# Patient Record
Sex: Male | Born: 1950 | Race: Black or African American | Hispanic: No | Marital: Married | Smoking: Former smoker
Health system: Southern US, Community
[De-identification: ages and names within clinical notes are randomized; demographics above are authoritative.]

## PROBLEM LIST (undated history)

## (undated) DIAGNOSIS — Z9989 Dependence on other enabling machines and devices: Secondary | ICD-10-CM

## (undated) DIAGNOSIS — R2681 Unsteadiness on feet: Secondary | ICD-10-CM

## (undated) DIAGNOSIS — K635 Polyp of colon: Secondary | ICD-10-CM

## (undated) DIAGNOSIS — M21379 Foot drop, unspecified foot: Secondary | ICD-10-CM

## (undated) DIAGNOSIS — M4800 Spinal stenosis, site unspecified: Secondary | ICD-10-CM

## (undated) DIAGNOSIS — I7300 Raynaud's syndrome without gangrene: Secondary | ICD-10-CM

## (undated) DIAGNOSIS — E669 Obesity, unspecified: Secondary | ICD-10-CM

## (undated) DIAGNOSIS — N529 Male erectile dysfunction, unspecified: Secondary | ICD-10-CM

## (undated) DIAGNOSIS — I639 Cerebral infarction, unspecified: Secondary | ICD-10-CM

## (undated) DIAGNOSIS — Z8669 Personal history of other diseases of the nervous system and sense organs: Secondary | ICD-10-CM

## (undated) DIAGNOSIS — I82409 Acute embolism and thrombosis of unspecified deep veins of unspecified lower extremity: Secondary | ICD-10-CM

## (undated) DIAGNOSIS — I77811 Abdominal aortic ectasia: Secondary | ICD-10-CM

## (undated) DIAGNOSIS — I10 Essential (primary) hypertension: Secondary | ICD-10-CM

## (undated) DIAGNOSIS — E785 Hyperlipidemia, unspecified: Secondary | ICD-10-CM

## (undated) DIAGNOSIS — M47812 Spondylosis without myelopathy or radiculopathy, cervical region: Secondary | ICD-10-CM

## (undated) DIAGNOSIS — J984 Other disorders of lung: Secondary | ICD-10-CM

## (undated) DIAGNOSIS — G4733 Obstructive sleep apnea (adult) (pediatric): Secondary | ICD-10-CM

## (undated) DIAGNOSIS — R42 Dizziness and giddiness: Secondary | ICD-10-CM

## (undated) DIAGNOSIS — M1990 Unspecified osteoarthritis, unspecified site: Secondary | ICD-10-CM

## (undated) HISTORY — DX: Polyp of colon: K63.5

## (undated) HISTORY — DX: Abdominal aortic ectasia: I77.811

## (undated) HISTORY — DX: Personal history of other diseases of the nervous system and sense organs: Z86.69

## (undated) HISTORY — DX: Dizziness and giddiness: R42

## (undated) HISTORY — DX: Male erectile dysfunction, unspecified: N52.9

## (undated) HISTORY — DX: Spinal stenosis, site unspecified: M48.00

## (undated) HISTORY — PX: CARPAL TUNNEL RELEASE: SHX101

## (undated) HISTORY — DX: Raynaud's syndrome without gangrene: I73.00

## (undated) HISTORY — DX: Hyperlipidemia, unspecified: E78.5

## (undated) HISTORY — DX: Spondylosis without myelopathy or radiculopathy, cervical region: M47.812

## (undated) HISTORY — DX: Unsteadiness on feet: R26.81

## (undated) HISTORY — DX: Cerebral infarction, unspecified: I63.9

## (undated) HISTORY — DX: Other disorders of lung: J98.4

## (undated) HISTORY — DX: Obesity, unspecified: E66.9

## (undated) HISTORY — DX: Essential (primary) hypertension: I10

## (undated) HISTORY — DX: Dependence on other enabling machines and devices: Z99.89

## (undated) HISTORY — PX: LUMBAR DISC SURGERY: SHX700

## (undated) HISTORY — DX: Obstructive sleep apnea (adult) (pediatric): G47.33

## (undated) HISTORY — DX: Foot drop, unspecified foot: M21.379

## (undated) HISTORY — DX: Unspecified osteoarthritis, unspecified site: M19.90

---

## 2002-04-25 HISTORY — PX: CERVICAL DISCECTOMY: SHX98

## 2006-09-29 ENCOUNTER — Encounter: Admission: RE | Admit: 2006-09-29 | Discharge: 2006-09-29 | Payer: Self-pay | Admitting: Internal Medicine

## 2006-10-18 ENCOUNTER — Encounter: Admission: RE | Admit: 2006-10-18 | Discharge: 2006-10-18 | Payer: Self-pay | Admitting: Specialist

## 2008-04-25 DIAGNOSIS — I639 Cerebral infarction, unspecified: Secondary | ICD-10-CM

## 2008-04-25 HISTORY — DX: Cerebral infarction, unspecified: I63.9

## 2008-07-03 ENCOUNTER — Encounter: Payer: Self-pay | Admitting: Pulmonary Disease

## 2009-05-15 ENCOUNTER — Encounter: Admission: RE | Admit: 2009-05-15 | Discharge: 2009-05-15 | Payer: Self-pay | Admitting: Sports Medicine

## 2009-07-06 DIAGNOSIS — G629 Polyneuropathy, unspecified: Secondary | ICD-10-CM | POA: Insufficient documentation

## 2009-09-01 ENCOUNTER — Encounter: Payer: Self-pay | Admitting: Pulmonary Disease

## 2009-09-14 ENCOUNTER — Encounter: Payer: Self-pay | Admitting: Pulmonary Disease

## 2009-09-14 ENCOUNTER — Ambulatory Visit: Admission: RE | Admit: 2009-09-14 | Discharge: 2009-09-14 | Payer: Self-pay | Admitting: Internal Medicine

## 2009-09-18 ENCOUNTER — Encounter: Admission: RE | Admit: 2009-09-18 | Discharge: 2009-09-18 | Payer: Self-pay | Admitting: Internal Medicine

## 2009-09-18 ENCOUNTER — Encounter: Payer: Self-pay | Admitting: Pulmonary Disease

## 2009-09-22 ENCOUNTER — Ambulatory Visit (HOSPITAL_COMMUNITY): Admission: RE | Admit: 2009-09-22 | Discharge: 2009-09-22 | Payer: Self-pay | Admitting: Cardiology

## 2009-09-22 ENCOUNTER — Encounter: Payer: Self-pay | Admitting: Pulmonary Disease

## 2009-10-05 DIAGNOSIS — D126 Benign neoplasm of colon, unspecified: Secondary | ICD-10-CM | POA: Insufficient documentation

## 2009-10-05 DIAGNOSIS — E1129 Type 2 diabetes mellitus with other diabetic kidney complication: Secondary | ICD-10-CM | POA: Insufficient documentation

## 2009-10-05 DIAGNOSIS — E785 Hyperlipidemia, unspecified: Secondary | ICD-10-CM | POA: Insufficient documentation

## 2009-10-05 DIAGNOSIS — E119 Type 2 diabetes mellitus without complications: Secondary | ICD-10-CM | POA: Insufficient documentation

## 2009-10-05 DIAGNOSIS — N529 Male erectile dysfunction, unspecified: Secondary | ICD-10-CM | POA: Insufficient documentation

## 2009-10-05 DIAGNOSIS — I10 Essential (primary) hypertension: Secondary | ICD-10-CM | POA: Insufficient documentation

## 2009-10-05 DIAGNOSIS — E1169 Type 2 diabetes mellitus with other specified complication: Secondary | ICD-10-CM | POA: Insufficient documentation

## 2009-10-05 DIAGNOSIS — G510 Bell's palsy: Secondary | ICD-10-CM | POA: Insufficient documentation

## 2009-10-05 DIAGNOSIS — I6350 Cerebral infarction due to unspecified occlusion or stenosis of unspecified cerebral artery: Secondary | ICD-10-CM

## 2009-10-06 ENCOUNTER — Ambulatory Visit: Payer: Self-pay | Admitting: Pulmonary Disease

## 2009-10-06 DIAGNOSIS — R0989 Other specified symptoms and signs involving the circulatory and respiratory systems: Secondary | ICD-10-CM

## 2009-10-06 DIAGNOSIS — R0609 Other forms of dyspnea: Secondary | ICD-10-CM | POA: Insufficient documentation

## 2009-10-06 DIAGNOSIS — R930 Abnormal findings on diagnostic imaging of skull and head, not elsewhere classified: Secondary | ICD-10-CM | POA: Insufficient documentation

## 2009-10-06 LAB — CONVERTED CEMR LAB: Angiotensin 1 Converting Enzyme: 62 units/L (ref 9–67)

## 2009-10-08 LAB — CONVERTED CEMR LAB: Sed Rate: 22 mm/hr (ref 0–22)

## 2009-12-04 ENCOUNTER — Encounter: Payer: Self-pay | Admitting: Pulmonary Disease

## 2009-12-08 ENCOUNTER — Telehealth: Payer: Self-pay | Admitting: Pulmonary Disease

## 2010-05-26 NOTE — Progress Notes (Signed)
Summary: nos appt  Phone Note Call from Patient   Caller: [email or web address]  Call For: clance Summary of Call: LMTCB x2 to rsc nos from 8/15. Initial call taken by: Darletta Moll,  December 08, 2009 4:15 PM

## 2010-05-26 NOTE — Cardiovascular Report (Signed)
Summary: [facility]   Imported By: Sherian Rein 10/21/2009 08:59:39  _____________________________________________________________________  External Attachment:    Type:   Image     Comment:   External Document

## 2010-05-26 NOTE — Assessment & Plan Note (Signed)
Summary: consult for dyspnea, abnormal ct chest.   Copy to:  Buren Kos Primary Provider/Referring Provider:  Buren Kos  CC:  Pulmonary Consult.  History of Present Illness: The pt is a 59y/o male who I have been asked to see for dyspnea.  He notes sob since April that he feels came on fairly quickly.  It can occur with exertional activities, and on occasion at rest.  It has not interfered with his ADL's or other activities, but he notices it during that time.  He also has had chest pressure as well.  He does not feel the symptoms are progressive, and in fact have actually become more intermittant.  He has signficantly decreased his smoking since the problem started.  He has had a cath with normal coronaries and nl LV, and echo has shown only moderate LVH.  He has had pfts which revealed no obstructive disease, mild restriction, and a moderate decrease in DLCO that corrects to normal with Av.  He has had a ct chest which revealed a few patchy areas of GG density that are vague, and primarily within the upper lung zones.  There was also bronchial wall thickening.  The pt denies any signficant cough or mucus, and is not congested.  He denies any occupational exposures, unusual pets or birds, or unusual hobbies.  He has had no organic material exposure.  He has no h/o sarcoidosis.    Preventive Screening-Counseling & Management  Alcohol-Tobacco     Smoking Status: current  Current Medications (verified): 1)  Toprol Xl 50 Mg Xr24h-Tab (Metoprolol Succinate) .... Take 1 Tablet By Mouth Once A Day 2)  Furosemide 40 Mg Tabs (Furosemide) .... Take 1 Tablet By Mouth Once A Day 3)  Metformin Hcl 500 Mg Tabs (Metformin Hcl) .... Take 1 Tablet By Mouth Two Times A Day 4)  Vicodin 5-500 Mg Tabs (Hydrocodone-Acetaminophen) .... As Needed 5)  Aspirin Ec Low Dose 81 Mg Tbec (Aspirin) .... 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 mother: DM, alzheimers father: lung disease sister: multiple sclerosis brother: osa  Social History: Reviewed history and no changes required. Patient is a current smoker. started in 57s.  currently smokes 4 to 5 cigs per day. pt is married. pt has children. pt is retired from telephone company Smoking Status:  current  Review of Systems       The patient complains of shortness of breath with activity, shortness of breath at rest, difficulty swallowing, and tooth/dental problems.  The patient denies productive cough, non-productive cough, coughing up blood, chest pain, irregular heartbeats, acid heartburn, indigestion, loss of appetite, weight change, abdominal pain, sore throat, headaches, nasal congestion/difficulty breathing through nose, sneezing, itching, ear ache, anxiety, depression, hand/feet swelling, joint stiffness or pain, rash, change in color of mucus, and fever.    Vital Signs:  Patient profile:   60 year old male Height:      73.5 inches Weight:      295 pounds BMI:     38.53 O2 Sat:      100 % on Room air Temp:     98.1 degrees F oral Pulse rate:  69 / minute BP sitting:   108 / 70  (left arm) Cuff size:   regular  Vitals Entered By: Arman Filter LPN (October 06, 2009 10:34 AM)  O2 Flow:  Room air CC: Pulmonary Consult Comments Medications reviewed with patient. Aundra Millet Reynolds LPN  October 06, 2009 10:44 AM    Physical Exam  General:  obese male in nad Eyes:  PERRLA and EOMI.   Nose:  patent without discharge turbinate hypertrophy Mouth:  clear Neck:  no jvd, tmg, LN Lungs:  totally clear to  auscultation Heart:  rrr, no mrg Abdomen:  soft and nontender, bs+ Extremities:  no edema noted or cyanosis pulses intact distally Neurologic:  alert and oriented, moves all 4   Impression & Recommendations:  Problem # 1:  DYSPNEA ON EXERTION (ICD-786.09) The pt has significant dyspnea that I think is multifactorial.  He is obese and deconditioned, and this could explain his decreased TLC and DLCO on pfts.  Certainly his CT findings contribute to this as well.  I have asked him to work on weight loss and condtioning, and to stay 100% away from cigarettes.  Will follow him for now, but he is to call if symptoms are worsening.  Problem # 2:  CT, CHEST, ABNORMAL (ICD-793.1) the pt has vague GG densities primarily in the upper lung zones that are patchy, and I suspect are inflammatory in nature.  He also has some bronchial wall thickening, but no ISLD or fibrosis.  These are nonspecific findings, and could be related to may different inflammatory conditions. These can include BOOP, RBILD, sarcoid, eosinophilic pna, HP, etc....  The pt feels that his breathing has actually improved since significantly reducing his smoking, and his saturations today are excellent.  Because he is not acutely ill, and he feels things are improving, I would like to hold off on any intervention and see how he does.  I will check ESR and ACE, and have stressed to him the need to totally quit smoking.  He is agreeable to this approach.  Medications Added to Medication List This Visit: 1)  Metformin Hcl 500 Mg Tabs (Metformin hcl) .... Take 1 tablet by mouth two times a day 2)  Vicodin 5-500 Mg Tabs (Hydrocodone-acetaminophen) .... As needed 3)  Aspirin Ec Low Dose 81 Mg Tbec (Aspirin) .... Take 1 tablet by mouth once a day  Other Orders: Consultation Level V (09811) T-Angiotensin i-Converting Enzyme (91478-29562) TLB-Sedimentation Rate (ESR) (85652-ESR)  Patient Instructions: 1)  will check bloodwork to evaluate for  "inflammatory lung disease" 2)  stop smoking 100% 3)  followup with me in 8wks where we will check f/u cxr, but call if symptoms are worsening.

## 2010-05-26 NOTE — Miscellaneous (Signed)
Summary: Orders Update  Clinical Lists Changes  Orders: Added new Test order of T-2 View CXR (71020TC) - Signed 

## 2010-07-12 LAB — POCT I-STAT 3, VENOUS BLOOD GAS (G3P V)
Acid-Base Excess: 1 mmol/L (ref 0.0–2.0)
Bicarbonate: 27 mEq/L — ABNORMAL HIGH (ref 20.0–24.0)
O2 Saturation: 68 %
pCO2, Ven: 48 mmHg (ref 45.0–50.0)
pH, Ven: 7.359 — ABNORMAL HIGH (ref 7.250–7.300)
pO2, Ven: 38 mmHg (ref 30.0–45.0)

## 2011-01-17 ENCOUNTER — Other Ambulatory Visit: Payer: Self-pay | Admitting: Internal Medicine

## 2011-01-17 DIAGNOSIS — J984 Other disorders of lung: Secondary | ICD-10-CM

## 2011-01-17 DIAGNOSIS — M545 Low back pain, unspecified: Secondary | ICD-10-CM | POA: Insufficient documentation

## 2011-01-21 ENCOUNTER — Ambulatory Visit (HOSPITAL_COMMUNITY)
Admission: RE | Admit: 2011-01-21 | Discharge: 2011-01-21 | Disposition: A | Discharge status: Home / Self Care | Payer: Medicare Other | Source: Ambulatory Visit | Attending: Internal Medicine | Admitting: Internal Medicine

## 2011-01-21 ENCOUNTER — Ambulatory Visit
Admission: RE | Admit: 2011-01-21 | Discharge: 2011-01-21 | Disposition: A | Discharge status: Home / Self Care | Payer: Medicare Other | Source: Ambulatory Visit | Attending: Internal Medicine | Admitting: Internal Medicine

## 2011-01-21 DIAGNOSIS — J984 Other disorders of lung: Secondary | ICD-10-CM | POA: Insufficient documentation

## 2011-03-29 ENCOUNTER — Encounter: Payer: Self-pay | Admitting: Internal Medicine

## 2011-03-31 ENCOUNTER — Other Ambulatory Visit: Payer: Medicare Other | Admitting: Internal Medicine

## 2011-04-22 ENCOUNTER — Ambulatory Visit (AMBULATORY_SURGERY_CENTER): Payer: Medicare Other | Admitting: *Deleted

## 2011-04-22 VITALS — Ht 76.0 in | Wt 304.2 lb

## 2011-04-22 DIAGNOSIS — Z1211 Encounter for screening for malignant neoplasm of colon: Secondary | ICD-10-CM

## 2011-04-22 MED ORDER — PEG-KCL-NACL-NASULF-NA ASC-C 100 G PO SOLR: ORAL | Status: DC

## 2011-04-22 NOTE — Progress Notes (Signed)
Pt had screening colonoscopy 2006 in [place], [place].  Received records from that procedure and will give to Alonna Buckler, CMA for Dr. Marina Goodell to review.  Rodney Crane

## 2011-05-03 ENCOUNTER — Ambulatory Visit (AMBULATORY_SURGERY_CENTER): Payer: Medicare Other | Admitting: Internal Medicine

## 2011-05-03 ENCOUNTER — Encounter: Payer: Self-pay | Admitting: Internal Medicine

## 2011-05-03 VITALS — BP 134/88 | HR 59 | Temp 96.1°F | Resp 18 | Ht 76.0 in | Wt 304.0 lb

## 2011-05-03 DIAGNOSIS — Z86010 Personal history of colonic polyps: Secondary | ICD-10-CM

## 2011-05-03 DIAGNOSIS — Z1211 Encounter for screening for malignant neoplasm of colon: Secondary | ICD-10-CM

## 2011-05-03 DIAGNOSIS — D126 Benign neoplasm of colon, unspecified: Secondary | ICD-10-CM

## 2011-05-03 DIAGNOSIS — F411 Generalized anxiety disorder: Secondary | ICD-10-CM | POA: Diagnosis not present

## 2011-05-03 DIAGNOSIS — K635 Polyp of colon: Secondary | ICD-10-CM

## 2011-05-03 LAB — GLUCOSE, CAPILLARY: Glucose-Capillary: 153 mg/dL — ABNORMAL HIGH (ref 70–99)

## 2011-05-03 MED ORDER — SODIUM CHLORIDE 0.9 % IV SOLN: 500.0000 mL | INTRAVENOUS | Status: DC

## 2011-05-03 NOTE — Op Note (Signed)
[facility] [address] [place], [place]  [address]  COLONOSCOPY PROCEDURE REPORT  PATIENT:  Rodney Crane, Rodney Crane  MR#:  [id number] BIRTHDATE:  10-23-1950, 60 yrs. old  GENDER:  male ENDOSCOPIST:  Wilhemina Bonito. Eda Keys, MD REF. BY:  Kari Baars, M.D. PROCEDURE DATE:  05/03/2011 PROCEDURE:  Colonoscopy with snare polypectomy x 6 ASA CLASS:  Class II INDICATIONS:  history of pre-cancerous (adenomatous) colon polyps, surveillance and high-risk screening ; index exam [name], [place] [phone] w/ TA MEDICATIONS:   MAC sedation, administered by CRNA, propofol (Diprivan) 300 mg IV  DESCRIPTION OF PROCEDURE:   After the risks benefits and alternatives of the procedure were thoroughly explained, informed consent was obtained.  Digital rectal exam was performed and revealed no abnormalities.   The [id number] endoscope was introduced through the anus and advanced to the cecum, which was identified by both the appendix and ileocecal valve, without limitations.  The quality of the prep was excellent, using MoviPrep.  The instrument was then slowly withdrawn as the colon was fully examined. <<PROCEDUREIMAGES>>  FINDINGS: There were multiple polyps, all < 5mm, identified and removed -cecal (1); hepatic flexure (2); transverse (2); and sigmoid Polyps were snared without cautery. Retrieval was successful in 5/6. Mild diverticulosis was found in the sigmoid colon. Otherwise normal colonoscopy without other polyps, masses, vascular ectasias, or inflammatory changes.   Retroflexed views in the rectum revealed no abnormalities. The time to cecum = 5:15 minutes. The scope was then withdrawn in 15:07 minutes from the cecum and the procedure completed.  COMPLICATIONS:  None  ENDOSCOPIC IMPRESSION: 1) Polyps, multiple small - removed 2) Mild diverticulosis in the sigmoid colon 3) Otherwise normal colonoscopy  RECOMMENDATIONS: 1) Follow up colonoscopy in 5  years  ______________________________ Wilhemina Bonito. Eda Keys, MD  CC:  Kari Baars, MD;  The Patient  n. eSIGNED:   Wilhemina Bonito. Eda Keys at 05/03/2011 10:24 AM  Karsten Fells, [phone]

## 2011-05-03 NOTE — Progress Notes (Signed)
Patient did not experience any of the following events: a burn prior to discharge; a fall within the facility; wrong site/side/patient/procedure/implant event; or a hospital transfer or hospital admission upon discharge from the facility. (G8907) Patient did not have preoperative order for IV antibiotic SSI prophylaxis. (G8918)  

## 2011-05-03 NOTE — Patient Instructions (Signed)
Polyps removed today. You will receive a letter from Dr. Marina Goodell in 7-10days (usually); if you do not by 3 weeks please call us at [phone].

## 2011-05-04 ENCOUNTER — Telehealth: Payer: Self-pay | Admitting: *Deleted

## 2011-05-04 NOTE — Telephone Encounter (Signed)

## 2011-05-09 ENCOUNTER — Encounter: Payer: Self-pay | Admitting: Internal Medicine

## 2011-06-01 DIAGNOSIS — I10 Essential (primary) hypertension: Secondary | ICD-10-CM | POA: Diagnosis not present

## 2011-06-01 DIAGNOSIS — E1149 Type 2 diabetes mellitus with other diabetic neurological complication: Secondary | ICD-10-CM | POA: Diagnosis not present

## 2011-06-01 DIAGNOSIS — E785 Hyperlipidemia, unspecified: Secondary | ICD-10-CM | POA: Diagnosis not present

## 2011-06-01 DIAGNOSIS — G589 Mononeuropathy, unspecified: Secondary | ICD-10-CM | POA: Diagnosis not present

## 2011-12-16 DIAGNOSIS — E1149 Type 2 diabetes mellitus with other diabetic neurological complication: Secondary | ICD-10-CM | POA: Diagnosis not present

## 2011-12-16 DIAGNOSIS — E785 Hyperlipidemia, unspecified: Secondary | ICD-10-CM | POA: Diagnosis not present

## 2011-12-16 DIAGNOSIS — G589 Mononeuropathy, unspecified: Secondary | ICD-10-CM | POA: Diagnosis not present

## 2011-12-16 DIAGNOSIS — I10 Essential (primary) hypertension: Secondary | ICD-10-CM | POA: Diagnosis not present

## 2012-02-16 DIAGNOSIS — Z23 Encounter for immunization: Secondary | ICD-10-CM | POA: Diagnosis not present

## 2012-02-24 DIAGNOSIS — E1149 Type 2 diabetes mellitus with other diabetic neurological complication: Secondary | ICD-10-CM | POA: Diagnosis not present

## 2012-02-24 DIAGNOSIS — Z125 Encounter for screening for malignant neoplasm of prostate: Secondary | ICD-10-CM | POA: Diagnosis not present

## 2012-02-24 DIAGNOSIS — I10 Essential (primary) hypertension: Secondary | ICD-10-CM | POA: Diagnosis not present

## 2012-02-24 DIAGNOSIS — E785 Hyperlipidemia, unspecified: Secondary | ICD-10-CM | POA: Diagnosis not present

## 2012-03-09 ENCOUNTER — Encounter: Payer: Self-pay | Admitting: Cardiology

## 2012-03-16 DIAGNOSIS — I10 Essential (primary) hypertension: Secondary | ICD-10-CM | POA: Diagnosis not present

## 2012-03-16 DIAGNOSIS — E1149 Type 2 diabetes mellitus with other diabetic neurological complication: Secondary | ICD-10-CM | POA: Diagnosis not present

## 2012-03-16 DIAGNOSIS — E785 Hyperlipidemia, unspecified: Secondary | ICD-10-CM | POA: Diagnosis not present

## 2012-03-16 DIAGNOSIS — Z0000 Encounter for general adult medical examination without abnormal findings: Secondary | ICD-10-CM | POA: Diagnosis not present

## 2012-03-16 DIAGNOSIS — Z87891 Personal history of nicotine dependence: Secondary | ICD-10-CM | POA: Diagnosis not present

## 2012-03-16 DIAGNOSIS — Z125 Encounter for screening for malignant neoplasm of prostate: Secondary | ICD-10-CM | POA: Diagnosis not present

## 2012-03-16 DIAGNOSIS — G589 Mononeuropathy, unspecified: Secondary | ICD-10-CM | POA: Diagnosis not present

## 2012-04-10 DIAGNOSIS — E119 Type 2 diabetes mellitus without complications: Secondary | ICD-10-CM | POA: Diagnosis not present

## 2012-04-10 DIAGNOSIS — H04129 Dry eye syndrome of unspecified lacrimal gland: Secondary | ICD-10-CM | POA: Diagnosis not present

## 2012-04-10 DIAGNOSIS — H2510 Age-related nuclear cataract, unspecified eye: Secondary | ICD-10-CM | POA: Diagnosis not present

## 2012-04-10 DIAGNOSIS — H35039 Hypertensive retinopathy, unspecified eye: Secondary | ICD-10-CM | POA: Diagnosis not present

## 2012-06-14 ENCOUNTER — Encounter: Payer: Self-pay | Admitting: Cardiology

## 2012-07-10 DIAGNOSIS — M2040 Other hammer toe(s) (acquired), unspecified foot: Secondary | ICD-10-CM | POA: Diagnosis not present

## 2012-07-10 DIAGNOSIS — B351 Tinea unguium: Secondary | ICD-10-CM | POA: Diagnosis not present

## 2012-07-10 DIAGNOSIS — M79609 Pain in unspecified limb: Secondary | ICD-10-CM | POA: Diagnosis not present

## 2012-07-30 DIAGNOSIS — R209 Unspecified disturbances of skin sensation: Secondary | ICD-10-CM | POA: Diagnosis not present

## 2012-07-30 DIAGNOSIS — E669 Obesity, unspecified: Secondary | ICD-10-CM | POA: Diagnosis not present

## 2012-07-30 DIAGNOSIS — E1149 Type 2 diabetes mellitus with other diabetic neurological complication: Secondary | ICD-10-CM | POA: Diagnosis not present

## 2012-07-30 DIAGNOSIS — I10 Essential (primary) hypertension: Secondary | ICD-10-CM | POA: Diagnosis not present

## 2012-07-30 DIAGNOSIS — R404 Transient alteration of awareness: Secondary | ICD-10-CM | POA: Diagnosis not present

## 2012-07-30 DIAGNOSIS — E785 Hyperlipidemia, unspecified: Secondary | ICD-10-CM | POA: Diagnosis not present

## 2012-08-06 DIAGNOSIS — I6789 Other cerebrovascular disease: Secondary | ICD-10-CM | POA: Diagnosis not present

## 2012-08-06 DIAGNOSIS — R209 Unspecified disturbances of skin sensation: Secondary | ICD-10-CM | POA: Diagnosis not present

## 2012-08-28 DIAGNOSIS — M961 Postlaminectomy syndrome, not elsewhere classified: Secondary | ICD-10-CM | POA: Diagnosis not present

## 2012-09-29 DIAGNOSIS — M961 Postlaminectomy syndrome, not elsewhere classified: Secondary | ICD-10-CM | POA: Diagnosis not present

## 2012-10-09 DIAGNOSIS — M961 Postlaminectomy syndrome, not elsewhere classified: Secondary | ICD-10-CM | POA: Diagnosis not present

## 2012-10-23 DIAGNOSIS — M961 Postlaminectomy syndrome, not elsewhere classified: Secondary | ICD-10-CM | POA: Diagnosis not present

## 2012-10-31 DIAGNOSIS — M961 Postlaminectomy syndrome, not elsewhere classified: Secondary | ICD-10-CM | POA: Diagnosis not present

## 2012-12-19 DIAGNOSIS — M961 Postlaminectomy syndrome, not elsewhere classified: Secondary | ICD-10-CM | POA: Diagnosis not present

## 2013-02-12 DIAGNOSIS — Z23 Encounter for immunization: Secondary | ICD-10-CM | POA: Diagnosis not present

## 2013-02-14 DIAGNOSIS — M961 Postlaminectomy syndrome, not elsewhere classified: Secondary | ICD-10-CM | POA: Diagnosis not present

## 2013-03-06 DIAGNOSIS — M961 Postlaminectomy syndrome, not elsewhere classified: Secondary | ICD-10-CM | POA: Diagnosis not present

## 2013-04-12 ENCOUNTER — Ambulatory Visit (INDEPENDENT_AMBULATORY_CARE_PROVIDER_SITE_OTHER): Payer: Medicare Other | Admitting: Neurology

## 2013-04-12 ENCOUNTER — Encounter (INDEPENDENT_AMBULATORY_CARE_PROVIDER_SITE_OTHER): Payer: Self-pay

## 2013-04-12 ENCOUNTER — Encounter: Payer: Self-pay | Admitting: Neurology

## 2013-04-12 VITALS — BP 190/99 | HR 68 | Temp 96.9°F | Ht 73.0 in | Wt 307.0 lb

## 2013-04-12 DIAGNOSIS — E119 Type 2 diabetes mellitus without complications: Secondary | ICD-10-CM

## 2013-04-12 DIAGNOSIS — G4733 Obstructive sleep apnea (adult) (pediatric): Secondary | ICD-10-CM | POA: Diagnosis not present

## 2013-04-12 DIAGNOSIS — G609 Hereditary and idiopathic neuropathy, unspecified: Secondary | ICD-10-CM

## 2013-04-12 DIAGNOSIS — G8929 Other chronic pain: Secondary | ICD-10-CM

## 2013-04-12 DIAGNOSIS — M545 Low back pain: Secondary | ICD-10-CM

## 2013-04-12 DIAGNOSIS — Z8673 Personal history of transient ischemic attack (TIA), and cerebral infarction without residual deficits: Secondary | ICD-10-CM

## 2013-04-12 NOTE — Progress Notes (Signed)
Subjective:    Patient ID: Rodney Crane is a 62 y.o. male.  HPI    Rodney Foley, MD, PhD [facility] [address] [address] [place], [place] [address]   Dear Dr. Clelia Croft,   I saw your patient, Rodney Crane, upon your kind request in my neurologic clinic today for initial consultation of his sleep disorder, in particular, concern for structural sleep apnea. The patient is accompanied by his wife today. As you know, Rodney Crane is a very friendly 62 year old right-handed gentleman with an underlying medical history of hypertension, hyperlipidemia, type 2 diabetes, degenerative back disease, s/p 7 back surgeris, and stroke (incidental finding on an MRI per patient), who complains of snoring and excessive daytime somnolence. He still smokes about 3 cig/day.  His typical bedtime is reported to be around 11:30 to MN and usual wake time is around 7 AM. Sleep onset typically occurs within a few minutes. He reports feeling marginally rested upon awakening. He wakes up on an average 3 times in the middle of the night and has to go to the bathroom 0 to 1 times on a typical night. He admits to occasional morning headaches. This is a dull, achy bifrontal, facial headache.   He reports excessive daytime somnolence (EDS) and His Epworth Sleepiness Score (ESS) is 5/24 today. He has not fallen asleep while driving. The patient has not been taking a planned nap.  He has been known to snore for the past many years. Snoring is reportedly moderate, and associated with choking sounds and witnessed apneas. The patient admits to a sense of choking or strangling feeling. There is no report of nighttime reflux, with rare nighttime cough experienced. The patient has not noted any RLS symptoms and is not known to kick while asleep or before falling asleep. There is family history of OSA in his brother.  He is a restless sleeper and in the morning, the bed is quite disheveled.   He denies  cataplexy, sleep paralysis, hypnagogic or hypnopompic hallucinations, or sleep attacks. He does not report any vivid dreams, nightmares, dream enactments, or parasomnias, such as sleep talking or sleep walking. The patient has not had a sleep study or a home sleep test.  He consumes 2 caffeinated beverages per day, usually in the form of coffee in the mornings.   His bedroom is usually dark and cool. There is a TV in the bedroom and usually it is not on at night.   His Past Medical History Is Significant For: Past Medical History  Diagnosis Date  . Arthritis   . Diabetes mellitus   . Hyperlipidemia   . Hypertension   . CVA (cerebral infarction) 2010    His Past Surgical History Is Significant For: Past Surgical History  Procedure Laterality Date  . Lumbar disc surgery      x6  . Cervical discectomy  2004  . Carpal tunnel release      bilateral    His Family History Is Significant For: Family History  Problem Relation Age of Onset  . Colon cancer Neg Hx   . Stomach cancer Neg Hx   . Multiple sclerosis Sister   . Dementia Mother   . Cancer Father     His Social History Is Significant For: History   Social History  . Marital Status: Married    Spouse Name: N/A    Number of Children: N/A  . Years of Education: N/A   Social History Main Topics  . Smoking  status: Former Smoker    Quit date: 01/21/2011  . Smokeless tobacco: Never Used  . Alcohol Use: No  . Drug Use: No  . Sexual Activity: None   Other Topics Concern  . None   Social History Narrative  . None    His Allergies Are:  No Known Allergies:   His Current Medications Are:  Outpatient Encounter Prescriptions as of 04/12/2013  Medication Sig  . aspirin 81 MG tablet Take 81 mg by mouth daily.    Marland Kitchen atorvastatin (LIPITOR) 20 MG tablet Take 20 mg by mouth daily.  Marland Kitchen DIOVAN 160 MG tablet Take 160 mg by mouth daily.   . metFORMIN (GLUCOPHAGE) 500 MG tablet Take 500 mg by mouth 2 (two) times daily with a  meal.   . metoprolol (TOPROL XL) 50 MG 24 hr tablet Take 50 mg by mouth daily.    . naproxen sodium (ANAPROX) 220 MG tablet Take 220 mg by mouth 2 (two) times daily with a meal.  . pregabalin (LYRICA) 75 MG capsule Take 75 mg by mouth daily.  . traMADol (ULTRAM) 50 MG tablet Take 25 mg by mouth at bedtime.  . [DISCONTINUED] CRESTOR 10 MG tablet Take 10 mg by mouth daily. Takes 1/2 tablet daily  :  Review of Systems:  Out of a complete 14 point review of systems, all are reviewed and negative with the exception of these symptoms as listed below:   Review of Systems  HENT: Positive for trouble swallowing.   Eyes: Positive for visual disturbance (blurred).  Respiratory: Negative.   Cardiovascular: Negative.   Gastrointestinal: Positive for constipation.  Endocrine: Negative.   Genitourinary: Negative.        Impotence  Musculoskeletal: Positive for myalgias.  Skin: Negative.   Neurological: Positive for weakness and numbness.  Hematological: Negative.   Psychiatric/Behavioral: Positive for sleep disturbance (snoring).    Objective:  Neurologic Exam  Physical Exam Physical Examination:   Filed Vitals:   04/12/13 0951  BP: 190/99  Pulse: 68  Temp: 96.9 F (36.1 C)    General Examination: The patient is a very pleasant 62 y.o. male in no acute distress. He appears well-developed and well-nourished and very well groomed. He is overweight.   HEENT: Normocephalic, atraumatic, pupils are equal, round and reactive to light and accommodation. Funduscopic exam is normal with sharp disc margins noted. Extraocular tracking is good without limitation to gaze excursion or nystagmus noted. Normal smooth pursuit is noted. Hearing is grossly intact. Tympanic membranes are clear bilaterally. Face is symmetric with normal facial animation and normal facial sensation. Speech is clear with no dysarthria noted. There is no hypophonia. There is no lip, neck/head, jaw or voice tremor. Neck is supple  with full range of passive and active motion. There are no carotid bruits on auscultation. Oropharynx exam reveals: mild mouth dryness, good dental hygiene and moderate airway crowding, due to redundant soft palate and floppy, wider based uvula. Mallampati is class III. Tongue protrudes centrally and palate elevates symmetrically. Tonsils are either absent or very small. Neck size is 17 inches.   Chest: Clear to auscultation without wheezing, rhonchi or crackles noted.  Heart: S1+S2+0, regular and normal without murmurs, rubs or gallops noted.   Abdomen: Soft, non-tender and non-distended with normal bowel sounds appreciated on auscultation.  Extremities: There is no pitting edema in the distal lower extremities bilaterally. Pedal pulses are intact. His right leg is smaller in caliber than the L.   Skin: Warm and dry without trophic  changes noted. There are no varicose veins.  Musculoskeletal: exam reveals no obvious joint deformities, tenderness or joint swelling or erythema.   Neurologically:  Mental status: The patient is awake, alert and oriented in all 4 spheres. His memory, attention, language and knowledge are appropriate. There is no aphasia, agnosia, apraxia or anomia. Speech is clear with normal prosody and enunciation. Thought process is linear. Mood is congruent and affect is normal.  Cranial nerves are as described above under HEENT exam. In addition, shoulder shrug is normal with equal shoulder height noted. Motor exam: Normal bulk, strength and tone is noted, with the exception of mild weakness in the R foot dorsiflexion. There is no drift, tremor or rebound. Romberg is positive. Reflexes are 1+ throughout. Toes are downgoing bilaterally. Fine motor skills are intact with normal finger taps, normal hand movements, normal rapid alternating patting, normal foot taps and normal foot agility.  Cerebellar testing shows no dysmetria or intention tremor on finger to nose testing. Heel to  shin is unremarkable bilaterally. There is no truncal or gait ataxia.  Sensory exam is intact to light touch, pinprick, vibration, temperature sense in the upper and lower extremities, with the exception of decrease in PP and temperature sense in the RLE.  Gait, station and balance: He has loss of lumbar lordosis and has a mildly stooped posture. No veering to one side is noted. No leaning to one side is noted. Posture is age-appropriate and stance is narrow based. No problems turning are noted. He turns en bloc. Tandem walk is difficult for him. He cannot do toe or heel stance.               Assessment and Plan:   In summary, Rodney Crane is a very pleasant 62 y.o.-year old male with a history and physical exam concerning for obstructive sleep apnea (OSA). In light of his an underlying medical history of hypertension, hyperlipidemia, type 2 diabetes, degenerative back disease, s/p 7 back surgeris, and stroke and his smoking, he has several cardiovascular risk factors. He has evidence of decrease in sensation in the right lower extremity which is also smaller in size and he has noticed this since his multiple back surgeries. I had a long chat with the patient about my findings and the diagnosis, its prognosis and treatment options. We talked about medical treatments and non-pharmacological approaches. I explained in particular the risks and ramifications of untreated moderate to severe OSA, especially with respect to developing cardiovascular disease down the Road, including congestive heart failure, difficult to treat hypertension, cardiac arrhythmias, or stroke. Even type 2 diabetes has in part been linked to untreated OSA. We talked about smoking cessation and trying to maintain a healthy lifestyle in general, as well as the importance of weight control. I encouraged the patient to eat healthy, exercise daily and keep well hydrated, to keep a scheduled bedtime and wake time routine, to not skip any meals  and eat healthy snacks in between meals.  I recommended the following at this time: sleep study with potential positive airway pressure titration.  I explained the sleep test procedure to the patient and also outlined possible surgical and non-surgical treatment options of OSA, including the use of a custom-made dental device, upper airway surgical options, such as pillar implants, radiofrequency surgery, tongue base surgery, and UPPP. I also explained the CPAP treatment option to the patient, who indicated that he would be willing to try CPAP if the need arises. I explained the importance of being  compliant with PAP treatment, not only for insurance purposes but primarily to improve His symptoms, and for the patient's long term health benefit, including to reduce His cardiovascular risks. I answered all his questions today and the patient was in agreement. I would like to see him back after the sleep study is completed and encouraged him to call with any interim questions, concerns, problems or updates.   Thank you very much for allowing me to participate in the care of this nice patient. If I can be of any further assistance to you please do not hesitate to call me at [phone].  Sincerely,   Rodney Foley, MD, PhD

## 2013-04-12 NOTE — Patient Instructions (Signed)

## 2013-05-07 DIAGNOSIS — M961 Postlaminectomy syndrome, not elsewhere classified: Secondary | ICD-10-CM | POA: Diagnosis not present

## 2013-05-07 DIAGNOSIS — M545 Low back pain, unspecified: Secondary | ICD-10-CM | POA: Diagnosis not present

## 2013-05-28 ENCOUNTER — Ambulatory Visit (INDEPENDENT_AMBULATORY_CARE_PROVIDER_SITE_OTHER): Payer: Medicare Other

## 2013-05-28 DIAGNOSIS — G8929 Other chronic pain: Secondary | ICD-10-CM

## 2013-05-28 DIAGNOSIS — G479 Sleep disorder, unspecified: Secondary | ICD-10-CM | POA: Diagnosis not present

## 2013-05-28 DIAGNOSIS — G609 Hereditary and idiopathic neuropathy, unspecified: Secondary | ICD-10-CM

## 2013-05-28 DIAGNOSIS — M545 Low back pain, unspecified: Secondary | ICD-10-CM

## 2013-05-28 DIAGNOSIS — E119 Type 2 diabetes mellitus without complications: Secondary | ICD-10-CM

## 2013-05-28 DIAGNOSIS — G4733 Obstructive sleep apnea (adult) (pediatric): Secondary | ICD-10-CM | POA: Diagnosis not present

## 2013-05-31 ENCOUNTER — Telehealth: Payer: Self-pay | Admitting: Neurology

## 2013-05-31 DIAGNOSIS — G4733 Obstructive sleep apnea (adult) (pediatric): Secondary | ICD-10-CM

## 2013-05-31 NOTE — Telephone Encounter (Signed)
Please call and notify patient that the recent sleep study confirmed the diagnosis of OSA. He did well with CPAP during the study with significant improvement of the respiratory events. Therefore, I would like start the patient on CPAP at home. I placed the order in the chart.   Arrange for CPAP set up at home through a DME company of patient's choice and fax/route report to PCP and referring MD (if other than PCP).   The patient will also need a follow up appointment with me in 6-8 weeks post set up that has to be scheduled; help the patient schedule this (in a follow-up slot).   Please re-enforce the importance of compliance with treatment and the need for us to monitor compliance data.   Once you have spoken to the patient and scheduled the return appointment, you may close this encounter, thanks,   Othello Sgroi, MD, PhD [facility] ([facility])    

## 2013-06-03 ENCOUNTER — Encounter: Payer: Self-pay | Admitting: *Deleted

## 2013-06-03 NOTE — Telephone Encounter (Signed)
I called and spoke with the patient about his recent sleep study results. I informed the patient that the study confirm the diagnosis of obstructive sleep apnea, and he did well on CPAP during the night of his study. I informed the patient that Dr. Rexene Alberts recommends CPAP therapy at home and I will send his order to [facility] and they'll contact him once they receive benefits for the CPAP machine. I will fax a copy of the report to Dr. Raul Del office and mail a copy of the report along with a follow up instruction letter to the patient's home.

## 2013-06-19 ENCOUNTER — Encounter: Payer: Self-pay | Admitting: Neurology

## 2013-07-09 DIAGNOSIS — E119 Type 2 diabetes mellitus without complications: Secondary | ICD-10-CM | POA: Diagnosis not present

## 2013-07-09 DIAGNOSIS — H524 Presbyopia: Secondary | ICD-10-CM | POA: Diagnosis not present

## 2013-07-09 DIAGNOSIS — H35039 Hypertensive retinopathy, unspecified eye: Secondary | ICD-10-CM | POA: Diagnosis not present

## 2013-07-09 DIAGNOSIS — H2510 Age-related nuclear cataract, unspecified eye: Secondary | ICD-10-CM | POA: Diagnosis not present

## 2013-07-10 ENCOUNTER — Encounter: Payer: Self-pay | Admitting: Neurology

## 2013-07-10 NOTE — Progress Notes (Signed)
Quick Note:  I reviewed the patient's CPAP compliance data from 06/09/2013 to 07/08/2013, which is a total of 30 days, during which time the patient used CPAP every day except for 1 day. The average usage for all days was 7 hours and 44 minutes. The percent used days greater than 4 hours was 97 %, indicating excellent compliance. The residual AHI was 1.3 per hour, indicating an appropriate treatment pressure of 7 cwp with EPR of 3. I will review this data with the patient at the next office visit, provide feedback and additional troubleshooting if need be. He is currently scheduled to see me on 07/15/2013 at 9 AM.  Star Age, MD, PhD [facility] ([place])   ______

## 2013-07-15 ENCOUNTER — Encounter (INDEPENDENT_AMBULATORY_CARE_PROVIDER_SITE_OTHER): Payer: Self-pay

## 2013-07-15 ENCOUNTER — Encounter: Payer: Self-pay | Admitting: Neurology

## 2013-07-15 ENCOUNTER — Ambulatory Visit (INDEPENDENT_AMBULATORY_CARE_PROVIDER_SITE_OTHER): Payer: Medicare Other | Admitting: Neurology

## 2013-07-15 VITALS — BP 144/85 | HR 58 | Temp 97.6°F | Ht 74.0 in | Wt 311.0 lb

## 2013-07-15 DIAGNOSIS — E119 Type 2 diabetes mellitus without complications: Secondary | ICD-10-CM

## 2013-07-15 DIAGNOSIS — G609 Hereditary and idiopathic neuropathy, unspecified: Secondary | ICD-10-CM | POA: Diagnosis not present

## 2013-07-15 DIAGNOSIS — M545 Low back pain, unspecified: Secondary | ICD-10-CM

## 2013-07-15 DIAGNOSIS — G8929 Other chronic pain: Secondary | ICD-10-CM

## 2013-07-15 DIAGNOSIS — Z8673 Personal history of transient ischemic attack (TIA), and cerebral infarction without residual deficits: Secondary | ICD-10-CM | POA: Diagnosis not present

## 2013-07-15 DIAGNOSIS — G4733 Obstructive sleep apnea (adult) (pediatric): Secondary | ICD-10-CM

## 2013-07-15 DIAGNOSIS — E669 Obesity, unspecified: Secondary | ICD-10-CM

## 2013-07-15 NOTE — Patient Instructions (Signed)
Please continue using your CPAP regularly. While your insurance requires that you use CPAP at least 4 hours each night on 70% of the nights, I recommend, that you not skip any nights and use it throughout the night if you can. Getting used to CPAP does take time and patience and discipline. Untreated obstructive sleep apnea when it is moderate to severe can have an adverse impact on cardiovascular health and raise her risk for heart disease, arrhythmias, hypertension, congestive heart failure, stroke and diabetes. Untreated obstructive sleep apnea causes sleep disruption, nonrestorative sleep, and sleep deprivation. This can have an impact on your day to day functioning and cause daytime sleepiness and impairment of cognitive function, memory loss, mood disturbance, and problems focussing. Using CPAP regularly can improve these symptoms.  Keep up the good work! I will see you back in 6 months for sleep apnea check up, and if you continue to do well on CPAP I will see you once a year thereafter.   Please use a nasal saline spray to moisturize your nose.

## 2013-07-15 NOTE — Progress Notes (Signed)
Subjective:    Patient ID: Rodney Crane is a 63 y.o. male.  HPI   Interim history:   Rodney Crane is a very friendly 63 year old right-handed gentleman with an underlying medical history of hypertension, smoking, hyperlipidemia, type 2 diabetes, degenerative back disease, s/p 7 back surgeris, and stroke (incidental finding on an MRI per patient), who presents for followup consultation of his obstructive sleep apnea. He is unaccompanied today. The first met him on 04/12/2013, at which time he complained of snoring and excessive daytime somnolence. I suggested he have a sleep study. He had a split-night sleep study on 05/28/2013 and went over his test results with him in detail today. His baseline sleep efficiency was reduced at 68.1% with a latency to sleep of 33 minutes and wake after sleep onset of 46.5 minutes with moderate to severe sleep fragmentation noted. His arousal index was mildly elevated. He had an increased percentage of stage I and stage II sleep, absence of slow-wave sleep, and a reduced percentage of REM sleep with a mildly prolonged REM latency. He had no significant EKG changes or periodic leg movements of sleep. He had moderate to loud snoring. He had a total AHI of 15.2 per hour, rising to 38 per hour in REM sleep. His baseline oxygen saturation was 95% with a nadir of 78% during REM sleep. He was then titrated on CPAP. His sleep efficiency improved. His arousal index improved. His sleep architecture showed an increased percentage of stage II sleep, absence of slow-wave sleep and a reduced percentage of REM sleep. Average oxygen saturation was 96%, nadir was 92%, greatly improved. He was titrated on CPAP from 5-7 cm with a reduction of his AHI to 0 per hour on the final pressure. He had REM sleep but no supine sleep was achieved during this part of the study. He indicated that he does not sleep on his back. He is status post multiple back operations. Based on the test results I  prescribed CPAP for him. I  reviewed the patient's CPAP compliance data from 06/09/2013 to 07/08/2013, which is a total of 30 days, during which time the patient used CPAP every day except for 1 day. The average usage for all days was 7 hours and 44 minutes. The percent used days greater than 4 hours was 97 %, indicating excellent compliance. The residual AHI was 1.3 per hour, indicating an appropriate treatment pressure of 7 cwp with EPR of 3.  Today, I reviewed his compliance data of with his machine: 06/10/2013 through 07/14/2013 which is a total of 35 days during which time use CPAP every night with a percent used days greater than 4 hours of 100%, indicating excellent compliance, average usage was 7 hours and 47 minutes, residual AHI low at 1.3 per hour and leak was low at 1.4. Pressure was 7 cm with EPR of 3. Today, he reports having adjusted well to CPAP, but he has nasal congestion. He has had some nasal dryness. He feels fairly well. He has no new complaints. His wife tells him he sleeps better.   His Past Medical History Is Significant For: Past Medical History  Diagnosis Date  . Arthritis   . Diabetes mellitus   . Hyperlipidemia   . Hypertension   . CVA (cerebral infarction) 2010    His Past Surgical History Is Significant For: Past Surgical History  Procedure Laterality Date  . Lumbar disc surgery      x6  . Cervical discectomy  2004  . Carpal  tunnel release      bilateral    His Family History Is Significant For: Family History  Problem Relation Age of Onset  . Colon cancer Neg Hx   . Stomach cancer Neg Hx   . Multiple sclerosis Sister   . Dementia Mother   . Cancer Father     His Social History Is Significant For: History   Social History  . Marital Status: Married    Spouse Name: N/A    Number of Children: N/A  . Years of Education: N/A   Social History Main Topics  . Smoking status: Former Smoker    Quit date: 01/21/2011  . Smokeless tobacco: Never Used   . Alcohol Use: No  . Drug Use: No  . Sexual Activity: None   Other Topics Concern  . None   Social History Narrative  . None    His Allergies Are:  No Known Allergies:   His Current Medications Are:  Outpatient Encounter Prescriptions as of 07/15/2013  Medication Sig  . aspirin 81 MG tablet Take 81 mg by mouth daily.    Marland Kitchen atorvastatin (LIPITOR) 20 MG tablet Take 20 mg by mouth daily.  Marland Kitchen DIOVAN 160 MG tablet Take 160 mg by mouth daily.   . metFORMIN (GLUCOPHAGE) 500 MG tablet Take 500 mg by mouth 2 (two) times daily with a meal.   . metoprolol (TOPROL XL) 50 MG 24 hr tablet Take 50 mg by mouth daily.    . pregabalin (LYRICA) 75 MG capsule Take 75 mg by mouth daily.  . traMADol (ULTRAM) 50 MG tablet Take 25 mg by mouth at bedtime.  . [DISCONTINUED] naproxen sodium (ANAPROX) 220 MG tablet Take 220 mg by mouth 2 (two) times daily with a meal.  :  Review of Systems:  Out of a complete 14 point review of systems, all are reviewed and negative with the exception of these symptoms as listed below:  Review of Systems  Constitutional: Negative.   HENT: Positive for rhinorrhea and trouble swallowing.   Eyes: Negative.   Respiratory: Positive for shortness of breath.   Cardiovascular: Negative.   Gastrointestinal: Positive for constipation.  Endocrine: Negative.   Genitourinary: Negative.   Musculoskeletal: Negative.   Skin: Negative.   Allergic/Immunologic: Negative.   Neurological: Negative.   Hematological: Negative.   Psychiatric/Behavioral: Negative.     Objective:  Neurologic Exam  Physical Exam Physical Examination:   Filed Vitals:   07/15/13 0900  BP: 144/85  Pulse: 58  Temp: 97.6 F (36.4 C)    General Examination: The patient is a very pleasant 63 y.o. male in no acute distress. He appears well-developed and well-nourished and very well groomed. He is overweight.   HEENT: Normocephalic, atraumatic, pupils are equal, round and reactive to light and  accommodation. Funduscopic exam is normal with sharp disc margins noted. Extraocular tracking is good without limitation to gaze excursion or nystagmus noted. Normal smooth pursuit is noted. Hearing is grossly intact. Tympanic membranes are clear bilaterally. Face is symmetric with normal facial animation and normal facial sensation. Speech is clear with no dysarthria noted. There is no hypophonia. There is no lip, neck/head, jaw or voice tremor. Neck is supple with full range of passive and active motion. There are no carotid bruits on auscultation. Oropharynx exam reveals: mild mouth dryness, good dental hygiene and moderate airway crowding, due to redundant soft palate and floppy, wider based uvula. Mallampati is class III. Tongue protrudes centrally and palate elevates symmetrically. Tonsils are either  absent or very small. Neck size is 17 inches.   Chest: Clear to auscultation without wheezing, rhonchi or crackles noted.  Heart: S1+S2+0, regular and normal without murmurs, rubs or gallops noted.   Abdomen: Soft, non-tender and non-distended with normal bowel sounds appreciated on auscultation.  Extremities: There is no pitting edema in the distal lower extremities bilaterally. Pedal pulses are intact. His right leg is smaller in caliber than the L.   Skin: Warm and dry without trophic changes noted. There are no varicose veins.  Musculoskeletal: exam reveals no obvious joint deformities, tenderness or joint swelling or erythema.   Neurologically:  Mental status: The patient is awake, alert and oriented in all 4 spheres. His memory, attention, language and knowledge are appropriate. There is no aphasia, agnosia, apraxia or anomia. Speech is clear with normal prosody and enunciation. Thought process is linear. Mood is congruent and affect is normal.  Cranial nerves are as described above under HEENT exam. In addition, shoulder shrug is normal with equal shoulder height noted. Motor exam: Normal  bulk, strength and tone is noted, with the exception of mild weakness in the R foot dorsiflexion. There is no drift, tremor or rebound. Romberg is positive. Reflexes are 1+ throughout. Toes are downgoing bilaterally. Fine motor skills are intact with normal finger taps, normal hand movements, normal rapid alternating patting, normal foot taps and normal foot agility.  Cerebellar testing shows no dysmetria or intention tremor on finger to nose testing. Heel to shin is unremarkable bilaterally. There is no truncal or gait ataxia.  Sensory exam is intact to light touch, pinprick, vibration, temperature sense in the upper and lower extremities, with the exception of decrease in PP and temperature sense in the RLE.  Gait, station and balance: He has loss of lumbar lordosis and has a mildly stooped posture. No veering to one side is noted. No leaning to one side is noted. Posture is age-appropriate and stance is narrow based. No problems turning are noted. He turns en bloc. Tandem walk is difficult for him. He cannot do toe or heel stance, unchanged.               Assessment and Plan:   In summary, Rodney Crane is a very pleasant 63 y.o.-year old male with an underlying medical history of hypertension, smoking, hyperlipidemia, type 2 diabetes, degenerative back disease, s/p 7 back surgeris, and stroke (incidental finding on an MRI per patient), who presents for followup consultation of his obstructive sleep apnea, now on CPAP treatment at a pressure of 7 cwp. His physical exam is stable and He indicates fairly good results with the use of CPAP, and good tolerance of the pressure and mask. I talked to him about his test results in detail and reviewed the compliance data with the patient and encouraged him to continue to use CPAP regularly to help reduce cardiovascular risk. I congratulated him on his exemplary compliance.  He has unchanged evidence of mild neuropathy, likely diabetic and continues to have chronic  LBP.   We also talked about trying to maintaining a healthy lifestyle in general. I encouraged the patient to eat healthy, exercise daily and keep well hydrated, to keep a scheduled bedtime and wake time routine, to not skip any meals and eat healthy snacks in between meals and to have protein with every meal. I stressed the importance of regular exercise.   I answered all his questions today and the patient was in agreement with the above outlined plan. I would  like to see the patient back in 6 months, sooner if the need arises and encouraged him to call with any interim questions, concerns, problems or updates.

## 2013-07-19 ENCOUNTER — Encounter: Payer: Self-pay | Admitting: Neurology

## 2013-07-19 DIAGNOSIS — L0293 Carbuncle, unspecified: Secondary | ICD-10-CM | POA: Diagnosis not present

## 2013-07-19 DIAGNOSIS — L0292 Furuncle, unspecified: Secondary | ICD-10-CM | POA: Diagnosis not present

## 2013-07-19 DIAGNOSIS — Z6841 Body Mass Index (BMI) 40.0 and over, adult: Secondary | ICD-10-CM | POA: Diagnosis not present

## 2013-07-19 DIAGNOSIS — E1149 Type 2 diabetes mellitus with other diabetic neurological complication: Secondary | ICD-10-CM | POA: Diagnosis not present

## 2013-07-23 DIAGNOSIS — G4733 Obstructive sleep apnea (adult) (pediatric): Secondary | ICD-10-CM | POA: Diagnosis not present

## 2013-07-23 DIAGNOSIS — L7211 Pilar cyst: Secondary | ICD-10-CM | POA: Diagnosis not present

## 2013-07-23 DIAGNOSIS — E785 Hyperlipidemia, unspecified: Secondary | ICD-10-CM | POA: Diagnosis not present

## 2013-07-23 DIAGNOSIS — J984 Other disorders of lung: Secondary | ICD-10-CM | POA: Diagnosis not present

## 2013-07-23 DIAGNOSIS — E1149 Type 2 diabetes mellitus with other diabetic neurological complication: Secondary | ICD-10-CM | POA: Diagnosis not present

## 2013-07-23 DIAGNOSIS — E669 Obesity, unspecified: Secondary | ICD-10-CM | POA: Diagnosis not present

## 2013-07-23 DIAGNOSIS — I10 Essential (primary) hypertension: Secondary | ICD-10-CM | POA: Diagnosis not present

## 2013-07-23 DIAGNOSIS — G589 Mononeuropathy, unspecified: Secondary | ICD-10-CM | POA: Diagnosis not present

## 2013-07-29 DIAGNOSIS — M961 Postlaminectomy syndrome, not elsewhere classified: Secondary | ICD-10-CM | POA: Diagnosis not present

## 2013-08-02 DIAGNOSIS — L7211 Pilar cyst: Secondary | ICD-10-CM | POA: Diagnosis not present

## 2013-08-02 DIAGNOSIS — L259 Unspecified contact dermatitis, unspecified cause: Secondary | ICD-10-CM | POA: Diagnosis not present

## 2013-08-09 DIAGNOSIS — M961 Postlaminectomy syndrome, not elsewhere classified: Secondary | ICD-10-CM | POA: Diagnosis not present

## 2013-08-28 ENCOUNTER — Encounter: Payer: Self-pay | Admitting: Neurology

## 2013-09-03 ENCOUNTER — Other Ambulatory Visit: Payer: Self-pay | Admitting: Dermatology

## 2013-09-03 DIAGNOSIS — L723 Sebaceous cyst: Secondary | ICD-10-CM | POA: Diagnosis not present

## 2013-09-04 NOTE — Progress Notes (Signed)
Quick Note:  I reviewed the patient's CPAP compliance data from 07/10/2013 to 08/08/2013, which is a total of 30 days, during which time the patient used CPAP every day. The average usage for all days was 7 hours and 35 minutes. The percent used days greater than 4 hours was 100 %, indicating superb compliance. The residual AHI was 1.1 per hour, indicating a good treatment pressure of 7 cwp with EPR of 3. Air leak from the mask was very low. I will review this data with the patient at the next office visit, which is currently routinely scheduled for 01/15/2014 at 11:30 AM, provide feedback and additional troubleshooting if need be.  Star Age, MD, PhD [facility] ([facility]   ______

## 2013-09-06 ENCOUNTER — Encounter: Payer: Self-pay | Admitting: Neurology

## 2013-09-11 DIAGNOSIS — M545 Low back pain, unspecified: Secondary | ICD-10-CM | POA: Diagnosis not present

## 2013-09-11 DIAGNOSIS — Z9889 Other specified postprocedural states: Secondary | ICD-10-CM | POA: Diagnosis not present

## 2013-09-20 DIAGNOSIS — M545 Low back pain, unspecified: Secondary | ICD-10-CM | POA: Diagnosis not present

## 2013-09-20 DIAGNOSIS — M5137 Other intervertebral disc degeneration, lumbosacral region: Secondary | ICD-10-CM | POA: Diagnosis not present

## 2013-09-20 DIAGNOSIS — M961 Postlaminectomy syndrome, not elsewhere classified: Secondary | ICD-10-CM | POA: Diagnosis not present

## 2013-10-29 DIAGNOSIS — M545 Low back pain, unspecified: Secondary | ICD-10-CM | POA: Diagnosis not present

## 2013-10-29 DIAGNOSIS — M961 Postlaminectomy syndrome, not elsewhere classified: Secondary | ICD-10-CM | POA: Diagnosis not present

## 2013-11-01 DIAGNOSIS — M5137 Other intervertebral disc degeneration, lumbosacral region: Secondary | ICD-10-CM | POA: Diagnosis not present

## 2013-11-01 DIAGNOSIS — M961 Postlaminectomy syndrome, not elsewhere classified: Secondary | ICD-10-CM | POA: Diagnosis not present

## 2014-01-13 DIAGNOSIS — M5430 Sciatica, unspecified side: Secondary | ICD-10-CM | POA: Diagnosis not present

## 2014-01-13 DIAGNOSIS — E1349 Other specified diabetes mellitus with other diabetic neurological complication: Secondary | ICD-10-CM | POA: Diagnosis not present

## 2014-01-14 ENCOUNTER — Encounter: Payer: Self-pay | Admitting: Neurology

## 2014-01-15 ENCOUNTER — Ambulatory Visit (INDEPENDENT_AMBULATORY_CARE_PROVIDER_SITE_OTHER): Payer: Medicare Other | Admitting: Neurology

## 2014-01-15 ENCOUNTER — Encounter: Payer: Self-pay | Admitting: Neurology

## 2014-01-15 VITALS — BP 127/70 | HR 61 | Temp 97.5°F | Ht 73.0 in | Wt 261.0 lb

## 2014-01-15 DIAGNOSIS — M545 Low back pain, unspecified: Secondary | ICD-10-CM | POA: Diagnosis not present

## 2014-01-15 DIAGNOSIS — G609 Hereditary and idiopathic neuropathy, unspecified: Secondary | ICD-10-CM | POA: Diagnosis not present

## 2014-01-15 DIAGNOSIS — Z8673 Personal history of transient ischemic attack (TIA), and cerebral infarction without residual deficits: Secondary | ICD-10-CM | POA: Diagnosis not present

## 2014-01-15 DIAGNOSIS — G8929 Other chronic pain: Secondary | ICD-10-CM

## 2014-01-15 DIAGNOSIS — Z9989 Dependence on other enabling machines and devices: Principal | ICD-10-CM

## 2014-01-15 DIAGNOSIS — G4733 Obstructive sleep apnea (adult) (pediatric): Secondary | ICD-10-CM

## 2014-01-15 DIAGNOSIS — E669 Obesity, unspecified: Secondary | ICD-10-CM

## 2014-01-15 NOTE — Progress Notes (Signed)
Subjective:    Patient ID: Rodney Crane is a 63 y.o. male.  HPI    Interim history:   Rodney Crane is a very friendly 63 year old right-handed gentleman with an underlying medical history of hypertension, smoking, hyperlipidemia, type 2 diabetes, degenerative back disease, s/p 7 back surgeris, and stroke (incidental finding on an MRI per patient), who presents for followup consultation of his obstructive sleep apnea. He is unaccompanied today. I last saw him on 07/15/2013, at which time we talked about his sleep study results as well as his compliance data. He reported having adjusted well to CPAP, but he had nasal congestion and some nasal dryness. He felt better and his wife and worse that he slept better. Today I reviewed his compliance data from 12/14/2013 through 01/12/2014 which is a total of 30 days during which time he used CPAP every night. Percent used days greater than 4 hours was 100%, indicating superb compliance. Average usage was 8 hours and 12 minutes, residual AHI low at 1.2 per hour and leak very low at 1.2 L per minute at the 95th percentile. Pressure at 7 cm with EPR of 3.  Today, he reports sleep well with CPAP, he is used to using it and has been sleeping well. He takes it on his vacation. He has no new complaints and is pleased with how he is doing. He still has back pain, and may need a spinal cord stimulator. He has lost about 20 lb.    I first met him on 04/12/2013, at which time he complained of snoring and excessive daytime somnolence. I suggested he have a sleep study. He had a split-night sleep study on 05/28/2013 and went over his test results with him in detail today. His baseline sleep efficiency was reduced at 68.1% with a latency to sleep of 33 minutes and wake after sleep onset of 46.5 minutes with moderate to severe sleep fragmentation noted. His arousal index was mildly elevated. He had an increased percentage of stage I and stage II sleep, absence of slow-wave  sleep, and a reduced percentage of REM sleep with a mildly prolonged REM latency. He had no significant EKG changes or periodic leg movements of sleep. He had moderate to loud snoring. He had a total AHI of 15.2 per hour, rising to 38 per hour in REM sleep. His baseline oxygen saturation was 95% with a nadir of 78% during REM sleep. He was then titrated on CPAP. His sleep efficiency improved. His arousal index improved. His sleep architecture showed an increased percentage of stage II sleep, absence of slow-wave sleep and a reduced percentage of REM sleep. Average oxygen saturation was 96%, nadir was 92%, greatly improved. He was titrated on CPAP from 5-7 cm with a reduction of his AHI to 0 per hour on the final pressure. He had REM sleep but no supine sleep was achieved during this part of the study. He indicated that he does not sleep on his back. He is status post multiple back operations. Based on the test results I prescribed CPAP for him.  I reviewed the patient's CPAP compliance data from 06/09/2013 to 07/08/2013, which is a total of 30 days, during which time the patient used CPAP every day except for 1 day. The average usage for all days was 7 hours and 44 minutes. The percent used days greater than 4 hours was 97 %, indicating excellent compliance. The residual AHI was 1.3 per hour, indicating an appropriate treatment pressure of 7 cwp with EPR  of 3.  Today, I reviewed his compliance data of with his machine: 06/10/2013 through 07/14/2013 which is a total of 35 days during which time use CPAP every night with a percent used days greater than 4 hours of 100%, indicating excellent compliance, average usage was 7 hours and 47 minutes, residual AHI low at 1.3 per hour and leak was low at 1.4. Pressure was 7 cm with EPR of 3.  His Past Medical History Is Significant For: Past Medical History  Diagnosis Date  . Arthritis   . Diabetes mellitus   . Hyperlipidemia   . Hypertension   . CVA (cerebral  infarction) 2010    His Past Surgical History Is Significant For: Past Surgical History  Procedure Laterality Date  . Lumbar disc surgery      x6  . Cervical discectomy  2004  . Carpal tunnel release      bilateral    His Family History Is Significant For: Family History  Problem Relation Age of Onset  . Colon cancer Neg Hx   . Stomach cancer Neg Hx   . Multiple sclerosis Sister   . Dementia Mother   . Cancer Father     His Social History Is Significant For: History   Social History  . Marital Status: Married    Spouse Name: Gwendolyn    Number of Children: 2  . Years of Education: 14   Occupational History  .      retired   Social History Main Topics  . Smoking status: Current Some Day Smoker    Last Attempt to Quit: 01/21/2011  . Smokeless tobacco: Never Used     Comment: 2-3 times weekly  . Alcohol Use: No  . Drug Use: No  . Sexual Activity: None   Other Topics Concern  . None   Social History Narrative   Patient is right handed and resides with wife    His Allergies Are:  No Known Allergies:   His Current Medications Are:  Outpatient Encounter Prescriptions as of 01/15/2014  Medication Sig  . aspirin 81 MG tablet Take 81 mg by mouth daily.    Marland Kitchen atorvastatin (LIPITOR) 20 MG tablet Take 20 mg by mouth daily.  Marland Kitchen DIOVAN 160 MG tablet Take 160 mg by mouth daily.   . metFORMIN (GLUCOPHAGE) 1000 MG tablet Take 1,000 mg by mouth 2 (two) times daily with a meal.  . metoprolol (TOPROL XL) 50 MG 24 hr tablet Take 50 mg by mouth daily.    . pregabalin (LYRICA) 75 MG capsule Take 75 mg by mouth daily.  . traMADol (ULTRAM) 50 MG tablet Take 25 mg by mouth at bedtime.  . [DISCONTINUED] metFORMIN (GLUCOPHAGE) 500 MG tablet Take 500 mg by mouth 2 (two) times daily with a meal.   :  Review of Systems:  Out of a complete 14 point review of systems, all are reviewed and negative with the exception of these symptoms as listed below:   Review of Systems   Gastrointestinal: Positive for constipation.  Musculoskeletal: Positive for back pain and gait problem.       Joint pain    Objective:  Neurologic Exam  Physical Exam Physical Examination:   Filed Vitals:   01/15/14 1150  BP: 127/70  Pulse: 61  Temp: 97.5 F (36.4 C)    General Examination: The patient is a very pleasant 63 y.o. male in no acute distress. He appears well-developed and well-nourished and very well groomed. He is overweight.  HEENT: Normocephalic, atraumatic, pupils are equal, round and reactive to light and accommodation. Funduscopic exam is normal with sharp disc margins noted. Extraocular tracking is good without limitation to gaze excursion or nystagmus noted. Normal smooth pursuit is noted. Hearing is grossly intact. Tympanic membranes are clear bilaterally. Face is symmetric with normal facial animation and normal facial sensation. Speech is clear with no dysarthria noted. There is no hypophonia. There is no lip, neck/head, jaw or voice tremor. Neck is supple with full range of passive and active motion. There are no carotid bruits on auscultation. Oropharynx exam reveals: mild mouth dryness, good dental hygiene and moderate airway crowding, due to redundant soft palate and floppy, wider based uvula. Mallampati is class III. Tongue protrudes centrally and palate elevates symmetrically. Tonsils are either absent or very small. Neck size is 17 inches.   Chest: Clear to auscultation without wheezing, rhonchi or crackles noted.  Heart: S1+S2+0, regular and normal without murmurs, rubs or gallops noted.   Abdomen: Soft, non-tender and non-distended with normal bowel sounds appreciated on auscultation.  Extremities: There is no pitting edema in the distal lower extremities bilaterally. Pedal pulses are intact. His right leg is smaller in caliber than the L, unchanged.   Skin: Warm and dry without trophic changes noted. There are no varicose veins.  Musculoskeletal:  exam reveals no obvious joint deformities, tenderness or joint swelling or erythema.   Neurologically:  Mental status: The patient is awake, alert and oriented in all 4 spheres. His memory, attention, language and knowledge are appropriate. There is no aphasia, agnosia, apraxia or anomia. Speech is clear with normal prosody and enunciation. Thought process is linear. Mood is congruent and affect is normal.  Cranial nerves are as described above under HEENT exam. In addition, shoulder shrug is normal with equal shoulder height noted. Motor exam: Normal bulk, strength and tone is noted, with the exception of mild weakness in the R foot dorsiflexion. There is no drift, tremor or rebound. Romberg is positive. Reflexes are 1+ throughout with absent R ankle jerk. Toes are downgoing bilaterally. Fine motor skills are intact with normal finger taps, normal hand movements, normal rapid alternating patting, normal foot taps and normal foot agility.  Cerebellar testing shows no dysmetria or intention tremor on finger to nose testing. Heel to shin is unremarkable bilaterally. There is no truncal or gait ataxia.  Sensory exam is intact to light touch, pinprick, vibration, temperature sense in the upper and lower extremities, with the exception of decrease in PP and temperature sense in the RLE distally.  Gait, station and balance: He has loss of lumbar lordosis and has a mildly stooped posture. No veering to one side is noted. No leaning to one side is noted. Posture is age-appropriate and stance is narrow based. No problems turning are noted. He turns en bloc. Tandem walk is difficult for him. He cannot do toe or heel stance, unchanged.               Assessment and Plan:   In summary, Rodney Crane is a very pleasant 63 year old male with an underlying medical history of hypertension, smoking, hyperlipidemia, type 2 diabetes, degenerative back disease, s/p 7 back surgeris, and stroke (incidental finding on an MRI  per patient), who presents for followup consultation of his obstructive sleep apnea, on CPAP treatment at a pressure of 7 cwp. His physical exam is stable and he indicates ongoing good results with the use of CPAP, and good tolerance of the pressure and  mask. I talked to him about his test results again in detail and reviewed the compliance data with the patient and encouraged him to continue to use CPAP regularly to help reduce cardiovascular risk. I congratulated him on his superp compliance.  He has unchanged evidence of mild neuropathy, likely diabetic and continues to have chronic LBP.   We also talked about trying to maintaining a healthy lifestyle in general. I encouraged the patient to eat healthy, exercise daily and keep well hydrated, to keep a scheduled bedtime and wake time routine, to not skip any meals and eat healthy snacks in between meals and to have protein with every meal. I stressed the importance of regular exercise.   I answered all his questions today and the patient was in agreement with the above outlined plan. I would like to see the patient back in 1 year, sooner if the need arises and encouraged him to call with any interim questions, concerns, problems or updates.

## 2014-01-15 NOTE — Patient Instructions (Signed)

## 2014-02-12 DIAGNOSIS — F4542 Pain disorder with related psychological factors: Secondary | ICD-10-CM | POA: Diagnosis not present

## 2014-02-13 DIAGNOSIS — Z23 Encounter for immunization: Secondary | ICD-10-CM | POA: Diagnosis not present

## 2014-03-05 DIAGNOSIS — M961 Postlaminectomy syndrome, not elsewhere classified: Secondary | ICD-10-CM | POA: Diagnosis not present

## 2014-03-05 DIAGNOSIS — M5442 Lumbago with sciatica, left side: Secondary | ICD-10-CM | POA: Diagnosis not present

## 2014-03-05 DIAGNOSIS — M4807 Spinal stenosis, lumbosacral region: Secondary | ICD-10-CM | POA: Diagnosis not present

## 2014-03-14 DIAGNOSIS — M4716 Other spondylosis with myelopathy, lumbar region: Secondary | ICD-10-CM | POA: Diagnosis not present

## 2014-03-14 DIAGNOSIS — M5136 Other intervertebral disc degeneration, lumbar region: Secondary | ICD-10-CM | POA: Diagnosis not present

## 2014-03-14 DIAGNOSIS — M961 Postlaminectomy syndrome, not elsewhere classified: Secondary | ICD-10-CM | POA: Diagnosis not present

## 2014-09-26 DIAGNOSIS — M961 Postlaminectomy syndrome, not elsewhere classified: Secondary | ICD-10-CM | POA: Diagnosis not present

## 2014-09-26 DIAGNOSIS — M5442 Lumbago with sciatica, left side: Secondary | ICD-10-CM | POA: Diagnosis not present

## 2014-09-26 DIAGNOSIS — G8929 Other chronic pain: Secondary | ICD-10-CM | POA: Diagnosis not present

## 2014-09-26 DIAGNOSIS — M4807 Spinal stenosis, lumbosacral region: Secondary | ICD-10-CM | POA: Diagnosis not present

## 2014-10-03 DIAGNOSIS — M5442 Lumbago with sciatica, left side: Secondary | ICD-10-CM | POA: Diagnosis not present

## 2014-10-03 DIAGNOSIS — M5136 Other intervertebral disc degeneration, lumbar region: Secondary | ICD-10-CM | POA: Diagnosis not present

## 2014-10-03 DIAGNOSIS — M961 Postlaminectomy syndrome, not elsewhere classified: Secondary | ICD-10-CM | POA: Diagnosis not present

## 2014-10-03 DIAGNOSIS — M5116 Intervertebral disc disorders with radiculopathy, lumbar region: Secondary | ICD-10-CM | POA: Diagnosis not present

## 2014-10-17 DIAGNOSIS — E1140 Type 2 diabetes mellitus with diabetic neuropathy, unspecified: Secondary | ICD-10-CM | POA: Diagnosis not present

## 2014-10-17 DIAGNOSIS — Z125 Encounter for screening for malignant neoplasm of prostate: Secondary | ICD-10-CM | POA: Diagnosis not present

## 2014-10-17 DIAGNOSIS — E785 Hyperlipidemia, unspecified: Secondary | ICD-10-CM | POA: Diagnosis not present

## 2014-10-21 DIAGNOSIS — I10 Essential (primary) hypertension: Secondary | ICD-10-CM | POA: Diagnosis not present

## 2014-10-21 DIAGNOSIS — E785 Hyperlipidemia, unspecified: Secondary | ICD-10-CM | POA: Diagnosis not present

## 2014-10-21 DIAGNOSIS — M545 Low back pain: Secondary | ICD-10-CM | POA: Diagnosis not present

## 2014-10-21 DIAGNOSIS — E669 Obesity, unspecified: Secondary | ICD-10-CM | POA: Diagnosis not present

## 2014-10-21 DIAGNOSIS — G4733 Obstructive sleep apnea (adult) (pediatric): Secondary | ICD-10-CM | POA: Diagnosis not present

## 2014-10-21 DIAGNOSIS — E1140 Type 2 diabetes mellitus with diabetic neuropathy, unspecified: Secondary | ICD-10-CM | POA: Diagnosis not present

## 2014-10-21 DIAGNOSIS — J984 Other disorders of lung: Secondary | ICD-10-CM | POA: Diagnosis not present

## 2014-10-21 DIAGNOSIS — G629 Polyneuropathy, unspecified: Secondary | ICD-10-CM | POA: Diagnosis not present

## 2014-10-21 DIAGNOSIS — F5221 Male erectile disorder: Secondary | ICD-10-CM | POA: Diagnosis not present

## 2014-10-21 DIAGNOSIS — Z1389 Encounter for screening for other disorder: Secondary | ICD-10-CM | POA: Diagnosis not present

## 2014-10-21 DIAGNOSIS — Z0000 Encounter for general adult medical examination without abnormal findings: Secondary | ICD-10-CM | POA: Diagnosis not present

## 2014-10-21 DIAGNOSIS — Z6836 Body mass index (BMI) 36.0-36.9, adult: Secondary | ICD-10-CM | POA: Diagnosis not present

## 2014-10-29 DIAGNOSIS — Z1212 Encounter for screening for malignant neoplasm of rectum: Secondary | ICD-10-CM | POA: Diagnosis not present

## 2014-12-03 DIAGNOSIS — H539 Unspecified visual disturbance: Secondary | ICD-10-CM | POA: Diagnosis not present

## 2014-12-03 DIAGNOSIS — R42 Dizziness and giddiness: Secondary | ICD-10-CM | POA: Diagnosis not present

## 2014-12-03 DIAGNOSIS — Z6836 Body mass index (BMI) 36.0-36.9, adult: Secondary | ICD-10-CM | POA: Diagnosis not present

## 2014-12-03 DIAGNOSIS — E785 Hyperlipidemia, unspecified: Secondary | ICD-10-CM | POA: Diagnosis not present

## 2014-12-03 DIAGNOSIS — K117 Disturbances of salivary secretion: Secondary | ICD-10-CM | POA: Diagnosis not present

## 2014-12-03 DIAGNOSIS — I10 Essential (primary) hypertension: Secondary | ICD-10-CM | POA: Diagnosis not present

## 2014-12-03 DIAGNOSIS — R269 Unspecified abnormalities of gait and mobility: Secondary | ICD-10-CM | POA: Diagnosis not present

## 2014-12-03 DIAGNOSIS — E1140 Type 2 diabetes mellitus with diabetic neuropathy, unspecified: Secondary | ICD-10-CM | POA: Diagnosis not present

## 2014-12-03 DIAGNOSIS — R209 Unspecified disturbances of skin sensation: Secondary | ICD-10-CM | POA: Diagnosis not present

## 2014-12-04 DIAGNOSIS — I639 Cerebral infarction, unspecified: Secondary | ICD-10-CM | POA: Diagnosis not present

## 2014-12-10 DIAGNOSIS — I639 Cerebral infarction, unspecified: Secondary | ICD-10-CM | POA: Diagnosis not present

## 2014-12-16 DIAGNOSIS — M21379 Foot drop, unspecified foot: Secondary | ICD-10-CM | POA: Insufficient documentation

## 2014-12-16 DIAGNOSIS — R269 Unspecified abnormalities of gait and mobility: Secondary | ICD-10-CM | POA: Insufficient documentation

## 2014-12-22 ENCOUNTER — Ambulatory Visit (INDEPENDENT_AMBULATORY_CARE_PROVIDER_SITE_OTHER): Payer: Medicare Other | Admitting: Neurology

## 2014-12-22 ENCOUNTER — Encounter: Payer: Self-pay | Admitting: Neurology

## 2014-12-22 VITALS — BP 160/82 | HR 76 | Resp 18 | Ht 73.0 in | Wt 275.0 lb

## 2014-12-22 DIAGNOSIS — R930 Abnormal findings on diagnostic imaging of skull and head, not elsewhere classified: Secondary | ICD-10-CM | POA: Diagnosis not present

## 2014-12-22 DIAGNOSIS — R9089 Other abnormal findings on diagnostic imaging of central nervous system: Secondary | ICD-10-CM

## 2014-12-22 DIAGNOSIS — R079 Chest pain, unspecified: Secondary | ICD-10-CM

## 2014-12-22 DIAGNOSIS — M6289 Other specified disorders of muscle: Secondary | ICD-10-CM | POA: Diagnosis not present

## 2014-12-22 DIAGNOSIS — G8929 Other chronic pain: Secondary | ICD-10-CM | POA: Diagnosis not present

## 2014-12-22 DIAGNOSIS — R208 Other disturbances of skin sensation: Secondary | ICD-10-CM

## 2014-12-22 DIAGNOSIS — G4733 Obstructive sleep apnea (adult) (pediatric): Secondary | ICD-10-CM | POA: Diagnosis not present

## 2014-12-22 DIAGNOSIS — Z8673 Personal history of transient ischemic attack (TIA), and cerebral infarction without residual deficits: Secondary | ICD-10-CM | POA: Diagnosis not present

## 2014-12-22 DIAGNOSIS — Z9989 Dependence on other enabling machines and devices: Secondary | ICD-10-CM

## 2014-12-22 DIAGNOSIS — M545 Low back pain, unspecified: Secondary | ICD-10-CM

## 2014-12-22 DIAGNOSIS — R531 Weakness: Secondary | ICD-10-CM

## 2014-12-22 DIAGNOSIS — E119 Type 2 diabetes mellitus without complications: Secondary | ICD-10-CM | POA: Diagnosis not present

## 2014-12-22 DIAGNOSIS — R200 Anesthesia of skin: Secondary | ICD-10-CM

## 2014-12-22 DIAGNOSIS — I6930 Unspecified sequelae of cerebral infarction: Secondary | ICD-10-CM

## 2014-12-22 NOTE — Patient Instructions (Addendum)
Please continue using your CPAP regularly. While your insurance requires that you use CPAP at least 4 hours each night on 70% of the nights, I recommend, that you not skip any nights and use it throughout the night if you can. Getting used to CPAP and staying with the treatment long term does take time and patience and discipline. Untreated obstructive sleep apnea when it is moderate to severe can have an adverse impact on cardiovascular health and raise her risk for heart disease, arrhythmias, hypertension, congestive heart failure, stroke and diabetes. Untreated obstructive sleep apnea causes sleep disruption, nonrestorative sleep, and sleep deprivation. This can have an impact on your day to day functioning and cause daytime sleepiness and impairment of cognitive function, memory loss, mood disturbance, and problems focussing. Using CPAP regularly can improve these symptoms.  Your are doing a fantastic job with the CPAP. Keep up the good work!   Continue exercising regularly and take your medications as directed. As discussed, secondary prevention is key after a stroke. This means: taking care of blood sugar values or diabetes management, good blood pressure (hypertension) control and optimizing cholesterol management, exercising daily or regularly within your own mobility limitations of course, and overall cardiovascular risk factor reduction, which includes screening for and treatment of obstructive sleep apnea (OSA) and weight management.   Continue your Plavix.   We will request another brain MRI, this time with contrast and we will call you with the result.   We will request a consultation with Dr. Erlinda Hong, who is specialized in stroke management for optimization of management.   For your chest pain, we will send you to Cardiology and also request an echocardiogram. I have requested, that you see Dr. Einar Gip.   For any acute chest pain or acute neurological symptoms, such as sudden onset of one sided  weakness, numbness, tingling, slurring of speech or droopy face, hearing loss, new ringing in the ears, double vision or visual field cut or loss of vision, and severe headaches.

## 2014-12-22 NOTE — Progress Notes (Signed)
Subjective:    Patient ID: Rodney Crane is a 64 y.o. male.  HPI     Interim history:  Rodney Crane is a very friendly 64 year old right-handed gentleman with an underlying medical history of hypertension, smoking, hyperlipidemia, type 2 diabetes, degenerative back disease, s/p 7 back surgeris, and stroke (incidental finding on an MRI per patient), who presents for a new problem and is referred by his primary care physician for gait instability, and foot drop as well as a history of spinal stenosis and recent/incidental stroke. The patient is accompanied by his wife today. I last saw him on 01/15/2014 for sleep apnea follow-up at which time the patient reported doing well with CPAP and he was using it and was fully compliant with treatment. He reported that he had residual back pain and may need a spinal cord stimulator. He reported a 20 pound weight loss overall. I suggested a one-year follow-up.  Today, 12/22/2014: He reports that he was told he had a stroke. This showed up on his recent brain MRI. I reviewed Dr. Raul Del office note from 12/16/2014. He had a brain MRI without contrast on 12/04/2014: Punctate subacute infarction in the left thalamus. Mild leukoarosis, unchanged. This is most likely due to chronic microvascular disease. Increased signal intensity in the pons that may represent a demyelinating process versus chronic microvascular disease, unchanged. He had a carotid Doppler study at [facility] on 12/10/2014 with impression: Minimal bilateral atherosclerosis without flow limiting stenosis, less than 50% stenosis bilaterally.  He reports that he started having some symptoms about a month ago. He started having numbness and tingling in his left face and his tongue felt swollen. He had changes in his taste and report some loss of appetite. He has lost weight. He had some grip strength weakness on the right which his wife noted that he tried to downplay it at the time. She  became really concerned that she also noted slurring of speech intermittently, especially when he was tired and fatigued. He reports significant fatigue for the past month or 2. He has a history of foot drop and low back pain and these symptoms seem unchanged. Since his MRI earlier this month he was told to stop his baby aspirin and start Plavix. He has been taking Plavix regularly since then. He does admit that he was not always fully compliant with his baby aspirin. He had some intermittent chest pain. He did not bring this up necessarily at his doctor's appointment. He tries to work out regularly. He has been exercising at the gym 3 to even 4 times a week. He has not seen a cardiologist in years. He does admit to smoking some cigarettes. His wife provides additional information. She states that he tries to drink water and avoid sodas. He has improved in that regard. He still may not drink enough water. He is fully compliant with CPAP therapy. He had recent supplies delivered.  He had an MRI lumbar spine with and without contrast through Dr. Susa Day on 10/18/2006 (Indication: Left-sided back pain radiating to both legs. Weakness and numbness on the left side, ongoing for 60 months. History per lumbar surgery): 1. Multilevel central, foraminal, and subarticular lateral recess stenoses as detailed above, with the most striking findings at the L3-L4 level. 2. There is a suggestion of dural enhancement and potentially peripheral nerve root clumping in the lower portion of the thecal sac, suspicious for mild arachnoiditis.  Today, 12/22/2014: I reviewed his CPAP compliance data from 11/21/2014 through  12/20/2014 which is a total of 30 days during which time he used his machine every night with percent used days greater than 4 hours at 100%, indicating superb compliance with an average usage of 7 hours and 16 minutes, residual AHI at 0.8 per hour, leaked low with the 95th percentile at 3.5 L/m on a  pressure of 7 cm with EPR of 3.  Previously:  I saw him on 07/15/2013, at which time we talked about his sleep study results as well as his compliance data. He reported having adjusted well to CPAP, but he had nasal congestion and some nasal dryness. He felt better and his wife endorsed that he slept better.  I reviewed his compliance data from 12/14/2013 through 01/12/2014 which is a total of 30 days during which time he used CPAP every night. Percent used days greater than 4 hours was 100%, indicating superb compliance. Average usage was 8 hours and 12 minutes, residual AHI low at 1.2 per hour and leak very low at 1.2 L per minute at the 95th percentile. Pressure at 7 cm with EPR of 3.    I first met him on 04/12/2013, at which time he complained of snoring and excessive daytime somnolence. I suggested he have a sleep study. He had a split-night sleep study on 05/28/2013 and went over his test results with him in detail today. His baseline sleep efficiency was reduced at 68.1% with a latency to sleep of 33 minutes and wake after sleep onset of 46.5 minutes with moderate to severe sleep fragmentation noted. His arousal index was mildly elevated. He had an increased percentage of stage I and stage II sleep, absence of slow-wave sleep, and a reduced percentage of REM sleep with a mildly prolonged REM latency. He had no significant EKG changes or periodic leg movements of sleep. He had moderate to loud snoring. He had a total AHI of 15.2 per hour, rising to 38 per hour in REM sleep. His baseline oxygen saturation was 95% with a nadir of 78% during REM sleep. He was then titrated on CPAP. His sleep efficiency improved. His arousal index improved. His sleep architecture showed an increased percentage of stage II sleep, absence of slow-wave sleep and a reduced percentage of REM sleep. Average oxygen saturation was 96%, nadir was 92%, greatly improved. He was titrated on CPAP from 5-7 cm with a reduction of his  AHI to 0 per hour on the final pressure. He had REM sleep but no supine sleep was achieved during this part of the study. He indicated that he does not sleep on his back. He is status post multiple back operations. Based on the test results I prescribed CPAP for him.   I reviewed the patient's CPAP compliance data from 06/09/2013 to 07/08/2013, which is a total of 30 days, during which time the patient used CPAP every day except for 1 day. The average usage for all days was 7 hours and 44 minutes. The percent used days greater than 4 hours was 97 %, indicating excellent compliance. The residual AHI was 1.3 per hour, indicating an appropriate treatment pressure of 7 cwp with EPR of 3.   Today, I reviewed his compliance data of with his machine: 06/10/2013 through 07/14/2013 which is a total of 35 days during which time use CPAP every night with a percent used days greater than 4 hours of 100%, indicating excellent compliance, average usage was 7 hours and 47 minutes, residual AHI low at 1.3 per hour and  leak was low at 1.4. Pressure was 7 cm with EPR of 3.  His Past Medical History Is Significant For: Past Medical History  Diagnosis Date  . Arthritis   . Diabetes mellitus   . Hyperlipidemia   . Hypertension   . CVA (cerebral infarction) 2010  . Foot drop   . Obesity   . ED (erectile dysfunction)   . Unsteady gait   . Spinal stenosis   . Colon polyps   . H/O Bell's palsy   . OSA on CPAP   . Dizziness   . Stroke     His Past Surgical History Is Significant For: Past Surgical History  Procedure Laterality Date  . Lumbar disc surgery      x6  . Cervical discectomy  2004  . Carpal tunnel release      bilateral    His Family History Is Significant For: Family History  Problem Relation Age of Onset  . Colon cancer Neg Hx   . Stomach cancer Neg Hx   . Multiple sclerosis Sister   . Dementia Mother   . Cancer Father   . Sleep apnea Brother   . Hypertension Brother     His Social  History Is Significant For: Social History   Social History  . Marital Status: Married    Spouse Name: Nicki Reaper  . Number of Children: 2  . Years of Education: 14   Occupational History  .      retired   Social History Main Topics  . Smoking status: Current Some Day Smoker    Last Attempt to Quit: 01/21/2011  . Smokeless tobacco: Never Used     Comment: 2-3 times weekly  . Alcohol Use: No  . Drug Use: No  . Sexual Activity: Not Asked   Other Topics Concern  . None   Social History Narrative   Patient is right handed and resides with wife    His Allergies Are:  No Known Allergies:   His Current Medications Are:  Outpatient Encounter Prescriptions as of 12/22/2014  Medication Sig  . aspirin 81 MG tablet Take 81 mg by mouth daily.    Marland Kitchen atorvastatin (LIPITOR) 40 MG tablet Take 40 mg by mouth at bedtime.  . clopidogrel (PLAVIX) 75 MG tablet Take 75 mg by mouth daily.  . metFORMIN (GLUCOPHAGE) 1000 MG tablet Take 1,000 mg by mouth 2 (two) times daily with a meal.  . metoprolol (TOPROL XL) 50 MG 24 hr tablet Take 50 mg by mouth daily.    Marland Kitchen oxyCODONE-acetaminophen (PERCOCET/ROXICET) 5-325 MG per tablet TAKE 1 TABLET BY MOUTH 3 TIMES DIALY AS NEEDED  . valsartan (DIOVAN) 320 MG tablet Take 320 mg by mouth daily.  . [DISCONTINUED] atorvastatin (LIPITOR) 20 MG tablet Take 20 mg by mouth daily.  . [DISCONTINUED] DIOVAN 160 MG tablet Take 160 mg by mouth daily.   . [DISCONTINUED] pregabalin (LYRICA) 75 MG capsule Take 75 mg by mouth daily.  . [DISCONTINUED] traMADol (ULTRAM) 50 MG tablet Take 25 mg by mouth at bedtime.   Facility-Administered Encounter Medications as of 12/22/2014  Medication  . 0.9 %  sodium chloride infusion  :  Review of Systems:  Out of a complete 14 point review of systems, all are reviewed and negative with the exception of these symptoms as listed below:   Review of Systems  Neurological:       Patient was told that he had a "small stroke" on L side  of brain, a couple of  weeks ago. MRI done 8/10, Doppler done on 8/17, states blockage <50%.   Patient states that L side of face is still numb, tongue numbness, L eye twitching, speech slurring, increased tiredness, wife reports that patient's R leg drags more, patient feels more unsteady than normal.   R hand numbness.     Objective:  Neurologic Exam  Physical Exam Physical Examination:   Filed Vitals:   12/22/14 1517  BP: 160/82  Pulse: 76  Resp: 18   General Examination: The patient is a very pleasant 64 y.o. male in no acute distress. He appears well-developed and well-nourished and very well groomed. He is slightly anxious appearing. His wife is tearful at times.   HEENT: Normocephalic, atraumatic, pupils are equal, round and reactive to light and accommodation. Funduscopic exam is normal with sharp disc margins noted. He has mild bilateral cataracts. Extraocular tracking is good without limitation to gaze excursion or nystagmus noted. Normal smooth pursuit is noted. Hearing is grossly intact. Face is symmetric with normal facial animation but he reports numbness in his left lower face with temperature and pinprick sensation. Speech seems clear to me with no dysarthria noted and no hypophonia. There is no hypophonia. There is no lip, neck/head, jaw or voice tremor. Neck is supple with full range of passive and active motion. There are no carotid bruits on auscultation. Oropharynx exam reveals: mild to moderate mouth dryness, good dental hygiene and moderate airway crowding, due to redundant soft palate and floppy, wider based uvula. Mallampati is class III. Tongue protrudes centrally and palate elevates symmetrically. Tonsils are either absent or very small.   Chest: Clear to auscultation without wheezing, rhonchi or crackles noted.  Heart: S1+S2+0, regular and normal without murmurs, rubs or gallops noted.   Abdomen: Soft, non-tender and non-distended with normal bowel sounds  appreciated on auscultation.  Extremities: There is no pitting edema in the distal lower extremities bilaterally. Pedal pulses are intact. His right leg is smaller in caliber than the L, unchanged.   Skin: Warm and dry without trophic changes noted. There are no varicose veins.  Musculoskeletal: exam reveals no obvious joint deformities, tenderness or joint swelling or erythema.   Neurologically:  Mental status: The patient is awake, alert and oriented in all 4 spheres. His memory, attention, language and knowledge are appropriate. There is no aphasia, agnosia, apraxia or anomia. Speech is clear with normal prosody and enunciation. Thought process is linear. Mood is congruent and affect is normal.  Cranial nerves are as described above under HEENT exam. In addition, shoulder shrug is normal with equal shoulder height noted. Motor exam: Normal bulk, strength and tone is noted, with the exception of mild weakness in the R foot dorsiflexion, unchanged . However, he seems to have a very slight right grip strength weakness as well which seems new There is no drift, tremor or rebound. Romberg is Negative with the exception of sway but no corrective steps. Reflexes are 1+ throughout with absent R ankle jerk. Toes are downgoing bilaterally. Fine motor skills are intact with normal finger taps, normal hand movements, normal rapid alternating patting, normal foot taps and normal foot agility.  Cerebellar testing shows no dysmetria or intention tremor on finger to nose testing. Heel to shin is unremarkable bilaterally. There is no truncal or gait ataxia.  Sensory exam is intact to light touch, pinprick, vibration, temperature sense in the upper and lower extremities, with the exception of decrease in PP and temperature sense in the RLE distally, also unchanged .  Gait, station and balance: He has loss of lumbar lordosis and has a mildly stooped posture, unchanged . No veering to one side is noted. No leaning to  one side is noted. Posture is age-appropriate and stance is narrow based. No problems turning are noted. He turns en bloc. Tandem walk is  not possible for him, and this is not new for him. He cannot do toe or heel stance, unchanged.               Assessment and Plan:   In summary, Rodney Crane is a very pleasant 63 year old male with an underlying medical history of hypertension, smoking, hyperlipidemia, type 2 diabetes, degenerative back disease, s/p multiple (7?) back surgeris, and prior TIAs and previous finding of lacunar stroke (incidental finding on an MRI per patient report to me in the past), who presents for a recent stroke. About a month ago he started having left facial numbness and tingling and subjective feeling of tongue swelling. He had some intermittent chest pains and his wife feels that his foot drop has become a little worse. On examination he has decreased sensation in his left mid face, and very slight right grip strength weakness. I reviewed his MRI and he has evidence of a recent left thalamic stroke. He had an abnormality in his palms per MRI report and I suggested that we repeat an MRI with special attention to the brainstem and with contrast. He is advised about stroke secondary prevention at length today. He is fully compliant with CPAP therapy and is congratulated on his perfect treatment adherence. He is strongly advised to quit smoking completely. He is advised to continue taking Plavix 75 mg daily. He has good blood sugar control and his most recent hemoglobin A1c was 6.5. Her cholesterol management he is advised to continue his statin and follow-up with his primary care physician for regular cholesterol management and LDL goal of less than 70.  He had a carotid Doppler test recently this month and I reviewed the test results with the patient and his wife. I suggested a couple of things today: I would like to repeat his brain MRI with contrast and we will call him with his test  results. I would like to refer him to cardiology, for his recent chest pain and evaluation with an echocardiogram to complete his stroke workup. Furthermore, I would like to seek a second opinion with one of our stroke doctors, Dr. Erlinda Hong to help optimize his management and for further pointers in terms of secondary stroke prevention. At this juncture he is advised to continue with Plavix. He is advised to continue to stay compliant with CPAP therapy. He is advised to drink 6-8 glasses of water a day and stay active physically without overdoing it and without causing exhaustion.  He has unchanged evidence of mild neuropathy, likely diabetic and continues to have chronic LBP and has evidence of unchanged right foot drop. I will see him back routinely in 3-4 months, sooner if needed. For any emergency situations such as sudden chest pain, sudden changes in neurological symptoms, they are advised to call 911.    I answered all their questions today and the patient and his wife were in agreement. I encouraged them to call with any interim questions, concerns, problems or updates.  I spent 40 minutes in total face-to-face time with the patient, more than 50% of which was spent in counseling and coordination of care, reviewing test results, reviewing medication and discussing or reviewing  the diagnosis of subacute stroke, OSA, the prognosis and treatment options.

## 2014-12-30 DIAGNOSIS — M4807 Spinal stenosis, lumbosacral region: Secondary | ICD-10-CM | POA: Diagnosis not present

## 2014-12-30 DIAGNOSIS — M5136 Other intervertebral disc degeneration, lumbar region: Secondary | ICD-10-CM | POA: Diagnosis not present

## 2014-12-30 DIAGNOSIS — M961 Postlaminectomy syndrome, not elsewhere classified: Secondary | ICD-10-CM | POA: Diagnosis not present

## 2015-01-01 DIAGNOSIS — R200 Anesthesia of skin: Secondary | ICD-10-CM | POA: Diagnosis not present

## 2015-01-01 DIAGNOSIS — G939 Disorder of brain, unspecified: Secondary | ICD-10-CM | POA: Diagnosis not present

## 2015-01-06 ENCOUNTER — Telehealth: Payer: Self-pay | Admitting: Neurology

## 2015-01-06 NOTE — Telephone Encounter (Signed)
Please call pt: I received his recent brain MRI with contrast results from 01/01/2015: The previous left sided brain abnormality in the so called Thalamus has decreased in size and the report states findings are consistent with a prior stroke. No additional areas of concern were noted, so findings are reassuring. No further action required.

## 2015-01-07 NOTE — Telephone Encounter (Signed)
I spoke to patient and he is aware of results and voices understanding.  

## 2015-01-12 DIAGNOSIS — E1140 Type 2 diabetes mellitus with diabetic neuropathy, unspecified: Secondary | ICD-10-CM | POA: Diagnosis not present

## 2015-01-12 DIAGNOSIS — R0789 Other chest pain: Secondary | ICD-10-CM | POA: Diagnosis not present

## 2015-01-12 DIAGNOSIS — I10 Essential (primary) hypertension: Secondary | ICD-10-CM | POA: Diagnosis not present

## 2015-01-12 DIAGNOSIS — G4733 Obstructive sleep apnea (adult) (pediatric): Secondary | ICD-10-CM | POA: Diagnosis not present

## 2015-01-13 ENCOUNTER — Encounter: Payer: Self-pay | Admitting: Occupational Therapy

## 2015-01-13 ENCOUNTER — Ambulatory Visit: Payer: Medicare Other | Admitting: Neurology

## 2015-01-13 ENCOUNTER — Ambulatory Visit: Payer: Medicare Other | Admitting: Physical Therapy

## 2015-01-13 ENCOUNTER — Ambulatory Visit: Payer: Medicare Other

## 2015-01-13 ENCOUNTER — Ambulatory Visit: Payer: Medicare Other | Attending: Internal Medicine | Admitting: Occupational Therapy

## 2015-01-13 DIAGNOSIS — IMO0002 Reserved for concepts with insufficient information to code with codable children: Secondary | ICD-10-CM

## 2015-01-13 DIAGNOSIS — R29898 Other symptoms and signs involving the musculoskeletal system: Secondary | ICD-10-CM | POA: Diagnosis not present

## 2015-01-13 DIAGNOSIS — R269 Unspecified abnormalities of gait and mobility: Secondary | ICD-10-CM

## 2015-01-13 DIAGNOSIS — I69922 Dysarthria following unspecified cerebrovascular disease: Secondary | ICD-10-CM | POA: Insufficient documentation

## 2015-01-13 DIAGNOSIS — M6289 Other specified disorders of muscle: Secondary | ICD-10-CM | POA: Diagnosis not present

## 2015-01-13 DIAGNOSIS — Z7409 Other reduced mobility: Secondary | ICD-10-CM

## 2015-01-13 NOTE — Therapy (Signed)
[facility] [address] [place], [place], [address] Phone: [phone]   Fax:  [phone]  Speech Language Pathology Evaluation  Patient Details  Name: Rodney Crane MRN: [id number] Date of Birth: May 17, 1950 Referring Provider:  Marton Redwood, MD  Encounter Date: 01/13/2015      End of Session - 01/13/15 1008    Visit Number 1   Number of Visits 1   SLP Start Time 0933   SLP Stop Time  4163   SLP Time Calculation (min) 35 min   Activity Tolerance Patient tolerated treatment well      Past Medical History  Diagnosis Date  . Arthritis   . Diabetes mellitus   . Hyperlipidemia   . Hypertension   . CVA (cerebral infarction) 2010  . Foot drop   . Obesity   . ED (erectile dysfunction)   . Unsteady gait   . Spinal stenosis   . Colon polyps   . H/O Bell's palsy   . OSA on CPAP   . Dizziness   . Stroke     Past Surgical History  Procedure Laterality Date  . Lumbar disc surgery      x6  . Cervical discectomy  2004  . Carpal tunnel release      bilateral    There were no vitals filed for this visit.  Visit Diagnosis: Dysarthria due to cerebrovascular accident      Subjective Assessment - 01/13/15 0940    Subjective "I was wondering if I needed speech therapy or not." "I'm more conscious now, and I take more time with talking."            SLP Evaluation [facility] - 01/13/15 0942    SLP Visit Information   SLP Received On 01/13/15   Onset Date August 2016 (diagnosed 12-04-14)   Medical Diagnosis CVA   Pain Assessment   Currently in Pain? Yes   Pain Score 3    Pain Location Leg   Pain Orientation Right;Left   Pain Type Chronic pain   Pain Onset More than a month ago   Pain Frequency Constant   Pain Relieving Factors meds   Effect of Pain on Daily Activities limits walking sometimes   Prior Functional Status   Cognitive/Linguistic Baseline Within functional limits   Cognition   Overall  Cognitive Status Within Functional Limits for tasks assessed   Auditory Comprehension   Overall Auditory Comprehension Appears within functional limits for tasks assessed   Verbal Expression   Overall Verbal Expression Appears within functional limits for tasks assessed   Oral Motor/Sensory Function   Overall Oral Motor/Sensory Function Impaired   Labial ROM Within Functional Limits   Labial Strength Within Functional Limits   Labial Coordination WFL   Lingual ROM Reduced right;Reduced left  in sulci, mild sluggish movement rt to lt, lt to rt   Lingual Symmetry Abnormal symmetry right   Lingual Strength Within Functional Limits   Lingual Coordination WFL   Facial ROM Within Functional Limits   Velum Within Functional Limits   Overall Oral Motor/Sensory Function Denies s/s aspiration during POs. Pt was engaged in conversation for 15 minutes and read CVA education materials for approx 10 minutes (approx 25 minutes of pt speaking over total of 30 minutes) in order for SLP to assess possibility of degradation of speech intelligibility over time. None noted. Pt also reports he is back to baseline.   Motor Speech   Overall Motor Speech Appears within functional limits for  tasks assessed   Articulation Within functional limitis   Intelligibility Intelligible   Motor Planning Witnin functional limits                         SLP Education - 02/02/2015 [phone]    Education provided Yes   Education Details CVA education - risk factors and warning signs   Person(s) Educated Patient;Spouse   Methods Explanation;Handout   Comprehension Verbalized understanding              Plan - 2015/02/02 1008    Clinical Impression Statement Pt presents with speech/language WNL, and swallowing ability reportedly WNL. No f/u speech is warranted at this time.    Speech Therapy Frequency One time visit   SLP Home Exercise Plan SLP offered HEP for pt's minimal lingual ROM but pt declined    Consulted and Agree with Plan of Care Patient          G-Codes - 2015-02-02 1011    Functional Assessment Tool Used noms   Functional Limitations Motor speech   Motor Speech Current Status [phone]) 0 percent impaired, limited or restricted   Motor Speech Goal Status (F7494) 0 percent impaired, limited or restricted   Motor Speech Goal Status (W9675) 0 percent impaired, limited or restricted      Problem List Patient Active Problem List   Diagnosis Date Noted  . DYSPNEA ON EXERTION 10/06/2009  . Nonspecific (abnormal) findings on radiological and other examination of body structure 10/06/2009  . CT, CHEST, ABNORMAL 10/06/2009  . COLONIC POLYPS 10/05/2009  . DIABETES, TYPE 2 10/05/2009  . HYPERLIPIDEMIA 10/05/2009  . 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 HYPERTENSION 10/05/2009  . CVA 10/05/2009  . ERECTILE DYSFUNCTION, 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/2009    [facility] , MS, CCC-SLP  02/02/15, 10:12 AM  [facility] [address] [place], [place], [address] Phone: [phone]   Fax:  [phone]

## 2015-01-13 NOTE — Therapy (Signed)
[facility] [address] [place], [place], [address] Phone: [phone]   Fax:  [phone]  Occupational Therapy Evaluation  Patient Details  Name: Rodney Crane MRN: [id number] Date of Birth: [birth year] Referring Provider:  Marton Redwood, MD  Encounter Date: 01/13/2015      OT End of Session - 01/13/15 0920    Visit Number 1   Number of Visits 8   Date for OT Re-Evaluation 02/11/15   Authorization Type MCR primary (BC/BS secondary) - G CODE   OT Start Time 0830   OT Stop Time 0920   OT Time Calculation (min) 50 min   Activity Tolerance Patient tolerated treatment well      Past Medical History  Diagnosis Date  . Arthritis   . Diabetes mellitus   . Hyperlipidemia   . Hypertension   . CVA (cerebral infarction) 2010  . Foot drop   . Obesity   . ED (erectile dysfunction)   . Unsteady gait   . Spinal stenosis   . Colon polyps   . H/O Bell's palsy   . OSA on CPAP   . Dizziness   . Stroke     Past Surgical History  Procedure Laterality Date  . Lumbar disc surgery      x6  . Cervical discectomy  2004  . Carpal tunnel release      bilateral    There were no vitals filed for this visit.  Visit Diagnosis:  Weakness of right hand - Plan: Ot plan of care cert/re-cert  Weakness of right arm - Plan: Ot plan of care cert/re-cert  Decreased functional mobility and endurance - Plan: Ot plan of care cert/re-cert      Subjective Assessment - 01/13/15 0839    Subjective  The only reason I knew something was wrong is my tongue felt fat and numb and lips went numb   Patient is accompained by: Family member  wife - Gwyn   Pertinent History HTN, DM, DJD, Spinal stenosis, approx. 7 back surgeries (1 cervical, 1 thoracic, 5 lumbar)   Limitations fall risk   Patient Stated Goals to get better with writing   Currently in Pain? Yes  O.T. will not be addressing secondary to outside scope of practice, chronic  premorbid   Pain Score 3    Pain Location Leg  and feet - from premorbid back problems   Pain Orientation Right;Left   Pain Descriptors / Indicators Numbness;Tingling   Pain Type Chronic pain   Pain Onset More than a month ago   Pain Frequency Constant   Aggravating Factors  nothing   Pain Relieving Factors pain meds           [facility] OT Assessment - 01/13/15 0001    Assessment   Diagnosis CVA   Onset Date --  12/04/14 MRI revealed CVA   Prior Therapy NONE   Precautions   Precautions Fall   Balance Screen   Has the patient fallen in the past 6 months No  But wife reports near falls   Has the patient had a decrease in activity level because of a fear of falling?  No   Is the patient reluctant to leave their home because of a fear of falling?  No   Home  Environment   Bathroom Shower/Tub Tub/Shower [employer] - single point   Additional Comments lives in 1 story home, level entry.    Lives With Spouse   Prior  Function   Level of Independence Independent   Vocation Retired   ADL   ADL comments Independent with all BADLS. Independent with cooking, cleaning, taking out trash. (Has not returned to [facility])   Mobility   Mobility Status Independent   Written Expression   Dominant Hand Right   Handwriting --  denies change in legibility, just gets tight   Vision - History   Baseline Vision Bifocals   Visual History --  none   Additional Comments reports bluriness from CVA, denies diplopia   Sensation   Stereognosis Appears Intact  5/5 objects id   Additional Comments Pt just reports numbness in fingertips and tightness in Rt hand   Coordination   9 Hole Peg Test Right;Left   Right 9 Hole Peg Test 27.59 sec.    Left 9 Hole Peg Test 30.22 sec   ROM / Strength   AROM / PROM / Strength AROM;Strength   AROM   Overall AROM Comments BUE AROM WNL's except Rt forearm supination 90%   Strength   Overall Strength Comments Rt sh. flex 4/5, all other sh.  5/5 grossly.    Hand Function   Right Hand Grip (lbs) 87 lbs   Left Hand Grip (lbs) 98 lbs                              OT Long Term Goals - 02/09/2015 0929    OT LONG TERM GOAL #1   Title Independent with HEP (DUE 02/11/15)   Time 4   Period Weeks   Status New   OT LONG TERM GOAL #2   Title Grip strength Rt hand to increase by 5 lbs or greater    Baseline 87 lbs (Lt = 98 lbs)   Time 4   Period Weeks   Status New   OT LONG TERM GOAL #3   Title Pt to improve coordination as evidenced by reducing speed on 9 hole peg test to 25 sec. or less   Baseline 27.59 sec   Time 4   Period Weeks   Status New               Plan - 02/09/2015 0925    Clinical Impression Statement Pt is a 64 y.o. male who presents to outpatient rehab s/p mild CVA with mild RUE weakness and sensory changes. Pt also with PMH of back surgeries and bilateral LE weakness/parathesias.    Pt will benefit from skilled therapeutic intervention in order to improve on the following deficits (Retired) Decreased coordination;Decreased endurance;Impaired sensation;Decreased activity tolerance;Impaired UE functional use;Decreased strength;Decreased mobility;Impaired vision/preception   Rehab Potential Excellent   OT Frequency 2x / week   OT Duration 4 weeks  prn (anticipate only 2x/wk for 2 weeks, or 1x/wk for 4 weeks)   OT Treatment/Interventions Patient/family education;Therapeutic exercise;Self-care/ADL training;Manual Therapy;Neuromuscular education;DME and/or AE instruction;Therapeutic activities;Passive range of motion;Moist Heat;Functional Mobility Training   Plan HEP for grip strength, Rt shoulder strength, and high level coordination, sensory feedback   Consulted and Agree with Plan of Care Patient;Family member/caregiver   Family Member Consulted Wife Rebekah [facility])          G-Codes - 2015-02-09 1610    Functional Assessment Tool Used RUE: 9 hole peg test = 27.59 sec., Grip = 87 lbs (Lt = 98 lbs)    Functional Limitation Carrying, moving and handling objects   Carrying, Moving and Handling Objects Current Status (R6045) At least 1 percent but  less than 20 percent impaired, limited or restricted   Carrying, Moving and Handling Objects Goal Status (B0175) 0 percent impaired, limited or restricted      Problem List Patient Active Problem List   Diagnosis Date Noted  . DYSPNEA ON EXERTION 10/06/2009  . Nonspecific (abnormal) findings on radiological and other examination of body structure 10/06/2009  . CT, CHEST, ABNORMAL 10/06/2009  . COLONIC POLYPS 10/05/2009  . DIABETES, TYPE 2 10/05/2009  . HYPERLIPIDEMIA 10/05/2009  . 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 HYPERTENSION 10/05/2009  . CVA 10/05/2009  . ERECTILE DYSFUNCTION, 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/2009    Carey Bullocks, OTR/L 01/13/2015, 9:33 AM  [facility] [address] [place], [place], [address] Phone: [phone]   Fax:  [phone]

## 2015-01-13 NOTE — Therapy (Signed)
[facility] [address] [place], [place], [address] Phone: [phone]   Fax:  [phone]  Physical Therapy Evaluation  Patient Details  Name: Rodney Crane MRN: [id number] Date of Birth: 1951-02-21 Referring Provider:  Marton Redwood, MD  Encounter Date: 01/13/2015      PT End of Session - 01/13/15 1115    Visit Number 1   Number of Visits 9   Date for PT Re-Evaluation 03/14/15   Authorization Type Medicare G code every 10th visit   PT Start Time 1020   PT Stop Time 1056   PT Time Calculation (min) 36 min   Equipment Utilized During Treatment Gait belt   Activity Tolerance Patient tolerated treatment well   Behavior During Therapy [facility] for tasks assessed/performed      Past Medical History  Diagnosis Date  . Arthritis   . Diabetes mellitus   . Hyperlipidemia   . Hypertension   . CVA (cerebral infarction) 2010  . Foot drop   . Obesity   . ED (erectile dysfunction)   . Unsteady gait   . Spinal stenosis   . Colon polyps   . H/O Bell's palsy   . OSA on CPAP   . Dizziness   . Stroke     Past Surgical History  Procedure Laterality Date  . Lumbar disc surgery      x6  . Cervical discectomy  2004  . Carpal tunnel release      bilateral    There were no vitals filed for this visit.  Visit Diagnosis:  Abnormality of gait  Weakness of right lower extremity      Subjective Assessment - 01/13/15 1024    Subjective Pt is a 64 year old right-handed gentleman with a history of recent CVA in August 2016.  Pt notes increased RLE weakness since CVA.  Pt has some residual weakness in R LE and R foot due to previous back surgeries and back issues.  Pt reports R foot drop, difficulty clearing R foot from floor with gait, somewhat worse since CVA.  Pt has not had any falls, but several near falls, where he reaches out for walls.  He has a cane, which he uses for longer distances and outdoors.   Pertinent History  underlying medical history of hypertension, smoking, hyperlipidemia, type 2 diabetes, degenerative back disease, s/p 7 back surgeris, and stroke (incidental finding on an MRI per patient), who presents for a new problem and is referred by his primary care physician for gait instability, and foot drop as well as a history of spinal stenosis and recent/incidental stroke.    Patient Stated Goals Pt's goals for therapy are to be able to walk better.   Currently in Pain? Yes   Pain Score 3    Pain Location Leg   Pain Orientation Right;Left   Pain Descriptors / Indicators Numbness;Tingling   Pain Type Chronic pain  PT will not address pain due to pain being chronic in nature   Pain Onset More than a month ago   Pain Frequency Constant   Aggravating Factors  nothing   Pain Relieving Factors meds   Effect of Pain on Daily Activities limits walking sometimes            [facility] PT Assessment - 01/13/15 1037    Assessment   Medical Diagnosis gait instability, CVA   Onset Date/Surgical Date --  CVA 11/2014   Precautions   Precautions Fall   Balance Screen   Has  the patient fallen in the past 6 months No  Near-misses per pt/wife report   Has the patient had a decrease in activity level because of a fear of falling?  No   Is the patient reluctant to leave their home because of a fear of falling?  No   Home Ecologist residence   Living Arrangements Spouse/significant other   Available Help at Discharge Family   Type of [place] One level   [place] - single point   Prior Function   Level of [place] Retired   Observation/Other Assessments   Focus on Therapeutic Outcomes ([place])  [place] office staff did not capture   ROM / Strength   AROM / PROM / Strength Strength   Strength   Overall Strength Comments Grossly tested 5/5 LLE, 4/5 RLE hip flexion, knee extension, knee flexion, ankle dorsiflexion    Transfers   Transfers Sit to Stand;Stand to Sit   Sit to Stand 6: Modified independent (Device/Increase time);With upper extremity assist;From chair/3-in-1;With armrests   Stand to Sit 6: Modified independent (Device/Increase time);With upper extremity assist;To chair/3-in-1;With armrests   Ambulation/Gait   Ambulation/Gait Yes   Ambulation/Gait Assistance 5: Supervision   Ambulation/Gait Assistance Details [place] on RLE, RLE externally rotated   Ambulation Distance (Feet) 200 Feet   Assistive device None   Gait Pattern Step-through pattern;Decreased step length - right;Decreased dorsiflexion - right;Trendelenburg;Decreased arm swing - right;Decreased arm swing - left   Ambulation Surface Level;Indoor   Gait velocity 13.45 sec = 2.44 ft/sec   Standardized Balance Assessment   Standardized Balance Assessment Timed Up and Go Test;Dynamic Gait Index   Dynamic Gait Index   Level Surface Moderate Impairment   Change in Gait Speed Mild Impairment   Gait with Horizontal Head Turns Moderate Impairment   Gait with Vertical Head Turns Mild Impairment   Gait and Pivot Turn Mild Impairment  3.10 sec   Step Over Obstacle Mild Impairment   Step Around Obstacles Mild Impairment   Steps Mild Impairment   Total Score 14   DGI comment: Scores <19/24 are indicative of increased fall risk   Timed Up and Go Test   Normal TUG (seconds) 16.31                                PT Long Term Goals - 01/13/15 1120    PT LONG TERM GOAL #1   Title Pt will be independent with HEP for improved strength, balance and gait.   Time 4   Period Weeks   Status New   PT LONG TERM GOAL #2   Title Pt will improve TUG score to less than or equal to 13.5 seconds for decreased fall risk.   Time 4   Period Weeks   Status New   PT LONG TERM GOAL #3   Title Pt will improve Dynamic Gait Index to at least 17/24 for decreased fall risk.   Period Weeks   Status New   PT LONG TERM GOAL #4    Title Pt will ambulate at least 500 ft, indoor/outdoor surfaces with cane, modified independently, for improved community gait.   Time 4   Period Weeks   Status New   PT LONG TERM GOAL #5   Title Pt will verbalize understanding of fall prevention within the home environment.   Time 4   Period  Weeks   Status New               Plan - 2015-01-23 1116    Clinical Impression Statement Pt is a 64 year old gentleman with history of recent CVA with R sided weakness.  He has long standing history of back issues, including spinal stenosis and multiple back surgeries, which have left residual R sided weakness.  Both patient and wife feel that he is having increased R sided weakness since CVA in August 2016.  Pt presents with decrease RLE strength, decreased balance, decreased gait stability; pt is at fall risk per Dynamic Gait Index and TUG scores.  Pt would benefit from skilled PT to address the previously stated deficits.   Pt will benefit from skilled therapeutic intervention in order to improve on the following deficits Abnormal gait;Decreased balance;Decreased mobility;Decreased strength;Difficulty walking;Impaired sensation   Rehab Potential Good   PT Frequency 2x / week   PT Duration 4 weeks  plus eval   PT Treatment/Interventions ADLs/Self Care Home Management;Gait training;Therapeutic activities;Therapeutic exercise;Orthotic Fit/Training;Balance training;Neuromuscular re-education;Patient/family education   PT Next Visit Plan Trial various orthotics for R foot drop with gait; initiate HEP for hip abductor strengthening   Consulted and Agree with Plan of Care Patient;Family member/caregiver   Family Member Consulted wife          G-Codes - 2015/01/23 1125    Functional Assessment Tool Used TUG 16.31 seconds, DGI 14/24, gait velocity 2.44 ft/sec   Functional Limitation Mobility: Walking and moving around   Mobility: Walking and Moving Around Current Status [phone]) At least 20 percent  but less than 40 percent impaired, limited or restricted   Mobility: Walking and Moving Around Goal Status [id number]) At least 1 percent but less than 20 percent impaired, limited or restricted       Problem List Patient Active Problem List   Diagnosis Date Noted  . DYSPNEA ON EXERTION 10/06/2009  . Nonspecific (abnormal) findings on radiological and other examination of body structure 10/06/2009  . CT, CHEST, ABNORMAL 10/06/2009  . COLONIC POLYPS 10/05/2009  . DIABETES, TYPE 2 10/05/2009  . HYPERLIPIDEMIA 10/05/2009  . 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 HYPERTENSION 10/05/2009  . CVA 10/05/2009  . ERECTILE DYSFUNCTION, 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/2009    MARRIOTT,AMY W. Jan 23, 2015, 11:25 AM  Frazier Butt., PT  [facility] [address] [place], [place], [address] Phone: [phone]   Fax:  [phone]

## 2015-01-16 ENCOUNTER — Ambulatory Visit: Payer: Medicare Other | Admitting: Neurology

## 2015-01-16 DIAGNOSIS — R0789 Other chest pain: Secondary | ICD-10-CM | POA: Diagnosis not present

## 2015-01-20 ENCOUNTER — Ambulatory Visit: Payer: Medicare Other | Admitting: Physical Therapy

## 2015-01-20 ENCOUNTER — Ambulatory Visit: Payer: Medicare Other | Admitting: Occupational Therapy

## 2015-01-20 DIAGNOSIS — Z7409 Other reduced mobility: Secondary | ICD-10-CM | POA: Diagnosis not present

## 2015-01-20 DIAGNOSIS — M6289 Other specified disorders of muscle: Secondary | ICD-10-CM | POA: Diagnosis not present

## 2015-01-20 DIAGNOSIS — R29898 Other symptoms and signs involving the musculoskeletal system: Secondary | ICD-10-CM

## 2015-01-20 DIAGNOSIS — R269 Unspecified abnormalities of gait and mobility: Secondary | ICD-10-CM

## 2015-01-20 DIAGNOSIS — I69922 Dysarthria following unspecified cerebrovascular disease: Secondary | ICD-10-CM | POA: Diagnosis not present

## 2015-01-20 NOTE — Patient Instructions (Signed)
Abduction: Clam (Eccentric) - Side-Lying   Lie on side with knees bent. Lift top knee, keeping feet together. Keep trunk steady. Slowly lower down. __10_ reps per set, _2__ sets per day Copyright  VHI. All rights reserved.    Extension: Bridging - Supine   Lie with hips and knees bent, feet flat. Tighten stomach muscles, then squeeze through buttocks.  Lift hips off surface and hold 3 seconds.  Repeat _10___ times per set. Do _2__ sets per session.   Copyright  VHI. All rights reserved.   Lumbar Rotation: Caudal - Bilateral (Supine)   Feet and knees together, arms outstretched, rotate knees left, turning head in opposite direction, until gentle stretch in low back/trunk is felt. Hold _30___ seconds. Relax. Repeat _3___ times to each side.   [email or web address].[email or web address]   Copyright  VHI. All rights reserved.

## 2015-01-20 NOTE — Therapy (Signed)
[facility] [address] [place], [place], [address] Phone: [phone]   Fax:  [phone]  Physical Therapy Treatment  Patient Details  Name: Rodney Crane MRN: [id number] Date of Birth: 1950-09-01 Referring Provider:  Marton Redwood, MD  Encounter Date: 01/20/2015      PT End of Session - 01/20/15 0846    Visit Number 2   Number of Visits 9   Date for PT Re-Evaluation 03/14/15   Authorization Type Medicare G code every 10th visit   PT Start Time 0805   PT Stop Time 0846   PT Time Calculation (min) 41 min   Activity Tolerance Patient tolerated treatment well   Behavior During Therapy [facility] for tasks assessed/performed      Past Medical History  Diagnosis Date  . Arthritis   . Diabetes mellitus   . Hyperlipidemia   . Hypertension   . CVA (cerebral infarction) 2010  . Foot drop   . Obesity   . ED (erectile dysfunction)   . Unsteady gait   . Spinal stenosis   . Colon polyps   . H/O Bell's palsy   . OSA on CPAP   . Dizziness   . Stroke     Past Surgical History  Procedure Laterality Date  . Lumbar disc surgery      x6  . Cervical discectomy  2004  . Carpal tunnel release      bilateral    There were no vitals filed for this visit.  Visit Diagnosis:  Abnormality of gait  Weakness of right lower extremity      Subjective Assessment - 01/20/15 0806    Subjective No changes since evaluation.   Currently in Pain? No/denies                         [facility] Adult PT Treatment/Exercise - 01/20/15 0807    Ambulation/Gait   Ambulation/Gait Yes   Ambulation/Gait Assistance 5: Supervision   Ambulation/Gait Assistance Details Trial of orthotics on RLE this session; gait x 120 ft with blue rocker, then 120 ft with toe- off brace; 120 ft with foot-up brace   Assistive device None   Gait Pattern Step-through pattern;Decreased step length - right;Decreased dorsiflexion -  right;Trendelenburg;Decreased arm swing - right;Decreased arm swing - left  Improved step length with toe-off and with foot-up brace   Ambulation Surface Level;Unlevel   Gait velocity 12.71 sec (2.58 ft/sec)toe off; 13.02 sec (2.52 ft/sec) with foot-up   Gait Comments Pt appears to have more natural gait pattern with R foot-up brace.  Continues to have Trendelenburg pattern with gait   Exercises   Exercises Knee/Hip   Knee/Hip Exercises: Supine   Other Supine Knee/Hip Exercises supine hooklying trunk rotation 3 x 30 seconds   Knee/Hip Exercises: Sidelying   Hip ABduction AROM;Left;10 reps;1 set   Clams RLE  2 sets x 10, LLE 1 set x 10         Self Care:  Discussed options for AFO, including toe-off brace and foot-up brace.  Pt and PT feel that foot-up brace is optimal for patient at this time, as it assists with improved R foot clearance and more natural gait pattern.  Provided patient information from [facility] website regarding ordering foot-up brace.  Pt will look at pricing for this and decide if he will order versus getting MD order for foot-up brace through orthotist.         PT Education - 01/20/15  0845    Education provided Yes   Education Details HEP, orthotic trial; plans for obtaining foot-up brace   Person(s) Educated Patient   Methods Explanation;Demonstration;Handout   Comprehension Verbalized understanding;Returned demonstration             PT Long Term Goals - 01/13/15 1120    PT LONG TERM GOAL #1   Title Pt will be independent with HEP for improved strength, balance and gait.   Time 4   Period Weeks   Status New   PT LONG TERM GOAL #2   Title Pt will improve TUG score to less than or equal to 13.5 seconds for decreased fall risk.   Time 4   Period Weeks   Status New   PT LONG TERM GOAL #3   Title Pt will improve Dynamic Gait Index to at least 17/24 for decreased fall risk.   Period Weeks   Status New   PT LONG TERM GOAL #4   Title Pt will ambulate at  least 500 ft, indoor/outdoor surfaces with cane, modified independently, for improved community gait.   Time 4   Period Weeks   Status New   PT LONG TERM GOAL #5   Title Pt will verbalize understanding of fall prevention within the home environment.   Time 4   Period Weeks   Status New               Plan - 01/20/15 1159    Clinical Impression Statement Trial of orthotics was primary focus during today's session, with noted increased RLE step length with toe-off AFO and with foot-up brace.  Blue rocker was trialed, but not considered further due to increased R knee recurvatum.  Pt notes that he feels most comfortable with foot-up brace with gait and would like to pursue this.  Provided information on how to order online (pt may consider MD order to go through orthotist as well.)  Initiated HEP for hip strengthening.  Pt will continue to benefit from further skilled PT to address strength, balance, and gait.   Pt will benefit from skilled therapeutic intervention in order to improve on the following deficits Abnormal gait;Decreased balance;Decreased mobility;Decreased strength;Difficulty walking;Impaired sensation   Rehab Potential Good   PT Frequency 2x / week   PT Duration 4 weeks  plus eval   PT Treatment/Interventions ADLs/Self Care Home Management;Gait training;Therapeutic activities;Therapeutic exercise;Orthotic Fit/Training;Balance training;Neuromuscular re-education;Patient/family education   PT Next Visit Plan Gait with foot-up brace (follow up with patient if order needs to be obtained from MD); review hip abductor strengthening; progress hip strength, single limb balance activities   Consulted and Agree with Plan of Care Patient   Family Member Consulted wife        Problem List Patient Active Problem List   Diagnosis Date Noted  . DYSPNEA ON EXERTION 10/06/2009  . Nonspecific (abnormal) findings on radiological and other examination of body structure 10/06/2009  .  CT, CHEST, ABNORMAL 10/06/2009  . COLONIC POLYPS 10/05/2009  . DIABETES, TYPE 2 10/05/2009  . HYPERLIPIDEMIA 10/05/2009  . 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 HYPERTENSION 10/05/2009  . CVA 10/05/2009  . ERECTILE DYSFUNCTION, 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/2009    MARRIOTT,AMY W. 01/20/2015, 12:15 PM  Frazier Butt., PT  [facility] [address] [place], [place], [address] Phone: [phone]   Fax:  [phone]

## 2015-01-20 NOTE — Patient Instructions (Signed)
1. Grip Strengthening (Resistive Putty)   Squeeze putty using thumb and all fingers. Repeat _20___ times. Do __2__ sessions per day.   2. Roll putty into tube on table and pinch between each finger and thumb x 10 reps each. (can do ring and small finger together)      Coordination Activities  Perform the following activities for 10 minutes 15 times per day with right hand(s).   Rotate ball in fingertips (clockwise and counter-clockwise).  Pick up coins one at a time until you get 5-10 in your hand, then move coins from palm to fingertips to stack one at a time.     Strengthening: Resisted Flexion   Hold tubing with _____ arm(s) at side. Pull forward and up. Move shoulder through pain-free range of motion. Repeat __10__ times per set.  Do _1-2_ sessions per day , every other day   Strengthening: Resisted Extension   Hold tubing in _____ hand(s), arm forward. Pull arm back, elbow straight. Repeat _10___ times per set. Do _1-2___ sessions per day, every other day.   Resisted Horizontal Abduction: Bilateral   Sit or stand, tubing in both hands, arms out in front. Keeping arms straight, pinch shoulder blades together and stretch arms out. Repeat _10___ times per set. Do _1-2___ sessions per day, every other day.

## 2015-01-20 NOTE — Therapy (Signed)
[facility] [address] [place], [place], [address] Phone: [phone]   Fax:  [phone]  Occupational Therapy Treatment  Patient Details  Name: Rodney Crane MRN: [id number] Date of Birth: 1950-10-06 Referring Provider:  Marton Redwood, MD  Encounter Date: 01/20/2015      OT End of Session - 01/20/15 1001    Visit Number 2   Number of Visits 8   Date for OT Re-Evaluation 02/11/15   Authorization Type MCR primary (BC/BS secondary) - G CODE   OT Start Time 0845   OT Stop Time 0930   OT Time Calculation (min) 45 min   Activity Tolerance Patient tolerated treatment well      Past Medical History  Diagnosis Date  . Arthritis   . Diabetes mellitus   . Hyperlipidemia   . Hypertension   . CVA (cerebral infarction) 2010  . Foot drop   . Obesity   . ED (erectile dysfunction)   . Unsteady gait   . Spinal stenosis   . Colon polyps   . H/O Bell's palsy   . OSA on CPAP   . Dizziness   . Stroke     Past Surgical History  Procedure Laterality Date  . Lumbar disc surgery      x6  . Cervical discectomy  2004  . Carpal tunnel release      bilateral    There were no vitals filed for this visit.  Visit Diagnosis:  Weakness of right hand  Weakness of right arm      Subjective Assessment - 01/20/15 0849    Subjective  I'm doing fine   Pertinent History HTN, DM, DJD, Spinal stenosis, approx. 7 back surgeries (1 cervical, 1 thoracic, 5 lumbar)   Limitations fall risk   Patient Stated Goals to get better with writing   Currently in Pain? No/denies                      OT Treatments/Exercises (OP) - 01/20/15 0001    Exercises   Exercises Hand;Shoulder   Shoulder Exercises: ROM/Strengthening   Other ROM/Strengthening Exercises see pt instructions for details    Hand Exercises   Other Hand Exercises see pt instructions - pt issued blue putty for grip and pinch strength   Fine Motor  Coordination   Other Fine Motor Exercises see pt instructions - (rotating ball in fingertips and manipulating coins for in hand translation)                OT Education - 01/20/15 0928    Education provided Yes   Education Details HEP (for RUE strength, hand strength, coordination)   Person(s) Educated Patient   Methods Explanation;Demonstration;Handout   Comprehension Verbalized understanding;Returned demonstration             OT Long Term Goals - 01/13/15 0929    OT LONG TERM GOAL #1   Title Independent with HEP (DUE 02/11/15)   Time 4   Period Weeks   Status New   OT LONG TERM GOAL #2   Title Grip strength Rt hand to increase by 5 lbs or greater    Baseline 87 lbs (Lt = 98 lbs)   Time 4   Period Weeks   Status New   OT LONG TERM GOAL #3   Title Pt to improve coordination as evidenced by reducing speed on 9 hole peg test to 25 sec. or less   Baseline 27.59 sec  Time 4   Period Weeks   Status New               Plan - 01/20/15 1002    Clinical Impression Statement Pt approximating LTG #1. Pt tolerating all ex's well   Plan review HEP prn, gripper activity for Rt hand, sensory feedback retraining, RUE strengthening/endurance   Consulted and Agree with Plan of Care Patient        Problem List Patient Active Problem List   Diagnosis Date Noted  . DYSPNEA ON EXERTION 10/06/2009  . Nonspecific (abnormal) findings on radiological and other examination of body structure 10/06/2009  . CT, CHEST, ABNORMAL 10/06/2009  . COLONIC POLYPS 10/05/2009  . DIABETES, TYPE 2 10/05/2009  . HYPERLIPIDEMIA 10/05/2009  . 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 HYPERTENSION 10/05/2009  . CVA 10/05/2009  . ERECTILE DYSFUNCTION, 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/2009    Carey Bullocks, OTR/L 01/20/2015, 10:05 AM  [facility] [address] [place], [place], [address] Phone: [phone]   Fax:  [phone]

## 2015-01-22 ENCOUNTER — Ambulatory Visit: Payer: Medicare Other | Admitting: Physical Therapy

## 2015-01-22 ENCOUNTER — Ambulatory Visit: Payer: Medicare Other | Admitting: Occupational Therapy

## 2015-01-22 DIAGNOSIS — R29898 Other symptoms and signs involving the musculoskeletal system: Secondary | ICD-10-CM | POA: Diagnosis not present

## 2015-01-22 DIAGNOSIS — R269 Unspecified abnormalities of gait and mobility: Secondary | ICD-10-CM

## 2015-01-22 DIAGNOSIS — I69922 Dysarthria following unspecified cerebrovascular disease: Secondary | ICD-10-CM | POA: Diagnosis not present

## 2015-01-22 DIAGNOSIS — M6289 Other specified disorders of muscle: Secondary | ICD-10-CM | POA: Diagnosis not present

## 2015-01-22 DIAGNOSIS — Z7409 Other reduced mobility: Secondary | ICD-10-CM | POA: Diagnosis not present

## 2015-01-22 NOTE — Patient Instructions (Signed)
PROGRESSING EXERCISES FROM PREVIOUS VISIT: -Increase sets for bridging and sidelying clamshell to 3 sets of 10 reps -Can progress to using red theraband for sidelying clamshell  Tandem Stance   Right foot in front of left, heel touching toe both feet "straight ahead". Stand on Foot Triangle of Support with both feet. Hold counter for light support as needed.  Balance in this position __10_ seconds.  3 repetitions each leg. Do with left foot in front of right.  Copyright  VHI. All rights reserved.  SINGLE LIMB STANCE   Stance: single leg on floor. Raise leg. Hold onto counter for light support.  Hold _10__ seconds. Repeat with other leg. __3_ reps per set.   Copyright  VHI. All rights reserved.   Gradually progress to not holding on, as long as you are steady.

## 2015-01-22 NOTE — Therapy (Signed)
[facility] [address] [place], [place], [address] Phone: [phone]   Fax:  [phone]  Occupational Therapy Treatment  Patient Details  Name: Rodney Crane MRN: [id number] Date of Birth: 07-08-1950 Referring Provider:  Marton Redwood, MD  Encounter Date: 01/22/2015      OT End of Session - 01/22/15 1157    Visit Number 3   Number of Visits 8   Date for OT Re-Evaluation 02/11/15   Authorization Type MCR primary (BC/BS secondary) - G CODE   OT Start Time 0935   OT Stop Time 1015   OT Time Calculation (min) 40 min   Activity Tolerance Patient tolerated treatment well      Past Medical History  Diagnosis Date  . Arthritis   . Diabetes mellitus   . Hyperlipidemia   . Hypertension   . CVA (cerebral infarction) 2010  . Foot drop   . Obesity   . ED (erectile dysfunction)   . Unsteady gait   . Spinal stenosis   . Colon polyps   . H/O Bell's palsy   . OSA on CPAP   . Dizziness   . Stroke     Past Surgical History  Procedure Laterality Date  . Lumbar disc surgery      x6  . Cervical discectomy  2004  . Carpal tunnel release      bilateral    There were no vitals filed for this visit.  Visit Diagnosis:  Weakness of right hand      Subjective Assessment - 01/22/15 0940    Pertinent History HTN, DM, DJD, Spinal stenosis, approx. 7 back surgeries (1 cervical, 1 thoracic, 5 lumbar)   Limitations fall risk   Patient Stated Goals to get better with writing   Currently in Pain? No/denies                      OT Treatments/Exercises (OP) - 01/22/15 0001    ADLs   Writing Pt practiced writing with regular pen then with larger pen. Pt with no significant difference in legibility, but built up pen (foam) did help with tightness/cramping for longer writing. Issued tan and red foam   Hand Exercises   Other Hand Exercises Gripper set at 75 lbs. resistance to pick up blocks Rt hand for  sustained grip strength with min drops/difficulty   Sensation Exercises   Sensory Retraining Pt shown activity for sensory retraining and stereognosis (with container of rice with various small objects) and told how to use at home                     OT Long Term Goals - 01/22/15 1200    OT LONG TERM GOAL #1   Title Independent with HEP (DUE 02/11/15)   Time 4   Period Weeks   Status Achieved   OT LONG TERM GOAL #2   Title Grip strength Rt hand to increase by 5 lbs or greater    Baseline 87 lbs (Lt = 98 lbs)   Time 4   Period Weeks   Status On-going   OT LONG TERM GOAL #3   Title Pt to improve coordination as evidenced by reducing speed on 9 hole peg test to 25 sec. or less   Baseline 27.59 sec   Time 4   Period Weeks   Status On-going               Plan - 01/22/15 1158  Clinical Impression Statement Pt met LTG #1. Approximating remaining goals   Plan continue coordination, UB strengthening, UBE, begin assessing remaining LTG's in prep for d/c    OT Home Exercise Plan 01/20/15: HEP   Consulted and Agree with Plan of Care Patient        Problem List Patient Active Problem List   Diagnosis Date Noted  . DYSPNEA ON EXERTION 10/06/2009  . Nonspecific (abnormal) findings on radiological and other examination of body structure 10/06/2009  . CT, CHEST, ABNORMAL 10/06/2009  . COLONIC POLYPS 10/05/2009  . DIABETES, TYPE 2 10/05/2009  . HYPERLIPIDEMIA 10/05/2009  . 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 HYPERTENSION 10/05/2009  . CVA 10/05/2009  . ERECTILE DYSFUNCTION, 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/2009    Carey Bullocks, OTR/L 01/22/2015, 12:01 PM  [facility] [address] [place], [place], [address] Phone: [phone]   Fax:  [phone]

## 2015-01-22 NOTE — Therapy (Signed)
[facility] [address] [place], [place], [address] Phone: [phone]   Fax:  [phone]  Physical Therapy Treatment  Patient Details  Name: Rodney Crane MRN: [id number] Date of Birth: 1950-07-28 Referring Provider:  Marton Redwood, MD  Encounter Date: 01/22/2015      PT End of Session - 01/22/15 1420    Visit Number 3   Number of Visits 9   Date for PT Re-Evaluation 03/14/15   Authorization Type Medicare G code every 10th visit   PT Start Time 0850   PT Stop Time 0934   PT Time Calculation (min) 44 min   Activity Tolerance Patient tolerated treatment well   Behavior During Therapy [facility] for tasks assessed/performed      Past Medical History  Diagnosis Date  . Arthritis   . Diabetes mellitus   . Hyperlipidemia   . Hypertension   . CVA (cerebral infarction) 2010  . Foot drop   . Obesity   . ED (erectile dysfunction)   . Unsteady gait   . Spinal stenosis   . Colon polyps   . H/O Bell's palsy   . OSA on CPAP   . Dizziness   . Stroke     Past Surgical History  Procedure Laterality Date  . Lumbar disc surgery      x6  . Cervical discectomy  2004  . Carpal tunnel release      bilateral    There were no vitals filed for this visit.  Visit Diagnosis:  Weakness of right lower extremity  Abnormality of gait      Subjective Assessment - 01/22/15 0852    Subjective Been busy travelling, went to see my brother in [place]   Currently in Pain? No/denies                         [facility] Adult PT Treatment/Exercise - 01/22/15 0858    Standardized Balance Assessment   Standardized Balance Assessment Balance Master Testing  Sensory Organization test performed   High Level Balance   High Level Balance Comments Single limb stance 3 reps x 10 seconds each leg, with UE support at counter, tandem stand 3 reps x 10 seconds with intermittent UE support at counter  Added to HEP   Knee/Hip Exercises: Supine   Other Supine Knee/Hip Exercises supine hooklying trunk rotation 3 x 30 seconds   Other Supine Knee/Hip Exercises bridging 2 sets x 10 reps   Knee/Hip Exercises: Sidelying   Clams Bilateral LE 2 sets x 10  Attempted red theraband resistance x 8 on R   Other Sidelying Knee/Hip Exercises Advised patient on progression of HEP-increasing to 3 sets of 10 reps, or using red theraband with sidelying clamshells       From above, Sensory Organization test performed, based on patient's complaint during SLS and tandem activities about "dysequilibrium".  Pt able to complete trials with eyes open on solid surface and eyes open with compliant surface.  Pt is not able to complete trails with eyes closed or with walls moving.  Pt requests not to complete last two sets of trials due to fear of falling/legs buckling.  Discussed pt's history of Bell's palsy as possible contributor to decreased vestibular system use for balance; discussed decreased sensation in RLE as possible contributor to pt's preference for solid surfaces.  Discussed the previous information as contributors to pt's heavy reliance on vision for balance.  Discussed need for additional points  of stability when eyes are closed (such as in church or in shower) as well as need for cane when on outdoor and unlevel surfaces.  PT phoned orthotist during session today and foot-up braces are not covered by insurance.  Pt agrees to order his foot-up brace as discussed last session.           PT Education - 01/22/15 1419    Education provided Yes   Education Details Sensory Organization test results, foot-up brace ordering; HEP additions   Person(s) Educated Patient   Methods Explanation;Handout;Demonstration   Comprehension Verbalized understanding;Returned demonstration             PT Long Term Goals - 01/13/15 1120    PT LONG TERM GOAL #1   Title Pt will be independent with HEP for improved strength, balance and  gait.   Time 4   Period Weeks   Status New   PT LONG TERM GOAL #2   Title Pt will improve TUG score to less than or equal to 13.5 seconds for decreased fall risk.   Time 4   Period Weeks   Status New   PT LONG TERM GOAL #3   Title Pt will improve Dynamic Gait Index to at least 17/24 for decreased fall risk.   Period Weeks   Status New   PT LONG TERM GOAL #4   Title Pt will ambulate at least 500 ft, indoor/outdoor surfaces with cane, modified independently, for improved community gait.   Time 4   Period Weeks   Status New   PT LONG TERM GOAL #5   Title Pt will verbalize understanding of fall prevention within the home environment.   Time 4   Period Weeks   Status New               Plan - 01/22/15 1421    Clinical Impression Statement Discussed and demonstrated progression of strengthening HEP and additions to HEP for hip stability in standing.  Based on pt's reports of dysequilibrium in standing activities, attempted Sensory Organization test.  Pt unable to complete; however, discussed compensations based on pt's heavy reliance on vision for balance.  Pt will continue to benefit from further progression with strengthening and balance to HEP with likely discharge in next several visits.   Pt will benefit from skilled therapeutic intervention in order to improve on the following deficits Abnormal gait;Decreased balance;Decreased mobility;Decreased strength;Difficulty walking;Impaired sensation   Rehab Potential Good   PT Frequency 2x / week   PT Duration 4 weeks  plus eval   PT Treatment/Interventions ADLs/Self Care Home Management;Gait training;Therapeutic activities;Therapeutic exercise;Orthotic Fit/Training;Balance training;Neuromuscular re-education;Patient/family education   PT Next Visit Plan Review standing HEP; follow up on foot-up brace order with patient; progress balance/strength and discuss possible D/C once brace received   Consulted and Agree with Plan of Care  Patient   Family Member Consulted wife        Problem List Patient Active Problem List   Diagnosis Date Noted  . DYSPNEA ON EXERTION 10/06/2009  . Nonspecific (abnormal) findings on radiological and other examination of body structure 10/06/2009  . CT, CHEST, ABNORMAL 10/06/2009  . COLONIC POLYPS 10/05/2009  . DIABETES, TYPE 2 10/05/2009  . HYPERLIPIDEMIA 10/05/2009  . 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 HYPERTENSION 10/05/2009  . CVA 10/05/2009  . ERECTILE DYSFUNCTION, 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/2009    MARRIOTT,AMY W. 01/22/2015, 2:24 PM  Frazier Butt., PT [facility] [address] [place], [place], [address] Phone:  [phone]   Fax:  [phone]

## 2015-01-23 DIAGNOSIS — E1140 Type 2 diabetes mellitus with diabetic neuropathy, unspecified: Secondary | ICD-10-CM | POA: Diagnosis not present

## 2015-01-23 DIAGNOSIS — I10 Essential (primary) hypertension: Secondary | ICD-10-CM | POA: Diagnosis not present

## 2015-01-23 DIAGNOSIS — G4733 Obstructive sleep apnea (adult) (pediatric): Secondary | ICD-10-CM | POA: Diagnosis not present

## 2015-01-23 DIAGNOSIS — R0789 Other chest pain: Secondary | ICD-10-CM | POA: Diagnosis not present

## 2015-01-27 ENCOUNTER — Ambulatory Visit: Payer: Medicare Other | Attending: Internal Medicine | Admitting: Rehabilitation

## 2015-01-27 ENCOUNTER — Encounter: Payer: Self-pay | Admitting: Rehabilitation

## 2015-01-27 ENCOUNTER — Ambulatory Visit: Payer: Medicare Other | Admitting: Occupational Therapy

## 2015-01-27 DIAGNOSIS — M6289 Other specified disorders of muscle: Secondary | ICD-10-CM | POA: Diagnosis not present

## 2015-01-27 DIAGNOSIS — Z7409 Other reduced mobility: Secondary | ICD-10-CM | POA: Insufficient documentation

## 2015-01-27 DIAGNOSIS — R279 Unspecified lack of coordination: Secondary | ICD-10-CM | POA: Diagnosis not present

## 2015-01-27 DIAGNOSIS — R269 Unspecified abnormalities of gait and mobility: Secondary | ICD-10-CM | POA: Diagnosis not present

## 2015-01-27 DIAGNOSIS — R29898 Other symptoms and signs involving the musculoskeletal system: Secondary | ICD-10-CM | POA: Insufficient documentation

## 2015-01-27 NOTE — Therapy (Signed)
[facility] [address] [place], [place], [address] Phone: [phone]   Fax:  [phone]  Physical Therapy Treatment  Patient Details  Name: Rodney Crane MRN: [id number] Date of Birth: Jun 24, 1950 Referring Provider:  Marton Redwood, MD  Encounter Date: 01/27/2015      PT End of Session - 01/27/15 1345    Visit Number 4   Number of Visits 9   Date for PT Re-Evaluation 03/14/15   Authorization Type Medicare G code every 10th visit   PT Start Time 1105  Pt seen for review of HEP only per pt request    PT Stop Time 1120   PT Time Calculation (min) 15 min   Activity Tolerance Patient tolerated treatment well   Behavior During Therapy [facility] for tasks assessed/performed      Past Medical History  Diagnosis Date  . Arthritis   . Diabetes mellitus   . Hyperlipidemia   . Hypertension   . CVA (cerebral infarction) 2010  . Foot drop   . Obesity   . ED (erectile dysfunction)   . Unsteady gait   . Spinal stenosis   . Colon polyps   . H/O Bell's palsy   . OSA on CPAP   . Dizziness   . Stroke [facility])     Past Surgical History  Procedure Laterality Date  . Lumbar disc surgery      x6  . Cervical discectomy  2004  . Carpal tunnel release      bilateral    There were no vitals filed for this visit.  Visit Diagnosis:  Weakness of right lower extremity  Abnormality of gait      Subjective Assessment - 01/27/15 1337    Subjective "I need this session to be short because I am going out of town."   Pertinent History underlying medical history of hypertension, smoking, hyperlipidemia, type 2 diabetes, degenerative back disease, s/p 7 back surgeris, and stroke (incidental finding on an MRI per patient), who presents for a new problem and is referred by his primary care physician for gait instability, and foot drop as well as a history of spinal stenosis and recent/incidental stroke.    Patient Stated Goals Pt's  goals for therapy are to be able to walk better.   Currently in Pain? No/denies                         [facility] Adult PT Treatment/Exercise - 01/27/15 1105    Transfers   Transfers Sit to Stand;Stand to Sit   Sit to Stand 6: Modified independent (Device/Increase time)   Stand to Sit 6: Modified independent (Device/Increase time);With upper extremity assist;To chair/3-in-1;With armrests   Ambulation/Gait   Ambulation/Gait Yes   Ambulation/Gait Assistance 6: Modified independent (Device/Increase time)   Ambulation Distance (Feet) 150 Feet   Assistive device None   Gait Pattern Step-through pattern;Decreased step length - right;Decreased dorsiflexion - right;Trendelenburg;Decreased arm swing - right;Decreased arm swing - left   Ambulation Surface Level;Indoor   Exercises   Exercises Other Exercises   Other Exercises  Went over pts current HEP as follows; tandem stance alternating feet with 10 second hold x 5 reps with cues for longer hold in position, SLS alternating LE with 10 second hold with cues for slower hold and visual target to assist in maintaining balance, clam shell for hip abd x 10 reps each side, BLE bridging x 10 reps with cues for longer hold and increased  abdominal activation, and lower trunk rotation x 10 reps each direction with cues for slower controlled movement.                 PT Education - 01/27/15 1344    Education provided Yes   Education Details went over current HEP and performed with min cues for technique.    Person(s) Educated Patient   Methods Explanation   Comprehension Verbalized understanding;Returned demonstration             PT Long Term Goals - 01/13/15 1120    PT LONG TERM GOAL #1   Title Pt will be independent with HEP for improved strength, balance and gait.   Time 4   Period Weeks   Status New   PT LONG TERM GOAL #2   Title Pt will improve TUG score to less than or equal to 13.5 seconds for decreased fall risk.    Time 4   Period Weeks   Status New   PT LONG TERM GOAL #3   Title Pt will improve Dynamic Gait Index to at least 17/24 for decreased fall risk.   Period Weeks   Status New   PT LONG TERM GOAL #4   Title Pt will ambulate at least 500 ft, indoor/outdoor surfaces with cane, modified independently, for improved community gait.   Time 4   Period Weeks   Status New   PT LONG TERM GOAL #5   Title Pt will verbalize understanding of fall prevention within the home environment.   Time 4   Period Weeks   Status New               Plan - 01/27/15 1513    Clinical Impression Statement Session was brief due to pt needing to leave to go to beach with family.  Assessed performance of HEP with min cues for technique throughout.  Encouraged pt to perform these at the beach.  Pt states he was unable to order brace from vendor.  PT contacted Gerald Stabs from [employer] and states that brace is cheaper to order through [employer].  Looked up this info and called pt, LVM on pts phone regarding ordering on [place].   Primary PT notified.    Pt will benefit from skilled therapeutic intervention in order to improve on the following deficits Abnormal gait;Decreased balance;Decreased mobility;Decreased strength;Difficulty walking;Impaired sensation   Rehab Potential Good   PT Frequency 2x / week   PT Duration 4 weeks   PT Treatment/Interventions ADLs/Self Care Home Management;Gait training;Therapeutic activities;Therapeutic exercise;Orthotic Fit/Training;Balance training;Neuromuscular re-education;Patient/family education   PT Next Visit Plan Follow up with foot up brace, progress balance/strength if needed, D/C? if pt able to get brace   Consulted and Agree with Plan of Care Patient        Problem List Patient Active Problem List   Diagnosis Date Noted  . DYSPNEA ON EXERTION 10/06/2009  . Nonspecific (abnormal) findings on radiological and other examination of body structure 10/06/2009  . CT, CHEST, ABNORMAL  10/06/2009  . COLONIC POLYPS 10/05/2009  . DIABETES, TYPE 2 10/05/2009  . HYPERLIPIDEMIA 10/05/2009  . 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 HYPERTENSION 10/05/2009  . CVA 10/05/2009  . ERECTILE DYSFUNCTION, 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/2009    Cameron Sprang, PT, MPT [facility] [address] [place], [place], [address] Phone: [phone]   Fax:  [phone] 01/27/2015, 3:45 PM

## 2015-01-29 ENCOUNTER — Encounter: Payer: Medicare Other | Admitting: Occupational Therapy

## 2015-01-29 ENCOUNTER — Ambulatory Visit: Payer: Medicare Other | Admitting: Rehabilitation

## 2015-02-03 ENCOUNTER — Ambulatory Visit: Payer: Medicare Other | Admitting: Physical Therapy

## 2015-02-03 DIAGNOSIS — R29898 Other symptoms and signs involving the musculoskeletal system: Secondary | ICD-10-CM | POA: Diagnosis not present

## 2015-02-03 DIAGNOSIS — Z7409 Other reduced mobility: Secondary | ICD-10-CM | POA: Diagnosis not present

## 2015-02-03 DIAGNOSIS — R269 Unspecified abnormalities of gait and mobility: Secondary | ICD-10-CM | POA: Diagnosis not present

## 2015-02-03 DIAGNOSIS — M6289 Other specified disorders of muscle: Secondary | ICD-10-CM | POA: Diagnosis not present

## 2015-02-03 DIAGNOSIS — R279 Unspecified lack of coordination: Secondary | ICD-10-CM | POA: Diagnosis not present

## 2015-02-03 NOTE — Patient Instructions (Signed)
1)Please look up foot-up brace on [employer] to check the price  2)  Please call [facility] [employer] at [phone].  Raquel Sarna had previously spoken with Gerald Stabs)  -You will want to ask if you have a doctor's order for the foot-up brace, can they order and bill your insurance  -I will follow-up with Dr. Brigitte Pulse and ask him for the order for the foot-up brace in case this is the route you choose

## 2015-02-04 NOTE — Therapy (Signed)
[facility] [address] [place], [place], [address] Phone: [phone]   Fax:  [phone]  Physical Therapy Treatment  Patient Details  Name: Rodney Crane MRN: [id number] Date of Birth: 03-03-1951 Referring Provider:  Marton Redwood, MD  Encounter Date: 02/03/2015      PT End of Session - 02/04/15 1009    Visit Number 5   Number of Visits 9   Date for PT Re-Evaluation 03/14/15   Authorization Type Medicare G code every 10th visit   PT Start Time 0847   PT Stop Time 0930   PT Time Calculation (min) 43 min   Equipment Utilized During Treatment Gait belt   Activity Tolerance Patient tolerated treatment well  Needs several seated rest breaks   Behavior During Therapy [facility] for tasks assessed/performed      Past Medical History  Diagnosis Date  . Arthritis   . Diabetes mellitus   . Hyperlipidemia   . Hypertension   . CVA (cerebral infarction) 2010  . Foot drop   . Obesity   . ED (erectile dysfunction)   . Unsteady gait   . Spinal stenosis   . Colon polyps   . H/O Bell's palsy   . OSA on CPAP   . Dizziness   . Stroke [facility])     Past Surgical History  Procedure Laterality Date  . Lumbar disc surgery      x6  . Cervical discectomy  2004  . Carpal tunnel release      bilateral    There were no vitals filed for this visit.  Visit Diagnosis:  Weakness of right lower extremity  Abnormality of gait      Subjective Assessment - 02/03/15 0850    Subjective Raquel Sarna gave me the information on the brace (ordering on [place] versus through orthotist).  No falls/stumbles since last visit   Currently in Pain? No/denies                         [facility] Adult PT Treatment/Exercise - 02/03/15 0855    Ambulation/Gait   Ambulation/Gait Yes   Ambulation/Gait Assistance 6: Modified independent (Device/Increase time)   Ambulation/Gait Assistance Details Used foot-up brace for gait activities    Ambulation Distance (Feet) 430 Feet  then 440-see below   Assistive device None   Gait Pattern Step-through pattern;Decreased step length - right;Decreased dorsiflexion - right;Trendelenburg;Decreased arm swing - right;Decreased arm swing - left   Ambulation Surface Level;Indoor   Gait Comments Pt ambulates 430 ft. in 3 minute walk test wearing R foot up brace; Pt ambulates 440 ft without foot-up brace.  Noted increased trunk and hip sway as well as decreased RLE foot clearance with gait without foot-up brace.   High Level Balance   High Level Balance Activities Side stepping;Tandem walking;Marching forwards   High Level Balance Comments Tandem marching, resisted side-stepping squats 10 ft to R and then to L; in parallel bars on compliant beam:  marching in place x 10 reps, then forward kicks x 10 reps, then forward step taps x 10 reps (pt requests seated rest break due to numbness in feet)   Knee/Hip Exercises: Standing   Forward Step Up Right;Left;1 set;10 reps;Hand Hold: 2;Step Height: 6"   Forward Step Up Limitations increased UE support with L forward step ups     Exercises performed for lower extremity strengthening with supervision     Self Care:  Reiterated benefits of foot-up brace and pt  needing to follow up with [employer] or orthotist for ordering.      PT Education - 02/04/15 1008    Education provided Yes   Education Details Pt follow-up needed to decide how he wants to proceed with foot-up brace ordering   Person(s) Educated Patient   Methods Explanation;Handout   Comprehension Verbalized understanding             PT Long Term Goals - 01/13/15 1120    PT LONG TERM GOAL #1   Title Pt will be independent with HEP for improved strength, balance and gait.   Time 4   Period Weeks   Status New   PT LONG TERM GOAL #2   Title Pt will improve TUG score to less than or equal to 13.5 seconds for decreased fall risk.   Time 4   Period Weeks   Status New   PT LONG TERM  GOAL #3   Title Pt will improve Dynamic Gait Index to at least 17/24 for decreased fall risk.   Period Weeks   Status New   PT LONG TERM GOAL #4   Title Pt will ambulate at least 500 ft, indoor/outdoor surfaces with cane, modified independently, for improved community gait.   Time 4   Period Weeks   Status New   PT LONG TERM GOAL #5   Title Pt will verbalize understanding of fall prevention within the home environment.   Time 4   Period Weeks   Status New               Plan - 02/04/15 1011    Clinical Impression Statement Pt demonstrates improved gait mechanics when using foot-up brace.  Pt has not yet followed up on foot up brace ordering on his own.  PT will request order from Dr. Brigitte Pulse, but unsure if orthotist will submit for insurance.  Pt is likely on track to meet LTGs.  May begin checking goals next visit.   Pt will benefit from skilled therapeutic intervention in order to improve on the following deficits Abnormal gait;Decreased balance;Decreased mobility;Decreased strength;Difficulty walking;Impaired sensation   Rehab Potential Good   PT Frequency 2x / week   PT Duration 4 weeks   PT Treatment/Interventions ADLs/Self Care Home Management;Gait training;Therapeutic activities;Therapeutic exercise;Orthotic Fit/Training;Balance training;Neuromuscular re-education;Patient/family education   PT Next Visit Plan Begin checking long term goals and plan for discharge.   Recommended Other Services PT to request foot-up brace order from Dr. Freada Bergeron and Agree with Plan of Care Patient        Problem List Patient Active Problem List   Diagnosis Date Noted  . DYSPNEA ON EXERTION 10/06/2009  . Nonspecific (abnormal) findings on radiological and other examination of body structure 10/06/2009  . CT, CHEST, ABNORMAL 10/06/2009  . COLONIC POLYPS 10/05/2009  . DIABETES, TYPE 2 10/05/2009  . HYPERLIPIDEMIA 10/05/2009  . 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 HYPERTENSION 10/05/2009   . CVA 10/05/2009  . ERECTILE DYSFUNCTION, 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/2009    Chesley Valls W. 02/04/2015, 10:14 AM  Frazier Butt., PT  [facility] [address] [place], [place], [address] Phone: [phone]   Fax:  [phone]

## 2015-02-05 ENCOUNTER — Ambulatory Visit: Payer: Medicare Other

## 2015-02-05 DIAGNOSIS — R269 Unspecified abnormalities of gait and mobility: Secondary | ICD-10-CM

## 2015-02-05 DIAGNOSIS — M6289 Other specified disorders of muscle: Secondary | ICD-10-CM | POA: Diagnosis not present

## 2015-02-05 DIAGNOSIS — R29898 Other symptoms and signs involving the musculoskeletal system: Secondary | ICD-10-CM | POA: Diagnosis not present

## 2015-02-05 DIAGNOSIS — Z7409 Other reduced mobility: Secondary | ICD-10-CM | POA: Diagnosis not present

## 2015-02-05 DIAGNOSIS — R279 Unspecified lack of coordination: Secondary | ICD-10-CM | POA: Diagnosis not present

## 2015-02-05 NOTE — Therapy (Signed)
[facility] [address] [place], [place], [address] Phone: [phone]   Fax:  [phone]  Physical Therapy Treatment  Patient Details  Name: Rodney Crane MRN: [id number] Date of Birth: Nov 02, 1950 Referring Provider:  Marton Redwood, MD  Encounter Date: 02/05/2015      PT End of Session - 02/05/15 0854    Visit Number 6   Number of Visits 9   Date for PT Re-Evaluation 03/14/15   Authorization Type Medicare G code every 10th visit   PT Start Time 0805   PT Stop Time 0844   PT Time Calculation (min) 39 min   Equipment Utilized During Treatment Gait belt   Activity Tolerance Patient tolerated treatment well   Behavior During Therapy [facility] for tasks assessed/performed      Past Medical History  Diagnosis Date  . Arthritis   . Diabetes mellitus   . Hyperlipidemia   . Hypertension   . CVA (cerebral infarction) 2010  . Foot drop   . Obesity   . ED (erectile dysfunction)   . Unsteady gait   . Spinal stenosis   . Colon polyps   . H/O Bell's palsy   . OSA on CPAP   . Dizziness   . Stroke [facility])     Past Surgical History  Procedure Laterality Date  . Lumbar disc surgery      x6  . Cervical discectomy  2004  . Carpal tunnel release      bilateral    There were no vitals filed for this visit.  Visit Diagnosis:  Abnormality of gait  Decreased functional mobility and endurance      Subjective Assessment - 02/05/15 0807    Subjective Pt denied changes since last visit. Pt reported he ordered foot up brace from [facility] and should have it in a few days.   Pertinent History underlying medical history of hypertension, smoking, hyperlipidemia, type 2 diabetes, degenerative back disease, s/p 7 back surgeris, and stroke (incidental finding on an MRI per patient), who presents for a new problem and is referred by his primary care physician for gait instability, and foot drop as well as a history of spinal stenosis  and recent/incidental stroke.    Patient Stated Goals Pt's goals for therapy are to be able to walk better.   Currently in Pain? No/denies  but pt reported "normal" stiffness that he always feels in the morning                         [facility] Adult PT Treatment/Exercise - 02/05/15 0809    Ambulation/Gait   Ambulation/Gait Yes   Ambulation/Gait Assistance 6: Modified independent (Device/Increase time)   Ambulation/Gait Assistance Details Pt amb. indoors/outdoors with SPC while performing head turns. No overt LOB episodes.    Ambulation Distance (Feet) 500 Feet  with SPC 75'x2 no AD   Assistive device Straight cane   Gait Pattern Step-through pattern;Decreased step length - right;Decreased dorsiflexion - right;Trendelenburg;Decreased arm swing - left   Ambulation Surface Level;Unlevel;Indoor;Outdoor;Paved   Standardized Balance Assessment   Standardized Balance Assessment Timed Up and Go Test;Dynamic Gait Index   Dynamic Gait Index   Level Surface Mild Impairment   Change in Gait Speed Mild Impairment   Gait with Horizontal Head Turns Normal   Gait with Vertical Head Turns Normal   Gait and Pivot Turn Mild Impairment   Step Over Obstacle Normal   Step Around Obstacles Normal   Steps Mild  Impairment   Total Score 20   Timed Up and Go Test   TUG Normal TUG   Normal TUG (seconds) 12.6  no AD           Self Care:     PT Education - 02/05/15 0853    Education provided Yes   Education Details Fall prevention handout. PT discussed d/c next session due to pt meeting goals today, PT will assess LTGs 1 and 5 next session. PT discussed the importance of performing core strengthening exercises to improve core strength in order to reduce back pain.   Person(s) Educated Patient   Methods Explanation;Handout   Comprehension Verbalized understanding             PT Long Term Goals - 02/05/15 0857    PT LONG TERM GOAL #1   Title Pt will be independent with HEP  for improved strength, balance and gait.   Time 4   Period Weeks   Status On-going   PT LONG TERM GOAL #2   Title Pt will improve TUG score to less than or equal to 13.5 seconds for decreased fall risk.   Baseline 12.6 sec. no AD on 02/05/15.   Time 4   Period Weeks   Status Achieved   PT LONG TERM GOAL #3   Title Pt will improve Dynamic Gait Index to at least 17/24 for decreased fall risk.   Baseline 20/24 on 02/05/15.   Period Weeks   Status Achieved   PT LONG TERM GOAL #4   Title Pt will ambulate at least 500 ft, indoor/outdoor surfaces with cane, modified independently, for improved community gait.   Baseline 500' with SPC at MOD I on 02/05/15.   Time 4   Period Weeks   Status Achieved   PT LONG TERM GOAL #5   Title Pt will verbalize understanding of fall prevention within the home environment.   Time 4   Period Weeks   Status On-going               Plan - 02/05/15 0855    Clinical Impression Statement Pt demonstrated progress as he met LTGs 2, 3, and 4 today. PT will assess LTG 1 and 5 next session. Pt's DGI score and TUG time indicate pt is at a decreased risk for falls. Pt continues to amb. with trendelenberg gait, 2/2 weak glute med. Continue with POC.    Pt will benefit from skilled therapeutic intervention in order to improve on the following deficits Abnormal gait;Decreased balance;Decreased mobility;Decreased strength;Difficulty walking;Impaired sensation   Rehab Potential Good   PT Frequency 2x / week   PT Duration 4 weeks   PT Treatment/Interventions ADLs/Self Care Home Management;Gait training;Therapeutic activities;Therapeutic exercise;Orthotic Fit/Training;Balance training;Neuromuscular re-education;Patient/family education   PT Next Visit Plan G-CODE. Assess LTG 1 and 5, add core strengthening as appropriate to HEP and d/c.   Consulted and Agree with Plan of Care Patient        Problem List Patient Active Problem List   Diagnosis Date Noted  .  DYSPNEA ON EXERTION 10/06/2009  . Nonspecific (abnormal) findings on radiological and other examination of body structure 10/06/2009  . CT, CHEST, ABNORMAL 10/06/2009  . COLONIC POLYPS 10/05/2009  . DIABETES, TYPE 2 10/05/2009  . HYPERLIPIDEMIA 10/05/2009  . 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 HYPERTENSION 10/05/2009  . CVA 10/05/2009  . ERECTILE DYSFUNCTION, 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/2009    Iridian Reader L 02/05/2015, 8:58 AM  [facility] [address] [place]  [place], [place], [address] Phone: [phone]   Fax:  [phone]     Geoffry Paradise, PT,DPT 02/05/2015 8:59 AM Phone: [phone] Fax: [phone]

## 2015-02-05 NOTE — Patient Instructions (Signed)
Fall Prevention in the Home  Falls can cause injuries and can affect people from all age groups. There are many simple things that you can do to make your home safe and to help prevent falls. WHAT CAN I DO ON THE OUTSIDE OF MY HOME?  Regularly repair the edges of walkways and driveways and fix any cracks.  Remove high doorway thresholds.  Trim any shrubbery on the main path into your home.  Use bright outdoor lighting.  Clear walkways of debris and clutter, including tools and rocks.  Regularly check that handrails are securely fastened and in good repair. Both sides of any steps should have handrails.  Install guardrails along the edges of any raised decks or porches.  Have leaves, snow, and ice cleared regularly.  Use sand or salt on walkways during winter months.  In the garage, clean up any spills right away, including grease or oil spills. WHAT CAN I DO IN THE BATHROOM?  Use night lights.  Install grab bars by the toilet and in the tub and shower. Do not use towel bars as grab bars.  Use non-skid mats or decals on the floor of the tub or shower.  If you need to sit down while you are in the shower, use a plastic, non-slip stool..  Keep the floor dry. Immediately clean up any water that spills on the floor.  Remove soap buildup in the tub or shower on a regular basis.  Attach bath mats securely with double-sided non-slip rug tape.  Remove throw rugs and other tripping hazards from the floor. WHAT CAN I DO IN THE BEDROOM?  Use night lights.  Make sure that a bedside light is easy to reach.  Do not use oversized bedding that drapes onto the floor.  Have a firm chair that has side arms to use for getting dressed.  Remove throw rugs and other tripping hazards from the floor. WHAT CAN I DO IN THE KITCHEN?   Clean up any spills right away.  Avoid walking on wet floors.  Place frequently used items in easy-to-reach places.  If you need to reach for something  above you, use a sturdy step stool that has a grab bar.  Keep electrical cables out of the way.  Do not use floor polish or wax that makes floors slippery. If you have to use wax, make sure that it is non-skid floor wax.  Remove throw rugs and other tripping hazards from the floor. WHAT CAN I DO IN THE STAIRWAYS?  Do not leave any items on the stairs.  Make sure that there are handrails on both sides of the stairs. Fix handrails that are broken or loose. Make sure that handrails are as long as the stairways.  Check any carpeting to make sure that it is firmly attached to the stairs. Fix any carpet that is loose or worn.  Avoid having throw rugs at the top or bottom of stairways, or secure the rugs with carpet tape to prevent them from moving.  Make sure that you have a light switch at the top of the stairs and the bottom of the stairs. If you do not have them, have them installed. WHAT ARE SOME OTHER FALL PREVENTION TIPS?  Wear closed-toe shoes that fit well and support your feet. Wear shoes that have rubber soles or low heels.  When you use a stepladder, make sure that it is completely opened and that the sides are firmly locked. Have someone hold the ladder while you   are using it. Do not climb a closed stepladder.  Add color or contrast paint or tape to grab bars and handrails in your home. Place contrasting color strips on the first and last steps.  Use mobility aids as needed, such as canes, walkers, scooters, and crutches.  Turn on lights if it is dark. Replace any light bulbs that burn out.  Set up furniture so that there are clear paths. Keep the furniture in the same spot.  Fix any uneven floor surfaces.  Choose a carpet design that does not hide the edge of steps of a stairway.  Be aware of any and all pets.  Review your medicines with your healthcare provider. Some medicines can cause dizziness or changes in blood pressure, which increase your risk of falling. Talk  with your health care provider about other ways that you can decrease your risk of falls. This may include working with a physical therapist or trainer to improve your strength, balance, and endurance.   This information is not intended to replace advice given to you by your health care provider. Make sure you discuss any questions you have with your health care provider.   Document Released: 04/01/2002 Document Revised: 08/26/2014 Document Reviewed: 05/16/2014 Elsevier Interactive Patient Education 2016 [employer]  

## 2015-02-10 ENCOUNTER — Ambulatory Visit: Payer: Medicare Other | Admitting: Occupational Therapy

## 2015-02-10 ENCOUNTER — Ambulatory Visit: Payer: Medicare Other | Admitting: Physical Therapy

## 2015-02-10 DIAGNOSIS — R269 Unspecified abnormalities of gait and mobility: Secondary | ICD-10-CM | POA: Diagnosis not present

## 2015-02-10 DIAGNOSIS — Z7409 Other reduced mobility: Secondary | ICD-10-CM | POA: Diagnosis not present

## 2015-02-10 DIAGNOSIS — R29898 Other symptoms and signs involving the musculoskeletal system: Secondary | ICD-10-CM | POA: Diagnosis not present

## 2015-02-10 DIAGNOSIS — R279 Unspecified lack of coordination: Secondary | ICD-10-CM

## 2015-02-10 DIAGNOSIS — R278 Other lack of coordination: Secondary | ICD-10-CM

## 2015-02-10 DIAGNOSIS — M6289 Other specified disorders of muscle: Secondary | ICD-10-CM | POA: Diagnosis not present

## 2015-02-10 NOTE — Therapy (Signed)
[facility] [address] [place], [place], [address] Phone: [phone]   Fax:  [phone]  Physical Therapy Treatment  Patient Details  Name: Rodney Crane MRN: [id number] Date of Birth: 14-May-1950 No Data Recorded  Encounter Date: 02/10/2015      PT End of Session - 02/10/15 1100    Visit Number 7   Number of Visits 9   Date for PT Re-Evaluation 03/14/15   Authorization Type Medicare G code every 10th visit   PT Start Time 0851   PT Stop Time 0914   PT Time Calculation (min) 23 min   Activity Tolerance Patient tolerated treatment well   Behavior During Therapy [facility] for tasks assessed/performed      Past Medical History  Diagnosis Date  . Arthritis   . Diabetes mellitus   . Hyperlipidemia   . Hypertension   . CVA (cerebral infarction) 2010  . Foot drop   . Obesity   . ED (erectile dysfunction)   . Unsteady gait   . Spinal stenosis   . Colon polyps   . H/O Bell's palsy   . OSA on CPAP   . Dizziness   . Stroke [facility])     Past Surgical History  Procedure Laterality Date  . Lumbar disc surgery      x6  . Cervical discectomy  2004  . Carpal tunnel release      bilateral    There were no vitals filed for this visit.  Visit Diagnosis:  Abnormality of gait      Subjective Assessment - 02/10/15 0852    Subjective Got the foot up brace-not sure that I have it adjusted correctly   Currently in Pain? No/denies                         [facility] Adult PT Treatment/Exercise - 02/10/15 0859    Ambulation/Gait   Ambulation/Gait Yes  adjusted foot-up brace   Ambulation/Gait Assistance 6: Modified independent (Device/Increase time)   Ambulation/Gait Assistance Details Pt ambulates with increased foot clearance with foot-up brace.   Ambulation Distance (Feet) 400 Feet   Assistive device None       Reviewed tandem stance and single limb stance at counter-pt is independent; reiterated  importance of minimal UE support if needed.  Tandem walking, marching forward, then tandem marching 2 x 10 ft with intermittent UE support; resisted sidestepping with green theraband (provided for home use), then resisted sidestep squats 4 x 10 ft with UE support.  Discussed discharge and pt feels comfortable with d/c today.                PT Long Term Goals - 02/10/15 0854    PT LONG TERM GOAL #1   Title Pt will be independent with HEP for improved strength, balance and gait.   Time 4   Period Weeks   Status Achieved   PT LONG TERM GOAL #2   Title Pt will improve TUG score to less than or equal to 13.5 seconds for decreased fall risk.   Baseline 12.6 sec. no AD on 02/05/15.   Time 4   Period Weeks   Status Achieved   PT LONG TERM GOAL #3   Title Pt will improve Dynamic Gait Index to at least 17/24 for decreased fall risk.   Baseline 20/24 on 02/05/15.   Period Weeks   Status Achieved   PT LONG TERM GOAL #4   Title Pt  will ambulate at least 500 ft, indoor/outdoor surfaces with cane, modified independently, for improved community gait.   Baseline 500' with SPC at MOD I on 02/05/15.   Time 4   Period Weeks   Status Achieved   PT LONG TERM GOAL #5   Title Pt will verbalize understanding of fall prevention within the home environment.   Time 4   Period Weeks   Status Achieved               Plan - 02-25-15 1101    Clinical Impression Statement Pt has met LTG #1 and #5.  Pt has received his foot-up brace and is wearing.  He needed minor adjustments to secure the foot-up brace in his shoe.  He appears appropriate for discharge this visit.   Pt will benefit from skilled therapeutic intervention in order to improve on the following deficits Abnormal gait;Decreased balance;Decreased mobility;Decreased strength;Difficulty walking;Impaired sensation   Rehab Potential Good   PT Frequency 2x / week   PT Duration 4 weeks   PT Treatment/Interventions ADLs/Self Care Home  Management;Gait training;Therapeutic activities;Therapeutic exercise;Orthotic Fit/Training;Balance training;Neuromuscular re-education;Patient/family education   PT Next Visit Plan Discharge this visit   Consulted and Agree with Plan of Care Patient          G-Codes - 02/25/2015 1418    Functional Assessment Tool Used TUG 12.6 sec, 20/24 DGI   Functional Limitation Mobility: Walking and moving around   Mobility: Walking and Moving Around Goal Status [phone]) At least 1 percent but less than 20 percent impaired, limited or restricted   Mobility: Walking and Moving Around Discharge Status [phone]) At least 1 percent but less than 20 percent impaired, limited or restricted      Problem List Patient Active Problem List   Diagnosis Date Noted  . DYSPNEA ON EXERTION 10/06/2009  . Nonspecific (abnormal) findings on radiological and other examination of body structure 10/06/2009  . CT, CHEST, ABNORMAL 10/06/2009  . COLONIC POLYPS 10/05/2009  . DIABETES, TYPE 2 10/05/2009  . HYPERLIPIDEMIA 10/05/2009  . 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 HYPERTENSION 10/05/2009  . CVA 10/05/2009  . ERECTILE DYSFUNCTION, 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/2009    Rodney Mcmichen W. February 25, 2015, 2:20 PM  Frazier Butt., PT  [facility] [address] [place], [place], [address] Phone: [phone]   Fax:  [phone]  Name: Rodney Crane MRN: [id number] Date of Birth: 04/10/1951    PHYSICAL THERAPY DISCHARGE SUMMARY  Visits from Start of Care: 7  Current functional level related to goals / functional outcomes:     PT Long Term Goals - 25-Feb-2015 0854    PT LONG TERM GOAL #1   Title Pt will be independent with HEP for improved strength, balance and gait.   Time 4   Period Weeks   Status Achieved   PT LONG TERM GOAL #2   Title Pt will improve TUG score to less than or equal to 13.5 seconds for decreased fall risk.   Baseline 12.6 sec. no AD on 02/05/15.   Time 4    Period Weeks   Status Achieved   PT LONG TERM GOAL #3   Title Pt will improve Dynamic Gait Index to at least 17/24 for decreased fall risk.   Baseline 20/24 on 02/05/15.   Period Weeks   Status Achieved   PT LONG TERM GOAL #4   Title Pt will ambulate at least 500 ft, indoor/outdoor surfaces with cane, modified independently, for improved community gait.   Baseline 500' with  SPC at MOD I on 02/05/15.   Time 4   Period Weeks   Status Achieved   PT LONG TERM GOAL #5   Title Pt will verbalize understanding of fall prevention within the home environment.   Time 4   Period Weeks   Status Achieved    Pt has achieved all long term goals.   Remaining deficits: Lower extremity weakness (history of lumbar surgeries with RLE weakness associated)   Education / Equipment: Pt has been educated in HEP, fall prevention, use of foot-up brace for gait assistance.  Plan: Patient agrees to discharge.  Patient goals were met. Patient is being discharged due to meeting the stated rehab goals.  ?????    Mady Haagensen, PT 02/11/2015 7:46 AM Phone: [phone] Fax: [phone]

## 2015-02-10 NOTE — Therapy (Signed)
[employer] [address] [place], [place], [address] Phone: [phone]   Fax:  [phone]  Occupational Therapy Treatment  Patient Details  Name: Rodney Crane MRN: [id number] Date of Birth: [birth year] No Data Recorded  Encounter Date: 02/10/2015      OT End of Session - 02/10/15 0943    Visit Number 4   Number of Visits 8   Date for OT Re-Evaluation 02/11/15   Authorization Type MCR primary (BC/BS secondary) - G CODE   OT Start Time 0935   OT Stop Time 1013   OT Time Calculation (min) 38 min   Activity Tolerance Patient tolerated treatment well   Behavior During Therapy [facility] for tasks assessed/performed      Past Medical History  Diagnosis Date  . Arthritis   . Diabetes mellitus   . Hyperlipidemia   . Hypertension   . CVA (cerebral infarction) 2010  . Foot drop   . Obesity   . ED (erectile dysfunction)   . Unsteady gait   . Spinal stenosis   . Colon polyps   . H/O Bell's palsy   . OSA on CPAP   . Dizziness   . Stroke [facility])     Past Surgical History  Procedure Laterality Date  . Lumbar disc surgery      x6  . Cervical discectomy  2004  . Carpal tunnel release      bilateral    There were no vitals filed for this visit.  Visit Diagnosis:  Weakness of right hand  Weakness of right arm  Decreased coordination      Subjective Assessment - 02/10/15 0946    Subjective  Pt reports that he feels things are going well and that he is ready for d/c   Pertinent History HTN, DM, DJD, Spinal stenosis, approx. 7 back surgeries (1 cervical, 1 thoracic, 5 lumbar)   Limitations fall risk   Patient Stated Goals to get better with writing   Currently in Pain? No/denies                      OT Treatments/Exercises (OP) - 02/10/15 0001    ADLs   Overall ADLs Pt reports that he has returned to the gym.  Checked LTGs and discussed progress.    Fine Motor Coordination   Fine Motor  Coordination O'Connor pegs;Grooved pegs   O'Connor pegs Placing pegs in pegboard with tweezers with min difficulty for incr coordination   Grooved pegs Placing grooved pegs in pegboard with only occasional min difficulty with in-hand manipulation   Functional Reaching Activities   High Level Functional reaching to place clothespins with 1-8lbs resistance on vertical pole for incr activity tolerance/strength.                OT Education - 02/10/15 1015    Education Details Reviewed CVA symptoms and risk factors   Person(s) Educated Patient   Methods Explanation   Comprehension Verbalized understanding             OT Long Term Goals - 02/10/15 0939    OT LONG TERM GOAL #1   Title Independent with HEP (DUE 02/11/15)   Time 4   Period Weeks   Status Achieved   OT LONG TERM GOAL #2   Title Grip strength Rt hand to increase by 5 lbs or greater    Baseline 87 lbs (Lt = 98 lbs)   Time 4   Period Weeks  Status Achieved  R- 95lbs 02-16-2015   OT LONG TERM GOAL #3   Title Pt to improve coordination as evidenced by reducing speed on 9 hole peg test to 25 sec. or less   Baseline 27.59 sec   Time 4   Period Weeks   Status Not Met  31.09sec 02-16-15               Plan - 02/16/15 0943    Clinical Impression Statement Pt met 2/3 LTGs and denies functional difficulty with coordination.  Pt is appropriate for d/c and verbalizes desire to d/c today.   Plan d/c OT   OT Home Exercise Plan 01/20/15: HEP   Consulted and Agree with Plan of Care Patient      OCCUPATIONAL THERAPY DISCHARGE SUMMARY  Visits from Start of Care: see above  Current functional level related to goals / functional outcomes: See above   Remaining deficits: Mild decreased RUE strength and coordination   Education / Equipment: Pt instructed in HEP and CVA education.  Pt verbalized understanding of all education provided.  Plan: Patient agrees to discharge.  Patient goals were partially met.  Patient is being discharged due to being pleased with the current functional level.  ?????          G-Codes - 16-Feb-2015 1017    Functional Assessment Tool Used RUE: 9 hole peg test = 31.09 sec., Grip = 95 lbs (Lt = 98 lbs), pt denies functional difficulties and verbalized understanding of HEP for strength/coordination   Functional Limitation Carrying, moving and handling objects   Carrying, Moving and Handling Objects Goal Status ([id number]) 0 percent impaired, limited or restricted   Carrying, Moving and Handling Objects Discharge Status [phone]) 0 percent impaired, limited or restricted      Problem List Patient Active Problem List   Diagnosis Date Noted  . DYSPNEA ON EXERTION 10/06/2009  . Nonspecific (abnormal) findings on radiological and other examination of body structure 10/06/2009  . CT, CHEST, ABNORMAL 10/06/2009  . COLONIC POLYPS 10/05/2009  . DIABETES, TYPE 2 10/05/2009  . HYPERLIPIDEMIA 10/05/2009  . 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 HYPERTENSION 10/05/2009  . CVA 10/05/2009  . ERECTILE DYSFUNCTION, 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/2009    [facility] 2015/02/16, 10:19 AM  [facility] [address] [place], [place], [address] Phone: [phone]   Fax:  [phone]  Name: Rodney Crane MRN: [id number] Date of Birth: Oct 18, 1950  Vianne Bulls, OTR/L 2015-02-16 10:19 AM

## 2015-02-12 ENCOUNTER — Ambulatory Visit: Payer: Medicare Other | Admitting: Physical Therapy

## 2015-02-16 ENCOUNTER — Ambulatory Visit: Payer: Medicare Other | Admitting: Occupational Therapy

## 2015-02-18 ENCOUNTER — Encounter: Payer: Medicare Other | Admitting: Occupational Therapy

## 2015-03-31 DIAGNOSIS — G8929 Other chronic pain: Secondary | ICD-10-CM | POA: Diagnosis not present

## 2015-03-31 DIAGNOSIS — M4807 Spinal stenosis, lumbosacral region: Secondary | ICD-10-CM | POA: Diagnosis not present

## 2015-03-31 DIAGNOSIS — M961 Postlaminectomy syndrome, not elsewhere classified: Secondary | ICD-10-CM | POA: Diagnosis not present

## 2015-03-31 DIAGNOSIS — M5442 Lumbago with sciatica, left side: Secondary | ICD-10-CM | POA: Diagnosis not present

## 2015-04-22 ENCOUNTER — Ambulatory Visit: Payer: Medicare Other | Admitting: Neurology

## 2015-04-23 ENCOUNTER — Encounter: Payer: Self-pay | Admitting: Neurology

## 2015-04-23 ENCOUNTER — Ambulatory Visit (INDEPENDENT_AMBULATORY_CARE_PROVIDER_SITE_OTHER): Payer: Medicare Other | Admitting: Neurology

## 2015-04-23 VITALS — BP 149/81 | HR 70 | Resp 18 | Ht 73.0 in | Wt 274.0 lb

## 2015-04-23 DIAGNOSIS — G4733 Obstructive sleep apnea (adult) (pediatric): Secondary | ICD-10-CM | POA: Diagnosis not present

## 2015-04-23 DIAGNOSIS — Z8673 Personal history of transient ischemic attack (TIA), and cerebral infarction without residual deficits: Secondary | ICD-10-CM

## 2015-04-23 DIAGNOSIS — I6930 Unspecified sequelae of cerebral infarction: Secondary | ICD-10-CM

## 2015-04-23 DIAGNOSIS — Z9989 Dependence on other enabling machines and devices: Principal | ICD-10-CM

## 2015-04-23 NOTE — Patient Instructions (Addendum)
Please continue using your CPAP regularly. While your insurance requires that you use CPAP at least 4 hours each night on 70% of the nights, I recommend, that you not skip any nights and use it throughout the night if you can. Getting used to CPAP and staying with the treatment long term does take time and patience and discipline. Untreated obstructive sleep apnea when it is moderate to severe can have an adverse impact on cardiovascular health and raise her risk for heart disease, arrhythmias, hypertension, congestive heart failure, stroke and diabetes. Untreated obstructive sleep apnea causes sleep disruption, nonrestorative sleep, and sleep deprivation. This can have an impact on your day to day functioning and cause daytime sleepiness and impairment of cognitive function, memory loss, mood disturbance, and problems focussing. Using CPAP regularly can improve these symptoms.  Keep up the good work! I will see you back in 12 months for sleep apnea check up.   Continue exercising regularly and take your medications as directed. As discussed, secondary prevention is key after a stroke. This means: taking care of blood sugar values or diabetes management, good blood pressure (hypertension) control and optimizing cholesterol management, exercising daily or regularly within your own mobility limitations of course, and overall cardiovascular risk factor reduction, which includes screening for and treatment of obstructive sleep apnea (OSA) and weight management.

## 2015-04-23 NOTE — Progress Notes (Signed)
Subjective:    Patient ID: Rodney Crane is a 64 y.o. male.  HPI     Interim history:   Rodney Crane is a very friendly 64 year old right-handed gentleman with an underlying medical history of hypertension, smoking, hyperlipidemia, type 2 diabetes, degenerative back disease, s/p 7 back surgeris, and stroke (left thalamic on MRI), who presents for follow-up consultation follow-up consultation of his stroke and sleep apnea. The patient is unaccompanied today. I last saw him on 12/22/2014 at which time he reported a recent history of left facial numbness and tingling and subjective feeling of tongue swelling. On examination he had decreased sensation of his left midface and very slight right grip strength weakness. On MRI brain he had evidence of a recent left thalamic stroke. We talked about stroke prevention, he was advised to continue with Plavix and I suggested a repeat brain MRI with contrast as well as a stroke consult with Dr. Erlinda Hong. I also suggested a cardiology referral due to chest pains reported.  Today, 04/23/2015: I reviewed his CPAP compliance data from 03/22/15 through 04/20/15, which is a total of 30 days during which time the patient used CPAP every night, with percent used days greater than 4 hours at 100%, indicating superb compliance with an average usage of 7 hours and 6 minutes, residual AHI low at 1 per hour, leak low with the 95th percentile at 5.5 L/m on a pressure of 7 cm with EPR of 3.  Today, 04/23/2015: He reports doing well. Had MRI brain w contrast at [facility] on 01/01/15. He did not have his appointment with Dr. Erlinda Hong. He went to see Dr. Esperanza Sheets cardiology and a stress test, which was fine, per patient. His brain MRI w contrast did show evidence of an evolving L thalamic infarct. I had suggested a cardiology referral because of chest pains reported.   Previously:  I saw him on 01/15/2014 for sleep apnea follow-up at which time the patient reported doing well with CPAP and he  was using it and was fully compliant with treatment. He reported that he had residual back pain and may need a spinal cord stimulator. He reported a 20 pound weight loss overall. I suggested a one-year follow-up.  He had a brain MRI without contrast on 12/04/2014: Punctate subacute infarction in the left thalamus. Mild leukoarosis, unchanged. This is most likely due to chronic microvascular disease. Increased signal intensity in the pons that may represent a demyelinating process versus chronic microvascular disease, unchanged. He had a carotid Doppler study at [facility] on 12/10/2014 with impression: Minimal bilateral atherosclerosis without flow limiting stenosis, less than 50% stenosis bilaterally.  He reports that he started having some symptoms about a month ago. He started having numbness and tingling in his left face and his tongue felt swollen. He had changes in his taste and report some loss of appetite. He has lost weight. He had some grip strength weakness on the right which his wife noted that he tried to downplay it at the time. She became really concerned that she also noted slurring of speech intermittently, especially when he was tired and fatigued. He reports significant fatigue for the past month or 2. He has a history of foot drop and low back pain and these symptoms seem unchanged. Since his MRI earlier this month he was told to stop his baby aspirin and start Plavix. He has been taking Plavix regularly since then. He does admit that he was not always fully compliant with his baby  aspirin. He had some intermittent chest pain. He did not bring this up necessarily at his doctor's appointment. He tries to work out regularly. He has been exercising at the gym 3 to even 4 times a week. He has not seen a cardiologist in years. He does admit to smoking some cigarettes. His wife provides additional information. She states that he tries to drink water and avoid sodas. He has improved  in that regard. He still may not drink enough water. He is fully compliant with CPAP therapy. He had recent supplies delivered.  He had an MRI lumbar spine with and without contrast through Dr. Susa Day on 10/18/2006 (Indication: Left-sided back pain radiating to both legs. Weakness and numbness on the left side, ongoing for 60 months. History per lumbar surgery): 1. Multilevel central, foraminal, and subarticular lateral recess stenoses as detailed above, with the most striking findings at the L3-L4 level. 2. There is a suggestion of dural enhancement and potentially peripheral nerve root clumping in the lower portion of the thecal sac, suspicious for mild arachnoiditis.  I reviewed his CPAP compliance data from 11/21/2014 through 12/20/2014 which is a total of 30 days during which time he used his machine every night with percent used days greater than 4 hours at 100%, indicating superb compliance with an average usage of 7 hours and 16 minutes, residual AHI at 0.8 per hour, leaked low with the 95th percentile at 3.5 L/m on a pressure of 7 cm with EPR of 3.  I saw him on 07/15/2013, at which time we talked about his sleep study results as well as his compliance data. He reported having adjusted well to CPAP, but he had nasal congestion and some nasal dryness. He felt better and his wife endorsed that he slept better.  I reviewed his compliance data from 12/14/2013 through 01/12/2014 which is a total of 30 days during which time he used CPAP every night. Percent used days greater than 4 hours was 100%, indicating superb compliance. Average usage was 8 hours and 12 minutes, residual AHI low at 1.2 per hour and leak very low at 1.2 L per minute at the 95th percentile. Pressure at 7 cm with EPR of 3.     I first met him on 04/12/2013, at which time he complained of snoring and excessive daytime somnolence. I suggested he have a sleep study. He had a split-night sleep study on 05/28/2013 and went  over his test results with him in detail today. His baseline sleep efficiency was reduced at 68.1% with a latency to sleep of 33 minutes and wake after sleep onset of 46.5 minutes with moderate to severe sleep fragmentation noted. His arousal index was mildly elevated. He had an increased percentage of stage I and stage II sleep, absence of slow-wave sleep, and a reduced percentage of REM sleep with a mildly prolonged REM latency. He had no significant EKG changes or periodic leg movements of sleep. He had moderate to loud snoring. He had a total AHI of 15.2 per hour, rising to 38 per hour in REM sleep. His baseline oxygen saturation was 95% with a nadir of 78% during REM sleep. He was then titrated on CPAP. His sleep efficiency improved. His arousal index improved. His sleep architecture showed an increased percentage of stage II sleep, absence of slow-wave sleep and a reduced percentage of REM sleep. Average oxygen saturation was 96%, nadir was 92%, greatly improved. He was titrated on CPAP from 5-7 cm with a reduction of  his AHI to 0 per hour on the final pressure. He had REM sleep but no supine sleep was achieved during this part of the study. He indicated that he does not sleep on his back. He is status post multiple back operations. Based on the test results I prescribed CPAP for him.   I reviewed the patient's CPAP compliance data from 06/09/2013 to 07/08/2013, which is a total of 30 days, during which time the patient used CPAP every day except for 1 day. The average usage for all days was 7 hours and 44 minutes. The percent used days greater than 4 hours was 97 %, indicating excellent compliance. The residual AHI was 1.3 per hour, indicating an appropriate treatment pressure of 7 cwp with EPR of 3.    I reviewed his compliance data of with his machine: 06/10/2013 through 07/14/2013 which is a total of 35 days during which time use CPAP every night with a percent used days greater than 4 hours of 100%,  indicating excellent compliance, average usage was 7 hours and 47 minutes, residual AHI low at 1.3 per hour and leak was low at 1.4. Pressure was 7 cm with EPR of 3.  His Past Medical History Is Significant For: Past Medical History  Diagnosis Date  . Arthritis   . Diabetes mellitus   . Hyperlipidemia   . Hypertension   . CVA (cerebral infarction) 2010  . Foot drop   . Obesity   . ED (erectile dysfunction)   . Unsteady gait   . Spinal stenosis   . Colon polyps   . H/O Bell's palsy   . OSA on CPAP   . Dizziness   . Stroke [facility])     His Past Surgical History Is Significant For: Past Surgical History  Procedure Laterality Date  . Lumbar disc surgery      x6  . Cervical discectomy  2004  . Carpal tunnel release      bilateral    His Family History Is Significant For: Family History  Problem Relation Age of Onset  . Colon cancer Neg Hx   . Stomach cancer Neg Hx   . Multiple sclerosis Sister   . Dementia Mother   . Cancer Father   . Sleep apnea Brother   . Hypertension Brother     His Social History Is Significant For: Social History   Social History  . Marital Status: Married    Spouse Name: Nicki Reaper  . Number of Children: 2  . Years of Education: 14   Occupational History  .      retired   Social History Main Topics  . Smoking status: Current Some Day Smoker    Last Attempt to Quit: 01/21/2011  . Smokeless tobacco: Never Used     Comment: 2-3 times weekly  . Alcohol Use: No  . Drug Use: No  . Sexual Activity: Not Asked   Other Topics Concern  . None   Social History Narrative   Patient is right handed and resides with wife    His Allergies Are:  No Known Allergies:   His Current Medications Are:  Outpatient Encounter Prescriptions as of 04/23/2015  Medication Sig  . atorvastatin (LIPITOR) 40 MG tablet Take 20 mg by mouth at bedtime.   . clopidogrel (PLAVIX) 75 MG tablet Take 75 mg by mouth daily.  . metFORMIN (GLUCOPHAGE) 1000 MG tablet  Take 1,000 mg by mouth 2 (two) times daily with a meal.  . metoprolol (TOPROL XL) 50  MG 24 hr tablet Take 50 mg by mouth daily.    Marland Kitchen oxyCODONE-acetaminophen (PERCOCET/ROXICET) 5-325 MG per tablet TAKE 1 TABLET BY MOUTH 3 TIMES DIALY AS NEEDED  . valsartan (DIOVAN) 320 MG tablet Take 160 mg by mouth daily.   . [DISCONTINUED] aspirin 81 MG tablet Take 81 mg by mouth daily.     Facility-Administered Encounter Medications as of 04/23/2015  Medication  . 0.9 %  sodium chloride infusion  :  Review of Systems:  Out of a complete 14 point review of systems, all are reviewed and negative with the exception of these symptoms as listed below:   Review of Systems  Cardiovascular:       Patient saw cardiologist since last visit.   Neurological:       Patient reports some increased weakness in R arm and R side of body (starting about a month ago).  Patient has completed PT and OT.     Objective:  Neurologic Exam  Physical Exam Physical Examination:   Filed Vitals:   04/23/15 0822  BP: 149/81  Pulse: 70  Resp: 18   General Examination: The patient is a very pleasant 64 y.o. male in no acute distress. He appears well-developed and well-nourished and very well groomed.   HEENT: Normocephalic, atraumatic, pupils are equal, round and reactive to light and accommodation. He has mild bilateral cataracts. Extraocular tracking is good without limitation to gaze excursion or nystagmus noted. Normal smooth pursuit is noted. Hearing is grossly intact. Face is symmetric with normal facial animation and slight decrease in sensation in the right midface. Speech is clear to me with no dysarthria noted and no hypophonia. There is no lip, neck/head, jaw or voice tremor. Neck is supple with full range of passive and active motion. There are no carotid bruits on auscultation. Oropharynx exam reveals: mild to moderate mouth dryness, good dental hygiene and moderate airway crowding, due to redundant soft palate and  floppy, wider based uvula. Mallampati is class III. Tongue protrudes centrally and palate elevates symmetrically. Tonsils are either absent or very small.   Chest: Clear to auscultation without wheezing, rhonchi or crackles noted.  Heart: S1+S2+0, regular and normal without murmurs, rubs or gallops noted.   Abdomen: Soft, non-tender and non-distended with normal bowel sounds appreciated on auscultation.  Extremities: There is slight edema in the distal lower extremities bilaterally. Pedal pulses are intact. His right leg is smaller in caliber than the L, unchanged.   Skin: Warm and dry without trophic changes noted. There are no varicose veins.  Musculoskeletal: exam reveals no obvious joint deformities, tenderness or joint swelling or erythema.   Neurologically:  Mental status: The patient is awake, alert and oriented in all 4 spheres. His memory, attention, language and knowledge are appropriate. There is no aphasia, agnosia, apraxia or anomia. Speech is clear with normal prosody and enunciation. Thought process is linear. Mood is congruent and affect is normal.  Cranial nerves are as described above under HEENT exam. In addition, shoulder shrug is normal with equal shoulder height noted. Motor exam: Normal bulk, strength and tone is noted, with the exception of mild weakness in the R foot dorsiflexion, unchanged . However, he seems to have a very slight right grip strength weakness, better. There is no drift, tremor or rebound. Romberg is Negative with the exception of sway but no corrective steps. Reflexes are 1+. Fine motor skills are intact with normal finger taps, normal hand movements, normal rapid alternating patting, normal foot taps and  normal foot agility.  Cerebellar testing shows no dysmetria or intention tremor on finger to nose testing. Heel to shin is unremarkable bilaterally. There is no truncal or gait ataxia.  Sensory exam is intact to light touch, pinprick, vibration,  temperature sense in the upper and lower extremities, with the exception of decrease in PP and temperature sense in the RLE distally, also unchanged .  Gait, station and balance: He has loss of lumbar lordosis and has a mildly stooped posture, unchanged . No veering to one side is noted. No leaning to one side is noted. Posture is age-appropriate and stance is narrow based. No problems turning are noted. He turns en bloc. Tandem walk is  not possible for him, and this is not new for him. He cannot do toe or heel stance, unchanged.               Assessment and Plan:   In summary, Rodney Crane is a very pleasant 64 year old male with an underlying medical history of hypertension, smoking, hyperlipidemia, type 2 diabetes, degenerative back disease, s/p multiple (7?) back surgeris, and prior TIAs and left thalamic stroke, who presents for FU consultation of his OSA on CPAP and thalamic stroke. I reviewed his latest brain MRI and he had no new findings, which was very reassuring. He had cardiology consultation with good reports, as I understand. His physical exam is stable to slightly improved. He is fully compliant with CPAP therapy and is commended for this. He continues to take Plavix daily. He goes to the gym regularly. He is advised to continue follow-up with his primary care physician for management of cholesterol and blood pressure, etc. At this juncture, he is advised to continue with Plavix daily, and advised to continue to be compliant with CPAP therapy. He is advised to stay active physically and mentally but not to overdo it. At this point, I think I can see him back in one year, sooner if needed. We talked about secondary stroke prevention again today and he is advised to quit smoking altogether. He is encouraged to continue to strive for weight loss.  For any emergency situations such as sudden chest pain, sudden changes in neurological symptoms, he is advised to call 911.    I answered all his  questions today and the patient was in agreement. I encouraged him to call with any interim questions, concerns, problems or updates.  I spent 25 minutes in total face-to-face time with the patient, more than 50% of which was spent in counseling and coordination of care, reviewing test results, reviewing medication and discussing or reviewing the diagnosis of subacute stroke, OSA, the prognosis and treatment options.

## 2015-04-28 DIAGNOSIS — Z23 Encounter for immunization: Secondary | ICD-10-CM | POA: Diagnosis not present

## 2015-06-05 DIAGNOSIS — E1140 Type 2 diabetes mellitus with diabetic neuropathy, unspecified: Secondary | ICD-10-CM | POA: Diagnosis not present

## 2015-06-05 DIAGNOSIS — G4733 Obstructive sleep apnea (adult) (pediatric): Secondary | ICD-10-CM | POA: Diagnosis not present

## 2015-06-05 DIAGNOSIS — E784 Other hyperlipidemia: Secondary | ICD-10-CM | POA: Diagnosis not present

## 2015-06-05 DIAGNOSIS — Z6836 Body mass index (BMI) 36.0-36.9, adult: Secondary | ICD-10-CM | POA: Diagnosis not present

## 2015-06-05 DIAGNOSIS — I10 Essential (primary) hypertension: Secondary | ICD-10-CM | POA: Diagnosis not present

## 2015-06-30 DIAGNOSIS — G8929 Other chronic pain: Secondary | ICD-10-CM | POA: Diagnosis not present

## 2015-06-30 DIAGNOSIS — M5442 Lumbago with sciatica, left side: Secondary | ICD-10-CM | POA: Diagnosis not present

## 2015-06-30 DIAGNOSIS — M961 Postlaminectomy syndrome, not elsewhere classified: Secondary | ICD-10-CM | POA: Diagnosis not present

## 2015-06-30 DIAGNOSIS — M4807 Spinal stenosis, lumbosacral region: Secondary | ICD-10-CM | POA: Diagnosis not present

## 2015-07-15 DIAGNOSIS — M4807 Spinal stenosis, lumbosacral region: Secondary | ICD-10-CM | POA: Diagnosis not present

## 2015-09-08 DIAGNOSIS — H35032 Hypertensive retinopathy, left eye: Secondary | ICD-10-CM | POA: Diagnosis not present

## 2015-09-08 DIAGNOSIS — H25013 Cortical age-related cataract, bilateral: Secondary | ICD-10-CM | POA: Diagnosis not present

## 2015-09-08 DIAGNOSIS — H35031 Hypertensive retinopathy, right eye: Secondary | ICD-10-CM | POA: Diagnosis not present

## 2015-09-08 DIAGNOSIS — H2513 Age-related nuclear cataract, bilateral: Secondary | ICD-10-CM | POA: Diagnosis not present

## 2015-09-08 DIAGNOSIS — E119 Type 2 diabetes mellitus without complications: Secondary | ICD-10-CM | POA: Diagnosis not present

## 2015-09-08 DIAGNOSIS — H40013 Open angle with borderline findings, low risk, bilateral: Secondary | ICD-10-CM | POA: Diagnosis not present

## 2015-09-23 DIAGNOSIS — E1140 Type 2 diabetes mellitus with diabetic neuropathy, unspecified: Secondary | ICD-10-CM | POA: Diagnosis not present

## 2015-09-23 DIAGNOSIS — Z6836 Body mass index (BMI) 36.0-36.9, adult: Secondary | ICD-10-CM | POA: Diagnosis not present

## 2015-09-23 DIAGNOSIS — M791 Myalgia: Secondary | ICD-10-CM | POA: Diagnosis not present

## 2015-11-03 DIAGNOSIS — M4807 Spinal stenosis, lumbosacral region: Secondary | ICD-10-CM | POA: Diagnosis not present

## 2016-01-15 DIAGNOSIS — I10 Essential (primary) hypertension: Secondary | ICD-10-CM | POA: Diagnosis not present

## 2016-01-15 DIAGNOSIS — Z125 Encounter for screening for malignant neoplasm of prostate: Secondary | ICD-10-CM | POA: Diagnosis not present

## 2016-01-15 DIAGNOSIS — E784 Other hyperlipidemia: Secondary | ICD-10-CM | POA: Diagnosis not present

## 2016-01-15 DIAGNOSIS — E1140 Type 2 diabetes mellitus with diabetic neuropathy, unspecified: Secondary | ICD-10-CM | POA: Diagnosis not present

## 2016-01-21 DIAGNOSIS — Z125 Encounter for screening for malignant neoplasm of prostate: Secondary | ICD-10-CM | POA: Diagnosis not present

## 2016-01-21 DIAGNOSIS — J984 Other disorders of lung: Secondary | ICD-10-CM | POA: Diagnosis not present

## 2016-01-21 DIAGNOSIS — E784 Other hyperlipidemia: Secondary | ICD-10-CM | POA: Diagnosis not present

## 2016-01-21 DIAGNOSIS — E1129 Type 2 diabetes mellitus with other diabetic kidney complication: Secondary | ICD-10-CM | POA: Diagnosis not present

## 2016-01-21 DIAGNOSIS — E1121 Type 2 diabetes mellitus with diabetic nephropathy: Secondary | ICD-10-CM | POA: Insufficient documentation

## 2016-01-21 DIAGNOSIS — I10 Essential (primary) hypertension: Secondary | ICD-10-CM | POA: Diagnosis not present

## 2016-01-21 DIAGNOSIS — N400 Enlarged prostate without lower urinary tract symptoms: Secondary | ICD-10-CM | POA: Insufficient documentation

## 2016-01-21 DIAGNOSIS — G4733 Obstructive sleep apnea (adult) (pediatric): Secondary | ICD-10-CM | POA: Diagnosis not present

## 2016-01-21 DIAGNOSIS — Z1389 Encounter for screening for other disorder: Secondary | ICD-10-CM | POA: Diagnosis not present

## 2016-01-21 DIAGNOSIS — G6289 Other specified polyneuropathies: Secondary | ICD-10-CM | POA: Diagnosis not present

## 2016-01-21 DIAGNOSIS — M545 Low back pain: Secondary | ICD-10-CM | POA: Diagnosis not present

## 2016-01-21 DIAGNOSIS — Z6836 Body mass index (BMI) 36.0-36.9, adult: Secondary | ICD-10-CM | POA: Diagnosis not present

## 2016-01-21 DIAGNOSIS — Z23 Encounter for immunization: Secondary | ICD-10-CM | POA: Diagnosis not present

## 2016-01-27 DIAGNOSIS — Z1212 Encounter for screening for malignant neoplasm of rectum: Secondary | ICD-10-CM | POA: Diagnosis not present

## 2016-02-04 DIAGNOSIS — Z23 Encounter for immunization: Secondary | ICD-10-CM | POA: Diagnosis not present

## 2016-02-05 DIAGNOSIS — M4807 Spinal stenosis, lumbosacral region: Secondary | ICD-10-CM | POA: Diagnosis not present

## 2016-02-05 DIAGNOSIS — Z79891 Long term (current) use of opiate analgesic: Secondary | ICD-10-CM | POA: Diagnosis not present

## 2016-02-05 DIAGNOSIS — M961 Postlaminectomy syndrome, not elsewhere classified: Secondary | ICD-10-CM | POA: Diagnosis not present

## 2016-02-05 DIAGNOSIS — M5442 Lumbago with sciatica, left side: Secondary | ICD-10-CM | POA: Diagnosis not present

## 2016-03-09 ENCOUNTER — Ambulatory Visit: Payer: Medicare Other

## 2016-03-09 DIAGNOSIS — M4807 Spinal stenosis, lumbosacral region: Secondary | ICD-10-CM | POA: Diagnosis not present

## 2016-03-10 ENCOUNTER — Encounter: Payer: Self-pay | Admitting: Gastroenterology

## 2016-03-15 DIAGNOSIS — H40013 Open angle with borderline findings, low risk, bilateral: Secondary | ICD-10-CM | POA: Diagnosis not present

## 2016-03-16 ENCOUNTER — Encounter: Payer: Self-pay | Admitting: Internal Medicine

## 2016-03-30 DIAGNOSIS — M62838 Other muscle spasm: Secondary | ICD-10-CM | POA: Diagnosis not present

## 2016-03-30 DIAGNOSIS — M4807 Spinal stenosis, lumbosacral region: Secondary | ICD-10-CM | POA: Diagnosis not present

## 2016-03-30 DIAGNOSIS — M5442 Lumbago with sciatica, left side: Secondary | ICD-10-CM | POA: Diagnosis not present

## 2016-03-30 DIAGNOSIS — Z79891 Long term (current) use of opiate analgesic: Secondary | ICD-10-CM | POA: Diagnosis not present

## 2016-03-30 DIAGNOSIS — G8929 Other chronic pain: Secondary | ICD-10-CM | POA: Diagnosis not present

## 2016-04-15 DIAGNOSIS — M4807 Spinal stenosis, lumbosacral region: Secondary | ICD-10-CM | POA: Diagnosis not present

## 2016-04-15 DIAGNOSIS — Z01812 Encounter for preprocedural laboratory examination: Secondary | ICD-10-CM | POA: Diagnosis not present

## 2016-04-15 DIAGNOSIS — M961 Postlaminectomy syndrome, not elsewhere classified: Secondary | ICD-10-CM | POA: Diagnosis not present

## 2016-04-22 DIAGNOSIS — G8929 Other chronic pain: Secondary | ICD-10-CM | POA: Diagnosis not present

## 2016-04-22 DIAGNOSIS — M5442 Lumbago with sciatica, left side: Secondary | ICD-10-CM | POA: Diagnosis not present

## 2016-04-27 ENCOUNTER — Ambulatory Visit (INDEPENDENT_AMBULATORY_CARE_PROVIDER_SITE_OTHER): Payer: Medicare Other | Admitting: Neurology

## 2016-04-27 ENCOUNTER — Encounter: Payer: Self-pay | Admitting: Neurology

## 2016-04-27 VITALS — BP 154/90 | HR 92 | Ht 73.0 in | Wt 287.0 lb

## 2016-04-27 DIAGNOSIS — G4733 Obstructive sleep apnea (adult) (pediatric): Secondary | ICD-10-CM | POA: Diagnosis not present

## 2016-04-27 DIAGNOSIS — Z9989 Dependence on other enabling machines and devices: Secondary | ICD-10-CM | POA: Diagnosis not present

## 2016-04-27 NOTE — Progress Notes (Signed)
Subjective:    Patient ID: Rodney Crane is a 66 y.o. male.  HPI     Interim history:   Rodney Crane is a very friendly 66 year old right-handed gentleman with an underlying medical history of hypertension, smoking, hyperlipidemia, type 2 diabetes, degenerative back disease, s/p 7 back surgeris, and stroke (left thalamic on MRI), who presents for follow-up consultation follow-up consultation of his stroke and sleep apnea. The patient is unaccompanied today. I last saw him on 04/23/2015, at which time he reported doing well.he had a recent MRI with contrast on 01/01/15. He had seen Dr. Einar Gip in cardiology and a stress test, which was fine, per patient. His brain MRI w contrast did show evidence of an evolving L thalamic infarct. I had suggested a cardiology referral because of chest pains reported.   Today, 04/27/2016: I reviewed his CPAP compliance data from 03/20/2016 through 04/18/2016, which is a total of 30 days, during which time he used his machine only 8 days. His last 90 day compliance showed full compliance until 03/25/2016.  Today, 04/27/2016: He reports having difficulty using his CPAP currently because of flareup of his back condition. He has had multiple back surgeries and is currently unable to sleep in his bed. Had been sleeping on the couch without CPAP. He has been seeing Dr. Rolena Infante. He is on Percocet sparingly, and flexeril and naproxen. He has not been driving lately.     Previously:   I saw him on 12/22/2014 at which time he reported a recent history of left facial numbness and tingling and subjective feeling of tongue swelling. On examination he had decreased sensation of his left midface and very slight right grip strength weakness. On MRI brain he had evidence of a recent left thalamic stroke. We talked about stroke prevention, he was advised to continue with Plavix and I suggested a repeat brain MRI with contrast as well as a stroke consult with Dr. Erlinda Hong. I also suggested a  cardiology referral due to chest pains reported.   I reviewed his CPAP compliance data from 03/22/15 through 04/20/15, which is a total of 30 days during which time the patient used CPAP every night, with percent used days greater than 4 hours at 100%, indicating superb compliance with an average usage of 7 hours and 6 minutes, residual AHI low at 1 per hour, leak low with the 95th percentile at 5.5 L/m on a pressure of 7 cm with EPR of 3.  I saw him on 01/15/2014 for sleep apnea follow-up at which time the patient reported doing well with CPAP and he was using it and was fully compliant with treatment. He reported that he had residual back pain and may need a spinal cord stimulator. He reported a 20 pound weight loss overall. I suggested a one-year follow-up.   He had a brain MRI without contrast on 12/04/2014: Punctate subacute infarction in the left thalamus. Mild leukoarosis, unchanged. This is most likely due to chronic microvascular disease. Increased signal intensity in the pons that may represent a demyelinating process versus chronic microvascular disease, unchanged. He had a carotid Doppler study at [facility] on 12/10/2014 with impression: Minimal bilateral atherosclerosis without flow limiting stenosis, less than 50% stenosis bilaterally.   He reports that he started having some symptoms about a month ago. He started having numbness and tingling in his left face and his tongue felt swollen. He had changes in his taste and report some loss of appetite. He has lost weight. He had some  grip strength weakness on the right which his wife noted that he tried to downplay it at the time. She became really concerned that she also noted slurring of speech intermittently, especially when he was tired and fatigued. He reports significant fatigue for the past month or 2. He has a history of foot drop and low back pain and these symptoms seem unchanged. Since his MRI earlier this month he was  told to stop his baby aspirin and start Plavix. He has been taking Plavix regularly since then. He does admit that he was not always fully compliant with his baby aspirin. He had some intermittent chest pain. He did not bring this up necessarily at his doctor's appointment. He tries to work out regularly. He has been exercising at the gym 3 to even 4 times a week. He has not seen a cardiologist in years. He does admit to smoking some cigarettes. His wife provides additional information. She states that he tries to drink water and avoid sodas. He has improved in that regard. He still may not drink enough water. He is fully compliant with CPAP therapy. He had recent supplies delivered.   He had an MRI lumbar spine with and without contrast through Dr. Susa Day on 10/18/2006 (Indication: Left-sided back pain radiating to both legs. Weakness and numbness on the left side, ongoing for 60 months. History per lumbar surgery): 1. Multilevel central, foraminal, and subarticular lateral recess stenoses as detailed above, with the most striking findings at the L3-L4 level. 2. There is a suggestion of dural enhancement and potentially peripheral nerve root clumping in the lower portion of the thecal sac, suspicious for mild arachnoiditis.   I reviewed his CPAP compliance data from 11/21/2014 through 12/20/2014 which is a total of 30 days during which time he used his machine every night with percent used days greater than 4 hours at 100%, indicating superb compliance with an average usage of 7 hours and 16 minutes, residual AHI at 0.8 per hour, leaked low with the 95th percentile at 3.5 L/m on a pressure of 7 cm with EPR of 3.   I saw him on 07/15/2013, at which time we talked about his sleep study results as well as his compliance data. He reported having adjusted well to CPAP, but he had nasal congestion and some nasal dryness. He felt better and his wife endorsed that he slept better.   I reviewed his  compliance data from 12/14/2013 through 01/12/2014 which is a total of 30 days during which time he used CPAP every night. Percent used days greater than 4 hours was 100%, indicating superb compliance. Average usage was 8 hours and 12 minutes, residual AHI low at 1.2 per hour and leak very low at 1.2 L per minute at the 95th percentile. Pressure at 7 cm with EPR of 3.     I first met him on 04/12/2013, at which time he complained of snoring and excessive daytime somnolence. I suggested he have a sleep study. He had a split-night sleep study on 05/28/2013 and went over his test results with him in detail today. His baseline sleep efficiency was reduced at 68.1% with a latency to sleep of 33 minutes and wake after sleep onset of 46.5 minutes with moderate to severe sleep fragmentation noted. His arousal index was mildly elevated. He had an increased percentage of stage I and stage II sleep, absence of slow-wave sleep, and a reduced percentage of REM sleep with a mildly prolonged REM latency. He  had no significant EKG changes or periodic leg movements of sleep. He had moderate to loud snoring. He had a total AHI of 15.2 per hour, rising to 38 per hour in REM sleep. His baseline oxygen saturation was 95% with a nadir of 78% during REM sleep. He was then titrated on CPAP. His sleep efficiency improved. His arousal index improved. His sleep architecture showed an increased percentage of stage II sleep, absence of slow-wave sleep and a reduced percentage of REM sleep. Average oxygen saturation was 96%, nadir was 92%, greatly improved. He was titrated on CPAP from 5-7 cm with a reduction of his AHI to 0 per hour on the final pressure. He had REM sleep but no supine sleep was achieved during this part of the study. He indicated that he does not sleep on his back. He is status post multiple back operations. Based on the test results I prescribed CPAP for him.   I reviewed the patient's CPAP compliance data from 06/09/2013  to 07/08/2013, which is a total of 30 days, during which time the patient used CPAP every day except for 1 day. The average usage for all days was 7 hours and 44 minutes. The percent used days greater than 4 hours was 97 %, indicating excellent compliance. The residual AHI was 1.3 per hour, indicating an appropriate treatment pressure of 7 cwp with EPR of 3.     I reviewed his compliance data of with his machine: 06/10/2013 through 07/14/2013 which is a total of 35 days during which time use CPAP every night with a percent used days greater than 4 hours of 100%, indicating excellent compliance, average usage was 7 hours and 47 minutes, residual AHI low at 1.3 per hour and leak was low at 1.4. Pressure was 7 cm with EPR of 3.  His Past Medical History Is Significant For: Past Medical History:  Diagnosis Date  . Arthritis   . Colon polyps   . CVA (cerebral infarction) 2010  . Diabetes mellitus   . Dizziness   . ED (erectile dysfunction)   . Foot drop   . H/O Bell's palsy   . Hyperlipidemia   . Hypertension   . Obesity   . OSA on CPAP   . Spinal stenosis   . Stroke ([place])   . Unsteady gait     His Past Surgical History Is Significant For: Past Surgical History:  Procedure Laterality Date  . CARPAL TUNNEL RELEASE     bilateral  . CERVICAL DISCECTOMY  2004  . LUMBAR DISC SURGERY     x6    His Family History Is Significant For: Family History  Problem Relation Age of Onset  . Multiple sclerosis Sister   . Dementia Mother   . Cancer Father   . Sleep apnea Brother   . Hypertension Brother   . Colon cancer Neg Hx   . Stomach cancer Neg Hx     His Social History Is Significant For: Social History   Social History  . Marital status: Married    Spouse name: Rodney Crane  . Number of children: 2  . Years of education: 14   Occupational History  .      retired   Social History Main Topics  . Smoking status: Current Some Day Smoker    Last attempt to quit: 01/21/2011  .  Smokeless tobacco: Never Used     Comment: 2-3 times weekly  . Alcohol use No  . Drug use: No  . Sexual  activity: Not Asked   Other Topics Concern  . None   Social History Narrative   Patient is right handed and resides with wife    His Allergies Are:  No Known Allergies:   His Current Medications Are:  Outpatient Encounter Prescriptions as of 04/27/2016  Medication Sig  . atorvastatin (LIPITOR) 40 MG tablet Take 20 mg by mouth at bedtime.   . clopidogrel (PLAVIX) 75 MG tablet Take 75 mg by mouth daily.  . cyclobenzaprine (FLEXERIL) 10 MG tablet TAKE 1 TABLET BY MOUTH EVERY 8 HOURS AS NEEDED FOR MUSCLE SPASMS  . metFORMIN (GLUCOPHAGE) 1000 MG tablet Take 1,000 mg by mouth 2 (two) times daily with a meal.  . metoprolol (TOPROL XL) 50 MG 24 hr tablet Take 50 mg by mouth daily.    . naproxen (NAPROSYN) 500 MG tablet Take 500 mg by mouth 2 (two) times daily as needed. for pain  . oxyCODONE-acetaminophen (PERCOCET/ROXICET) 5-325 MG per tablet TAKE 1 TABLET BY MOUTH 3 TIMES DIALY AS NEEDED  . valsartan (DIOVAN) 320 MG tablet Take 160 mg by mouth daily.    Facility-Administered Encounter Medications as of 04/27/2016  Medication  . 0.9 %  sodium chloride infusion  :  Review of Systems:  Out of a complete 14 point review of systems, all are reviewed and negative with the exception of these symptoms as listed below:  Review of Systems  Neurological:       Patient states that he has not used his CPAP machine recently becuause he is unable to sleep in bed right now due to a back condition.     Objective:  Neurologic Exam  Physical Exam Physical Examination:   Vitals:   04/27/16 0923  BP: (!) 154/90  Pulse: 92   General Examination: The patient is a very pleasant 66 yo, in no acute distress. He appears well-developed and well-nourished and well groomed. In Good spirits today. Has not taken his blood pressure medication yet.  HEENT: Normocephalic, atraumatic, pupils are equal,  round and reactive to light and accommodation. Extraocular tracking is good without limitation to gaze excursion or nystagmus noted. Normal smooth pursuit is noted. Hearing is grossly intact. Face is symmetric with normal facial animation and slight decrease in sensation in the right midface. Speech is clear to me with no dysarthria noted and no hypophonia. There is no lip, neck/head, jaw or voice tremor. Neck is supple with full range of passive and active motion. There are no carotid bruits on auscultation. Oropharynx exam reveals: no significant changed.   Chest: Clear to auscultation without wheezing, rhonchi or crackles noted.  Heart: S1+S2+0, regular and normal without murmurs, rubs or gallops noted.   Abdomen: Soft, non-tender and non-distended with normal bowel sounds appreciated on auscultation.  Extremities: There is no edema in the distal lower extremities bilaterally. Pedal pulses are intact. His right leg is smaller in caliber than the L, unchanged.   Skin: Warm and dry without trophic changes noted. There are no varicose veins.  Musculoskeletal: exam reveals no obvious joint deformities, tenderness or joint swelling or erythema.   Neurologically:  Mental status: The patient is awake, alert and oriented in all 4 spheres. His memory, attention, language and knowledge are appropriate. There is no aphasia, agnosia, apraxia or anomia. Speech is clear with normal prosody and enunciation. Thought process is linear. Mood is congruent and affect is normal.  Cranial nerves are as described above under HEENT exam. In addition, shoulder shrug is normal with equal shoulder  height noted. Motor exam: Normal bulk, strength and tone is noted, with the exception of mild weakness in the R foot dorsiflexion, unchanged . However, he seems to have a very slight right grip strength weakness, better. There is no drift, tremor or rebound. Romberg is Negative with the exception of sway but no corrective steps.  Reflexes are 1+, in the UEs and trace left knee, otherwise absent. Fine motor skills are intact with normal finger taps, normal hand movements, normal rapid alternating patting, normal foot taps and normal foot agility.  Cerebellar testing shows no dysmetria or intention tremor on finger to nose testing. Heel to shin is unremarkable bilaterally. There is no truncal or gait ataxia.  Sensory exam is intact to light touch, pinprick, vibration, temperature sense in the upper and lower extremities, with the exception of decrease in PP and temperature sense in the RLE distally, also unchanged .  Gait, station and balance: He has loss of lumbar lordosis and has a mildly stooped posture, unchanged . No veering to one side is noted. No leaning to one side is noted. Posture is mildly stooped and he uses a 2 wheeled walker.                Assessment and Plan:   In summary, Rodney Crane is a very pleasant 66 year old male with an underlying medical history of hypertension, smoking, hyperlipidemia, type 2 diabetes, degenerative back disease, s/p multiple (7?) back surgeris, and prior TIAs and left thalamic stroke, who presents for FU consultation of his OSA on CPAP and Hx of thalamic stroke. He has been stable neurologically and has been compliant with CPAP therapy with the exception of this past month when he had worsening of his back issues. He was not able to sleep in his bed. He is encouraged to use his CPAP when he sleeps on the couch. He has improved, takes Flexeril and naproxen as well as narcotic pain medication sparingly. He sees his spine surgeon on a regular basis and also had a recent lumbar spine MRI with results pending, I could not pull up scans her report in [place]. Thankfully, he feels improved. I entered an order for CPAP related supplies and commended him for his treatment adherence. She continues to take Plavix, blood pressure and cholesterol medication. He is advised to take his blood pressure  medication today. He tries to stay active. He is advised not to climb any ladders. I suggested a one-year checkup, sooner as needed. I answered all her questions today and the patient and his wife were in agreement.  I spent 25 minutes in total face-to-face time with the patient, more than 50% of which was spent in counseling and coordination of care, reviewing test results, reviewing medication and discussing or reviewing the diagnosis of subacute stroke, OSA, the prognosis and treatment options.

## 2016-04-27 NOTE — Patient Instructions (Signed)
Please continue using your CPAP regularly. While your insurance requires that you use CPAP at least 4 hours each night on 70% of the nights, I recommend, that you not skip any nights and use it throughout the night if you can. Getting used to CPAP and staying with the treatment long term does take time and patience and discipline. Untreated obstructive sleep apnea when it is moderate to severe can have an adverse impact on cardiovascular health and raise her risk for heart disease, arrhythmias, hypertension, congestive heart failure, stroke and diabetes. Untreated obstructive sleep apnea causes sleep disruption, nonrestorative sleep, and sleep deprivation. This can have an impact on your day to day functioning and cause daytime sleepiness and impairment of cognitive function, memory loss, mood disturbance, and problems focussing. Using CPAP regularly can improve these symptoms.  Keep up the good work! I will see you back in 12 months for sleep apnea check up. Please try to use your machine while sleeping on the cough due to your back issues.

## 2016-05-03 DIAGNOSIS — M4807 Spinal stenosis, lumbosacral region: Secondary | ICD-10-CM | POA: Diagnosis not present

## 2016-05-03 DIAGNOSIS — M961 Postlaminectomy syndrome, not elsewhere classified: Secondary | ICD-10-CM | POA: Diagnosis not present

## 2016-05-18 DIAGNOSIS — M4807 Spinal stenosis, lumbosacral region: Secondary | ICD-10-CM | POA: Diagnosis not present

## 2016-05-18 DIAGNOSIS — Z79891 Long term (current) use of opiate analgesic: Secondary | ICD-10-CM | POA: Diagnosis not present

## 2016-05-18 DIAGNOSIS — M961 Postlaminectomy syndrome, not elsewhere classified: Secondary | ICD-10-CM | POA: Diagnosis not present

## 2016-06-06 DIAGNOSIS — M961 Postlaminectomy syndrome, not elsewhere classified: Secondary | ICD-10-CM | POA: Diagnosis not present

## 2016-06-06 DIAGNOSIS — M4807 Spinal stenosis, lumbosacral region: Secondary | ICD-10-CM | POA: Diagnosis not present

## 2016-06-13 DIAGNOSIS — E0849 Diabetes mellitus due to underlying condition with other diabetic neurological complication: Secondary | ICD-10-CM | POA: Diagnosis not present

## 2016-06-20 ENCOUNTER — Telehealth: Payer: Self-pay | Admitting: Neurology

## 2016-06-20 DIAGNOSIS — M4807 Spinal stenosis, lumbosacral region: Secondary | ICD-10-CM | POA: Diagnosis not present

## 2016-06-20 DIAGNOSIS — E0849 Diabetes mellitus due to underlying condition with other diabetic neurological complication: Secondary | ICD-10-CM | POA: Diagnosis not present

## 2016-06-20 DIAGNOSIS — M961 Postlaminectomy syndrome, not elsewhere classified: Secondary | ICD-10-CM | POA: Diagnosis not present

## 2016-06-20 NOTE — Telephone Encounter (Signed)
Rodney Crane, this is a stat referral from Dr. Melina Schools. He would like patient to be seen on Wednesday for emg/ncs. I spoke to beau and he can perform the NCS at 1030am and follow directly with me for emg/ncs. Would you call and schedule patient please? Hopefully this time works for him. Thank you !!!  [phone]

## 2016-06-21 NOTE — Telephone Encounter (Signed)
Pt called, worked in tomorrow for [place] w/ 10:30 arrival time.

## 2016-06-22 ENCOUNTER — Ambulatory Visit (INDEPENDENT_AMBULATORY_CARE_PROVIDER_SITE_OTHER): Payer: Self-pay | Admitting: Neurology

## 2016-06-22 ENCOUNTER — Other Ambulatory Visit (HOSPITAL_COMMUNITY): Payer: Self-pay | Admitting: Internal Medicine

## 2016-06-22 ENCOUNTER — Ambulatory Visit (INDEPENDENT_AMBULATORY_CARE_PROVIDER_SITE_OTHER): Payer: Medicare Other | Admitting: Neurology

## 2016-06-22 ENCOUNTER — Ambulatory Visit (HOSPITAL_COMMUNITY)
Admission: RE | Admit: 2016-06-22 | Discharge: 2016-06-22 | Disposition: A | Discharge status: Home / Self Care | Payer: Medicare Other | Source: Ambulatory Visit | Attending: Vascular Surgery | Admitting: Vascular Surgery

## 2016-06-22 DIAGNOSIS — M62272 Nontraumatic ischemic infarction of muscle, left ankle and foot: Secondary | ICD-10-CM | POA: Insufficient documentation

## 2016-06-22 DIAGNOSIS — I10 Essential (primary) hypertension: Secondary | ICD-10-CM | POA: Diagnosis not present

## 2016-06-22 DIAGNOSIS — G629 Polyneuropathy, unspecified: Secondary | ICD-10-CM | POA: Diagnosis not present

## 2016-06-22 DIAGNOSIS — Z0289 Encounter for other administrative examinations: Secondary | ICD-10-CM

## 2016-06-22 DIAGNOSIS — R531 Weakness: Secondary | ICD-10-CM | POA: Diagnosis not present

## 2016-06-22 NOTE — Progress Notes (Signed)
Full Name: Rodney Crane Gender: Male MRN #:   [id number] Date of Birth: 1950/07/20    Visit Date: 06/22/2016 10:33 Age: 66 Years 69 Months Old Examining Physician: Sarina Ill, MD  Referring Physician: Dr. 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 Patient fell in early February and hurt his neck.  He fell down, bumped his head and fell into a wall. Since then he has not been able to button his shirts, can't tie shoes, daily progressive worsening, he can't walk with a walker. He can't grip anything. He has lost all his strength. Both arms are involved and both legs, He can barely make a fist and hold utencils. No problems swallowing or speaking. No weight loss. No atrophy. He has swelling in the lower left leg over the last few weeks in the setting of inactivity(sending him to pcp today). His hands are swollen as well. He can't open a jar or open his bottles of medicine. He is falling all the time. MRI of the cervical spine showed higher cervical central canal stenosis.  Progressive. He has chronic right foot numbness and the left foot feels cold.   He endorses worse symptoms in the hands and a chronic right foot drop.  Summary:   The right Radial sensory nerve showed delayed latency(3.54ms,N<2.9) and reduced amplitude (7uV, N>15). The right Sural sensory nerve showed delayed latency(4.26ms,N<4.4) and reduced amplitude (4.9uV, N>6). The right Superficial Peroneal sensory nerve showed delayed latency(4.71ms,N<4.4) and reduced amplitude (2.7uV, N>6). The right Median sensory nerve showed delayed latency(4.4ms,N<3.4) and reduced amplitude (5.5uV, N>10). The right Ulnar sensory nerve(Dig V) showed no response and reduced amplitude (mid palm,  2.9uV, N>11). The right Median motor nerve showed reduced amplitude(2.13mV, N>4). The right Ulnar motor nerve showed decreased velocity(b-elbow-wrist, 53m/s,N>49) and  decreased velocity(a-elbow-b-elbow, 91m/s,N>49)  and a 59m/s (N<10) drop across the elbow segment.  The right Peroneal motor nerve showed no response. The right Tibial motor nerve showed delayed latency (6.1 ms, N<5.8) and reduced amplitude(1.64mV, N>4) and decreased velocity (pop-fossa-ankle, 77m/s, N>41). The right Ulnar F wave showed delayed latency (40.59ms, N<32), the right Tibial F Wave showed no response. Patient could not activate multiple lower extremity muscles due to weakness (see chart below). The right Tibialis Anterior and Abductor Hallucis muscles showed increased spontaneous activity (+1 PSW).The right Biceps Brachii showed reduced motor unit recruitment and the right Pronator Teres showed giant motor unit amplitude and reduced motor unit recruitment   Conclusion: There is electrodiagnostic evidence of chronic length-dependent sensorimotor axonal neuropathy. No suggestion of myopathy/myositis. 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 The Tibialis Anterior showed acute/ongoing denervation which could be due to radiculopathy or neuropathy. I would have expected more demyelinating-rage changes by now on NCV if the etiology of his weakness were due to demyelinating disease (such as Acute Inflammatory Demyelinating Polyneuropathy). Given history of worsening weakness after fall and neck injury, likely severe cervical stenosis as cause for his symptoms.   Sarina Ill M.D.  [facility] [place], [place] [address] Tel: [phone] Fax: [phone]        [facility]    Nerve / Sites Rec. Site Peak Lat Ref. Amp.1-2 Ref. Distance    ms ms V V  cm  R Radial - Anatomical snuff box (Forearm)     Forearm Wrist 3.02 ?2.90 7.0 ?15.0 10  R Sural -  Ankle (Calf)     Calf Ankle 4.58 ?4.40 4.9 ?6.0 14  R Superficial peroneal - Ankle     Lat leg Ankle 4.64 ?4.40 2.7 ?6.0 14  R Median - Orthodromic (Dig II, Mid palm)     Dig II Wrist 4.17 ?3.40 5.5 ?10.0 13  R Ulnar - Orthodromic, (Dig V, Mid palm)     Dig V Wrist NR ?3.10 NR ?5.0 11     Mid palm Wrist 2.19 ?2.30 2.9 ?11.0 8     [facility]    Nerve / Sites Muscle Latency Ref. Amplitude Ref. Rel Amp Segments Distance Lat Diff Velocity Ref. Area    ms ms mV mV %  cm ms m/s m/s mVms  R Median - APB     Wrist APB 4.2 ?4.4 2.6 ?4.0 100 Wrist - APB 7    13.5     Upper arm APB 10.8  3.0  112 Upper arm - Wrist 28 6.6 43  14.1  R Ulnar - ADM     Wrist ADM 3.3 ?3.3 6.9 ?6.0 100 Wrist - ADM 7    27.7     B.Elbow ADM 8.0  6.0  86.8 B.Elbow - Wrist 22 4.7 46 ?49 26.9     A.Elbow ADM 12.1  5.2  86.6 A.Elbow - B.Elbow 12 4.1 30 ?49 24.3         A.Elbow - Wrist  8.8     R Peroneal - EDB     Ankle EDB NR ?6.5 NR ?2.0 NR Ankle - EDB 9    NR     Fib head EDB NR  NR  NR Fib head - Ankle  NR  ?44 NR         Pop fossa - Ankle  NR     R Tibial - AH     Ankle AH 6.1 ?5.8 1.5 ?4.0 100 Ankle - AH 9    3.9     Pop fossa AH 16.9  0.6  40.3 Pop fossa - Ankle 40 10.7 37 ?41 2.5     F  Wave    Nerve F Lat Ref.   ms ms  R Ulnar - ADM 40.8 ?32.0  R Tibial - AH NR ?56.0     EMG full        EMG Summary Table    Spontaneous MUAP Recruitment  Muscle IA Fib PSW Fasc Other Amp Dur. Poly Pattern  R. Vastus medialis Normal None None None Unable to activate due to Weakness _______ _______ _______ _______  R. Abductor hallucis Decreased due to atrophy None +1 None Unable to activate due to Weakness _______ ______ _______ _______  R. Biceps brachii Normal None None None _______ Normal Normal Normal Reduced  R. Biceps femoris (long head) Normal None None None _______ Normal Normal Normal Normal  R. Deltoid Normal None None None _______ Normal Normal Normal Normal  R. Extensor hallucis longus Normal None None None  Unable to activate due to Weakness _______ ______ _______ _______  R. First dorsal interosseous Normal None None None _______ Normal Normal Normal Normal  R. Flexor digitorum profundus (Ulnar) Normal None None None _______ Normal Normal Normal Normal  R. Gastrocnemius (Medial head) Normal None None None Unable to activate due to Weakness _______ ______ _______ _______  R. Gluteus maximus Normal None None None _______  Normal Normal Normal Normal  R. Iliopsoas Normal None None None Unable to activate due to Weakness _______ _______ _______ _______  R. Opponens pollicis Normal None None None _______ Normal Normal Normal Normal  R. Pronator teres Normal None None None Unable to activate due to Weakness Giant ______ _______ Reduced  R. Thoracic paraspinals (low) Normal None None None _______ Normal Normal Normal Normal  R. Tibialis anterior Normal None +1 None Unable to activate due to Weakness _______ _______ _______ _______  R. Triceps brachii Normal None None None _______ Normal Normal Normal Normal  R. Vastus lateralis Normal None None None Unable to activate due to Weakness _______ _______ _______ _______

## 2016-06-22 NOTE — Progress Notes (Signed)
See procedure note.

## 2016-06-23 ENCOUNTER — Ambulatory Visit (INDEPENDENT_AMBULATORY_CARE_PROVIDER_SITE_OTHER)
Admission: RE | Admit: 2016-06-23 | Discharge: 2016-06-23 | Disposition: A | Discharge status: Home / Self Care | Payer: Medicare Other | Source: Ambulatory Visit | Attending: Vascular Surgery | Admitting: Vascular Surgery

## 2016-06-23 ENCOUNTER — Encounter: Payer: Self-pay | Admitting: Vascular Surgery

## 2016-06-23 ENCOUNTER — Other Ambulatory Visit: Payer: Self-pay | Admitting: Vascular Surgery

## 2016-06-23 ENCOUNTER — Ambulatory Visit (INDEPENDENT_AMBULATORY_CARE_PROVIDER_SITE_OTHER): Payer: Medicare Other | Admitting: Vascular Surgery

## 2016-06-23 VITALS — BP 174/101 | HR 89 | Temp 97.0°F | Resp 22 | Ht 73.0 in | Wt 287.0 lb

## 2016-06-23 DIAGNOSIS — Z7984 Long term (current) use of oral hypoglycemic drugs: Secondary | ICD-10-CM

## 2016-06-23 DIAGNOSIS — M7989 Other specified soft tissue disorders: Secondary | ICD-10-CM | POA: Diagnosis not present

## 2016-06-23 DIAGNOSIS — R209 Unspecified disturbances of skin sensation: Secondary | ICD-10-CM

## 2016-06-23 DIAGNOSIS — E669 Obesity, unspecified: Secondary | ICD-10-CM | POA: Diagnosis present

## 2016-06-23 DIAGNOSIS — W19XXXA Unspecified fall, initial encounter: Secondary | ICD-10-CM | POA: Diagnosis present

## 2016-06-23 DIAGNOSIS — M4802 Spinal stenosis, cervical region: Secondary | ICD-10-CM | POA: Diagnosis present

## 2016-06-23 DIAGNOSIS — M21379 Foot drop, unspecified foot: Secondary | ICD-10-CM | POA: Diagnosis present

## 2016-06-23 DIAGNOSIS — E1151 Type 2 diabetes mellitus with diabetic peripheral angiopathy without gangrene: Secondary | ICD-10-CM | POA: Diagnosis present

## 2016-06-23 DIAGNOSIS — I110 Hypertensive heart disease with heart failure: Secondary | ICD-10-CM | POA: Diagnosis present

## 2016-06-23 DIAGNOSIS — R296 Repeated falls: Secondary | ICD-10-CM | POA: Diagnosis present

## 2016-06-23 DIAGNOSIS — I5032 Chronic diastolic (congestive) heart failure: Secondary | ICD-10-CM | POA: Diagnosis present

## 2016-06-23 DIAGNOSIS — Z981 Arthrodesis status: Secondary | ICD-10-CM

## 2016-06-23 DIAGNOSIS — J9601 Acute respiratory failure with hypoxia: Secondary | ICD-10-CM | POA: Diagnosis not present

## 2016-06-23 DIAGNOSIS — Z791 Long term (current) use of non-steroidal anti-inflammatories (NSAID): Secondary | ICD-10-CM

## 2016-06-23 DIAGNOSIS — E785 Hyperlipidemia, unspecified: Secondary | ICD-10-CM | POA: Diagnosis present

## 2016-06-23 DIAGNOSIS — E1142 Type 2 diabetes mellitus with diabetic polyneuropathy: Secondary | ICD-10-CM | POA: Diagnosis present

## 2016-06-23 DIAGNOSIS — K5903 Drug induced constipation: Secondary | ICD-10-CM | POA: Diagnosis not present

## 2016-06-23 DIAGNOSIS — M4712 Other spondylosis with myelopathy, cervical region: Secondary | ICD-10-CM | POA: Diagnosis present

## 2016-06-23 DIAGNOSIS — F17210 Nicotine dependence, cigarettes, uncomplicated: Secondary | ICD-10-CM | POA: Diagnosis present

## 2016-06-23 DIAGNOSIS — Z8673 Personal history of transient ischemic attack (TIA), and cerebral infarction without residual deficits: Secondary | ICD-10-CM

## 2016-06-23 DIAGNOSIS — G4733 Obstructive sleep apnea (adult) (pediatric): Secondary | ICD-10-CM | POA: Diagnosis present

## 2016-06-23 DIAGNOSIS — S14122A Central cord syndrome at C2 level of cervical spinal cord, initial encounter: Principal | ICD-10-CM | POA: Diagnosis present

## 2016-06-23 DIAGNOSIS — Z7902 Long term (current) use of antithrombotics/antiplatelets: Secondary | ICD-10-CM

## 2016-06-23 DIAGNOSIS — T402X5A Adverse effect of other opioids, initial encounter: Secondary | ICD-10-CM | POA: Diagnosis not present

## 2016-06-23 DIAGNOSIS — D649 Anemia, unspecified: Secondary | ICD-10-CM | POA: Diagnosis not present

## 2016-06-23 DIAGNOSIS — Z6838 Body mass index (BMI) 38.0-38.9, adult: Secondary | ICD-10-CM

## 2016-06-23 NOTE — Progress Notes (Signed)
Referring Physician: Lang Snow MD  Patient name: Rodney Crane MRN: [id number] DOB: 1951/02/20 Sex: male  REASON FOR CONSULT: Left leg swelling HPI: Rodney Crane is a 67 y.o. male currently undergoing evaluation for upper and lower extremity weakness. He recently noticed some swelling in his left leg. He denies any pain in his left foot. His right foot has been numb for about 10 years. He states the left foot became numb about 2 months ago. He has frequent falls due to gait instability. He is not able to ambulate enough to elicit claudication symptoms. He has no nonhealing wounds on his foot. He had noticed recently that his left foot was cooler than his right. He was noted by Dr. Brigitte Pulse to have an abnormal pulse exam on the left foot. He was sent for further evaluation to [place]. Other medical problems include arthritis, diabetes, peripheral neuropathy, hypertension, hyperlipidemia, sleep apnea, unsteady gait. His diabetes has overall been fairly stable. He is currently still undergoing workup for multi-level spine arthritis peripheral neuropathy and unsteady gait. He is essentially wheelchair-bound at this point.  Past Medical History:  Diagnosis Date  . Arthritis   . Colon polyps   . CVA (cerebral infarction) 2010  . Diabetes mellitus   . Dizziness   . ED (erectile dysfunction)   . Foot drop   . H/O Bell's palsy   . Hyperlipidemia   . Hypertension   . Obesity   . OSA on CPAP   . Spinal stenosis   . Stroke ([place])   . Unsteady gait    Past Surgical History:  Procedure Laterality Date  . CARPAL TUNNEL RELEASE     bilateral  . CERVICAL DISCECTOMY  2004  . LUMBAR DISC SURGERY     x6    Family History  Problem Relation Age of Onset  . Multiple sclerosis Sister   . Dementia Mother   . Cancer Father   . Sleep apnea Brother   . Hypertension Brother   . Colon cancer Neg Hx   . Stomach cancer Neg Hx     SOCIAL HISTORY: Social History   Social History  . Marital status: Married     Spouse name: Nicki Reaper  . Number of children: 2  . Years of education: 14   Occupational History  .      retired   Social History Main Topics  . Smoking status: Current Some Day Smoker    Last attempt to quit: 01/21/2011  . Smokeless tobacco: Never Used     Comment: 2-3 times weekly  . Alcohol use No  . Drug use: No  . Sexual activity: Not on file   Other Topics Concern  . Not on file   Social History Narrative   Patient is right handed and resides with wife    No Known Allergies  Current Outpatient Prescriptions  Medication Sig Dispense Refill  . atorvastatin (LIPITOR) 40 MG tablet Take 20 mg by mouth at bedtime.   7  . clopidogrel (PLAVIX) 75 MG tablet Take 75 mg by mouth daily.  6  . cyclobenzaprine (FLEXERIL) 10 MG tablet TAKE 1 TABLET BY MOUTH EVERY 8 HOURS AS NEEDED FOR MUSCLE SPASMS  1  . metFORMIN (GLUCOPHAGE) 1000 MG tablet Take 1,000 mg by mouth 2 (two) times daily with a meal.    . metoprolol (TOPROL XL) 50 MG 24 hr tablet Take 50 mg by mouth daily.      . naproxen (NAPROSYN) 500 MG tablet Take 500 mg  by mouth 2 (two) times daily as needed. for pain  1  . oxyCODONE-acetaminophen (PERCOCET/ROXICET) 5-325 MG per tablet TAKE 1 TABLET BY MOUTH 3 TIMES DIALY AS NEEDED  0  . valsartan (DIOVAN) 320 MG tablet Take 160 mg by mouth daily.   5   Current Facility-Administered Medications  Medication Dose Route Frequency Provider Last Rate Last Dose  . 0.9 %  sodium chloride infusion  500 mL Intravenous Continuous Irene Shipper, MD        ROS:   General:  No weight loss, Fever, chills  HEENT: No recent headaches, no nasal bleeding, no visual changes, no sore throat  Neurologic: No dizziness, blackouts, seizures. No recent symptoms of stroke or mini- stroke. No recent episodes of slurred speech, or temporary blindness.  Cardiac: No recent episodes of chest pain/pressure, no shortness of breath at rest.  + shortness of breath with exertion.  Denies history of atrial  fibrillation or irregular heartbeat  Vascular: No history of rest pain in feet.  No history of claudication.  No history of non-healing ulcer, No history of DVT   Pulmonary: No home oxygen, no productive cough, no hemoptysis,  No asthma or wheezing  Musculoskeletal:  [X]  Arthritis, [X]  Low back pain,  [X]  Joint pain  Hematologic:No history of hypercoagulable state.  No history of easy bleeding.  No history of anemia  Gastrointestinal: No hematochezia or melena,  No gastroesophageal reflux, no trouble swallowing  Urinary: [ ]  chronic Kidney disease, [ ]  on HD - [ ]  MWF or [ ]  TTHS, [ ]  Burning with urination, [ ]  Frequent urination, [ ]  Difficulty urinating;   Skin: No rashes  Psychological: No history of anxiety,  No history of depression   Physical Examination  Vitals:   06/23/16 1008  BP: (!) 174/101  Pulse: 89  Resp: (!) 22  Temp: 97 F (36.1 C)  TempSrc: Oral  SpO2: 100%  Weight: 287 lb (130.2 kg)  Height: 6\' 1"  (1.854 m)    Body mass index is 37.87 kg/m.  General:  Alert and oriented, no acute distress, very weak overall unable to transfer from his wheelchair to the examining table or even to an additional chair without significant help from 3-4 people. HEENT: Normal Neck: No bruit or JVD Pulmonary: Clear to auscultation bilaterally Cardiac: Regular Rate and Rhythm without murmur Abdomen: Soft, non-tender, non-distended, no mass, no scars Skin: No rash Extremity Pulses:  2+ radial, brachial, femoral, 2+ right dorsalis pedis, absent left dorsalis pedis and posterior tibial pulses, left foot slightly cooler than right no ulcerations bilateral slight decreased sensation fairly symmetric, trace edema left leg extending into the foot and ankle and up to the pretibial region compared to right Musculoskeletal: No deformity Neurologic: Upper and lower extremity motor 4/5 and symmetric  DATA:  Patient had a left lower extremity arterial duplex exam yesterday which showed  no significant stenosis from the common femoral all the way down into the tibial vessels there were triphasic to biphasic waveforms. He also had a DVT ultrasound today which showed no evidence of DVT.  ASSESSMENT:  Bilateral lower extremity weakness does not seem to have a vascular etiology. The edema and coolness in his left leg do not seem to be due to any large vessel arterial or venous occlusive disease. Some of the swelling in the left leg may be secondary to dependency. Some of the changes in the left leg also may be due to autonomic neuropathy. There is nothing on his noninvasive  exam or with my clinical exam to suggest any vascular surgical intervention currently. He did have evidence of some small vessel disease in the toes and the left foot. However I discussed with the patient and his wife today that this would need medical management with control of his diabetes and protection of his feet that we are unable to revascularize very small vessels out in the feet. I do not believe this is the cause of his lower extremity weakness.   PLAN:  He will follow-up with me on as-needed basis if he develops any nonhealing wounds on his left foot or develops rest pain in his left foot.   Ruta Hinds, MD [facility] Office: [phone] Pager: [phone]

## 2016-06-25 ENCOUNTER — Inpatient Hospital Stay (HOSPITAL_COMMUNITY)
Admission: EM | Admit: 2016-06-25 | Discharge: 2016-07-04 | DRG: 028 | Disposition: A | Discharge status: Rehabilitation Facility | Payer: Medicare Other | Attending: Family Medicine | Admitting: Family Medicine

## 2016-06-25 ENCOUNTER — Emergency Department (HOSPITAL_COMMUNITY): Payer: Medicare Other

## 2016-06-25 ENCOUNTER — Encounter (HOSPITAL_COMMUNITY): Payer: Self-pay

## 2016-06-25 ENCOUNTER — Inpatient Hospital Stay (HOSPITAL_COMMUNITY): Payer: Medicare Other

## 2016-06-25 DIAGNOSIS — Z9989 Dependence on other enabling machines and devices: Secondary | ICD-10-CM

## 2016-06-25 DIAGNOSIS — I110 Hypertensive heart disease with heart failure: Secondary | ICD-10-CM | POA: Diagnosis present

## 2016-06-25 DIAGNOSIS — Z79899 Other long term (current) drug therapy: Secondary | ICD-10-CM | POA: Diagnosis not present

## 2016-06-25 DIAGNOSIS — E1151 Type 2 diabetes mellitus with diabetic peripheral angiopathy without gangrene: Secondary | ICD-10-CM | POA: Diagnosis present

## 2016-06-25 DIAGNOSIS — Z981 Arthrodesis status: Secondary | ICD-10-CM

## 2016-06-25 DIAGNOSIS — Z8673 Personal history of transient ischemic attack (TIA), and cerebral infarction without residual deficits: Secondary | ICD-10-CM

## 2016-06-25 DIAGNOSIS — I82442 Acute embolism and thrombosis of left tibial vein: Secondary | ICD-10-CM | POA: Diagnosis present

## 2016-06-25 DIAGNOSIS — I519 Heart disease, unspecified: Secondary | ICD-10-CM | POA: Diagnosis not present

## 2016-06-25 DIAGNOSIS — S14121A Central cord syndrome at C1 level of cervical spinal cord, initial encounter: Secondary | ICD-10-CM | POA: Diagnosis not present

## 2016-06-25 DIAGNOSIS — S14129A Central cord syndrome at unspecified level of cervical spinal cord, initial encounter: Secondary | ICD-10-CM

## 2016-06-25 DIAGNOSIS — R609 Edema, unspecified: Secondary | ICD-10-CM

## 2016-06-25 DIAGNOSIS — Z720 Tobacco use: Secondary | ICD-10-CM | POA: Diagnosis not present

## 2016-06-25 DIAGNOSIS — J9601 Acute respiratory failure with hypoxia: Secondary | ICD-10-CM | POA: Diagnosis not present

## 2016-06-25 DIAGNOSIS — E785 Hyperlipidemia, unspecified: Secondary | ICD-10-CM

## 2016-06-25 DIAGNOSIS — R29898 Other symptoms and signs involving the musculoskeletal system: Secondary | ICD-10-CM | POA: Diagnosis present

## 2016-06-25 DIAGNOSIS — D649 Anemia, unspecified: Secondary | ICD-10-CM | POA: Diagnosis not present

## 2016-06-25 DIAGNOSIS — R140 Abdominal distension (gaseous): Secondary | ICD-10-CM | POA: Diagnosis not present

## 2016-06-25 DIAGNOSIS — R0609 Other forms of dyspnea: Secondary | ICD-10-CM | POA: Diagnosis not present

## 2016-06-25 DIAGNOSIS — M5002 Cervical disc disorder with myelopathy, mid-cervical region, unspecified level: Secondary | ICD-10-CM | POA: Diagnosis not present

## 2016-06-25 DIAGNOSIS — J439 Emphysema, unspecified: Secondary | ICD-10-CM | POA: Diagnosis not present

## 2016-06-25 DIAGNOSIS — K592 Neurogenic bowel, not elsewhere classified: Secondary | ICD-10-CM | POA: Diagnosis not present

## 2016-06-25 DIAGNOSIS — M4808 Spinal stenosis, sacral and sacrococcygeal region: Secondary | ICD-10-CM | POA: Diagnosis not present

## 2016-06-25 DIAGNOSIS — Z4789 Encounter for other orthopedic aftercare: Secondary | ICD-10-CM | POA: Diagnosis present

## 2016-06-25 DIAGNOSIS — I5189 Other ill-defined heart diseases: Secondary | ICD-10-CM

## 2016-06-25 DIAGNOSIS — Z7902 Long term (current) use of antithrombotics/antiplatelets: Secondary | ICD-10-CM | POA: Diagnosis not present

## 2016-06-25 DIAGNOSIS — Z791 Long term (current) use of non-steroidal anti-inflammatories (NSAID): Secondary | ICD-10-CM | POA: Diagnosis not present

## 2016-06-25 DIAGNOSIS — D72829 Elevated white blood cell count, unspecified: Secondary | ICD-10-CM

## 2016-06-25 DIAGNOSIS — I10 Essential (primary) hypertension: Secondary | ICD-10-CM

## 2016-06-25 DIAGNOSIS — R109 Unspecified abdominal pain: Secondary | ICD-10-CM

## 2016-06-25 DIAGNOSIS — E669 Obesity, unspecified: Secondary | ICD-10-CM | POA: Diagnosis present

## 2016-06-25 DIAGNOSIS — G8250 Quadriplegia, unspecified: Secondary | ICD-10-CM | POA: Diagnosis present

## 2016-06-25 DIAGNOSIS — M549 Dorsalgia, unspecified: Secondary | ICD-10-CM | POA: Diagnosis not present

## 2016-06-25 DIAGNOSIS — J9690 Respiratory failure, unspecified, unspecified whether with hypoxia or hypercapnia: Secondary | ICD-10-CM | POA: Diagnosis not present

## 2016-06-25 DIAGNOSIS — Z978 Presence of other specified devices: Secondary | ICD-10-CM

## 2016-06-25 DIAGNOSIS — M4712 Other spondylosis with myelopathy, cervical region: Secondary | ICD-10-CM | POA: Diagnosis present

## 2016-06-25 DIAGNOSIS — E1142 Type 2 diabetes mellitus with diabetic polyneuropathy: Secondary | ICD-10-CM

## 2016-06-25 DIAGNOSIS — E1165 Type 2 diabetes mellitus with hyperglycemia: Secondary | ICD-10-CM | POA: Diagnosis not present

## 2016-06-25 DIAGNOSIS — M4802 Spinal stenosis, cervical region: Secondary | ICD-10-CM | POA: Diagnosis not present

## 2016-06-25 DIAGNOSIS — M50022 Cervical disc disorder at C5-C6 level with myelopathy: Secondary | ICD-10-CM | POA: Diagnosis not present

## 2016-06-25 DIAGNOSIS — M79609 Pain in unspecified limb: Secondary | ICD-10-CM | POA: Diagnosis not present

## 2016-06-25 DIAGNOSIS — D62 Acute posthemorrhagic anemia: Secondary | ICD-10-CM | POA: Diagnosis not present

## 2016-06-25 DIAGNOSIS — I5032 Chronic diastolic (congestive) heart failure: Secondary | ICD-10-CM | POA: Diagnosis not present

## 2016-06-25 DIAGNOSIS — R296 Repeated falls: Secondary | ICD-10-CM | POA: Diagnosis present

## 2016-06-25 DIAGNOSIS — R531 Weakness: Secondary | ICD-10-CM | POA: Diagnosis not present

## 2016-06-25 DIAGNOSIS — M48061 Spinal stenosis, lumbar region without neurogenic claudication: Secondary | ICD-10-CM | POA: Diagnosis not present

## 2016-06-25 DIAGNOSIS — M50021 Cervical disc disorder at C4-C5 level with myelopathy: Secondary | ICD-10-CM | POA: Diagnosis not present

## 2016-06-25 DIAGNOSIS — Z419 Encounter for procedure for purposes other than remedying health state, unspecified: Secondary | ICD-10-CM | POA: Diagnosis not present

## 2016-06-25 DIAGNOSIS — Z7984 Long term (current) use of oral hypoglycemic drugs: Secondary | ICD-10-CM | POA: Diagnosis not present

## 2016-06-25 DIAGNOSIS — R000 Tachycardia, unspecified: Secondary | ICD-10-CM

## 2016-06-25 DIAGNOSIS — S14129D Central cord syndrome at unspecified level of cervical spinal cord, subsequent encounter: Secondary | ICD-10-CM | POA: Diagnosis not present

## 2016-06-25 DIAGNOSIS — G4733 Obstructive sleep apnea (adult) (pediatric): Secondary | ICD-10-CM | POA: Diagnosis not present

## 2016-06-25 DIAGNOSIS — Z9889 Other specified postprocedural states: Secondary | ICD-10-CM

## 2016-06-25 DIAGNOSIS — E138 Other specified diabetes mellitus with unspecified complications: Secondary | ICD-10-CM | POA: Diagnosis not present

## 2016-06-25 DIAGNOSIS — W19XXXA Unspecified fall, initial encounter: Secondary | ICD-10-CM | POA: Diagnosis present

## 2016-06-25 DIAGNOSIS — K5900 Constipation, unspecified: Secondary | ICD-10-CM | POA: Diagnosis not present

## 2016-06-25 DIAGNOSIS — M4302 Spondylolysis, cervical region: Secondary | ICD-10-CM | POA: Diagnosis not present

## 2016-06-25 DIAGNOSIS — G959 Disease of spinal cord, unspecified: Secondary | ICD-10-CM

## 2016-06-25 DIAGNOSIS — E119 Type 2 diabetes mellitus without complications: Secondary | ICD-10-CM

## 2016-06-25 DIAGNOSIS — M7989 Other specified soft tissue disorders: Secondary | ICD-10-CM | POA: Diagnosis not present

## 2016-06-25 DIAGNOSIS — I824Z3 Acute embolism and thrombosis of unspecified deep veins of distal lower extremity, bilateral: Secondary | ICD-10-CM | POA: Diagnosis not present

## 2016-06-25 DIAGNOSIS — F17210 Nicotine dependence, cigarettes, uncomplicated: Secondary | ICD-10-CM | POA: Diagnosis not present

## 2016-06-25 DIAGNOSIS — S14129S Central cord syndrome at unspecified level of cervical spinal cord, sequela: Secondary | ICD-10-CM | POA: Diagnosis not present

## 2016-06-25 DIAGNOSIS — I82403 Acute embolism and thrombosis of unspecified deep veins of lower extremity, bilateral: Secondary | ICD-10-CM | POA: Diagnosis not present

## 2016-06-25 DIAGNOSIS — S14122A Central cord syndrome at C2 level of cervical spinal cord, initial encounter: Secondary | ICD-10-CM | POA: Diagnosis present

## 2016-06-25 DIAGNOSIS — K5903 Drug induced constipation: Secondary | ICD-10-CM | POA: Diagnosis not present

## 2016-06-25 DIAGNOSIS — S0990XA Unspecified injury of head, initial encounter: Secondary | ICD-10-CM | POA: Diagnosis not present

## 2016-06-25 DIAGNOSIS — Z6838 Body mass index (BMI) 38.0-38.9, adult: Secondary | ICD-10-CM | POA: Diagnosis not present

## 2016-06-25 DIAGNOSIS — G8918 Other acute postprocedural pain: Secondary | ICD-10-CM

## 2016-06-25 DIAGNOSIS — Z4682 Encounter for fitting and adjustment of non-vascular catheter: Secondary | ICD-10-CM | POA: Diagnosis not present

## 2016-06-25 DIAGNOSIS — T402X5A Adverse effect of other opioids, initial encounter: Secondary | ICD-10-CM | POA: Diagnosis not present

## 2016-06-25 DIAGNOSIS — M21379 Foot drop, unspecified foot: Secondary | ICD-10-CM | POA: Diagnosis present

## 2016-06-25 LAB — COMPREHENSIVE METABOLIC PANEL
ALK PHOS: 99 U/L (ref 38–126)
ALT: 27 U/L (ref 17–63)
AST: 24 U/L (ref 15–41)
Albumin: 3.7 g/dL (ref 3.5–5.0)
Anion gap: 5 (ref 5–15)
BUN: 19 mg/dL (ref 6–20)
CALCIUM: 9.4 mg/dL (ref 8.9–10.3)
CHLORIDE: 106 mmol/L (ref 101–111)
CO2: 26 mmol/L (ref 22–32)
CREATININE: 1.15 mg/dL (ref 0.61–1.24)
GFR calc Af Amer: >60 mL/min (ref >60)
Glucose, Bld: 116 mg/dL — ABNORMAL HIGH (ref 65–99)
Potassium: 4.4 mmol/L (ref 3.5–5.1)
Sodium: 137 mmol/L (ref 135–145)
Total Bilirubin: 0.8 mg/dL (ref 0.3–1.2)
Total Protein: 7.3 g/dL (ref 6.5–8.1)

## 2016-06-25 LAB — C-REACTIVE PROTEIN: CRP: 1.1 mg/dL — ABNORMAL HIGH (ref <1.0)

## 2016-06-25 LAB — CBC WITH DIFFERENTIAL/PLATELET
BASOS ABS: 0 10*3/uL (ref 0.0–0.1)
Basophils Relative: 0 %
EOS PCT: 7 %
Eosinophils Absolute: 0.6 10*3/uL (ref 0.0–0.7)
HEMATOCRIT: 37.6 % — AB (ref 39.0–52.0)
Hemoglobin: 12.5 g/dL — ABNORMAL LOW (ref 13.0–17.0)
LYMPHS ABS: 3 10*3/uL (ref 0.7–4.0)
LYMPHS PCT: 37 %
MCH: 29.6 pg (ref 26.0–34.0)
MCHC: 33.2 g/dL (ref 30.0–36.0)
MCV: 89.1 fL (ref 78.0–100.0)
Monocytes Absolute: 0.7 10*3/uL (ref 0.1–1.0)
Monocytes Relative: 9 %
NEUTROS ABS: 3.7 10*3/uL (ref 1.7–7.7)
NEUTROS PCT: 47 %
PLATELETS: 307 10*3/uL (ref 150–400)
RBC: 4.22 MIL/uL (ref 4.22–5.81)
RDW: 13.3 % (ref 11.5–15.5)
WBC: 8 10*3/uL (ref 4.0–10.5)

## 2016-06-25 LAB — SEDIMENTATION RATE: SED RATE: 12 mm/h (ref 0–16)

## 2016-06-25 LAB — GLUCOSE, CAPILLARY: Glucose-Capillary: 96 mg/dL (ref 65–99)

## 2016-06-25 LAB — BRAIN NATRIURETIC PEPTIDE: B NATRIURETIC PEPTIDE 5: 7.6 pg/mL (ref 0.0–100.0)

## 2016-06-25 LAB — CBG MONITORING, ED: Glucose-Capillary: 155 mg/dL — ABNORMAL HIGH (ref 65–99)

## 2016-06-25 MED ORDER — SODIUM CHLORIDE 0.9% FLUSH: 3.0000 mL | INTRAVENOUS | Status: DC | PRN

## 2016-06-25 MED ORDER — DEXAMETHASONE SODIUM PHOSPHATE 10 MG/ML IJ SOLN
10.0000 mg | Freq: Two times a day (BID) | INTRAMUSCULAR | Status: DC
  Administered 2016-06-25: 10 mg via INTRAVENOUS
  Filled 2016-06-25: qty 1

## 2016-06-25 MED ORDER — ATORVASTATIN CALCIUM 20 MG PO TABS
20.0000 mg | ORAL_TABLET | Freq: Every day | ORAL | Status: DC
  Administered 2016-06-25 – 2016-06-26 (×2): 20 mg via ORAL
  Filled 2016-06-25 (×2): qty 2

## 2016-06-25 MED ORDER — PREDNISONE 20 MG PO TABS
60.0000 mg | ORAL_TABLET | Freq: Once | ORAL | Status: AC
  Administered 2016-06-25: 60 mg via ORAL
  Filled 2016-06-25: qty 3

## 2016-06-25 MED ORDER — METOPROLOL SUCCINATE ER 50 MG PO TB24
50.0000 mg | ORAL_TABLET | Freq: Every day | ORAL | Status: DC
  Administered 2016-06-26: 50 mg via ORAL
  Filled 2016-06-25: qty 2

## 2016-06-25 MED ORDER — SODIUM CHLORIDE 0.9% FLUSH
3.0000 mL | Freq: Two times a day (BID) | INTRAVENOUS | Status: DC
  Administered 2016-06-25 – 2016-06-26 (×2): 3 mL via INTRAVENOUS

## 2016-06-25 MED ORDER — ONDANSETRON HCL 4 MG/2ML IJ SOLN: 4.0000 mg | Freq: Four times a day (QID) | INTRAMUSCULAR | Status: DC | PRN

## 2016-06-25 MED ORDER — ONDANSETRON HCL 4 MG PO TABS: 4.0000 mg | ORAL_TABLET | Freq: Four times a day (QID) | ORAL | Status: DC | PRN

## 2016-06-25 MED ORDER — CYCLOBENZAPRINE HCL 10 MG PO TABS
10.0000 mg | ORAL_TABLET | Freq: Three times a day (TID) | ORAL | Status: DC | PRN
  Administered 2016-06-25 – 2016-06-26 (×3): 10 mg via ORAL
  Filled 2016-06-25 (×3): qty 1

## 2016-06-25 MED ORDER — INSULIN ASPART 100 UNIT/ML ~~LOC~~ SOLN
0.0000 [IU] | Freq: Three times a day (TID) | SUBCUTANEOUS | Status: DC
  Administered 2016-06-26 (×2): 2 [IU] via SUBCUTANEOUS

## 2016-06-25 MED ORDER — OXYCODONE HCL 5 MG PO TABS
5.0000 mg | ORAL_TABLET | ORAL | Status: DC | PRN
  Administered 2016-06-25 – 2016-06-26 (×4): 5 mg via ORAL
  Filled 2016-06-25 (×5): qty 1

## 2016-06-25 MED ORDER — SODIUM CHLORIDE 0.9 % IV SOLN
250.0000 mL | INTRAVENOUS | Status: DC | PRN
  Administered 2016-06-27 (×2): via INTRAVENOUS

## 2016-06-25 NOTE — H&P (Signed)
History and Physical    Rodney Crane [id number] DOB: Mar 17, 1951 DOA: 06/25/2016  PCP: Marton Redwood, MD  Patient coming from: home  Chief Complaint:  Back pain, leg pain, leg swelling, weakness in his arms and legs, falling a lot, instructed by dr brooks to come to ED so he can have back surgery on monday  HPI: Rodney Crane is a 66 y.o. male with medical history significant of CVA, DM, HTN, OSA on cpap comes in after a multitude of testing over the last couple of weeks for weakness in his legs and arms.  Pt reports he has seen his orthopedic surgeon dr Rolena Infante who has sent him to see neuro for EMG studies of both arms and legs which he reports were normal.  He also was evaluated by vascular surgery in the last couple of days for a cold left foot which also revealed normal vascular studies and ultrasound to r/o DVT.  Pt has swelling in his hands and legs that has been present for weeks.  He denies any sob.   No chest pain.  No fever or cough.  He denies any heart history.  He says dr brooks left a message on his answering machine yesterday telling him to come to the ED for surgery to be set up on Monday by dr brooks on his spine (recent imaging shows cervical stenosis on c3,4 do not have imaging of the rest of his spine available).  He denies urinary incontinence.  He reports numbness in only his hands and feet bilaterally.  ED called ortho on call dr Marvis Moeller who advised starting steroids and they would evaluate patient.   Review of Systems: As per HPI otherwise 10 point review of systems negative.   Past Medical History:  Diagnosis Date  . Arthritis   . Colon polyps   . CVA (cerebral infarction) 2010  . Diabetes mellitus   . Dizziness   . ED (erectile dysfunction)   . Foot drop   . H/O Bell's palsy   . Hyperlipidemia   . Hypertension   . Obesity   . OSA on CPAP   . Spinal stenosis   . Stroke ([place])   . Unsteady gait     Past Surgical History:  Procedure Laterality Date  .  CARPAL TUNNEL RELEASE     bilateral  . CERVICAL DISCECTOMY  2004  . LUMBAR DISC SURGERY     x6     reports that he has been smoking Cigarettes.  He has never used smokeless tobacco. He reports that he does not drink alcohol or use drugs.  No Known Allergies  Family History  Problem Relation Age of Onset  . Multiple sclerosis Sister   . Dementia Mother   . Cancer Father   . Sleep apnea Brother   . Hypertension Brother   . Colon cancer Neg Hx   . Stomach cancer Neg Hx     Prior to Admission medications   Medication Sig Start Date End Date Taking? Authorizing Provider  atorvastatin (LIPITOR) 40 MG tablet Take 20 mg by mouth at bedtime.  11/25/14   Historical Provider, MD  clopidogrel (PLAVIX) 75 MG tablet Take 75 mg by mouth daily. 12/04/14   Historical Provider, MD  cyclobenzaprine (FLEXERIL) 10 MG tablet TAKE 1 TABLET BY MOUTH EVERY 8 HOURS AS NEEDED FOR MUSCLE SPASMS 04/08/16   Historical Provider, MD  metFORMIN (GLUCOPHAGE) 1000 MG tablet Take 1,000 mg by mouth 2 (two) times daily with a meal.    Historical  Provider, MD  metoprolol (TOPROL XL) 50 MG 24 hr tablet Take 50 mg by mouth daily.      Historical Provider, MD  naproxen (NAPROSYN) 500 MG tablet Take 500 mg by mouth 2 (two) times daily as needed. for pain 03/09/16   Historical Provider, MD  oxyCODONE-acetaminophen (PERCOCET/ROXICET) 5-325 MG per tablet TAKE 1 TABLET BY MOUTH 3 TIMES DIALY AS NEEDED 09/26/14   Historical Provider, MD  valsartan (DIOVAN) 320 MG tablet Take 160 mg by mouth daily.  12/15/14   Historical Provider, MD    Physical Exam: Vitals:   06/25/16 1301 06/25/16 1503 06/25/16 1649  BP: 138/78 159/84 173/80  Pulse: 87 81 75  Resp: 16 16   Temp: 97.8 F (36.6 C)    TempSrc: Oral    SpO2: 95% 100% 99%  Weight: 127 kg (280 lb)    Height: 6\' 1"  (1.854 m)     Constitutional: NAD, calm, comfortable Vitals:   06/25/16 1301 06/25/16 1503 06/25/16 1649  BP: 138/78 159/84 173/80  Pulse: 87 81 75  Resp: 16 16    Temp: 97.8 F (36.6 C)    TempSrc: Oral    SpO2: 95% 100% 99%  Weight: 127 kg (280 lb)    Height: 6\' 1"  (1.854 m)     Eyes: PERRL, lids and conjunctivae normal ENMT: Mucous membranes are moist. Posterior pharynx clear of any exudate or lesions.Normal dentition.  Neck: normal, supple, no masses, no thyromegaly Respiratory: clear to auscultation bilaterally, no wheezing, no crackles. Normal respiratory effort. No accessory muscle use.  Cardiovascular: Regular rate and rhythm, no murmurs / rubs / gallops. 1+ ble extremity edema. 2+ pedal pulses. No carotid bruits.  Abdomen: no tenderness, no masses palpated. No hepatosplenomegaly. Bowel sounds positive.  Musculoskeletal: no clubbing / cyanosis. No joint deformity upper and lower extremities. Good ROM, no contractures. Normal muscle tone.  Skin: no rashes, lesions, ulcers. No induration Neurologic: CN 2-12 grossly intact. Sensation intact, DTR normal. Strength 3/5 in all rle, 4/5 lle, 5/5 bue.  Psychiatric: Normal judgment and insight. Alert and oriented x 3. Normal mood.    Labs on Admission: I have personally reviewed following labs and imaging studies  CBC:  Recent Labs Lab 06/25/16 1635  WBC 8.0  NEUTROABS 3.7  HGB 12.5*  HCT 37.6*  MCV 89.1  PLT [id number]   Basic Metabolic Panel:  Recent Labs Lab 06/25/16 1635  NA 137  K 4.4  CL 106  CO2 26  GLUCOSE 116*  BUN 19  CREATININE 1.15  CALCIUM 9.4   GFR: Estimated Creatinine Clearance: 88.2 mL/min (by C-G formula based on SCr of 1.15 mg/dL). Liver Function Tests:  Recent Labs Lab 06/25/16 1635  AST 24  ALT 27  ALKPHOS 99  BILITOT 0.8  PROT 7.3  ALBUMIN 3.7   CBG:  Recent Labs Lab 06/25/16 1313  GLUCAP 155*    Radiological Exams on Admission: Mr Brain Wo Contrast  Result Date: 06/25/2016 CLINICAL DATA:  Ongoing back pain for 1 month after fall.  Weakness. EXAM: MRI HEAD WITHOUT CONTRAST TECHNIQUE: Multiplanar, multiecho pulse sequences of the brain and  surrounding structures were obtained without intravenous contrast. COMPARISON:  None. FINDINGS: Brain: No acute infarction, hemorrhage, hydrocephalus, extra-axial collection or mass lesion. Moderate T2 hyperintensity within irregular shape in the pons, likely chronic microvascular disease in this patient with vascular risk factors. No restricted diffusion or swelling to suggest pontine demyelination or mass. Microvascular change in the cerebral white matter is mild. Chronic lacune in the  left thalamus. Vascular: Preserved major flow voids Skull and upper cervical spine: Partly seen advanced degenerative change in the upper cervical spine with probable cord flattening at C3-4. Sinuses/Orbits: No acute finding IMPRESSION: 1. No acute intracranial finding. 2. Prominent upper cervical spine degeneration with cord flattening at C3-4. Patient reportedly referred to ER by orthopedics, is there available outside cervical spine MRI? 3. Moderate signal abnormality in the pons, likely chronic microvascular ischemia in this patient with multiple vascular risk factors. Remote lacunar infarct in the left thalamus. Electronically Signed   By: Monte Fantasia M.D.   On: 06/25/2016 17:54    Assessment/Plan 66 yo male with cervical stenosis, bilateral upper and lower extremity weakness  Principal Problem:   Cervical stenosis of spinal canal- with unknown abnormalites of entire spine done by dr brooks as outpatient, start on decadron, orthopedic team has been notified and advised steroids, and admission for spine surgery.   Active Problems:   Weakness of extremity- unclear if his symptoms are explained by his mri results.  His recent EMG results are reported by him to be "normal".  Was evaluated by neuro as outpatient in the last week, dont see their note.  Obtain freq neuro exams.  Await ortho recommendations, obtain mri results done as outpatient   Essential hypertension- stable   OSA on CPAP- stable, cont cpap qhs    Diabetes mellitus ([place])- SSI   Swelling - albumin is normal.  Check bnp and cardiac echo  And cxr to evaluate for underlying chf     DVT prophylaxis:  scds Code Status:  full Family Communication: wife  Disposition Plan:  Per day team Consults called:  Ortho surgery Admission status:  admission   Kyshon,RACHAL A MD [facility]  If 7PM-7AM, please contact night-coverage [email or web address] Password [facility]  06/25/2016, 6:24 PM

## 2016-06-25 NOTE — ED Notes (Signed)
Pt returns from MRI ° °

## 2016-06-25 NOTE — ED Notes (Signed)
Melissa, RN from IV team paged and notified that patient would be moved to room 10M-5 if they could meet him up there for the IV start. Joann, RN was notified of situation and was ok with the plan

## 2016-06-25 NOTE — Progress Notes (Signed)
Patient refused CPAP due to it being uncomfortable. Patient is on room air without distress. RT will monitor as needed.

## 2016-06-25 NOTE — ED Notes (Signed)
PA at bedside.

## 2016-06-25 NOTE — ED Triage Notes (Signed)
Patient complains of ongoing back pain x 1 month after fall. Here with right leg pain and frequent falls and unable to use arms like he use to....states that he was directed to ED by [place]

## 2016-06-25 NOTE — ED Provider Notes (Signed)
[facility] DEPT Provider Note   CSN: [id number] Arrival date & time: 06/25/16  1241     History   Chief Complaint Chief Complaint  Patient presents with  . Back Pain    HPI Rodney Crane is a 66 y.o. male.  HPI Patient complains of ongoing back pain x 1 month after fall. Here with right leg pain and frequent falls and unable to use arms like he use to....states that he was directed to ED by [facility] facility, spoke with triage RN, states pt has not been seen at clinic since Feb 26th   However, pt states he was referred to vascular surgery and neurology for eval from dr brooks and they sent their evals to dr brooks, and when he received them, concerned orthopedist that he needed to come to ed for likely surgery on Monday for cervical spine stenosis? I am unable to see mri results  States he fell and hit his head and has not had head imaging, is on plavix and is now having mild headaches  However, did have full back and neck mri recently and states he was told he had narrowing of spine  Weakness is worsening in upper and lower extremities since fall  Past Medical History:  Diagnosis Date  . Arthritis   . Colon polyps   . CVA (cerebral infarction) 2010  . Diabetes mellitus   . Dizziness   . ED (erectile dysfunction)   . Foot drop   . H/O Bell's palsy   . Hyperlipidemia   . Hypertension   . Obesity   . OSA on CPAP   . Spinal stenosis   . Stroke ([place])   . Unsteady gait     Patient Active Problem List   Diagnosis Date Noted  . Cervical stenosis of spinal canal 06/25/2016  . Weakness of extremity 06/25/2016  . OSA on CPAP   . Hypertension   . Diabetes ([place])   . DYSPNEA ON EXERTION 10/06/2009  . Nonspecific (abnormal) findings on radiological and other examination of body structure 10/06/2009  . CT, CHEST, ABNORMAL 10/06/2009  . COLONIC POLYPS 10/05/2009  . DIABETES, TYPE 2 10/05/2009  . HYPERLIPIDEMIA 10/05/2009  . 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  Essential hypertension 10/05/2009  . CVA 10/05/2009  . 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 CARPAL TUNNEL RELEASE     bilateral  . CERVICAL DISCECTOMY  2004  . LUMBAR DISC SURGERY     x6       Home Medications    Prior to Admission medications   Medication Sig Start Date End Date Taking? Authorizing Provider  atorvastatin (LIPITOR) 40 MG tablet Take 20 mg by mouth at bedtime.  11/25/14  Yes Historical Provider, MD  clopidogrel (PLAVIX) 75 MG tablet Take 75 mg by mouth daily. 12/04/14  Yes Historical Provider, MD  cyclobenzaprine (FLEXERIL) 10 MG tablet TAKE 1 TABLET BY MOUTH EVERY 8 HOURS AS NEEDED FOR MUSCLE SPASMS 04/08/16  Yes Historical Provider, MD  metFORMIN (GLUCOPHAGE) 1000 MG tablet Take 1,000 mg by mouth 2 (two) times daily with a meal.   Yes Historical Provider, MD  metoprolol (TOPROL XL) 50 MG 24 hr tablet Take 50 mg by mouth daily.     Yes Historical Provider, MD  naproxen (NAPROSYN) 500 MG tablet Take 500 mg by mouth 2 (two) times daily. for pain 03/09/16  Yes Historical Provider, MD  oxyCODONE-acetaminophen (PERCOCET/ROXICET) 5-325 MG per tablet TAKE 1  TABLET BY MOUTH 3 TIMES DIALY AS NEEDED 09/26/14  Yes Historical Provider, MD  valsartan (DIOVAN) 320 MG tablet Take 160 mg by mouth daily.  12/15/14  Yes Historical Provider, MD    Family History Family History  Problem Relation Age of Onset  . Multiple sclerosis Sister   . Dementia Mother   . Cancer Father   . Sleep apnea Brother   . Hypertension Brother   . Colon cancer Neg Hx   . Stomach cancer Neg Hx     Social History Social History  Substance Use Topics  . Smoking status: Current Some Day Smoker    Types: Cigarettes    Last attempt to quit: 01/21/2011  . Smokeless tobacco: Never Used     Comment: 2-3 times weekly  . Alcohol use No     Allergies   Patient has no known allergies.   Review of Systems Review of Systems  Constitutional:  Negative for fever.  Allergic/Immunologic: Negative for immunocompromised state.  All other systems reviewed and are negative.    Physical Exam Updated Vital Signs BP 159/84 (BP Location: Right Arm)   Pulse 81   Temp 97.8 F (36.6 C) (Oral)   Resp 16   Ht 6\' 1"  (1.854 m)   Wt 127 kg   SpO2 100%   BMI 36.94 kg/m   Physical Exam  Constitutional: He appears well-developed and well-nourished. No distress.  HENT:  Head: Normocephalic and atraumatic.  Left Ear: External ear normal.  Eyes: Conjunctivae are normal. Pupils are equal, round, and reactive to light. Right eye exhibits no discharge. Left eye exhibits no discharge.  Neck: Normal range of motion. Neck supple.  Cardiovascular: Normal rate and regular rhythm.   No murmur heard. Pulmonary/Chest: Effort normal and breath sounds normal. No respiratory distress.  Abdominal: Soft. Bowel sounds are normal. He exhibits no distension and no mass. There is no tenderness. There is no rebound and no guarding.  Musculoskeletal: He exhibits edema (left lower extremity more than right).  Neurological: He is alert.  Skin: Skin is warm. He is not diaphoretic.  Psychiatric: He has a normal mood and affect.  Nursing note and vitals reviewed. LE bilateral weakness, 4+/5 UE bilateral weakness, 4-/5 on right, 4+/5 on left  ED Treatments / Results  Labs (all labs ordered are listed, but only abnormal results are displayed) Labs Reviewed  CBC WITH DIFFERENTIAL/PLATELET - Abnormal; Notable for the following:       Result Value   Hemoglobin 12.5 (*)    HCT 37.6 (*)    All other components within normal limits  COMPREHENSIVE METABOLIC PANEL - Abnormal; Notable for the following:    Glucose, Bld 116 (*)    All other components within normal limits  C-REACTIVE PROTEIN - Abnormal; Notable for the following:    CRP 1.1 (*)    All other components within normal limits  CBG MONITORING, ED - Abnormal; Notable for the following:     Glucose-Capillary 155 (*)    All other components within normal limits  SEDIMENTATION RATE  BRAIN NATRIURETIC PEPTIDE  GLUCOSE, CAPILLARY    EKG  EKG Interpretation None       Radiology Dg Chest 2 View  Result Date: 06/25/2016 CLINICAL DATA:  Swelling.  Bilateral arm and hand numbness. EXAM: CHEST  2 VIEW COMPARISON:  Chest CT 01/21/2011 FINDINGS: Mild cardiomegaly. Normal mediastinal contours. Coarse interstitial markings likely secondary to emphysema, when correlated with prior CT. No evidence of pulmonary edema. No pleural effusion. No  focal airspace disease or pneumothorax. There is degenerative change in the thoracic spine. Hardware in the cervical spine is partially included. IMPRESSION: Cardiomegaly without failure. Emphysema. Electronically Signed   By: Jeb Levering M.D.   On: 06/25/2016 21:31   Mr Brain Wo Contrast  Result Date: 06/25/2016 CLINICAL DATA:  Ongoing back pain for 1 month after fall.  Weakness. EXAM: MRI HEAD WITHOUT CONTRAST TECHNIQUE: Multiplanar, multiecho pulse sequences of the brain and surrounding structures were obtained without intravenous contrast. COMPARISON:  None. FINDINGS: Brain: No acute infarction, hemorrhage, hydrocephalus, extra-axial collection or mass lesion. Moderate T2 hyperintensity within irregular shape in the pons, likely chronic microvascular disease in this patient with vascular risk factors. No restricted diffusion or swelling to suggest pontine demyelination or mass. Microvascular change in the cerebral white matter is mild. Chronic lacune in the left thalamus. Vascular: Preserved major flow voids Skull and upper cervical spine: Partly seen advanced degenerative change in the upper cervical spine with probable cord flattening at C3-4. Sinuses/Orbits: No acute finding IMPRESSION: 1. No acute intracranial finding. 2. Prominent upper cervical spine degeneration with cord flattening at C3-4. Patient reportedly referred to ER by orthopedics, is  there available outside cervical spine MRI? 3. Moderate signal abnormality in the pons, likely chronic microvascular ischemia in this patient with multiple vascular risk factors. Remote lacunar infarct in the left thalamus. Electronically Signed   By: Monte Fantasia M.D.   On: 06/25/2016 17:54    Procedures Procedures (including critical care time)  Medications Ordered in ED Medications  metoprolol succinate (TOPROL-XL) 24 hr tablet 50 mg (not administered)  atorvastatin (LIPITOR) tablet 20 mg (20 mg Oral Given 06/25/16 2213)  sodium chloride flush (NS) 0.9 % injection 3 mL (3 mLs Intravenous Given 06/25/16 2216)  sodium chloride flush (NS) 0.9 % injection 3 mL (not administered)  0.9 %  sodium chloride infusion (not administered)  oxyCODONE (Oxy IR/ROXICODONE) immediate release tablet 5 mg (5 mg Oral Given 06/25/16 2213)  ondansetron (ZOFRAN) tablet 4 mg (not administered)    Or  ondansetron (ZOFRAN) injection 4 mg (not administered)  dexamethasone (DECADRON) injection 10 mg (10 mg Intravenous Given 06/25/16 2214)  insulin aspart (novoLOG) injection 0-9 Units (not administered)  cyclobenzaprine (FLEXERIL) tablet 10 mg (10 mg Oral Given 06/25/16 2256)  predniSONE (DELTASONE) tablet 60 mg (60 mg Oral Given 06/25/16 1834)     Initial Impression / Assessment and Plan / ED Course  I have reviewed the triage vital signs and the nursing notes.  Pertinent labs & imaging results that were available during my care of the patient were reviewed by me and considered in my medical decision making (see chart for details).     Weakness of body is diffuse, patchy Patient states recent MRI of entire back was unremarkable except for stenosis at cervical canal Patient saw a vascular surgeon and neurologist and referred to emergency department by orthopedist after negative evaluation Orthopedist believes that patient symptoms are likely secondary to cervical findings on MRI per patient Discussed with on-call  orthopedist, CT head obtained and does show consistent cervical changes with cord flattening Discussed with hospitalist for admission Orthopedist plans to see during the weekend and likely operate He will be started on steroids until further recommendations in the interim Patient and wife updated and in agreement with plan  Final Clinical Impressions(s) / ED Diagnoses   Final diagnoses:  OSA on CPAP  Essential hypertension  Diabetes mellitus of other type with complication, unspecified long term insulin use status ([place])  Swelling    New Prescriptions Current Discharge Medication List       Karma Greaser, MD 06/26/16 [id number]    Carmin Muskrat, MD 06/29/16 [phone]

## 2016-06-25 NOTE — ED Notes (Signed)
Pt returned from MRI. Wife at bedside.

## 2016-06-25 NOTE — ED Notes (Signed)
Pt to MRI at this time.  Wife remains at bedside.  Pt alert and oriented x's 3.  Pt has bil hands, arms and feet and leg swelling.

## 2016-06-26 ENCOUNTER — Inpatient Hospital Stay (HOSPITAL_COMMUNITY): Payer: Medicare Other

## 2016-06-26 LAB — GLUCOSE, CAPILLARY
GLUCOSE-CAPILLARY: 183 mg/dL — AB (ref 65–99)
GLUCOSE-CAPILLARY: 212 mg/dL — AB (ref 65–99)
Glucose-Capillary: 233 mg/dL — ABNORMAL HIGH (ref 65–99)
Glucose-Capillary: 154 mg/dL — ABNORMAL HIGH (ref 65–99)

## 2016-06-26 LAB — PLATELET FUNCTION ASSAY: COLLAGEN / EPINEPHRINE: 114 s (ref 0–193)

## 2016-06-26 LAB — ECHOCARDIOGRAM COMPLETE
Height: 73 in
Weight: 4691.39 oz

## 2016-06-26 MED ORDER — GADOBENATE DIMEGLUMINE 529 MG/ML IV SOLN
20.0000 mL | Freq: Once | INTRAVENOUS | Status: AC | PRN
  Administered 2016-06-26: 20 mL via INTRAVENOUS

## 2016-06-26 MED ORDER — CHLORHEXIDINE GLUCONATE CLOTH 2 % EX PADS
6.0000 | MEDICATED_PAD | Freq: Once | CUTANEOUS | Status: AC
  Administered 2016-06-27: 6 via TOPICAL

## 2016-06-26 MED ORDER — DEXTROSE 5 % IV SOLN
3.0000 g | INTRAVENOUS | Status: DC
  Filled 2016-06-26: qty 3000

## 2016-06-26 NOTE — Progress Notes (Signed)
PROGRESS NOTE    Rodney Crane  [id number] DOB: 1950/11/11 DOA: 06/25/2016 PCP: Marton Redwood, MD    Brief Narrative:   66 y.o. male with medical history significant of CVA, DM, HTN, OSA on cpap comes in after a multitude of testing over the last couple of weeks for weakness in his legs and arms.  Pt reports he has seen his orthopedic surgeon dr Rolena Infante who has sent him to see neuro for EMG studies of both arms and legs which he reports were normal.  Assessment & Plan:   Principal Problem:   Cervical stenosis of spinal canal - ortho on board and assisting with further work up and recommendations. -EKG ordered - of note patient is on plavix at home. I suspect he has only been off the plavix for 2 days based on my conversation with him.  Active Problems:   Essential hypertension - continue on B blocker    Weakness of extremity - suspected secondary to principle problem listed above.    OSA on CPAP    Diabetes ([place]) - Diabetic diet, SSI   DVT prophylaxis: SCD's Code Status: Full Family Communication: None at bedside Disposition Plan: pending improvement in condition   Consultants:   Orthopaedic surgery   Procedures: None   Antimicrobials: None   Subjective: Pt has no new complaints. No acute issues overnight.  Objective: Vitals:   06/25/16 2022 06/26/16 0119 06/26/16 0602 06/26/16 1013  BP: (!) 160/84 (!) 148/79 (!) 165/93 (!) 160/95  Pulse: 74 (!) 104 (!) 105 (!) 119  Resp: 16 16 18 20   Temp: 97.9 F (36.6 C) 97.9 F (36.6 C) 97.8 F (36.6 C) 97.9 F (36.6 C)  TempSrc: Oral Oral Oral Oral  SpO2: 100% 99% 100% 100%  Weight: 133 kg (293 lb 3.4 oz)     Height: 6\' 1"  (1.854 m)       Intake/Output Summary (Last 24 hours) at 06/26/16 1429 Last data filed at 06/26/16 1326  Gross per 24 hour  Intake              480 ml  Output              675 ml  Net             -195 ml   Filed Weights   06/25/16 1301 06/25/16 2022  Weight: 127 kg (280 lb) 133 kg  (293 lb 3.4 oz)    Examination:  General exam: Appears calm and comfortable, in nad. Respiratory system: Clear to auscultation. Respiratory effort normal. Cardiovascular system: S1 & S2 heard, RRR. No JVD, murmurs, rubs, Gastrointestinal system: Abdomen is nondistended, soft and nontender. No organomegaly or masses felt.  Central nervous system: Alert and oriented. No facial asymmetry  Extremities: no cyanosis Skin: No rashes, lesions or ulcers, on limited exam. Psychiatry: Judgement and insight appear normal. Mood & affect appropriate.     Data Reviewed: I have personally reviewed following labs and imaging studies  CBC:  Recent Labs Lab 06/25/16 1635  WBC 8.0  NEUTROABS 3.7  HGB 12.5*  HCT 37.6*  MCV 89.1  PLT [id number]   Basic Metabolic Panel:  Recent Labs Lab 06/25/16 1635  NA 137  K 4.4  CL 106  CO2 26  GLUCOSE 116*  BUN 19  CREATININE 1.15  CALCIUM 9.4   GFR: Estimated Creatinine Clearance: 90.4 mL/min (by C-G formula based on SCr of 1.15 mg/dL). Liver Function Tests:  Recent Labs Lab 06/25/16 1635  AST 24  ALT  27  ALKPHOS 99  BILITOT 0.8  PROT 7.3  ALBUMIN 3.7   No results for input(s): LIPASE, AMYLASE in the last 168 hours. No results for input(s): AMMONIA in the last 168 hours. Coagulation Profile: No results for input(s): INR, PROTIME in the last 168 hours. Cardiac Enzymes: No results for input(s): CKTOTAL, CKMB, CKMBINDEX, TROPONINI in the last 168 hours. BNP (last 3 results) No results for input(s): PROBNP in the last 8760 hours. HbA1C: No results for input(s): HGBA1C in the last 72 hours. CBG:  Recent Labs Lab 06/25/16 1313 06/25/16 2034 06/26/16 1001  GLUCAP 155* 96 233*   Lipid Profile: No results for input(s): CHOL, HDL, LDLCALC, TRIG, CHOLHDL, LDLDIRECT in the last 72 hours. Thyroid Function Tests: No results for input(s): TSH, T4TOTAL, FREET4, T3FREE, THYROIDAB in the last 72 hours. Anemia Panel: No results for input(s):  VITAMINB12, FOLATE, FERRITIN, TIBC, IRON, RETICCTPCT in the last 72 hours. Sepsis Labs: No results for input(s): PROCALCITON, LATICACIDVEN in the last 168 hours.  No results found for this or any previous visit (from the past 240 hour(s)).   Radiology Studies: Dg Chest 2 View  Result Date: 06/25/2016 CLINICAL DATA:  Swelling.  Bilateral arm and hand numbness. EXAM: CHEST  2 VIEW COMPARISON:  Chest CT 01/21/2011 FINDINGS: Mild cardiomegaly. Normal mediastinal contours. Coarse interstitial markings likely secondary to emphysema, when correlated with prior CT. No evidence of pulmonary edema. No pleural effusion. No focal airspace disease or pneumothorax. There is degenerative change in the thoracic spine. Hardware in the cervical spine is partially included. IMPRESSION: Cardiomegaly without failure. Emphysema. Electronically Signed   By: Jeb Levering M.D.   On: 06/25/2016 21:31   Ct Cervical Spine Wo Contrast  Result Date: 06/26/2016 CLINICAL DATA:  Myelopathy. Weakness in the arms and legs. Pre-surgical planning for cervical decompression. EXAM: CT CERVICAL SPINE WITHOUT CONTRAST TECHNIQUE: Multidetector CT imaging of the cervical spine was performed without intravenous contrast. Multiplanar CT image reconstructions were also generated. COMPARISON:  Brain MRI 06/25/2016 FINDINGS: Alignment: Trace retrolisthesis of C2 on C3. Skull base and vertebrae: Prior C4-C7 ACDF with solid osseous fusion at each level. Anterior plate and screws in place without surrounding lucency. No evidence of acute fracture or destructive osseous process. Soft tissues and spinal canal: No prevertebral fluid or swelling. No visible canal hematoma. Upper chest: Minimal pleural-parenchymal scarring in the lung apices. Other: None. Disc levels: Mild ligamentous thickening and calcification posterior to the dens without evidence of significant canal stenosis at this level. The cervical spinal canal is small in caliber diffusely on a  congenital basis. C2-3: Disc bulging/uncovering and uncovertebral spurring result in suspected moderate spinal stenosis and moderate left neural foraminal stenosis. C3-4: Central disc protrusion with suspected severe spinal stenosis and cord flattening as seen on the recent brain MRI. Mild right and moderate left facet arthrosis result in moderate left neural foraminal stenosis. C4-5: Prior ACDF. 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 No significant osseous neural foraminal stenosis. C5-6: Prior ACDF. Posterior longitudinal ligament ossification with potential mild-to-moderate residual congenital and acquired spinal stenosis. No significant osseous neural foraminal stenosis. C6-7: Prior ACDF. Diffuse osteophytic ridging with mild osseous neural foraminal stenosis bilaterally. C7-T1: Severe disc space narrowing. Diffuse spurring results in moderate to severe bilateral neural foraminal stenosis. Spinal canal not well evaluated. T1-2: Mild disc space narrowing. Mild spurring and facet arthrosis result in moderate bilateral neural foraminal stenosis. Spinal canal not well evaluated. T2-3: Mild disc space narrowing.  Evidence of prior right laminotomy. Moderately bulky ligamentum flavum ossification with at least mild spinal stenosis and moderate bilateral neural foraminal stenosis. IMPRESSION: 1. Solid C4-C7 ACDF. 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 2. Suspected central disc protrusion at C3-4 with likely severe spinal stenosis and cord flattening as mentioned on recent brain MRI. Moderate left foraminal stenosis. 3. Potential moderate spinal stenosis at C2-3. 4. Moderate to severe neural foraminal stenosis at C7-T1 and in the upper thoracic spine. Electronically Signed   By: Logan Bores M.D.   On: 06/26/2016 12:10   Mr Brain Wo Contrast  Result Date:  06/25/2016 CLINICAL DATA:  Ongoing back pain for 1 month after fall.  Weakness. EXAM: MRI HEAD WITHOUT CONTRAST TECHNIQUE: Multiplanar, multiecho pulse sequences of the brain and surrounding structures were obtained without intravenous contrast. COMPARISON:  None. FINDINGS: Brain: No acute infarction, hemorrhage, hydrocephalus, extra-axial collection or mass lesion. Moderate T2 hyperintensity within irregular shape in the pons, likely chronic microvascular disease in this patient with vascular risk factors. No restricted diffusion or swelling to suggest pontine demyelination or mass. Microvascular change in the cerebral white matter is mild. Chronic lacune in the left thalamus. Vascular: Preserved major flow voids Skull and upper cervical spine: Partly seen advanced degenerative change in the upper cervical spine with probable cord flattening at C3-4. Sinuses/Orbits: No acute finding IMPRESSION: 1. No acute intracranial finding. 2. Prominent upper cervical spine degeneration with cord flattening at C3-4. Patient reportedly referred to ER by orthopedics, is there available outside cervical spine MRI? 3. Moderate signal abnormality in the pons, likely chronic microvascular ischemia in this patient with multiple vascular risk factors. Remote lacunar infarct in the left thalamus. Electronically Signed   By: Monte Fantasia M.D.   On: 06/25/2016 17:54    Scheduled Meds: . atorvastatin  20 mg Oral QHS  . insulin aspart  0-9 Units Subcutaneous TID WC  . metoprolol succinate  50 mg Oral Daily  . sodium chloride flush  3 mL Intravenous Q12H   Continuous Infusions:   LOS: 1 day    Time spent: > 35 minutes  Velvet Bathe, MD [facility] Pager [phone]  If 7PM-7AM, please contact night-coverage [email or web address] Password [email or web address] 06/26/2016, 2:29 PM

## 2016-06-26 NOTE — Consult Note (Signed)
Full note dictated Job number: [id number]  Briefly:  OR tomorrow for decompression and fusion Teds/SCDs for DVT prevention EKG pending Medical clearance today

## 2016-06-26 NOTE — Consult Note (Signed)
Rodney Crane, Rodney Crane NO.:  [id number]  MEDICAL RECORD NO.:  [id number]  LOCATION:  5M05C                        FACILITY:  [facility]  PHYSICIAN:  Geneve Kimpel D. Rolena Infante, M.D. DATE OF BIRTH:  February 11, 1951  DATE OF CONSULTATION:  06/26/2016 DATE OF DISCHARGE:                                CONSULTATION   HISTORY OF PRESENT ILLNESS:  Rodney Crane is a very pleasant gentleman, who has been well known to me.  He was admitted at my request for further workup and management of a central cord syndrome.  Rodney Crane is a pleasant 66 year old gentleman with multiple medical problems as well as previous cervical and lumbar spinal surgery.  Initially presented to me in January with significant back, buttock and bilateral leg pain.  At that time, we discussed a spinal cord stimulator. Ultimately in early February, he fell and then started noting increasing difficulty ambulating.  Upon his return to see me in late February, he had a dramatic upper and lower extremity weakness.  He was no longer ambulating and had frequent falls.  An MRI at that time showed significant spinal stenosis at the adjacent C3-4 level as well as the C2- 3.  At that point, I got a stat neurological outpatient consult to do nerve conduction test to ensure that there was no other etiology for his rapid advance decline.  He ultimately saw Dr. Fletcher Anon, who did a nerve conduction test, which did confirm the diabetic neuropathy.  He also was seen by Dr. Scot Dock for Vascular Surgery because of lower extremity swelling.  DVT was ruled out and there was not felt to be any significant vascular issue.  Because of the significant weakness and the unremarkable workup today, I called him on Friday, June 24, 2016, I do have him come to the emergency room to be admitted.  He ultimately came Saturday afternoon and on sitting and down again today.  His past medical, surgical, family, social history have been documented in the hospitalist's  admission note.  Please refer to it for specifics.  CLINICAL EXAM:  He is a pleasant gentleman, who appears as stated age in no acute severe distress with some neck and low back pain.  He has excellent neck range of motion with no difficulty.  He has bilateral upper and lower extremity weakness of about 3/5.  This is all muscle groups tested.  He has multiple trigger point fingers.  He also has significant dysesthesias in all extremities.  On equivocal Hoffman, reflexes were all diminished to 1+ and symmetrical in the upper and lower extremities.  He is unable to stand to support his own weight.  He has very limited range of motion of both upper extremities, the right side seems to be slightly more affected than the left.  No shortness of breath or chest pain.  Abdomen is soft, nontender.  He has no incontinence of bowel and bladder.  At this point in time, the cervical MRI, which was done in my office demonstrates significant spinal stenosis at the adjacent levels.  His brain MRI, which was done yesterday showed no acute intracranial issue such as stroke.  The patient notes, but there is notation of the stenosis  at C3-4.  PLAN:  At this point, I do think the patient has essentially a central cord injury as a result of his fall and has had a decline in his neurological exam.  I did have a frank and long discussion with the patient and his wife.  Because he has lost the ability to ambulate, the likelihood of improvement is low.  However, without surgical debridement, the chances are next to zero.  At this point, the best thing that I could offer is a posterior instrumented fusion from C2 down to C4-C5 and then to decompress the posterior cervical spine with a laminectomy at C3 and a laminotomy of C2.  This would require intraoperative neuromonitoring.  In addition, probably a week later, I would then return to the OR to plan on doing the lumbar decompression for his tandem spinal  stenosis.  I have reviewed the risks with the patient and his wife, which include infection, bleeding, nerve damage, death, stroke, paralysis, no improvement in his neurological exam or deterioration in his neurological exam, ongoing or worse pain, need for further surgery.  All other questions were encouraged and addressed.  We will plan on surgery Monday morning.  In addition, we will wean off the steroids as it is unlikely to assist with the spinal cord injury and it will probably cause more issues with his diabetes. We will use TEDs, SCDs for DVT prophylaxis.     Macy Polio D. Rolena Infante, M.D.     DDB/MEDQ  D:  06/26/2016  T:  06/26/2016  Job:  [id number]

## 2016-06-26 NOTE — Procedures (Signed)
Full Name: Rodney Crane Gender: Male MRN #:   [id number] Date of Birth: [birth year]    Visit Date: 06/22/2016 10:33 Age: 66 Years 21 Months Old Examining Physician: Sarina Ill, MD  Referring Physician: Dr. 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 Patient fell in early February and hurt his neck.  He fell down, bumped his head and fell into a wall. Since then he has not been able to button his shirts, can't tie shoes, daily progressive worsening, he can't walk with a walker. He can't grip anything. He has lost all his strength. Both arms are involved and both legs, He can barely make a fist and hold utencils. No problems swallowing or speaking. No weight loss. No atrophy. He has swelling in the lower left leg over the last few weeks in the setting of inactivity(sending him to pcp today). His hands are swollen as well. He can't open a jar or open his bottles of medicine. He is falling all the time. MRI of the cervical spine showed higher cervical central canal stenosis.  Progressive. He has chronic right foot numbness and the left foot feels cold.   He endorses worse symptoms in the hands and a chronic right foot drop.  Summary:   The right Radial sensory nerve showed delayed latency(3.47ms,N<2.9) and reduced amplitude (7uV, N>15). The right Sural sensory nerve showed delayed latency(4.21ms,N<4.4) and reduced amplitude (4.9uV, N>6). The right Superficial Peroneal sensory nerve showed delayed latency(4.65ms,N<4.4) and reduced amplitude (2.7uV, N>6). The right Median sensory nerve showed delayed latency(4.48ms,N<3.4) and reduced amplitude (5.5uV, N>10). The right Ulnar sensory nerve(Dig V) showed no response and reduced amplitude (mid palm,  2.9uV, N>11). The right Median motor nerve showed reduced amplitude(2.75mV, N>4). The right Ulnar motor nerve showed decreased velocity(b-elbow-wrist, 32m/s,N>49) and  decreased velocity(a-elbow-b-elbow, 75m/s,N>49)  and a 34m/s (N<10) drop across the elbow segment.  The right Peroneal motor nerve showed no response. The right Tibial motor nerve showed delayed latency (6.1 ms, N<5.8) and reduced amplitude(1.71mV, N>4) and decreased velocity (pop-fossa-ankle, 36m/s, N>41). The right Ulnar F wave showed delayed latency (40.43ms, N<32), the right Tibial F Wave showed no response. Patient could not activate multiple lower extremity muscles due to weakness (see chart below). The right Tibialis Anterior and Abductor Hallucis muscles showed increased spontaneous activity (+1 PSW).The right Biceps Brachii showed reduced motor unit recruitment and the right Pronator Teres showed giant motor unit amplitude and reduced motor unit recruitment   Conclusion: There is electrodiagnostic evidence of chronic length-dependent sensorimotor axonal neuropathy. No suggestion of myopathy/myositis. 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 The Tibialis Anterior showed acute/ongoing denervation which could be due to radiculopathy or neuropathy. I would have expected more demyelinating-rage changes by now on NCV if the etiology of his weakness were due to demyelinating disease (such as Acute Inflammatory Demyelinating Polyneuropathy). Given history of worsening weakness after fall and neck injury, likely severe cervical stenosis as cause for his symptoms.   Sarina Ill M.D.  [facility] [place], [place] [address] Tel: [phone] Fax: [phone]        [facility]    Nerve / Sites Rec. Site Peak Lat Ref. Amp.1-2 Ref. Distance    ms ms V V  cm  R Radial - Anatomical snuff box (Forearm)     Forearm Wrist 3.02 ?2.90 7.0 ?15.0 10  R Sural -  Ankle (Calf)     Calf Ankle 4.58 ?4.40 4.9 ?6.0 14  R Superficial peroneal - Ankle     Lat leg Ankle 4.64 ?4.40 2.7 ?6.0 14  R Median - Orthodromic (Dig II, Mid palm)     Dig II Wrist 4.17 ?3.40 5.5 ?10.0 13  R Ulnar - Orthodromic, (Dig V, Mid palm)     Dig V Wrist NR ?3.10 NR ?5.0 11     Mid palm Wrist 2.19 ?2.30 2.9 ?11.0 8     MNC    Nerve / Sites Muscle Latency Ref. Amplitude Ref. Rel Amp Segments Distance Lat Diff Velocity Ref. Area    ms ms mV mV %  cm ms m/s m/s mVms  R Median - APB     Wrist APB 4.2 ?4.4 2.6 ?4.0 100 Wrist - APB 7    13.5     Upper arm APB 10.8  3.0  112 Upper arm - Wrist 28 6.6 43  14.1  R Ulnar - ADM     Wrist ADM 3.3 ?3.3 6.9 ?6.0 100 Wrist - ADM 7    27.7     B.Elbow ADM 8.0  6.0  86.8 B.Elbow - Wrist 22 4.7 46 ?49 26.9     A.Elbow ADM 12.1  5.2  86.6 A.Elbow - B.Elbow 12 4.1 30 ?49 24.3         A.Elbow - Wrist  8.8     R Peroneal - EDB     Ankle EDB NR ?6.5 NR ?2.0 NR Ankle - EDB 9    NR     Fib head EDB NR  NR  NR Fib head - Ankle  NR  ?44 NR         Pop fossa - Ankle  NR     R Tibial - AH     Ankle AH 6.1 ?5.8 1.5 ?4.0 100 Ankle - AH 9    3.9     Pop fossa AH 16.9  0.6  40.3 Pop fossa - Ankle 40 10.7 37 ?41 2.5     F  Wave    Nerve F Lat Ref.   ms ms  R Ulnar - ADM 40.8 ?32.0  R Tibial - AH NR ?56.0     EMG full        EMG Summary Table    Spontaneous MUAP Recruitment  Muscle IA Fib PSW Fasc Other Amp Dur. Poly Pattern  R. Vastus medialis Normal None None None Unable to activate due to Weakness _______ _______ _______ _______  R. Abductor hallucis Decreased due to atrophy None +1 None Unable to activate due to Weakness _______ ______ _______ _______  R. Biceps brachii Normal None None None _______ Normal Normal Normal Reduced  R. Biceps femoris (long head) Normal None None None _______ Normal Normal Normal Normal  R. Deltoid Normal None None None _______ Normal Normal Normal Normal  R. Extensor hallucis longus Normal None None None  Unable to activate due to Weakness _______ ______ _______ _______  R. First dorsal interosseous Normal None None None _______ Normal Normal Normal Normal  R. Flexor digitorum profundus (Ulnar) Normal None None None _______ Normal Normal Normal Normal  R. Gastrocnemius (Medial head) Normal None None None Unable to activate due to Weakness _______ ______ _______ _______  R. Gluteus maximus Normal None None None _______  Normal Normal Normal Normal  R. Iliopsoas Normal None None None Unable to activate due to Weakness _______ _______ _______ _______  R. Opponens pollicis Normal None None None _______ Normal Normal Normal Normal  R. Pronator teres Normal None None None Unable to activate due to Weakness Giant ______ _______ Reduced  R. Thoracic paraspinals (low) Normal None None None _______ Normal Normal Normal Normal  R. Tibialis anterior Normal None +1 None Unable to activate due to Weakness _______ _______ _______ _______  R. Triceps brachii Normal None None None _______ Normal Normal Normal Normal  R. Vastus lateralis Normal None None None Unable to activate due to Weakness _______ _______ _______ _______

## 2016-06-26 NOTE — Anesthesia Preprocedure Evaluation (Addendum)
Anesthesia Evaluation  Patient identified by MRN, date of birth, ID band Patient awake    Reviewed: Allergy & Precautions, NPO status , Patient's Chart, lab work & pertinent test results, reviewed documented beta blocker date and time   Airway Mallampati: III  TM Distance: >3 FB Neck ROM: Limited    Dental no notable dental hx.    Pulmonary sleep apnea , Current Smoker,    Pulmonary exam normal breath sounds clear to auscultation       Cardiovascular hypertension, Pt. on medications and Pt. on home beta blockers + Peripheral Vascular Disease  Normal cardiovascular exam Rhythm:Regular Rate:Normal     Neuro/Psych negative neurological ROS  negative psych ROS   GI/Hepatic negative GI ROS, Neg liver ROS,   Endo/Other  diabetes, Type 2, Oral Hypoglycemic Agents  Renal/GU negative Renal ROS     Musculoskeletal  (+) Arthritis ,   Abdominal   Peds  Hematology negative hematology ROS (+)   Anesthesia Other Findings   Reproductive/Obstetrics                            Anesthesia Physical Anesthesia Plan  ASA: III  Anesthesia Plan: General   Post-op Pain Management:    Induction: Intravenous  Airway Management Planned: Oral ETT  Additional Equipment: Arterial line  Intra-op Plan:   Post-operative Plan: Extubation in OR  Informed Consent: I have reviewed the patients History and Physical, chart, labs and discussed the procedure including the risks, benefits and alternatives for the proposed anesthesia with the patient or authorized representative who has indicated his/her understanding and acceptance.   Dental advisory given  Plan Discussed with: CRNA  Anesthesia Plan Comments:        Anesthesia Quick Evaluation                                   Anesthesia Evaluation    Airway        Dental   Pulmonary           Cardiovascular      Neuro/Psych    GI/Hepatic   Endo/Other    Renal/GU      Musculoskeletal   Abdominal   Peds  Hematology   Anesthesia Other Findings   Reproductive/Obstetrics                             Anesthesia Physical Anesthesia Plan  ASA: III  Anesthesia Plan: General   Post-op Pain Management:    Induction: Intravenous  Airway Management Planned: Oral ETT  Additional Equipment:   Intra-op Plan:   Post-operative Plan: Extubation in OR  Informed Consent: I have reviewed the patients History and Physical, chart, labs and discussed the procedure including the risks, benefits and alternatives for the proposed anesthesia with the patient or authorized representative who has indicated his/her understanding and acceptance.   Dental advisory given  Plan Discussed with: CRNA  Anesthesia Plan Comments:         Anesthesia Quick Evaluation

## 2016-06-26 NOTE — Progress Notes (Signed)
Patient scheduled for cervical surgery on 06/27/2016 at 0730 per Dr. Rolena Infante. 3 g Ancef IV on-call to OR ordered. ICF reviewed and confirmed by patient and signed by patients wife per patient request. Patient and patients wife v/u. ICF placed in patient chart and CHG bath completed at 0530. RN will continue to monitor.

## 2016-06-26 NOTE — H&P (Signed)
NAMELONAN, WESTENSKOW NO.:  [id number]  MEDICAL RECORD NO.:  [id number]  LOCATION:  5M05C                        FACILITY:  [facility]  PHYSICIAN:  Livi Mcgann D. Rolena Infante, M.D. DATE OF BIRTH:  1950/10/15  DATE OF ADMISSION:  06/25/2016 DATE OF DISCHARGE:                             HISTORY & PHYSICAL   PREOPERATIVE DIAGNOSIS:  Central cord syndrome.  HISTORY OF PRESENT ILLNESS:  This is a very pleasant 66 year old gentleman, who is well-known to me.  Medical history includes previous stroke, diabetes, and hypertension.  He has had both cervical and lumbar surgery in the past with an anterior C4 to 7 fusion with posterior procedure as well.  He presented to me initially in early January with complaints of significant back pain and leg pain consistent with spinal stenosis and neurogenic claudication.  Ultimately, because of his previous surgery he desired not to have further spinal surgery.  We discussed the potential of a spinal cord stimulator.  His next visit with me was in mid February.  At that time, he noted that he fell on February 3rd and since that time had a dramatic decrease in his overall ability.  He is no longer able to walk without significant assistance.  He was having multiple falls following that initial one and he was having increasing pain.  At that point, we got a MRI of his low back, which demonstrated a severe spinal stenosis at 3-4, 4-5.  However, because of the ongoing.  DICTATION ENDS here.     Evalette Montrose D. Rolena Infante, M.D.     DDB/MEDQ  D:  06/26/2016  T:  06/26/2016  Job:  [id number]

## 2016-06-26 NOTE — Progress Notes (Signed)
Echocardiogram 2D Echocardiogram has been performed.  Rodney Crane 06/26/2016, 9:42 AM

## 2016-06-27 ENCOUNTER — Inpatient Hospital Stay (HOSPITAL_COMMUNITY): Payer: Medicare Other | Admitting: Anesthesiology

## 2016-06-27 ENCOUNTER — Inpatient Hospital Stay (HOSPITAL_COMMUNITY): Payer: Medicare Other

## 2016-06-27 ENCOUNTER — Encounter (HOSPITAL_COMMUNITY)
Admission: EM | Disposition: A | Discharge status: Rehabilitation Facility | Payer: Self-pay | Source: Home / Self Care | Attending: Family Medicine

## 2016-06-27 DIAGNOSIS — Z9989 Dependence on other enabling machines and devices: Secondary | ICD-10-CM

## 2016-06-27 DIAGNOSIS — I10 Essential (primary) hypertension: Secondary | ICD-10-CM

## 2016-06-27 DIAGNOSIS — M4802 Spinal stenosis, cervical region: Secondary | ICD-10-CM | POA: Diagnosis present

## 2016-06-27 DIAGNOSIS — E138 Other specified diabetes mellitus with unspecified complications: Secondary | ICD-10-CM

## 2016-06-27 DIAGNOSIS — G4733 Obstructive sleep apnea (adult) (pediatric): Secondary | ICD-10-CM

## 2016-06-27 HISTORY — PX: POSTERIOR CERVICAL FUSION/FORAMINOTOMY: SHX5038

## 2016-06-27 LAB — POCT I-STAT 7, (LYTES, BLD GAS, ICA,H+H)
ACID-BASE DEFICIT: 3 mmol/L — AB (ref 0.0–2.0)
ACID-BASE EXCESS: 2 mmol/L (ref 0.0–2.0)
BICARBONATE: 26.9 mmol/L (ref 20.0–28.0)
Bicarbonate: 21.9 mmol/L (ref 20.0–28.0)
CALCIUM ION: 1.16 mmol/L (ref 1.15–1.40)
CALCIUM ION: 1.21 mmol/L (ref 1.15–1.40)
HCT: 29 % — ABNORMAL LOW (ref 39.0–52.0)
HCT: 19 % — ABNORMAL LOW (ref 39.0–52.0)
Hemoglobin: 9.9 g/dL — ABNORMAL LOW (ref 13.0–17.0)
Hemoglobin: 6.5 g/dL — CL (ref 13.0–17.0)
O2 SAT: 100 %
O2 Saturation: 100 %
PH ART: 7.384 (ref 7.350–7.450)
PH ART: 7.458 — AB (ref 7.350–7.450)
PO2 ART: 194 mmHg — AB (ref 83.0–108.0)
Patient temperature: 35.2
Potassium: 3.8 mmol/L (ref 3.5–5.1)
Potassium: 4.7 mmol/L (ref 3.5–5.1)
SODIUM: 139 mmol/L (ref 135–145)
SODIUM: 136 mmol/L (ref 135–145)
TCO2: 23 mmol/L (ref 0–100)
TCO2: 28 mmol/L (ref 0–100)
pCO2 arterial: 35.8 mmHg (ref 32.0–48.0)
pCO2 arterial: 37.3 mmHg (ref 32.0–48.0)
pO2, Arterial: 205 mmHg — ABNORMAL HIGH (ref 83.0–108.0)

## 2016-06-27 LAB — GLUCOSE, CAPILLARY
GLUCOSE-CAPILLARY: 159 mg/dL — AB (ref 65–99)
Glucose-Capillary: 129 mg/dL — ABNORMAL HIGH (ref 65–99)
Glucose-Capillary: 168 mg/dL — ABNORMAL HIGH (ref 65–99)
Glucose-Capillary: 167 mg/dL — ABNORMAL HIGH (ref 65–99)

## 2016-06-27 LAB — BLOOD GAS, ARTERIAL
ACID-BASE DEFICIT: 2.5 mmol/L — AB (ref 0.0–2.0)
BICARBONATE: 22 mmol/L (ref 20.0–28.0)
FIO2: 50
MECHVT: 650 mL
O2 SAT: 99.2 %
PATIENT TEMPERATURE: 97.8
PEEP/CPAP: 5 cmH2O
PO2 ART: 183 mmHg — AB (ref 83.0–108.0)
RATE: 12 resp/min
pCO2 arterial: 38.1 mmHg (ref 32.0–48.0)
pH, Arterial: 7.377 (ref 7.350–7.450)

## 2016-06-27 LAB — MRSA PCR SCREENING: MRSA BY PCR: NEGATIVE

## 2016-06-27 LAB — POCT I-STAT 4, (NA,K, GLUC, HGB,HCT)
Glucose, Bld: 179 mg/dL — ABNORMAL HIGH (ref 65–99)
HCT: 26 % — ABNORMAL LOW (ref 39.0–52.0)
Hemoglobin: 8.8 g/dL — ABNORMAL LOW (ref 13.0–17.0)
Potassium: 4.3 mmol/L (ref 3.5–5.1)
Sodium: 138 mmol/L (ref 135–145)

## 2016-06-27 LAB — TRIGLYCERIDES: Triglycerides: 100 mg/dL (ref <150)

## 2016-06-27 LAB — PREPARE RBC (CROSSMATCH)

## 2016-06-27 LAB — ABO/RH: ABO/RH(D): B POS

## 2016-06-27 SURGERY — POSTERIOR CERVICAL FUSION/FORAMINOTOMY LEVEL 3
Anesthesia: General

## 2016-06-27 MED ORDER — MIDAZOLAM HCL 2 MG/2ML IJ SOLN: 1.0000 mg | INTRAMUSCULAR | Status: DC | PRN

## 2016-06-27 MED ORDER — SUCCINYLCHOLINE CHLORIDE 200 MG/10ML IV SOSY
PREFILLED_SYRINGE | INTRAVENOUS | Status: AC
  Filled 2016-06-27: qty 10

## 2016-06-27 MED ORDER — VANCOMYCIN HCL 500 MG IV SOLR
INTRAVENOUS | Status: DC | PRN
  Administered 2016-06-27: 500 mg

## 2016-06-27 MED ORDER — DEXMEDETOMIDINE HCL IN NACL 200 MCG/50ML IV SOLN
INTRAVENOUS | Status: DC | PRN
  Administered 2016-06-27: 0.7 ug/kg/h via INTRAVENOUS

## 2016-06-27 MED ORDER — ORAL CARE MOUTH RINSE: 15.0000 mL | Freq: Four times a day (QID) | OROMUCOSAL | Status: DC

## 2016-06-27 MED ORDER — DEXAMETHASONE SODIUM PHOSPHATE 10 MG/ML IJ SOLN
INTRAMUSCULAR | Status: AC
  Filled 2016-06-27: qty 1

## 2016-06-27 MED ORDER — NOREPINEPHRINE BITARTRATE 1 MG/ML IV SOLN
0.0000 ug/min | INTRAVENOUS | Status: DC
  Filled 2016-06-27: qty 4

## 2016-06-27 MED ORDER — THROMBIN 20000 UNITS EX SOLR
CUTANEOUS | Status: AC
  Filled 2016-06-27: qty 20000

## 2016-06-27 MED ORDER — OXYCODONE HCL 5 MG/5ML PO SOLN: 5.0000 mg | Freq: Once | ORAL | Status: DC | PRN

## 2016-06-27 MED ORDER — MENTHOL 3 MG MT LOZG: 1.0000 | LOZENGE | OROMUCOSAL | Status: DC | PRN

## 2016-06-27 MED ORDER — FENTANYL 2500MCG IN NS 250ML (10MCG/ML) PREMIX INFUSION
25.0000 ug/h | INTRAVENOUS | Status: DC
  Administered 2016-06-27: 200 ug/h via INTRAVENOUS
  Filled 2016-06-27: qty 250

## 2016-06-27 MED ORDER — SUCCINYLCHOLINE CHLORIDE 20 MG/ML IJ SOLN
INTRAMUSCULAR | Status: DC | PRN
  Administered 2016-06-27: 100 mg via INTRAVENOUS

## 2016-06-27 MED ORDER — INSULIN ASPART 100 UNIT/ML ~~LOC~~ SOLN: 0.0000 [IU] | Freq: Every day | SUBCUTANEOUS | Status: DC

## 2016-06-27 MED ORDER — ONDANSETRON HCL 4 MG/2ML IJ SOLN: 4.0000 mg | Freq: Four times a day (QID) | INTRAMUSCULAR | Status: DC | PRN

## 2016-06-27 MED ORDER — DOUBLE ANTIBIOTIC 500-10000 UNIT/GM EX OINT
TOPICAL_OINTMENT | CUTANEOUS | Status: AC
  Filled 2016-06-27: qty 1

## 2016-06-27 MED ORDER — ALBUMIN HUMAN 5 % IV SOLN: INTRAVENOUS | Status: DC | PRN

## 2016-06-27 MED ORDER — LIDOCAINE HCL (CARDIAC) 20 MG/ML IV SOLN
INTRAVENOUS | Status: DC | PRN
  Administered 2016-06-27: 100 mg via INTRAVENOUS

## 2016-06-27 MED ORDER — FENTANYL CITRATE (PF) 100 MCG/2ML IJ SOLN
INTRAMUSCULAR | Status: DC | PRN
  Administered 2016-06-27 (×5): 50 ug via INTRAVENOUS
  Administered 2016-06-27 (×2): 100 ug via INTRAVENOUS
  Administered 2016-06-27 (×3): 50 ug via INTRAVENOUS

## 2016-06-27 MED ORDER — EPHEDRINE SULFATE 50 MG/ML IJ SOLN
INTRAMUSCULAR | Status: DC | PRN
  Administered 2016-06-27 (×3): 5 mg via INTRAVENOUS

## 2016-06-27 MED ORDER — VASOPRESSIN 20 UNIT/ML IV SOLN
INTRAVENOUS | Status: DC | PRN
  Administered 2016-06-27 (×3): 1 [IU] via INTRAVENOUS
  Administered 2016-06-27 (×3): .5 [IU] via INTRAVENOUS
  Administered 2016-06-27: 1 [IU] via INTRAVENOUS
  Administered 2016-06-27: 1.5 [IU] via INTRAVENOUS

## 2016-06-27 MED ORDER — VASOPRESSIN 20 UNIT/ML IV SOLN
INTRAVENOUS | Status: AC
  Filled 2016-06-27: qty 1

## 2016-06-27 MED ORDER — MIDAZOLAM HCL 5 MG/5ML IJ SOLN
INTRAMUSCULAR | Status: DC | PRN
  Administered 2016-06-27: 1 mg via INTRAVENOUS

## 2016-06-27 MED ORDER — PROPOFOL 10 MG/ML IV BOLUS
INTRAVENOUS | Status: AC
  Filled 2016-06-27: qty 40

## 2016-06-27 MED ORDER — NOREPINEPHRINE BITARTRATE 1 MG/ML IV SOLN
INTRAVENOUS | Status: DC | PRN
  Administered 2016-06-27: 3 ug/min via INTRAVENOUS

## 2016-06-27 MED ORDER — LACTATED RINGERS IV SOLN
INTRAVENOUS | Status: DC
  Administered 2016-06-27 – 2016-06-30 (×5): via INTRAVENOUS

## 2016-06-27 MED ORDER — DEXAMETHASONE SODIUM PHOSPHATE 10 MG/ML IJ SOLN
INTRAMUSCULAR | Status: DC | PRN
  Administered 2016-06-27: 10 mg via INTRAVENOUS

## 2016-06-27 MED ORDER — DEXTROSE 5 % IV SOLN
500.0000 mg | Freq: Four times a day (QID) | INTRAVENOUS | Status: DC | PRN
  Administered 2016-06-29 – 2016-06-30 (×2): 500 mg via INTRAVENOUS
  Filled 2016-06-27 (×5): qty 5

## 2016-06-27 MED ORDER — PROPOFOL 1000 MG/100ML IV EMUL
0.0000 ug/kg/min | INTRAVENOUS | Status: DC
  Administered 2016-06-27: 30 ug/kg/min via INTRAVENOUS
  Administered 2016-06-27: 50 ug/kg/min via INTRAVENOUS
  Administered 2016-06-28 (×2): 30 ug/kg/min via INTRAVENOUS
  Filled 2016-06-27 (×4): qty 100

## 2016-06-27 MED ORDER — CEFAZOLIN IN D5W 1 GM/50ML IV SOLN
1.0000 g | Freq: Three times a day (TID) | INTRAVENOUS | Status: AC
  Administered 2016-06-27 – 2016-06-28 (×2): 1 g via INTRAVENOUS
  Filled 2016-06-27 (×2): qty 50

## 2016-06-27 MED ORDER — PHENOL 1.4 % MT LIQD: 1.0000 | OROMUCOSAL | Status: DC | PRN

## 2016-06-27 MED ORDER — INSULIN ASPART 100 UNIT/ML ~~LOC~~ SOLN
0.0000 [IU] | SUBCUTANEOUS | Status: DC
  Administered 2016-06-27 (×2): 4 [IU] via SUBCUTANEOUS
  Administered 2016-06-28: 3 [IU] via SUBCUTANEOUS
  Administered 2016-06-28: 4 [IU] via SUBCUTANEOUS
  Administered 2016-06-28: 3 [IU] via SUBCUTANEOUS
  Administered 2016-06-29: 4 [IU] via SUBCUTANEOUS
  Administered 2016-06-29 (×4): 3 [IU] via SUBCUTANEOUS

## 2016-06-27 MED ORDER — GLYCOPYRROLATE 0.2 MG/ML IJ SOLN
INTRAMUSCULAR | Status: DC | PRN
  Administered 2016-06-27: 0.2 mg via INTRAVENOUS

## 2016-06-27 MED ORDER — SODIUM CHLORIDE 0.9% FLUSH: 3.0000 mL | INTRAVENOUS | Status: DC | PRN

## 2016-06-27 MED ORDER — PANTOPRAZOLE SODIUM 40 MG IV SOLR
40.0000 mg | INTRAVENOUS | Status: DC
Start: 2016-06-27 — End: 2016-06-30
  Administered 2016-06-27 – 2016-06-29 (×3): 40 mg via INTRAVENOUS
  Filled 2016-06-27 (×3): qty 40

## 2016-06-27 MED ORDER — PROMETHAZINE HCL 25 MG/ML IJ SOLN: 6.2500 mg | INTRAMUSCULAR | Status: DC | PRN

## 2016-06-27 MED ORDER — PROPOFOL 500 MG/50ML IV EMUL
INTRAVENOUS | Status: DC | PRN
  Administered 2016-06-27: 50 ug/kg/min via INTRAVENOUS

## 2016-06-27 MED ORDER — PHENYLEPHRINE 40 MCG/ML (10ML) SYRINGE FOR IV PUSH (FOR BLOOD PRESSURE SUPPORT)
PREFILLED_SYRINGE | INTRAVENOUS | Status: AC
  Filled 2016-06-27: qty 10

## 2016-06-27 MED ORDER — LABETALOL HCL 5 MG/ML IV SOLN
10.0000 mg | INTRAVENOUS | Status: DC | PRN
  Administered 2016-06-29 – 2016-06-30 (×2): 10 mg via INTRAVENOUS
  Filled 2016-06-27 (×2): qty 4

## 2016-06-27 MED ORDER — LIDOCAINE 2% (20 MG/ML) 5 ML SYRINGE
INTRAMUSCULAR | Status: AC
  Filled 2016-06-27: qty 5

## 2016-06-27 MED ORDER — VANCOMYCIN HCL 500 MG IV SOLR
INTRAVENOUS | Status: AC
  Filled 2016-06-27: qty 500

## 2016-06-27 MED ORDER — BUPIVACAINE-EPINEPHRINE 0.25% -1:200000 IJ SOLN
INTRAMUSCULAR | Status: DC | PRN
  Administered 2016-06-27: 15 mL

## 2016-06-27 MED ORDER — PHENYLEPHRINE HCL 10 MG/ML IJ SOLN
INTRAMUSCULAR | Status: DC | PRN
  Administered 2016-06-27 (×3): 80 ug via INTRAVENOUS

## 2016-06-27 MED ORDER — ONDANSETRON HCL 4 MG PO TABS: 4.0000 mg | ORAL_TABLET | Freq: Four times a day (QID) | ORAL | Status: DC | PRN

## 2016-06-27 MED ORDER — SODIUM CHLORIDE 0.9 % IV SOLN
INTRAVENOUS | Status: DC | PRN
  Administered 2016-06-27: 50 ug/min via INTRAVENOUS

## 2016-06-27 MED ORDER — LACTATED RINGERS IV SOLN
INTRAVENOUS | Status: DC | PRN
  Administered 2016-06-27 (×5): via INTRAVENOUS

## 2016-06-27 MED ORDER — ARTIFICIAL TEARS OP OINT
TOPICAL_OINTMENT | OPHTHALMIC | Status: DC | PRN
  Administered 2016-06-27: 1 via OPHTHALMIC

## 2016-06-27 MED ORDER — EPINEPHRINE PF 1 MG/ML IJ SOLN
INTRAMUSCULAR | Status: AC
  Filled 2016-06-27: qty 1

## 2016-06-27 MED ORDER — DEXMEDETOMIDINE HCL IN NACL 200 MCG/50ML IV SOLN
0.4000 ug/kg/h | INTRAVENOUS | Status: DC
  Filled 2016-06-27: qty 100

## 2016-06-27 MED ORDER — SODIUM CHLORIDE 0.9 % IV SOLN: 250.0000 mL | INTRAVENOUS | Status: DC

## 2016-06-27 MED ORDER — FENTANYL BOLUS VIA INFUSION
25.0000 ug | INTRAVENOUS | Status: DC | PRN
  Filled 2016-06-27: qty 25

## 2016-06-27 MED ORDER — METOPROLOL TARTRATE 5 MG/5ML IV SOLN
INTRAVENOUS | Status: DC | PRN
  Administered 2016-06-27 (×2): 1 mg via INTRAVENOUS

## 2016-06-27 MED ORDER — METOPROLOL TARTRATE 5 MG/5ML IV SOLN
INTRAVENOUS | Status: AC
  Filled 2016-06-27: qty 5

## 2016-06-27 MED ORDER — ARTIFICIAL TEARS OP OINT
TOPICAL_OINTMENT | OPHTHALMIC | Status: AC
  Filled 2016-06-27: qty 3.5

## 2016-06-27 MED ORDER — DEXTROSE 5 % IV SOLN
INTRAVENOUS | Status: DC | PRN
  Administered 2016-06-27 (×2): 3 g via INTRAVENOUS

## 2016-06-27 MED ORDER — PROPOFOL 10 MG/ML IV BOLUS
INTRAVENOUS | Status: AC
  Filled 2016-06-27: qty 20

## 2016-06-27 MED ORDER — CEFAZOLIN SODIUM 1 G IJ SOLR
INTRAMUSCULAR | Status: AC
  Filled 2016-06-27: qty 30

## 2016-06-27 MED ORDER — PROPOFOL 10 MG/ML IV BOLUS
INTRAVENOUS | Status: DC | PRN
  Administered 2016-06-27: 100 mg via INTRAVENOUS
  Administered 2016-06-27 (×3): 50 mg via INTRAVENOUS

## 2016-06-27 MED ORDER — EPHEDRINE 5 MG/ML INJ
INTRAVENOUS | Status: AC
  Filled 2016-06-27: qty 10

## 2016-06-27 MED ORDER — HYDROMORPHONE HCL 1 MG/ML IJ SOLN: 0.2500 mg | INTRAMUSCULAR | Status: DC | PRN

## 2016-06-27 MED ORDER — FENTANYL CITRATE (PF) 100 MCG/2ML IJ SOLN
INTRAMUSCULAR | Status: AC
  Filled 2016-06-27: qty 10

## 2016-06-27 MED ORDER — SODIUM CHLORIDE 0.9% FLUSH
3.0000 mL | Freq: Two times a day (BID) | INTRAVENOUS | Status: DC
  Administered 2016-06-27 – 2016-06-28 (×2): 10 mL via INTRAVENOUS
  Administered 2016-06-29 – 2016-07-04 (×10): 3 mL via INTRAVENOUS

## 2016-06-27 MED ORDER — METHOCARBAMOL 500 MG PO TABS
500.0000 mg | ORAL_TABLET | Freq: Four times a day (QID) | ORAL | Status: DC | PRN
  Administered 2016-06-29 – 2016-07-03 (×4): 500 mg via ORAL
  Filled 2016-06-27 (×4): qty 1

## 2016-06-27 MED ORDER — BUPIVACAINE HCL (PF) 0.25 % IJ SOLN
INTRAMUSCULAR | Status: AC
  Filled 2016-06-27: qty 30

## 2016-06-27 MED ORDER — MIDAZOLAM HCL 2 MG/2ML IJ SOLN
INTRAMUSCULAR | Status: AC
  Filled 2016-06-27: qty 2

## 2016-06-27 MED ORDER — 0.9 % SODIUM CHLORIDE (POUR BTL) OPTIME
TOPICAL | Status: DC | PRN
  Administered 2016-06-27: 1000 mL

## 2016-06-27 MED ORDER — VECURONIUM BROMIDE 10 MG IV SOLR
INTRAVENOUS | Status: DC | PRN
  Administered 2016-06-27: 10 mg via INTRAVENOUS

## 2016-06-27 MED ORDER — NOREPINEPHRINE BITARTRATE 1 MG/ML IV SOLN: 0.0000 ug/min | INTRAVENOUS | Status: DC

## 2016-06-27 MED ORDER — BUPIVACAINE-EPINEPHRINE (PF) 0.5% -1:200000 IJ SOLN
INTRAMUSCULAR | Status: AC
  Filled 2016-06-27: qty 30

## 2016-06-27 MED ORDER — VECURONIUM BROMIDE 10 MG IV SOLR
INTRAVENOUS | Status: AC
  Filled 2016-06-27: qty 10

## 2016-06-27 MED ORDER — OXYCODONE HCL 5 MG PO TABS: 5.0000 mg | ORAL_TABLET | Freq: Once | ORAL | Status: DC | PRN

## 2016-06-27 MED ORDER — THROMBIN 20000 UNITS EX KIT
PACK | CUTANEOUS | Status: DC | PRN
  Administered 2016-06-27: 20000 [IU] via TOPICAL

## 2016-06-27 MED ORDER — FENTANYL CITRATE (PF) 100 MCG/2ML IJ SOLN
INTRAMUSCULAR | Status: AC
  Filled 2016-06-27: qty 2

## 2016-06-27 MED ORDER — ALBUMIN HUMAN 5 % IV SOLN
INTRAVENOUS | Status: DC | PRN
  Administered 2016-06-27: 10:00:00 via INTRAVENOUS

## 2016-06-27 MED ORDER — INSULIN ASPART 100 UNIT/ML ~~LOC~~ SOLN: 0.0000 [IU] | Freq: Three times a day (TID) | SUBCUTANEOUS | Status: DC

## 2016-06-27 MED ORDER — ORAL CARE MOUTH RINSE
15.0000 mL | OROMUCOSAL | Status: DC
  Administered 2016-06-27 – 2016-06-28 (×7): 15 mL via OROMUCOSAL

## 2016-06-27 MED ORDER — MEPERIDINE HCL 25 MG/ML IJ SOLN: 6.2500 mg | INTRAMUSCULAR | Status: DC | PRN

## 2016-06-27 MED ORDER — SODIUM CHLORIDE 0.9 % IV SOLN: Freq: Once | INTRAVENOUS | Status: DC

## 2016-06-27 MED ORDER — CHLORHEXIDINE GLUCONATE 0.12% ORAL RINSE (MEDLINE KIT)
15.0000 mL | Freq: Two times a day (BID) | OROMUCOSAL | Status: DC
  Administered 2016-06-27 – 2016-06-28 (×2): 15 mL via OROMUCOSAL

## 2016-06-27 SURGICAL SUPPLY — 83 items
BIT DRILL MOUNTAINEER FIX 14 (BIT) ×2
BIT DRILL MOUNTAINEER FIX 14MM (BIT) ×2 IMPLANT
BLADE CLIPPER SURG (BLADE) ×3 IMPLANT
BONE VIVIGEN FORMABLE 1.3CC (Bone Implant) ×6 IMPLANT
BUR MATCHSTICK NEURO 3.0 LAGG (BURR) ×3 IMPLANT
CLOSURE WOUND 1/2 X4 (GAUZE/BANDAGES/DRESSINGS)
CONNECTOR MTN HEAD TO HEAD 35M (Orthopedic Implant) ×3 IMPLANT
CORDS BIPOLAR (ELECTRODE) ×3 IMPLANT
COVER MAYO STAND STRL (DRAPES) ×6 IMPLANT
COVER SURGICAL LIGHT HANDLE (MISCELLANEOUS) ×3 IMPLANT
DERMABOND ADVANCED (GAUZE/BANDAGES/DRESSINGS)
DERMABOND ADVANCED .7 DNX12 (GAUZE/BANDAGES/DRESSINGS) IMPLANT
DRAPE C-ARM 42X72 X-RAY (DRAPES) ×6 IMPLANT
DRAPE POUCH INSTRU U-SHP 10X18 (DRAPES) ×3 IMPLANT
DRAPE SURG 17X23 STRL (DRAPES) ×9 IMPLANT
DRAPE U-SHAPE 47X51 STRL (DRAPES) ×3 IMPLANT
DRILL BIT MOUNTAINEER FIX 14MM (BIT) ×4
DRSG ADAPTIC 3X8 NADH LF (GAUZE/BANDAGES/DRESSINGS) ×3 IMPLANT
DRSG MEPILEX BORDER 4X8 (GAUZE/BANDAGES/DRESSINGS) IMPLANT
DURAPREP 26ML APPLICATOR (WOUND CARE) ×3 IMPLANT
ELECT BLADE 4.0 EZ CLEAN MEGAD (MISCELLANEOUS)
ELECT BLADE 6.5 EXT (BLADE) ×3 IMPLANT
ELECT CAUTERY BLADE 6.4 (BLADE) ×3 IMPLANT
ELECT PENCIL ROCKER SW 15FT (MISCELLANEOUS) ×3 IMPLANT
ELECT REM PT RETURN 9FT ADLT (ELECTROSURGICAL) ×3
ELECTRODE BLDE 4.0 EZ CLN MEGD (MISCELLANEOUS) IMPLANT
ELECTRODE REM PT RTRN 9FT ADLT (ELECTROSURGICAL) ×1 IMPLANT
EVACUATOR 1/8 PVC DRAIN (DRAIN) ×3 IMPLANT
GAUZE SPONGE 4X4 12PLY STRL (GAUZE/BANDAGES/DRESSINGS) ×3 IMPLANT
GLOVE BIO SURGEON STRL SZ 6.5 (GLOVE) ×2 IMPLANT
GLOVE BIO SURGEONS STRL SZ 6.5 (GLOVE) ×1
GLOVE BIOGEL PI IND STRL 6.5 (GLOVE) ×1 IMPLANT
GLOVE BIOGEL PI IND STRL 8.5 (GLOVE) ×1 IMPLANT
GLOVE BIOGEL PI INDICATOR 6.5 (GLOVE) ×2
GLOVE BIOGEL PI INDICATOR 8.5 (GLOVE) ×2
GLOVE SS BIOGEL STRL SZ 8.5 (GLOVE) ×1 IMPLANT
GLOVE SUPERSENSE BIOGEL SZ 8.5 (GLOVE) ×2
GOWN STRL REUS W/ TWL LRG LVL3 (GOWN DISPOSABLE) ×1 IMPLANT
GOWN STRL REUS W/TWL 2XL LVL3 (GOWN DISPOSABLE) ×6 IMPLANT
GOWN STRL REUS W/TWL LRG LVL3 (GOWN DISPOSABLE) ×2
IV CATH 14GX2 1/4 (CATHETERS) IMPLANT
KIT BASIN OR (CUSTOM PROCEDURE TRAY) ×3 IMPLANT
KIT POSITION SURG JACKSON T1 (MISCELLANEOUS) ×3 IMPLANT
KIT ROOM TURNOVER OR (KITS) ×3 IMPLANT
NEEDLE 22X1 1/2 (OR ONLY) (NEEDLE) ×3 IMPLANT
NEURO MONITORING STIM (LABOR (TRAVEL & OVERTIME)) ×3 IMPLANT
NS IRRIG 1000ML POUR BTL (IV SOLUTION) ×3 IMPLANT
NUT HH X CONN OUTER (Orthopedic Implant) ×6 IMPLANT
PACK LAMINECTOMY ORTHO (CUSTOM PROCEDURE TRAY) ×3 IMPLANT
PACK UNIVERSAL I (CUSTOM PROCEDURE TRAY) ×3 IMPLANT
PAD ARMBOARD 7.5X6 YLW CONV (MISCELLANEOUS) ×6 IMPLANT
PATTIES SURGICAL .5 X.5 (GAUZE/BANDAGES/DRESSINGS) IMPLANT
PATTIES SURGICAL .5 X1 (DISPOSABLE) ×3 IMPLANT
PUTTY BONE DBX 2.5 MIS (Bone Implant) ×3 IMPLANT
ROD MOUNTAINEER 300MM (Rod) ×3 IMPLANT
ROD TEMPLATE MOUNTAINEER 120MM (ROD) ×3 IMPLANT
SCREW F A 3.5X14 (Screw) ×30 IMPLANT
SCREW HH X CONN INNER (Screw) ×6 IMPLANT
SCREW INNER (Screw) ×24 IMPLANT
SPONGE LAP 4X18 X RAY DECT (DISPOSABLE) ×21 IMPLANT
SPONGE SURGIFOAM ABS GEL 100 (HEMOSTASIS) ×3 IMPLANT
STAPLER VISISTAT 35W (STAPLE) ×3 IMPLANT
STRIP CLOSURE SKIN 1/2X4 (GAUZE/BANDAGES/DRESSINGS) IMPLANT
SURGIFLO W/THROMBIN 8M KIT (HEMOSTASIS) ×15 IMPLANT
SURGILUBE 2OZ TUBE FLIPTOP (MISCELLANEOUS) IMPLANT
SUT BONE WAX W31G (SUTURE) ×3 IMPLANT
SUT ETHILON 3 0 FSL (SUTURE) ×3 IMPLANT
SUT PDS AB 1 CTX 36 (SUTURE) ×3 IMPLANT
SUT PROLENE 0 CT (SUTURE) ×3 IMPLANT
SUT PROLENE 2 0 CT2 30 (SUTURE) ×3 IMPLANT
SUT VIC AB 0 CTB1 27 (SUTURE) ×6 IMPLANT
SUT VIC AB 1 CTX 18 (SUTURE) ×3 IMPLANT
SUT VIC AB 1 CTX 36 (SUTURE) ×4
SUT VIC AB 1 CTX36XBRD ANBCTR (SUTURE) ×2 IMPLANT
SUT VIC AB 2-0 CT1 18 (SUTURE) ×3 IMPLANT
SYR BULB IRRIGATION 50ML (SYRINGE) ×3 IMPLANT
SYR CONTROL 10ML LL (SYRINGE) ×3 IMPLANT
TEMPLATE ROD MOUNTAINEER (INSTRUMENTS) ×3 IMPLANT
TOWEL OR 17X24 6PK STRL BLUE (TOWEL DISPOSABLE) ×3 IMPLANT
TOWEL OR 17X26 10 PK STRL BLUE (TOWEL DISPOSABLE) ×3 IMPLANT
TRAY FOLEY W/METER SILVER 16FR (SET/KITS/TRAYS/PACK) ×3 IMPLANT
WATER STERILE IRR 1000ML POUR (IV SOLUTION) ×3 IMPLANT
YANKAUER SUCT BULB TIP NO VENT (SUCTIONS) ×3 IMPLANT

## 2016-06-27 NOTE — Progress Notes (Signed)
eLink Physician-Brief Progress Note Patient Name: Rodney Crane DOB: [birth year] MRN: [id number]   Date of Service  06/27/2016  HPI/Events of Note  In bed, calm vitls wnl On vent S/p post cerv surgery  eICU Interventions  Will need abg on vent, pcxr pccm to assess now at bedside Keep on vent overnight Consider decadron     Intervention Category Evaluation Type: New Patient Evaluation  Raylene Miyamoto. 06/27/2016, 4:43 PM

## 2016-06-27 NOTE — Anesthesia Procedure Notes (Signed)
Procedure Name: Intubation Date/Time: 06/27/2016 10:14 AM Performed by: Jacquiline Doe A Pre-anesthesia Checklist: Patient identified, Emergency Drugs available, Suction available and Patient being monitored Patient Re-evaluated:Patient Re-evaluated prior to inductionOxygen Delivery Method: Circle System Utilized and Circle system utilized Preoxygenation: Pre-oxygenation with 100% oxygen Intubation Type: IV induction and Cricoid Pressure applied Ventilation: Mask ventilation without difficulty and Oral airway inserted - appropriate to patient size Laryngoscope Size: Mac, 4 and Glidescope Grade View: Grade I Tube type: Oral Tube size: 8.0 mm Number of attempts: 1 Airway Equipment and Method: Video-laryngoscopy and Rigid stylet Placement Confirmation: ETT inserted through vocal cords under direct vision,  positive ETCO2 and breath sounds checked- equal and bilateral Secured at: 23 cm Tube secured with: Tape Dental Injury: Teeth and Oropharynx as per pre-operative assessment  Difficulty Due To: Difficulty was anticipated, Difficult Airway- due to reduced neck mobility and Difficult Airway-  due to neck instability

## 2016-06-27 NOTE — Progress Notes (Signed)
    Subjective: Procedure(s) (LRB): POSTERIOR CERVICAL FUSION C2-C5; C2-C4 Decompression (N/A) Day of Surgery  Patient reports pain as 2 on 0-10 scale.  Reports unchanged arm pain Denies neck pain   Positive void Negative bowel movement Positive flatus Negative chest pain or shortness of breath  Objective: Vital signs in last 24 hours: Temp:  [97.5 F (36.4 C)-98.1 F (36.7 C)] 97.5 F (36.4 C) (03/05 0504) Pulse Rate:  [70-119] 72 (03/05 0504) Resp:  [20] 20 (03/05 0504) BP: (142-174)/(78-126) 174/84 (03/05 0504) SpO2:  [100 %] 100 % (03/05 0504) Weight:  [132.7 kg (292 lb 8.8 oz)] 132.7 kg (292 lb 8.8 oz) (03/05 0500)  Intake/Output from previous day: 03/04 0701 - 03/05 0700 In: 723 [P.O.:720; I.V.:3] Out: 600 [Urine:600]  Labs:  Recent Labs  06/25/16 1635  WBC 8.0  RBC 4.22  HCT 37.6*  PLT 307    Recent Labs  06/25/16 1635  NA 137  K 4.4  CL 106  CO2 26  BUN 19  CREATININE 1.15  GLUCOSE 116*  CALCIUM 9.4   No results for input(s): LABPT, INR in the last 72 hours.  Physical Exam: ABD soft Intact pulses distally Compartment soft no change in neurologic exam  Assessment/Plan: Patient stable  MRI results noted and discussed with radiologist Plan on decompression and fusion today  Melina Schools, MD [place] [phone]

## 2016-06-27 NOTE — Transfer of Care (Signed)
Immediate Anesthesia Transfer of Care Note  Patient: Rodney Crane  Procedure(s) Performed: Procedure(s): POSTERIOR CERVICAL FUSION C2-C5; C2-C4 Decompression (N/A)  Patient Location: NICU  Anesthesia Type:General  Level of Consciousness: sedated and Patient remains intubated per anesthesia plan  Airway & Oxygen Therapy: Patient remains intubated per anesthesia plan and Patient placed on Ventilator (see vital sign flow sheet for setting)  Post-op Assessment: Report given to RN and Post -op Vital signs reviewed and stable  Post vital signs: Reviewed and stable  Last Vitals:  Vitals:   06/27/16 0109 06/27/16 0504  BP: (!) 157/90 (!) 174/84  Pulse: 70 72  Resp: 20 20  Temp: 36.7 C 36.4 C    Last Pain:  Vitals:   06/27/16 0504  TempSrc: Oral  PainSc:       Patients Stated Pain Goal: 5 ([id number])  Complications: No apparent anesthesia complications

## 2016-06-27 NOTE — Consult Note (Signed)
           [facility] CM Primary Care Navigator  06/27/2016  Rodney Crane 12-Jan-1951 [id number]   Went to see patientat the bedside to identify possible discharge needsbut staff reports that patient was transferred to 53M 02 (Neuro ICU) earlier after his surgery(Posterior Cervical Fusion, Decompression).  Will attempt to meet with patient at another time, when transferred out of ICU.  For questions, please contact:  Dannielle Huh, BSN, RN- [facility] Primary Care Navigator  Telephone: [phone] [place]

## 2016-06-27 NOTE — Progress Notes (Signed)
Pt on vent after surgery. Critical care team monitoring. Will reassess next am if patient comes off vent. Defer medical management to critical care team while patient is intubated.  Rodney Crane, [employer]

## 2016-06-27 NOTE — Op Note (Signed)
NAMEAMADEUS, OYAMA NO.:  [id number]  MEDICAL RECORD NO.:  [id number]  LOCATION:  5M05C                        FACILITY:  [facility]  PHYSICIAN:  Paxon Propes D. Rolena Infante, M.D. DATE OF BIRTH:  1950-05-16  DATE OF PROCEDURE:  06/27/2016 DATE OF DISCHARGE:                              OPERATIVE REPORT   PREOPERATIVE DIAGNOSIS:  Cervical spondylitic myelopathy (central cord syndrome).  POSTOPERATIVE DIAGNOSIS:  Cervical spondylitic myelopathy (central cord syndrome).  OPERATIVE PROCEDURES: 1. Posterior instrumented fusion, C2-C6. 2. Posterior decompression, C2-C5.  COMPLICATIONS:  None.  CONDITION:  Stable.  IMPLANT SYSTEM USED:  DePuy Mountaineer lateral mass screws with 14-mm favored angle lateral mass screws placed at C3, C4, C5 and C6 with a 14- mm pars interarticularis screw placed at C2.  Neuromonitoring was performed throughout the case and there was actual improvement and an evoked motor and sensory potentials at the conclusion of the case.  HISTORY:  This is a very pleasant 66 year old gentleman who presented to my office in late January or early February with low back pain.  He was initially diagnosed with lumbar spinal stenosis.  Subsequently, he fell and at that point, noted significant bilateral upper extremity and lower extremity weakness and progressive difficulty walking.  Ultimately when he returned to see me, he was using a wheelchair and he could not stand and support himself.  An urgent workup was done and ultimately, he was admitted and taken to the operating room for surgical management of his spinal stenosis.  All appropriate risks, benefits and alternatives were discussed with the patient and consent was obtained.  OPERATIVE NOTE:  The patient was brought to the operating room and placed supine on the operating table.  After successful induction of general anesthesia and endotracheal intubation, TEDs, SCDs and a Foley were inserted.  The  Mayfield tongs were then secured to the head in a standard fashion.  The spine Glennon Mac frame was then attached to the patient and he was gently rotated into the prone position.  The neck was properly positioned and fastened in place and the arms were secured at the side.  Once this was completed, the posterior cervical spine was prepped and draped in a standard fashion.  Time-out was taken confirming patient, procedure and all pertinent important data.  It was at this point that the neuromonitor rep indicated that she was on her way and it was felt to proceed with the approach.  The midline incision was infiltrated with 0.25% Marcaine and a midline incision was made.  Sharp dissection was carried out down to the deep fascia.  Care was taken to remain in the midline as this was the avascular plane.  I then incised and exposed the C2, 3, 4, 5, 6 and 7 spinous processes.  Using Bovie and Cobb, I stripped the paraspinal muscles to expose the lamina and facet complex.  Once I had the entire posterior aspect of the cervical spine exposed, the neuromonitoring rep arrived.  She then inserted all of the needles.  Her initial response was very poor sensory and absent motors.  This was consistent with the severity of his spinal stenosis and his preoperative neurological deficit.  At this point,  with the approach done, I then identified and used a standard technique for inserting the lateral mass screws.  I traced the line that in the midportion of the facet directly over the lateral mass of C3, I used my awl to breach the cortex and then a high-speed bur. The drill was angled, in line with about 30 degrees, angled cephalad and about 15 degrees angled lateral.  Essentially, this followed the course of the lateral mass.  Using live fluoro in the lateral plane, I advanced the drill down 14 mm into the lateral mass of C3.  I then probed, tapped and then probed again.  I repeated this exact procedure  at C4, C5 and C6 and on the contralateral side.  Once all the lateral masses were then drilled and tapped, I then positioned my drill for the C2 pars screw.  I used my awl in the appropriate starting spot, breached the cortex and then advanced the drill aiming in line tangential to the C2-3 facet and proceeding about 5 degrees aiming medial.  This based on his preoperative CT would allow me to avoid the foramen transversarium. Once I had the drill, I then tapped and probed.  At this point, I then repeated this on the contralateral side.  At this point, all levels were drilled.  It was at this point, I proceeded with the decompression.  I identified the junction of where the lamina met the facet and then using a high-speed bur, I removed the lamina.  Once I was nearing the near cortex of the lamina, I gently probed.  Once I was through, I then used my 1-mm Kerrison to complete the laminectomy of C3 and C4.  Once I had one side done, I went to the contralateral side and again using the high- speed bur, created a trough and cut through the lamina.  At this point, I then used a rongeur to gently lift the posterior elements off en bloc. Once this was done, the cervical spine was completely evaluated, it was now completely free.  Using my 1 and 2-mm Kerrison punch, I did undercut the lamina of C2.  At this point, I irrigated copiously with normal saline.  I had completed the central decompression from C2-C5.  I then gently and very carefully placed all of my screws in the holes that were already drilled.  Once all screws were properly locked down, I then made sure I used my high-speed bur to decorticate the facet complex of each level and then packed bone graft in the posterior lateral aspect.  I then contoured two rods, secured them to the screws and then locked them in place.  A single crosslinking unit was placed at C3.  All screws were then torqued off according to manufacture's standard.   Final x-rays were satisfactory.  I was very pleased with the overall location of the hardware and the decompression.  I irrigated copiously with normal saline and used a bipolar electrocautery and FloSeal to obtain and maintain hemostasis.  Two drains were placed, one on the left, one on the right to prevent hematoma formation and brought out through separate stab incisions and through the back.  At this point, final neuromonitoring, SSEPs an evoked motors were done and there was actual improvement from the baseline.  I was very pleased with this.  At this point, I closed the deep fascia with interrupted #1 Vicryl sutures.  Then, I did a layer of interrupted 2-0 Vicryl sutures and then I did  a running vertical mattress suture with #1 PDS for the skin.  Adaptic impregnated with Neosporin was then applied as was a bulky dry dressing.  The drain sites were also dressed.  At this point, with the dressings intact, the patient was then turned back prone onto an ICU bed.  Given the duration of procedure and his other medical comorbidities, we elected to keep him intubated overnight.  The patient was transferred to the neuro ICU.  There were no abstaining complicating features from the surgical standpoint.     Rhyanna Sorce D. Rolena Infante, M.D.     DDB/MEDQ  D:  06/27/2016  T:  06/27/2016  Job:  [id number]

## 2016-06-27 NOTE — Brief Op Note (Signed)
06/25/2016 - 06/27/2016  4:12 PM  PATIENT:  [name]  66 y.o. male  PRE-OPERATIVE DIAGNOSIS:  Cervial Stenosis  POST-OPERATIVE DIAGNOSIS:  Cervial Stenosis  PROCEDURE:  Procedure(s): POSTERIOR CERVICAL FUSION C2-C5; C2-C4 Decompression (N/A)  SURGEON:  Surgeon(s) and Role:    * Melina Schools, MD - Primary  PHYSICIAN ASSISTANT:   ASSISTANTS: none   ANESTHESIA:   general  EBL:  Total I/O In: [id number] [I.V.:4900; Blood:670; IV Piggyback:750] Out: 1000 [Urine:650; Blood:350]  BLOOD ADMINISTERED:none  DRAINS: 2 drains posterior cervical regin   LOCAL MEDICATIONS USED:  MARCAINE     SPECIMEN:  No Specimen  DISPOSITION OF SPECIMEN:  N/A  COUNTS:  YES  TOURNIQUET:  * No tourniquets in log *  DICTATION: .Other Dictation: Dictation Number [id number]  PLAN OF CARE: Admit to inpatient   PATIENT DISPOSITION:  ICU - intubated and hemodynamically stable.

## 2016-06-27 NOTE — Consult Note (Signed)
PCCM Consult Note  Admission date: 06/25/2016 Consult date: 06/27/2016 Referring provider: Dr. Rolena Infante, Ortho  CC: Back pain  HPI: Hx from chart and medical team.  66 yo male smoker had fall about 1 month prior to admission and had persistent back pain.  He also had difficulty moving his arms.  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  He  has a past medical history of Arthritis; Colon polyps; CVA (cerebral infarction) (2010); Diabetes mellitus; Dizziness; ED (erectile dysfunction); Foot drop; H/O Bell's palsy; Hyperlipidemia; Hypertension; Obesity; OSA on CPAP; Spinal stenosis; Stroke ([place]); and Unsteady gait.  He  has a past surgical history that includes Lumbar disc surgery; Cervical discectomy (2004); and Carpal tunnel release.  His family history includes Cancer in his father; Dementia in his mother; Hypertension in his brother; Multiple sclerosis in his sister; Sleep apnea in his brother. There is no history of Colon cancer or Stomach cancer.  He  reports that he has been smoking Cigarettes.  He has never used smokeless tobacco. He reports that he does not drink alcohol or use drugs.  No Known Allergies  No current facility-administered medications on file prior to encounter.    Current Outpatient Prescriptions on File Prior to Encounter  Medication Sig  . atorvastatin (LIPITOR) 40 MG tablet Take 20 mg by mouth at bedtime.   . clopidogrel (PLAVIX) 75 MG tablet Take 75 mg by mouth daily.  . cyclobenzaprine (FLEXERIL) 10 MG tablet TAKE 1 TABLET BY MOUTH EVERY 8 HOURS AS NEEDED FOR MUSCLE SPASMS  . metFORMIN (GLUCOPHAGE) 1000 MG tablet Take 1,000 mg by mouth 2 (two) times daily with a meal.  . metoprolol (TOPROL XL) 50 MG 24 hr tablet Take 50 mg by mouth daily.    . naproxen (NAPROSYN) 500 MG tablet Take 500 mg by mouth 2 (two) times daily. for pain  . oxyCODONE-acetaminophen (PERCOCET/ROXICET) 5-325 MG per tablet TAKE 1 TABLET BY MOUTH 3 TIMES DIALY AS NEEDED  . valsartan (DIOVAN) 320 MG  tablet Take 160 mg by mouth daily.     ROS: Unable to obtain  Vital signs: BP (!) 174/84 (BP Location: Left Arm)   Pulse 72   Temp 97.5 F (36.4 C) (Oral)   Resp 20   Ht 6\' 1"  (1.854 m)   Wt 292 lb 8.8 oz (132.7 kg)   SpO2 100%   BMI 38.60 kg/m   Intake/output: I/O last 3 completed shifts: In: 723 [P.O.:720; I.V.:3] Out: 1275 [Urine:1275]  General: in ICU Neuro: sedated HEENT: pupils pinpoint, ETT in place, Aspen collar on Cardiac: regular, no murmur Chest: no wheeze Abd: soft, non tender Ext: no edema Skin: no rashes   CMP Latest Ref Rng & Units 06/25/2016  Glucose 65 - 99 mg/dL 116(H)  BUN 6 - 20 mg/dL 19  Creatinine 0.61 - 1.24 mg/dL 1.15  Sodium 135 - 145 mmol/L 137  Potassium 3.5 - 5.1 mmol/L 4.4  Chloride 101 - 111 mmol/L 106  CO2 22 - 32 mmol/L 26  Calcium 8.9 - 10.3 mg/dL 9.4  Total Protein 6.5 - 8.1 g/dL 7.3  Total Bilirubin 0.3 - 1.2 mg/dL 0.8  Alkaline Phos 38 - 126 U/L 99  AST 15 - 41 U/L 24  ALT 17 - 63 U/L 27     CBC Latest Ref Rng & Units 06/25/2016  WBC 4.0 - 10.5 K/uL 8.0  Hemoglobin 13.0 - 17.0 g/dL 12.5(L)  Hematocrit 39.0 - 52.0 % 37.6(L)  Platelets 150 - 400 K/uL 307  ABG    Component Value Date/Time   HCO3 27.0 (H) 09/22/2009 1256   TCO2 28 09/22/2009 1256   O2SAT 68.0 09/22/2009 1256     CBG (last 3)   Recent Labs  06/26/16 1624 06/26/16 2049 06/27/16 0612  GLUCAP 154* 212* 129*     Imaging: Dg Chest 2 View  Result Date: 06/25/2016 CLINICAL DATA:  Swelling.  Bilateral arm and hand numbness. EXAM: CHEST  2 VIEW COMPARISON:  Chest CT 01/21/2011 FINDINGS: Mild cardiomegaly. Normal mediastinal contours. Coarse interstitial markings likely secondary to emphysema, when correlated with prior CT. No evidence of pulmonary edema. No pleural effusion. No focal airspace disease or pneumothorax. There is degenerative change in the thoracic spine. Hardware in the cervical spine is partially included. IMPRESSION: Cardiomegaly  without failure. Emphysema. Electronically Signed   By: Jeb Levering M.D.   On: 06/25/2016 21:31   Dg Cervical Spine 2-3 Views  Result Date: 06/27/2016 CLINICAL DATA:  C2-7 posterior cervical fusion. EXAM: CERVICAL SPINE - 2-3 VIEW; DG C-ARM 61-120 MIN COMPARISON:  MRI 06/26/2016. FLUOROSCOPY TIME: C-arm fluoroscopic images were obtained intraoperatively and submitted for post operative interpretation. Please see the performing provider's procedural report for the fluoroscopy time utilized. FINDINGS: Four spot fluoroscopic images are submitted from the operating room. These demonstrate a pre-existing anterior plate and screws extending inferiorly from C4. The inferior extent of the hardware is not visualized in the lateral projection, although based on the frontal examination and previous MRI and, extends from C4 through C7. There are new posterior pedicle screws and rods extending from C2 through C6. The alignment is normal. No perioperative complications are identified. IMPRESSION: Intraoperative views during revision of cervical fusion with posterior components extending from C2 through C6. No demonstrated complication. Electronically Signed   By: Richardean Sale M.D.   On: 06/27/2016 15:12   Ct Cervical Spine Wo Contrast  Result Date: 06/26/2016 CLINICAL DATA:  Myelopathy. Weakness in the arms and legs. Pre-surgical planning for cervical decompression. EXAM: CT CERVICAL SPINE WITHOUT CONTRAST TECHNIQUE: Multidetector CT imaging of the cervical spine was performed without intravenous contrast. Multiplanar CT image reconstructions were also generated. COMPARISON:  Brain MRI 06/25/2016 FINDINGS: Alignment: Trace retrolisthesis of C2 on C3. Skull base and vertebrae: Prior C4-C7 ACDF with solid osseous fusion at each level. Anterior plate and screws in place without surrounding lucency. No evidence of acute fracture or destructive osseous process. Soft tissues and spinal canal: No prevertebral fluid or  swelling. No visible canal hematoma. Upper chest: Minimal pleural-parenchymal scarring in the lung apices. Other: None. Disc levels: Mild ligamentous thickening and calcification posterior to the dens without evidence of significant canal stenosis at this level. The cervical spinal canal is small in caliber diffusely on a congenital basis. C2-3: Disc bulging/uncovering and uncovertebral spurring result in suspected moderate spinal stenosis and moderate left neural foraminal stenosis. C3-4: Central disc protrusion with suspected severe spinal stenosis and cord flattening as seen on the recent brain MRI. Mild right and moderate left facet arthrosis result in moderate left neural foraminal stenosis. C4-5: Prior ACDF. 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 No significant osseous neural foraminal stenosis. C5-6: Prior ACDF. Posterior longitudinal ligament ossification with potential mild-to-moderate residual congenital and acquired spinal stenosis. No significant osseous neural foraminal stenosis. C6-7: Prior ACDF. Diffuse osteophytic ridging with mild osseous neural foraminal stenosis bilaterally. C7-T1: Severe disc space narrowing. Diffuse spurring results in moderate to severe bilateral neural foraminal stenosis. Spinal canal not well  evaluated. T1-2: Mild disc space narrowing. Mild spurring and facet arthrosis result in moderate bilateral neural foraminal stenosis. Spinal canal not well evaluated. T2-3: Mild disc space narrowing. Evidence of prior right laminotomy. Moderately bulky ligamentum flavum ossification with at least mild spinal stenosis and moderate bilateral neural foraminal stenosis. IMPRESSION: 1. Solid C4-C7 ACDF. 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 2. Suspected central disc protrusion at C3-4 with likely severe spinal stenosis and cord  flattening as mentioned on recent brain MRI. Moderate left foraminal stenosis. 3. Potential moderate spinal stenosis at C2-3. 4. Moderate to severe neural foraminal stenosis at C7-T1 and in the upper thoracic spine. Electronically Signed   By: Logan Bores M.D.   On: 06/26/2016 12:10   Mr Brain Wo Contrast  Result Date: 06/25/2016 CLINICAL DATA:  Ongoing back pain for 1 month after fall.  Weakness. EXAM: MRI HEAD WITHOUT CONTRAST TECHNIQUE: Multiplanar, multiecho pulse sequences of the brain and surrounding structures were obtained without intravenous contrast. COMPARISON:  None. FINDINGS: Brain: No acute infarction, hemorrhage, hydrocephalus, extra-axial collection or mass lesion. Moderate T2 hyperintensity within irregular shape in the pons, likely chronic microvascular disease in this patient with vascular risk factors. No restricted diffusion or swelling to suggest pontine demyelination or mass. Microvascular change in the cerebral white matter is mild. Chronic lacune in the left thalamus. Vascular: Preserved major flow voids Skull and upper cervical spine: Partly seen advanced degenerative change in the upper cervical spine with probable cord flattening at C3-4. Sinuses/Orbits: No acute finding IMPRESSION: 1. No acute intracranial finding. 2. Prominent upper cervical spine degeneration with cord flattening at C3-4. Patient reportedly referred to ER by orthopedics, is there available outside cervical spine MRI? 3. Moderate signal abnormality in the pons, likely chronic microvascular ischemia in this patient with multiple vascular risk factors. Remote lacunar infarct in the left thalamus. Electronically Signed   By: Monte Fantasia M.D.   On: 06/25/2016 17:54   Mr Cervical Spine W Wo Contrast  Result Date: 06/26/2016 CLINICAL DATA:  Myelopathy.  Planning for decompression tomorrow. EXAM: MRI CERVICAL SPINE WITHOUT AND WITH CONTRAST TECHNIQUE: Multiplanar and multiecho pulse sequences of the cervical spine,  to include the craniocervical junction and cervicothoracic junction, were obtained without and with intravenous contrast. CONTRAST:  91mL MULTIHANCE GADOBENATE DIMEGLUMINE 529 MG/ML IV SOLN COMPARISON:  Cervical spine CT from earlier today FINDINGS: Alignment: Straightening of the cervical spine. C2-3 retrolisthesis. Vertebrae: No fracture, evidence of discitis, or bone lesion. C4-C6 ACDF with solid bony fusion Cord: Deformity from compression at C2-3 and C3-4. There is an 53mm intrathecal mass in the left canal the level of C2. The epicenter is likely extra medullary, although there could be involvement of the superficial cord. The mass is oriented along the C3 nerve, favor schwannoma. Posterior Fossa, vertebral arteries, paraspinal tissues: Negative. Disc levels: C2-3: Disc narrowing with endplate ridging greater to the left. Mild bilateral facet hypertrophy. Ligamentum flavum thickening. Advanced spinal stenosis with cord flattening. Left more than right foraminal impingement. Mass on the left as described above. C3-4: Advanced facet arthropathy on the left with spurring. Disc narrowing and bulging with endplate and uncovertebral ridging. Advanced spinal stenosis with cord compression and bilateral central cord T2 hyperintensity. biforaminal impingement. C4-5: ACDF with solid bony fusion. Congenital spinal canal narrowing, ligamentum flavum thickening, and ossified posterior longitudinal ligament causes continued moderate spinal stenosis. C5-6: ACDF with solid bony fusion. Congenital spinal canal narrowing, ligamentum flavum thickening, and ossified posterior longitudinal ligament causes continued  spinal stenosis. Eccentric right-sided spurring causes mild deformity of the cord. Central cord signal abnormality on the right has the appearance of myelomalacia. C6-7: ACDF with solid bony fusion. Sufficiently patent canal and foramina. C7-T1:Facet arthropathy with spurring. Degenerative disc narrowing with endplate  and uncovertebral ridging. biforaminal impingement. Mild spinal stenosis. T1-2 and T2-3 biforaminal narrowing from facet spurring. IMPRESSION: 1. Known advanced spinal stenosis with cord compression at C2-3 and C3-4, combination of degenerative factors and congenitally narrow canal. Cord signal abnormality at C3-4. Biforaminal impingement at both levels, worse on the left. 2. 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 3. C4-C6 ACDF with solid bony fusion. Residual canal stenosis from congenital factors and posterior longitudinal ligament ossification. 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 4. C7-T1 biforaminal impingement. Electronically Signed   By: Monte Fantasia M.D.   On: 06/26/2016 19:15   Dg C-arm 61-120 Min  Result Date: 06/27/2016 CLINICAL DATA:  C2-7 posterior cervical fusion. EXAM: CERVICAL SPINE - 2-3 VIEW; DG C-ARM 61-120 MIN COMPARISON:  MRI 06/26/2016. FLUOROSCOPY TIME: C-arm fluoroscopic images were obtained intraoperatively and submitted for post operative interpretation. Please see the performing provider's procedural report for the fluoroscopy time utilized. FINDINGS: Four spot fluoroscopic images are submitted from the operating room. These demonstrate a pre-existing anterior plate and screws extending inferiorly from C4. The inferior extent of the hardware is not visualized in the lateral projection, although based on the frontal examination and previous MRI and, extends from C4 through C7. There are new posterior pedicle screws and rods extending from C2 through C6. The alignment is normal. No perioperative complications are identified. IMPRESSION: Intraoperative views during revision of cervical fusion with posterior components extending from C2 through C6. No demonstrated complication. Electronically Signed   By: Richardean Sale M.D.   On: 06/27/2016 15:12     Studies: Echo 3/04 >> mod LVH, EF 60 to 65%, grade 1 DD  Antibiotics: Ancef 3/05  >>  Cultures:  Lines/tubes: ETT 3/05 >>  Events: 3/03 Admit 3/05 C spine decompression and fusion  Assessment/plan:  Central cord syndrome s/p C2-4 decompression with C2-C5 fusion. - post op care per ortho - RASS goal -2 to -3   Compromised airway after C spine surgery. Tobacco abuse. - f/u CXR, ABG - full vent support until okay with orthopedics - smoking cessation  Hx of OSA. - full vent support - f/u CXR, ABG - CPAP after extubation  Hx of HTN, HLD. - prn labetalol IV  - hold outpt lipitor, toprol, diovan until able to take pills  Hx of CVA. - hold outpt plavix for now  Hx of DM. - SSI - hold outpt metformin  DVT prophylaxis - SCDs SUP - Protonix Nutrition - NPO Goals of care - full code  D/w Dr. Rolena Infante  Updated pt's wife at bedside  CC time 82 minutes  Chesley Mires, MD [place] 06/27/2016, 4:55 PM Pager:  [phone] After 3pm call: [phone]

## 2016-06-28 DIAGNOSIS — J9601 Acute respiratory failure with hypoxia: Secondary | ICD-10-CM

## 2016-06-28 LAB — CBC
HCT: 30.6 % — ABNORMAL LOW (ref 39.0–52.0)
Hemoglobin: 10.5 g/dL — ABNORMAL LOW (ref 13.0–17.0)
MCH: 29.8 pg (ref 26.0–34.0)
MCHC: 34.3 g/dL (ref 30.0–36.0)
MCV: 86.9 fL (ref 78.0–100.0)
PLATELETS: 201 10*3/uL (ref 150–400)
RBC: 3.52 MIL/uL — ABNORMAL LOW (ref 4.22–5.81)
RDW: 14.1 % (ref 11.5–15.5)
WBC: 10.1 10*3/uL (ref 4.0–10.5)

## 2016-06-28 LAB — BPAM RBC
BLOOD PRODUCT EXPIRATION DATE: 201803272359
Blood Product Expiration Date: 201803282359
ISSUE DATE / TIME: 201803051320
ISSUE DATE / TIME: 201803051320
UNIT TYPE AND RH: 7300
Unit Type and Rh: 7300

## 2016-06-28 LAB — BASIC METABOLIC PANEL
ANION GAP: 8 (ref 5–15)
BUN: 16 mg/dL (ref 6–20)
CALCIUM: 8.5 mg/dL — AB (ref 8.9–10.3)
CO2: 23 mmol/L (ref 22–32)
CREATININE: 0.97 mg/dL (ref 0.61–1.24)
Chloride: 107 mmol/L (ref 101–111)
GFR calc Af Amer: >60 mL/min (ref >60)
GLUCOSE: 131 mg/dL — AB (ref 65–99)
Potassium: 4.4 mmol/L (ref 3.5–5.1)
Sodium: 138 mmol/L (ref 135–145)

## 2016-06-28 LAB — TYPE AND SCREEN
ABO/RH(D): B POS
ANTIBODY SCREEN: NEGATIVE
UNIT DIVISION: 0
UNIT DIVISION: 0

## 2016-06-28 LAB — HEMOGLOBIN A1C
Hgb A1c MFr Bld: 6 % — ABNORMAL HIGH (ref 4.8–5.6)
Mean Plasma Glucose: 126 mg/dL

## 2016-06-28 LAB — GLUCOSE, CAPILLARY
GLUCOSE-CAPILLARY: 121 mg/dL — AB (ref 65–99)
Glucose-Capillary: 133 mg/dL — ABNORMAL HIGH (ref 65–99)
Glucose-Capillary: 96 mg/dL (ref 65–99)
Glucose-Capillary: 92 mg/dL (ref 65–99)
Glucose-Capillary: 160 mg/dL — ABNORMAL HIGH (ref 65–99)

## 2016-06-28 LAB — TROPONIN I
Troponin I: <0.03 ng/mL (ref <0.03)
Troponin I: <0.03 ng/mL (ref <0.03)

## 2016-06-28 MED ORDER — WHITE PETROLATUM GEL
Status: AC
  Administered 2016-06-28: 22:00:00
  Filled 2016-06-28: qty 1

## 2016-06-28 MED ORDER — OXYCODONE HCL 5 MG PO TABS
10.0000 mg | ORAL_TABLET | ORAL | Status: DC | PRN
  Administered 2016-06-28 – 2016-07-04 (×19): 10 mg via ORAL
  Filled 2016-06-28 (×19): qty 2

## 2016-06-28 MED ORDER — MORPHINE SULFATE (PF) 2 MG/ML IV SOLN
2.0000 mg | INTRAVENOUS | Status: DC | PRN
  Administered 2016-06-28 – 2016-07-01 (×3): 2 mg via INTRAVENOUS
  Filled 2016-06-28 (×3): qty 1

## 2016-06-28 MED FILL — Thrombin For Soln 20000 Unit: CUTANEOUS | Qty: 1 | Status: AC

## 2016-06-28 NOTE — Evaluation (Signed)
Occupational Therapy Evaluation Patient Details Name: Mithcell Ozbirn MRN: [id number] DOB: 1950-05-10 Today's Date: 06/28/2016    History of Present Illness Patient is a 66 y/o male CVA, HTN, HLD, DM, smoker, OSA on CPAP presents with central cord syndrome s/p C2-5 posterior cervical fusion and C2-4 decompression. with compromised airway after surgery., extubated 3/6.   Clinical Impression   Pt admitted with above. He demonstrates the below listed deficits and will benefit from continued OT to maximize safety and independence with BADLs.  Pt presents to OT wth quadraparesis.  Rt shoulder ROM difficult to assess due to pain.  He requires max A +2 with bed mobililty and attempt to stand, and max - total A for ADLs.  He is very motivated and has great family support.  Feel he would benefit from CIR to allow him to maximize safety and independence with ADLs to allow him to return home with wife.       Follow Up Recommendations  CIR    Equipment Recommendations  Tub/shower bench    Recommendations for Other Services Rehab consult     Precautions / Restrictions Precautions Precautions: Cervical;Fall Precaution Comments: Hemovac Required Braces or Orthoses: Cervical Brace Cervical Brace: Hard collar Restrictions Weight Bearing Restrictions: No      Mobility Bed Mobility Overal bed mobility: Needs Assistance Bed Mobility: Rolling;Sidelying to Sit;Sit to Sidelying Rolling: Max assist;+2 for physical assistance;+2 for safety/equipment Sidelying to sit: HOB elevated;Max assist;+2 for physical assistance     Sit to sidelying: Max assist;+2 for physical assistance General bed mobility comments: Cues for log roll technique. Assist to roll with cues for sequencing, manage LEs and to elevate trunk. Assist to bring LEs into bed and to lower trunk to return to supine.  Transfers Overall transfer level: Needs assistance Equipment used: 2 person hand held assist Transfers: Sit to/from Stand           General transfer comment: Attempted sit to stand x3 with Max A of 2 with therapists supporting bil knees and providing cues for hip extension. Able to partially stand x1 and clear bottom x2 to help scoot bottom towards left in bed.    Balance Overall balance assessment: Needs assistance Sitting-balance support: No upper extremity supported;Feet supported;Bilateral upper extremity supported Sitting balance-Leahy Scale: Fair Sitting balance - Comments: Able to sit unsupported ~25 mins EOB.     Standing balance-Leahy Scale: Zero Standing balance comment: Unable to fully stand despite assist of 2.                            ADL Overall ADL's : Needs assistance/impaired Eating/Feeding: Maximal assistance;Bed level   Grooming: Wash/dry hands;Wash/dry face;Minimal assistance;Bed level   Upper Body Bathing: Total assistance;Bed level   Lower Body Bathing: Total assistance;Bed level   Upper Body Dressing : Total assistance;Bed level   Lower Body Dressing: Total assistance;Bed level   Toilet Transfer: Total assistance Toilet Transfer Details (indicate cue type and reason): uanble to attempt  Toileting- Clothing Manipulation and Hygiene: Total assistance;Bed level       Functional mobility during ADLs: +2 for physical assistance;Maximal assistance       Vision Patient Visual Report: No change from baseline       Perception     Praxis      Pertinent Vitals/Pain Pain Assessment: Faces Faces Pain Scale: Hurts whole lot Pain Location: RUE, back Pain Descriptors / Indicators: Aching;Discomfort Pain Intervention(s): Premedicated before session;Repositioned;Monitored during session  Hand Dominance Right   Extremity/Trunk Assessment Upper Extremity Assessment Upper Extremity Assessment: RUE deficits/detail;LUE deficits/detail RUE Deficits / Details: Difficult to assess due to increased pain with attempts at ROM.  Elbow grossly 3+/5, grasp and release  of hand grossly 3/5 - pt with board on Rt wrist due to A-line so unable to assess wrist ROM  RUE: Unable to fully assess due to pain RUE Sensation: decreased light touch RUE Coordination: decreased fine motor;decreased gross motor LUE Deficits / Details: Grossly 3/5 shoulder; elbow grossly 3/5 - 3+/5; wrist grossly 3/5; hand grasp/release 3+/5    Lower Extremity Assessment Lower Extremity Assessment: Defer to PT evaluation RLE Deficits / Details: Grossly ~2+/5 knee extension, hip flexion, 0/5 DF, 1/5 PF RLE Sensation: decreased light touch RLE Coordination: decreased fine motor;decreased gross motor LLE Deficits / Details: Grossly ~3/5 knee extension, 3/5 hip flexion, 4/5 DF/PF LLE Sensation:  (Sensation WFL.) LLE Coordination: decreased fine motor;decreased gross motor   Cervical / Trunk Assessment Cervical / Trunk Assessment: Other exceptions Cervical / Trunk Exceptions: s/p cervical spine surgery   Communication Communication Communication: No difficulties   Cognition Arousal/Alertness: Awake/alert Behavior During Therapy: WFL for tasks assessed/performed Overall Cognitive Status: Within Functional Limits for tasks assessed                     General Comments  BP 154/79; HR 110-130s bpm, Sp02 high 80s-90s on 4L/min 02.     Exercises       Shoulder Instructions      Home Living Family/patient expects to be discharged to:: Private residence Living Arrangements: Spouse/significant other Available Help at Discharge: Family Type of Home: House Home Access: Stairs to enter [employer] of Steps: 1/2 step (porch stoop)   Home Layout: One level     Bathroom Shower/Tub: Teacher, early years/pre: Handicapped height     Home Equipment: Environmental consultant - 2 wheels;Bedside commode;Shower seat          Prior Functioning/Environment Level of Independence: Needs assistance  Gait / Transfers Assistance Needed: Pt reports that one month ago he was  independent and moving well. Since then, he has had multiple falls at home; amb short distances with RW.  ADL's / Homemaking Assistance Needed: Requires assist for ADLs/IADLs   Comments: Retired from work. Uses CPAP.         OT Problem List: Decreased strength;Decreased range of motion;Decreased activity tolerance;Impaired balance (sitting and/or standing);Decreased coordination;Decreased knowledge of use of DME or AE;Decreased knowledge of precautions;Impaired sensation;Obesity;Impaired UE functional use;Pain      OT Treatment/Interventions: Self-care/ADL training;Therapeutic exercise;Neuromuscular education;DME and/or AE instruction;Manual therapy;Splinting;Therapeutic activities;Patient/family education;Balance training    OT Goals(Current goals can be found in the care plan section) Acute Rehab OT Goals Patient Stated Goal: to take my wife on a vacation OT Goal Formulation: With patient Time For Goal Achievement: 07/12/16 Potential to Achieve Goals: Good ADL Goals Pt Will Perform Eating: with min assist;with adaptive utensils;sitting;bed level Pt Will Perform Grooming: with min assist;with adaptive equipment;sitting;bed level Pt Will Perform Upper Body Bathing: with mod assist;sitting;bed level Pt Will Transfer to Toilet: with max assist;with +2 assist;stand pivot transfer;bedside commode Pt/caregiver will Perform Home Exercise Program: Increased ROM;Increased strength;Right Upper extremity;Left upper extremity;With minimal assist  OT Frequency: Min 3X/week   Barriers to D/C:            Co-evaluation PT/OT/SLP Co-Evaluation/Treatment: Yes Reason for Co-Treatment: Complexity of the patient's impairments (multi-system involvement);For patient/therapist safety PT goals addressed during session: Mobility/safety with mobility  OT goals addressed during session: ADL's and self-care      End of Session Equipment Utilized During Treatment: Cervical collar Nurse Communication:  Mobility status  Activity Tolerance: Patient tolerated treatment well Patient left: in bed;with call bell/phone within reach  OT Visit Diagnosis: Muscle weakness (generalized) (M62.81)                ADL either performed or assessed with clinical judgement  Time: 1458-1550 OT Time Calculation (min): 52 min Charges:  OT General Charges $OT Visit: 1 Procedure OT Evaluation $OT Eval Moderate Complexity: 1 Procedure OT Treatments $Therapeutic Activity: 8-22 mins G-Codes:     [facility], OTR/L [id number]   Lucille Passy M 06/28/2016, 4:50 PM

## 2016-06-28 NOTE — Progress Notes (Signed)
PCCM Consult Note  Admission date: 06/25/2016 Consult date: 06/27/2016 Referring provider: Dr. Rolena Infante, Ortho  CC: Back pain  HPI: Hx from chart and medical team.  66 yo male smoker had fall about 1 month prior to admission and had persistent back pain.  He also had difficulty moving his arms.  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 ? Of ekg changes in or  Vital signs: BP (!) 143/72 (BP Location: Left Arm)   Pulse 78   Temp 97.3 F (36.3 C) (Axillary)   Resp 15   Ht 6\' 1"  (1.854 m)   Wt 292 lb 8.8 oz (132.7 kg)   SpO2 98%   BMI 38.60 kg/m   Intake/output: I/O last 3 completed shifts: In: 8646.5 [I.V.:7126.5; Blood:670; IV [id number] Out: [id number] [Urine:2865; Drains:145; Blood:350]  General: in ICU Neuro: sedated on diprivan HEENT: pupils pinpoint, ETT in place, Aspen collar on Cardiac: regular, no murmur Chest: no wheeze Abd: soft, non tender, obese Ext: no edema Skin: no rashes   CMP Latest Ref Rng & Units 06/28/2016 06/27/2016 06/27/2016  Glucose 65 - 99 mg/dL 131(H) 179(H) -  BUN 6 - 20 mg/dL 16 - -  Creatinine 0.61 - 1.24 mg/dL 0.97 - -  Sodium 135 - 145 mmol/L 138 138 136  Potassium 3.5 - 5.1 mmol/L 4.4 4.3 4.7  Chloride 101 - 111 mmol/L 107 - -  CO2 22 - 32 mmol/L 23 - -  Calcium 8.9 - 10.3 mg/dL 8.5(L) - -  Total Protein 6.5 - 8.1 g/dL - - -  Total Bilirubin 0.3 - 1.2 mg/dL - - -  Alkaline Phos 38 - 126 U/L - - -  AST 15 - 41 U/L - - -  ALT 17 - 63 U/L - - -     CBC Latest Ref Rng & Units 06/28/2016 06/27/2016 06/27/2016  WBC 4.0 - 10.5 K/uL 10.1 - -  Hemoglobin 13.0 - 17.0 g/dL 10.5(L) 8.8(L) 6.5(LL)  Hematocrit 39.0 - 52.0 % 30.6(L) 26.0(L) 19.0(L)  Platelets 150 - 400 K/uL 201 - -     ABG    Component Value Date/Time   PHART 7.377 06/27/2016 1638   PCO2ART 38.1 06/27/2016 1638   PO2ART 183 (H) 06/27/2016 1638   HCO3 22.0 06/27/2016 1638   TCO2 28 06/27/2016 1314   ACIDBASEDEF 2.5 (H) 06/27/2016 1638   O2SAT 99.2 06/27/2016 1638     CBG (last 3)    Recent Labs  06/27/16 2338 06/28/16 0332 06/28/16 0745  GLUCAP 159* 133* 96     Imaging: Dg Cervical Spine 2-3 Views  Result Date: 06/27/2016 CLINICAL DATA:  C2-7 posterior cervical fusion. EXAM: CERVICAL SPINE - 2-3 VIEW; DG C-ARM 61-120 MIN COMPARISON:  MRI 06/26/2016. FLUOROSCOPY TIME: C-arm fluoroscopic images were obtained intraoperatively and submitted for post operative interpretation. Please see the performing provider's procedural report for the fluoroscopy time utilized. FINDINGS: Four spot fluoroscopic images are submitted from the operating room. These demonstrate a pre-existing anterior plate and screws extending inferiorly from C4. The inferior extent of the hardware is not visualized in the lateral projection, although based on the frontal examination and previous MRI and, extends from C4 through C7. There are new posterior pedicle screws and rods extending from C2 through C6. The alignment is normal. No perioperative complications are identified. IMPRESSION: Intraoperative views during revision of cervical fusion with posterior components extending from C2 through C6. No demonstrated complication. Electronically Signed   By: Richardean Sale M.D.   On: 06/27/2016 15:12  Ct Cervical Spine Wo Contrast  Result Date: 06/26/2016 CLINICAL DATA:  Myelopathy. Weakness in the arms and legs. Pre-surgical planning for cervical decompression. EXAM: CT CERVICAL SPINE WITHOUT CONTRAST TECHNIQUE: Multidetector CT imaging of the cervical spine was performed without intravenous contrast. Multiplanar CT image reconstructions were also generated. COMPARISON:  Brain MRI 06/25/2016 FINDINGS: Alignment: Trace retrolisthesis of C2 on C3. Skull base and vertebrae: Prior C4-C7 ACDF with solid osseous fusion at each level. Anterior plate and screws in place without surrounding lucency. No evidence of acute fracture or destructive osseous process. Soft tissues and spinal canal: No prevertebral fluid or  swelling. No visible canal hematoma. Upper chest: Minimal pleural-parenchymal scarring in the lung apices. Other: None. Disc levels: Mild ligamentous thickening and calcification posterior to the dens without evidence of significant canal stenosis at this level. The cervical spinal canal is small in caliber diffusely on a congenital basis. C2-3: Disc bulging/uncovering and uncovertebral spurring result in suspected moderate spinal stenosis and moderate left neural foraminal stenosis. C3-4: Central disc protrusion with suspected severe spinal stenosis and cord flattening as seen on the recent brain MRI. Mild right and moderate left facet arthrosis result in moderate left neural foraminal stenosis. C4-5: Prior ACDF. 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 No significant osseous neural foraminal stenosis. C5-6: Prior ACDF. Posterior longitudinal ligament ossification with potential mild-to-moderate residual congenital and acquired spinal stenosis. No significant osseous neural foraminal stenosis. C6-7: Prior ACDF. Diffuse osteophytic ridging with mild osseous neural foraminal stenosis bilaterally. C7-T1: Severe disc space narrowing. Diffuse spurring results in moderate to severe bilateral neural foraminal stenosis. Spinal canal not well evaluated. T1-2: Mild disc space narrowing. Mild spurring and facet arthrosis result in moderate bilateral neural foraminal stenosis. Spinal canal not well evaluated. T2-3: Mild disc space narrowing. Evidence of prior right laminotomy. Moderately bulky ligamentum flavum ossification with at least mild spinal stenosis and moderate bilateral neural foraminal stenosis. IMPRESSION: 1. Solid C4-C7 ACDF. 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 2. Suspected central disc protrusion at C3-4 with likely severe spinal stenosis and cord  flattening as mentioned on recent brain MRI. Moderate left foraminal stenosis. 3. Potential moderate spinal stenosis at C2-3. 4. Moderate to severe neural foraminal stenosis at C7-T1 and in the upper thoracic spine. Electronically Signed   By: Logan Bores M.D.   On: 06/26/2016 12:10   Mr Cervical Spine W Wo Contrast  Result Date: 06/26/2016 CLINICAL DATA:  Myelopathy.  Planning for decompression tomorrow. EXAM: MRI CERVICAL SPINE WITHOUT AND WITH CONTRAST TECHNIQUE: Multiplanar and multiecho pulse sequences of the cervical spine, to include the craniocervical junction and cervicothoracic junction, were obtained without and with intravenous contrast. CONTRAST:  22mL MULTIHANCE GADOBENATE DIMEGLUMINE 529 MG/ML IV SOLN COMPARISON:  Cervical spine CT from earlier today FINDINGS: Alignment: Straightening of the cervical spine. C2-3 retrolisthesis. Vertebrae: No fracture, evidence of discitis, or bone lesion. C4-C6 ACDF with solid bony fusion Cord: Deformity from compression at C2-3 and C3-4. There is an 41mm intrathecal mass in the left canal the level of C2. The epicenter is likely extra medullary, although there could be involvement of the superficial cord. The mass is oriented along the C3 nerve, favor schwannoma. Posterior Fossa, vertebral arteries, paraspinal tissues: Negative. Disc levels: C2-3: Disc narrowing with endplate ridging greater to the left. Mild bilateral facet hypertrophy. Ligamentum flavum thickening. Advanced spinal stenosis with cord flattening. Left more than right foraminal impingement. Mass on the left as  described above. C3-4: Advanced facet arthropathy on the left with spurring. Disc narrowing and bulging with endplate and uncovertebral ridging. Advanced spinal stenosis with cord compression and bilateral central cord T2 hyperintensity. biforaminal impingement. C4-5: ACDF with solid bony fusion. Congenital spinal canal narrowing, ligamentum flavum thickening, and ossified posterior  longitudinal ligament causes continued moderate spinal stenosis. C5-6: ACDF with solid bony fusion. Congenital spinal canal narrowing, ligamentum flavum thickening, and ossified posterior longitudinal ligament causes continued spinal stenosis. Eccentric right-sided spurring causes mild deformity of the cord. Central cord signal abnormality on the right has the appearance of myelomalacia. C6-7: ACDF with solid bony fusion. Sufficiently patent canal and foramina. C7-T1:Facet arthropathy with spurring. Degenerative disc narrowing with endplate and uncovertebral ridging. biforaminal impingement. Mild spinal stenosis. T1-2 and T2-3 biforaminal narrowing from facet spurring. IMPRESSION: 1. Known advanced spinal stenosis with cord compression at C2-3 and C3-4, combination of degenerative factors and congenitally narrow canal. Cord signal abnormality at C3-4. Biforaminal impingement at both levels, worse on the left. 2. 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 3. C4-C6 ACDF with solid bony fusion. Residual canal stenosis from congenital factors and posterior longitudinal ligament ossification. 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 4. C7-T1 biforaminal impingement. Electronically Signed   By: Monte Fantasia M.D.   On: 06/26/2016 19:15   Dg Chest Port 1 View  Result Date: 06/27/2016 CLINICAL DATA:  Endotracheal tube placement EXAM: PORTABLE CHEST 1 VIEW COMPARISON:  06/25/2016 FINDINGS: At 1703 hours. Endotracheal tube tip is 4.8 cm above the base of the carina. Lung volumes are low. No pulmonary edema or focal airspace consolidation. Probable atelectasis left lung base. Cardiopericardial silhouette is at upper limits of normal for size. The visualized bony structures of the thorax are intact. Cervical fusion hardware evident with probable surgical drains overlying the lower neck. IMPRESSION: Endotracheal tube tip 4.8 cm above the base of the carina. Low volumes with left base  atelectasis. Electronically Signed   By: Misty Stanley M.D.   On: 06/27/2016 17:13   Dg C-arm 61-120 Min  Result Date: 06/27/2016 CLINICAL DATA:  C2-7 posterior cervical fusion. EXAM: CERVICAL SPINE - 2-3 VIEW; DG C-ARM 61-120 MIN COMPARISON:  MRI 06/26/2016. FLUOROSCOPY TIME: C-arm fluoroscopic images were obtained intraoperatively and submitted for post operative interpretation. Please see the performing provider's procedural report for the fluoroscopy time utilized. FINDINGS: Four spot fluoroscopic images are submitted from the operating room. These demonstrate a pre-existing anterior plate and screws extending inferiorly from C4. The inferior extent of the hardware is not visualized in the lateral projection, although based on the frontal examination and previous MRI and, extends from C4 through C7. There are new posterior pedicle screws and rods extending from C2 through C6. The alignment is normal. No perioperative complications are identified. IMPRESSION: Intraoperative views during revision of cervical fusion with posterior components extending from C2 through C6. No demonstrated complication. Electronically Signed   By: Richardean Sale M.D.   On: 06/27/2016 15:12     Studies: Echo 3/04 >> mod LVH, EF 60 to 65%, grade 1 DD  Antibiotics: Ancef 3/05 >>  Cultures:  Lines/tubes: ETT 3/05 >> Aline 3/5>>  Events: 3/03 Admit 3/05 C spine decompression and fusion. Some question of ekg changes in OR , not documented 3/6 heavily sedated, will decrease for extubation  Assessment/plan:  ? 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 - post op care per ortho - RASS goal 0  Compromised airway after C spine surgery. Tobacco abuse. - f/u CXR, ABG - full vent support till fully awake and negative cuff leak test. NIMVS nocturnal and PRN. Note multilevel cervical fusion  current and in past = difficult airway. -check 12 lead prior to extubation - smoking cessation  Hx of OSA. - full vent support - f/u CXR, ABG - CPAP after extubation  Hx of HTN, HLD. - prn labetalol IV  - hold outpt lipitor, toprol, diovan until able to take pills  Hx of CVA. - hold outpt plavix for now  Hx of DM. CBG (last 3)   Recent Labs  06/27/16 2338 06/28/16 0332 06/28/16 0745  GLUCAP 159* 133* 96     - SSI - hold outpt metformin  DVT prophylaxis - SCDs SUP - Protonix Nutrition - NPO Goals of care - full code  D/w Dr. Rolena Infante. Ok to wean to extubation form ortho standpoint  3/6  No family at bedside  App CC time 30 minutes  Richardson Landry Minor ACNP Maryanna Shape PCCM Pager [phone] till 3 pm If no answer page [phone] 06/28/2016, 8:08 AM    ATTENDING NOTE / ATTESTATION NOTE :   I have discussed the case with the resident/APP  Richardson Landry Minor NP.   I agree with the resident/APP's  history, physical examination, assessment, and plans.    I have edited the above note and modified it according to our agreed history, physical examination, assessment and plan.   Briefly, pt with central cord syndrome s/p C2-4 decompression with C2-C5 fusion. He was kept intubated post operatively as the OR was 8-9 hrs.  Pt with OSA, questionable compliance on cpap.  No issues overnight. He did well on pressure support trial this morning. Good volumes. He has a cuff leak. (-) subjective complaints.   Vitals:  Vitals:   06/28/16 0700 06/28/16 0800 06/28/16 0900 06/28/16 1000  BP: (!) 102/55 (!) 143/72 (!) 146/72 (!) 156/82  Pulse:  78 76 86  Resp: 14 15 16 15   Temp:  97.1 F (36.2 C)    TempSrc:  Axillary    SpO2:  98% 100% 100%  Weight:      Height:        Constitutional/General: well-nourished, well-developed, intubated, not in any distress  Body mass index is 38.6 kg/m. Wt Readings from Last 3 Encounters:  06/27/16 132.7 kg (292 lb 8.8 oz)  06/23/16 130.2 kg (287 lb)   04/27/16 130.2 kg (287 lb)    HEENT: PERLA, anicteric sclerae. (-) Oral thrush. Intubated, ETT in place  Neck: No masses. Midline trachea. No JVD, (-) LAD. (-) bruits appreciated.  Respiratory/Chest: Grossly normal chest. (-) deformity. (-) Accessory muscle use.  Symmetric expansion. Diminished BS on both lower lung zones. (-) wheezing, crackles, rhonchi (-) egophony  Cardiovascular: Regular rate and  rhythm, heart sounds normal, no murmur or gallops,  (-) peripheral edema  Gastrointestinal:  Normal bowel sounds. Soft, non-tender. No hepatosplenomegaly.  (-) masses.   Musculoskeletal:  Normal muscle tone.   Extremities: Grossly normal. (-) clubbing, cyanosis.  (-) edema  Skin: (-) rash,lesions seen.   Neurological/Psychiatric :  intubated. CN grossly intact. Follows commands. Chronically we need to weakness related to cervical compression. Able to move toes which is more than his baseline.    [place] Recent Labs     06/25/16  1635   06/27/16  1314  06/27/16  1409  06/28/16  0447  WBC  8.0   --    --    --   10.1  HGB  12.5*   < >  6.5*  8.8*  10.5*  HCT  37.6*   < >  19.0*  26.0*  30.6*  PLT  307   --    --    --   201   < > = values in this interval not displayed.    Coag's No results for input(s): APTT, INR in the last 72 hours.  BMET Recent Labs     06/25/16  1635   06/27/16  1314  06/27/16  1409  06/28/16  0447  NA  137   < >  136  138  138  K  4.4   < >  4.7  4.3  4.4  CL  106   --    --    --   107  CO2  26   --    --    --   23  BUN  19   --    --    --   16  CREATININE  1.15   --    --    --   0.97  GLUCOSE  116*   --    --   179*  131*   < > = values in this interval not displayed.    Electrolytes Recent Labs     06/25/16  1635  06/28/16  0447  CALCIUM  9.4  8.5*    Sepsis Markers No results for input(s): PROCALCITON, O2SATVEN in the last 72 hours.  Invalid input(s): LACTICACIDVEN  ABG Recent Labs     06/27/16  1143  06/27/16   1314  06/27/16  1638  PHART  7.384  7.458*  7.377  PCO2ART  35.8  37.3  38.1  PO2ART  205.0*  194.0*  183*    Liver Enzymes Recent Labs     06/25/16  1635  AST  24  ALT  27  ALKPHOS  99  BILITOT  0.8  ALBUMIN  3.7    Cardiac Enzymes Recent Labs     06/28/16  0826  TROPONINI  <0.03    Glucose Recent Labs     06/27/16  0612  06/27/16  1646  06/27/16  2002  06/27/16  2338  06/28/16  0332  06/28/16  0745  GLUCAP  129*  168*  167*  159*  133*  96    Imaging Dg Cervical Spine 2-3 Views  Result Date: 06/27/2016 CLINICAL DATA:  C2-7 posterior cervical fusion. EXAM: CERVICAL SPINE - 2-3 VIEW; DG C-ARM 61-120 MIN COMPARISON:  MRI 06/26/2016. FLUOROSCOPY TIME: C-arm fluoroscopic images were obtained intraoperatively and submitted for post operative interpretation. Please see the performing provider's procedural report for the fluoroscopy time utilized. FINDINGS: Four spot fluoroscopic images are submitted from the operating room. These demonstrate a pre-existing anterior plate and screws extending inferiorly from C4. The inferior extent of the hardware is not visualized in the lateral projection, although based on the frontal examination and previous MRI and, extends from C4 through C7. There are new posterior pedicle screws and rods extending from C2 through C6. The alignment is normal. No perioperative complications are identified. IMPRESSION: Intraoperative views during revision of cervical fusion with posterior components extending from C2 through C6. No demonstrated complication. Electronically Signed   By: Richardean Sale M.D.   On: 06/27/2016 15:12   Mr Cervical Spine W Wo Contrast  Result Date: 06/26/2016 CLINICAL DATA:  Myelopathy.  Planning for decompression tomorrow. EXAM: MRI CERVICAL SPINE WITHOUT AND WITH CONTRAST TECHNIQUE: Multiplanar and multiecho pulse sequences of the cervical spine, to include the craniocervical junction and cervicothoracic junction, were obtained  without and with intravenous contrast. CONTRAST:  46mL MULTIHANCE GADOBENATE DIMEGLUMINE 529 MG/ML IV SOLN COMPARISON:  Cervical spine CT from earlier today FINDINGS: Alignment: Straightening of the cervical spine. C2-3 retrolisthesis. Vertebrae: No fracture, evidence of discitis, or bone lesion. C4-C6 ACDF with solid bony fusion Cord: Deformity from compression at C2-3 and C3-4. There is an 32mm intrathecal mass in the left canal the level of C2. The epicenter is likely extra medullary, although there could be involvement of the superficial cord. The mass is oriented along the C3 nerve, favor schwannoma. Posterior Fossa, vertebral arteries, paraspinal tissues: Negative. Disc levels: C2-3: Disc narrowing with endplate ridging greater to the left. Mild bilateral facet hypertrophy. Ligamentum flavum thickening. Advanced spinal stenosis with cord flattening. Left more than right foraminal impingement. Mass on the left as described above. C3-4: Advanced facet arthropathy on the left with spurring. Disc narrowing and bulging with endplate and uncovertebral ridging. Advanced spinal stenosis with cord compression and bilateral central cord T2 hyperintensity. biforaminal impingement. C4-5: ACDF with solid bony fusion. Congenital spinal canal narrowing, ligamentum flavum thickening, and ossified posterior longitudinal ligament causes continued moderate spinal stenosis. C5-6: ACDF with solid bony fusion. Congenital spinal canal narrowing, ligamentum flavum thickening, and ossified posterior longitudinal ligament causes continued spinal stenosis. Eccentric right-sided spurring causes mild deformity of the cord. Central cord signal abnormality on the right has the appearance of myelomalacia. C6-7: ACDF with solid bony fusion. Sufficiently patent canal and foramina. C7-T1:Facet arthropathy with spurring. Degenerative disc narrowing with endplate and uncovertebral ridging. biforaminal impingement. Mild spinal stenosis. T1-2 and  T2-3 biforaminal narrowing from facet spurring. IMPRESSION: 1. Known advanced spinal stenosis with cord compression at C2-3 and C3-4, combination of degenerative factors and congenitally narrow canal. Cord signal abnormality at C3-4. Biforaminal impingement at both levels, worse on the left. 2. 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 3. C4-C6 ACDF with solid bony fusion. Residual canal stenosis from congenital factors and posterior longitudinal ligament ossification. 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 4. C7-T1 biforaminal impingement. Electronically Signed   By: Monte Fantasia M.D.   On: 06/26/2016 19:15   Dg Chest Port 1 View  Result Date: 06/27/2016 CLINICAL DATA:  Endotracheal tube placement EXAM: PORTABLE CHEST 1 VIEW COMPARISON:  06/25/2016 FINDINGS: At 1703 hours. Endotracheal tube tip is 4.8 cm above the base of the carina. Lung volumes are low. No pulmonary edema or focal airspace consolidation. Probable atelectasis left lung base. Cardiopericardial silhouette is at upper limits of normal for size. The visualized bony structures of the thorax are intact. Cervical fusion hardware evident with probable surgical drains overlying the lower neck. IMPRESSION: Endotracheal tube tip 4.8 cm above the base of the carina. Low volumes with left base atelectasis. Electronically Signed   By: Misty Stanley M.D.   On: 06/27/2016 17:13   Dg C-arm 61-120 Min  Result Date: 06/27/2016 CLINICAL DATA:  C2-7 posterior cervical fusion. EXAM: CERVICAL SPINE - 2-3 VIEW; DG C-ARM 61-120 MIN COMPARISON:  MRI 06/26/2016. FLUOROSCOPY TIME: C-arm fluoroscopic images were obtained intraoperatively and submitted for post operative interpretation. Please see the performing provider's procedural report for the fluoroscopy time utilized. FINDINGS: Four spot fluoroscopic images are submitted from the operating room. These demonstrate a pre-existing anterior  plate and screws extending inferiorly  from C4. The inferior extent of the hardware is not visualized in the lateral projection, although based on the frontal examination and previous MRI and, extends from C4 through C7. There are new posterior pedicle screws and rods extending from C2 through C6. The alignment is normal. No perioperative complications are identified. IMPRESSION: Intraoperative views during revision of cervical fusion with posterior components extending from C2 through C6. No demonstrated complication. Electronically Signed   By: Richardean Sale M.D.   On: 06/27/2016 15:12   Assessment/Plan : S/P  s/p C2-4 decompression with C2-C5 fusion.  - per Orthopedics - keep cervical collar - plan to extubate - Incentive spirometry. - EKG was normal this am   OSA, on cpap - cpap at HS. we'll ask his wife to bring his CPAP machine.   HTN - prn labetalol   CVA - Plavix on hold 2/2 recent surgery    I spent  30  minutes of Critical Care time with this patient today. This is my time spent independent of the APP or resident.   Family : No family at bedside.    Monica Becton, MD 06/28/2016, 11:32 AM [facility] Pager [phone] After 3 pm or if no answer, call [phone]

## 2016-06-28 NOTE — Evaluation (Signed)
Physical Therapy Evaluation Patient Details Name: Rodney Crane MRN: [id number] DOB: 07-20-1950 Today's Date: 06/28/2016   History of Present Illness  Patient is a 66 y/o male CVA, HTN, HLD, DM, smoker, OSA on CPAP presents with central cord syndrome s/p C2-5 posterior cervical fusion and C2-4 decompression. with compromised airway after surgery., extubated 3/6.  Clinical Impression  Patient presents with pain, generalized weakness, impaired sensation and impaired mobility s/p above. Tolerated sitting EOB ~25 mins with Min guard assist. Attempted standing but only able to clear bottom x2 and partially stand with Max A of 2 and cues for hip extension/knee stability. Pt having a progressive decline in mobility the last dew weeks with multiple falls and needing assist for ADls but prior to this, independent. Education re: log roll technique, mobility. Would benefit from CIR to maximize independence and mobility prior to return home. Will follow acutely.    Follow Up Recommendations CIR    Equipment Recommendations  Other (comment) (TBA)    Recommendations for Other Services Rehab consult     Precautions / Restrictions Precautions Precautions: Cervical;Fall Precaution Comments: Hemovac Required Braces or Orthoses: Cervical Brace Cervical Brace: Hard collar Restrictions Weight Bearing Restrictions: No      Mobility  Bed Mobility Overal bed mobility: Needs Assistance Bed Mobility: Rolling;Sidelying to Sit;Sit to Sidelying Rolling: Max assist;+2 for physical assistance;+2 for safety/equipment Sidelying to sit: HOB elevated;Max assist;+2 for physical assistance     Sit to sidelying: Max assist;+2 for physical assistance General bed mobility comments: Cues for log roll technique. Assist to roll with cues for sequencing, manage LEs and to elevate trunk. Assist to bring LEs into bed and to lower trunk to return to supine.  Transfers Overall transfer level: Needs assistance Equipment  used: 2 person hand held assist Transfers: Sit to/from Stand           General transfer comment: Attempted sit to stand x3 with Max A of 2 with therapists supporting bil knees and providing cues for hip extension. Able to partially stand x1 and clear bottom x2 to help scoot bottom towards left in bed.  Ambulation/Gait                Stairs            Wheelchair Mobility    Modified Rankin (Stroke Patients Only)       Balance Overall balance assessment: Needs assistance Sitting-balance support: No upper extremity supported;Feet supported;Bilateral upper extremity supported Sitting balance-Leahy Scale: Fair Sitting balance - Comments: Able to sit unsupported ~25 mins EOB.     Standing balance-Leahy Scale: Zero Standing balance comment: Unable to fully stand despite assist of 2.                             Pertinent Vitals/Pain Pain Assessment: Faces Faces Pain Scale: Hurts whole lot Pain Location: RUE, back Pain Descriptors / Indicators: Aching;Discomfort Pain Intervention(s): Premedicated before session;Monitored during session;Limited activity within patient's tolerance;Repositioned    Home Living Family/patient expects to be discharged to:: Private residence Living Arrangements: Spouse/significant other Available Help at Discharge: Family Type of Home: House Home Access: Stairs to enter   Technical brewer of Steps: 1/2 step (porch stoop) Home Layout: One level Home Equipment: Environmental consultant - 2 wheels;Bedside commode;Shower seat      Prior Function Level of Independence: Needs assistance   Gait / Transfers Assistance Needed: Pt reports that one month ago he was independent and moving well. Since then,  he has had multiple falls at home; amb short distances with RW.   ADL's / Homemaking Assistance Needed: Requires assist for ADLs/IADLs  Comments: Retired from work. Uses CPAP.      Hand Dominance   Dominant Hand: Right     Extremity/Trunk Assessment   Upper Extremity Assessment Upper Extremity Assessment: Defer to OT evaluation    Lower Extremity Assessment Lower Extremity Assessment: RLE deficits/detail;LLE deficits/detail RLE Deficits / Details: Grossly ~2+/5 knee extension, hip flexion, 0/5 DF, 1/5 PF RLE Sensation: decreased light touch RLE Coordination: decreased fine motor;decreased gross motor LLE Deficits / Details: Grossly ~3/5 knee extension, 3/5 hip flexion, 4/5 DF/PF LLE Sensation:  (Sensation WFL.) LLE Coordination: decreased fine motor;decreased gross motor    Cervical / Trunk Assessment Cervical / Trunk Assessment: Other exceptions Cervical / Trunk Exceptions: s/p cervical spine surgery  Communication   Communication: No difficulties  Cognition Arousal/Alertness: Awake/alert Behavior During Therapy: WFL for tasks assessed/performed Overall Cognitive Status: Within Functional Limits for tasks assessed                      General Comments General comments (skin integrity, edema, etc.): BP 154/79; HR 110-130s bpm, Sp02 high 80s-90s on 4L/min 02.     Exercises     Assessment/Plan    PT Assessment Patient needs continued PT services  PT Problem List Decreased strength;Decreased mobility;Obesity;Decreased coordination;Decreased range of motion;Decreased activity tolerance;Cardiopulmonary status limiting activity;Pain;Impaired sensation;Decreased balance       PT Treatment Interventions Therapeutic activities;Gait training;Therapeutic exercise;Patient/family education;Balance training;Functional mobility training;Neuromuscular re-education;Wheelchair mobility training;DME instruction    PT Goals (Current goals can be found in the Care Plan section)  Acute Rehab PT Goals Patient Stated Goal: to take my wife on a vacation PT Goal Formulation: With patient Time For Goal Achievement: 07/12/16 Potential to Achieve Goals: Good    Frequency Min 5X/week   Barriers to  discharge        Co-evaluation PT/OT/SLP Co-Evaluation/Treatment: Yes Reason for Co-Treatment: Complexity of the patient's impairments (multi-system involvement);To address functional/ADL transfers PT goals addressed during session: Mobility/safety with mobility         End of Session Equipment Utilized During Treatment: Oxygen;Cervical collar Activity Tolerance: Patient tolerated treatment well Patient left: in bed;with call bell/phone within reach;with SCD's reapplied Nurse Communication: Mobility status;Need for lift equipment (sara ) PT Visit Diagnosis: Muscle weakness (generalized) (M62.81);Difficulty in walking, not elsewhere classified (R26.2);Pain         Time: 1457-1550 PT Time Calculation (min) (ACUTE ONLY): 53 min   Charges:   PT Evaluation $PT Eval Moderate Complexity: 1 Procedure     PT G Codes:         Noely Kuhnle A Polo Mcmartin 06/28/2016, 4:12 PM Wray Kearns, [place], DPT [phone]

## 2016-06-28 NOTE — Progress Notes (Signed)
OT Cancellation Note  Patient Details Name: Rodney Crane MRN: [id number] DOB: Jan 18, 1951   Cancelled Treatment:    Reason Eval/Treat Not Completed: Patient not medically ready.  Pt continues on Vent.  Will initiate OT eval when medically ready.   Neidra Girvan [place], OTR/L [id number]   Lucille Passy M 06/28/2016, 9:45 AM

## 2016-06-28 NOTE — Care Management Note (Signed)
Case Management Note  Patient Details  Name: Rodney Crane MRN: [id number] Date of Birth: 04-Nov-1950  Subjective/Objective:  Pt admitted on 06/25/16 with central cord syndrome s/p C2-5 posterior cervical fusion and C2-4 decompression.  PTA, pt resided at home with spouse.  He had become quite debilitated at home, requiring wheelchair use.                    Action/Plan: PT/OT recommending CIR; recommend rehab consult order.    Expected Discharge Date:                  Expected Discharge Plan:  [place]  In-House Referral:     Discharge planning Services  CM Consult  Post Acute Care Choice:    Choice offered to:     DME Arranged:    DME Agency:     HH Arranged:    [place] Agency:     Status of Service:  In process, will continue to follow  If discussed at Long Length of Stay Meetings, dates discussed:    Additional Comments:  Reinaldo Raddle, RN, BSN  Trauma/Neuro ICU Case Manager [phone]

## 2016-06-28 NOTE — Progress Notes (Signed)
eLink Physician-Brief Progress Note Patient Name: Rodney Crane DOB: [birth year] MRN: [id number]   Date of Service  06/28/2016  HPI/Events of Note  Needs noc cpap  eICU Interventions       Intervention Category Major Interventions: Hypoxemia - evaluation and management  Raylene Miyamoto. 06/28/2016, 6:28 PM

## 2016-06-28 NOTE — Anesthesia Postprocedure Evaluation (Addendum)
Anesthesia Post Note  Patient: Rodney Crane  Procedure(s) Performed: Procedure(s) (LRB): POSTERIOR CERVICAL FUSION C2-C5; C2-C4 Decompression (N/A)  Patient location during evaluation: SICU Anesthesia Type: General Level of consciousness: sedated Pain management: pain level controlled Vital Signs Assessment: post-procedure vital signs reviewed and stable Respiratory status: patient remains intubated per anesthesia plan Cardiovascular status: stable Anesthetic complications: no       Last Vitals:  Vitals:   06/28/16 0800 06/28/16 0900  BP: (!) 143/72 (!) 146/72  Pulse: 78 76  Resp: 15 16  Temp: 36.2 C     Last Pain:  Vitals:   06/28/16 0800  TempSrc: Axillary  PainSc: 0-No pain                 Effie Berkshire

## 2016-06-28 NOTE — Procedures (Signed)
Extubation Procedure Note  Patient Details:   Name: [name] DOB: 03-07-1951 MRN: [id number]   Airway Documentation:     Evaluation  O2 sats: stable throughout Complications: No apparent complications Patient did tolerate procedure well. Bilateral Breath Sounds: Clear, Diminished   Yes   Positive cuff leak noted.  Pt placed on nasal cannula 4 Lpm with humidity, no stridor noted, pt able to reach 1500 using incentive spirometer.  Bayard Beaver 06/28/2016, 11:43 AM

## 2016-06-28 NOTE — Progress Notes (Signed)
80 cc fentanyl wasted in sink. [place], RN witnessed wastage.

## 2016-06-28 NOTE — Progress Notes (Addendum)
    Subjective: Procedure(s) (LRB): POSTERIOR CERVICAL FUSION C2-C5; C2-C4 Decompression (N/A) 1 Day Post-Op  Intunated/sedated  Reports deferred at this time arm pain deferred incisional neck pain due to sedation N/A void - foley in place Negative bowel movement Negative flatus N/A chest pain or shortness of breath  Objective: Vital signs in last 24 hours: Temp:  [97.3 F (36.3 C)-98.7 F (37.1 C)] 97.3 F (36.3 C) (03/06 0400) Pulse Rate:  [60-88] 63 (03/05 2200) Resp:  [11-17] 14 (03/06 0700) BP: (88-136)/(53-75) 102/55 (03/06 0700) SpO2:  [100 %] 100 % (03/06 0332) Arterial Line BP: (84-168)/(45-81) 126/52 (03/06 0700) FiO2 (%):  [40 %-50 %] 40 % (03/06 0332)  Intake/Output from previous day: 03/05 0701 - 03/06 0700 In: 8583.5 [I.V.:7063.5; Blood:670; IV Piggyback:850] Out: 2760 [Urine:2265; Drains:145; Blood:350]  Labs:  Recent Labs  06/25/16 1635  06/27/16 1409 06/28/16 0447  WBC 8.0  --   --  10.1  RBC 4.22  --   --  3.52*  HCT 37.6*  < > 26.0* 30.6*  PLT 307  --   --  201  < > = values in this interval not displayed.  Recent Labs  06/25/16 1635  06/27/16 1409 06/28/16 0447  NA 137  < > 138 138  K 4.4  < > 4.3 4.4  CL 106  --   --  107  CO2 26  --   --  23  BUN 19  --   --  16  CREATININE 1.15  --   --  0.97  GLUCOSE 116*  --  179* 131*  CALCIUM 9.4  --   --  8.5*  < > = values in this interval not displayed. No results for input(s): LABPT, INR in the last 72 hours.  Physical Exam: ABD soft Incision: dressing C/D/I Compartment soft drain: 145 total  Assessment/Plan: Patient stable  xrays pending Mobilization with physical therapy Encourage incentive spirometry Continue care  1. EKG pending 2. Ween to extubation per critical care team 3. Re-evaluate neuro exam after extubation   Melina Schools, MD [place] [phone]   Extubated today.  No SOB/CP EKG - NSR Improved motion and sensation compared to pre-op I am  very excited about his progress.  Will transfer to floor tomorrow and start PT.   Will look into CIR as well Will address lumbar spinal stenosis when I return on Sunday I will be out of town 3/9-3/11.   Start oral pain medications

## 2016-06-28 NOTE — Progress Notes (Signed)
Pt still on vent. Defer medical management to critical care team while intubated.  Sonya Gunnoe, [employer]

## 2016-06-29 ENCOUNTER — Encounter (HOSPITAL_COMMUNITY): Payer: Self-pay | Admitting: Orthopedic Surgery

## 2016-06-29 ENCOUNTER — Inpatient Hospital Stay (HOSPITAL_COMMUNITY): Payer: Medicare Other

## 2016-06-29 DIAGNOSIS — S14129A Central cord syndrome at unspecified level of cervical spinal cord, initial encounter: Secondary | ICD-10-CM

## 2016-06-29 DIAGNOSIS — M48061 Spinal stenosis, lumbar region without neurogenic claudication: Secondary | ICD-10-CM

## 2016-06-29 DIAGNOSIS — Z9989 Dependence on other enabling machines and devices: Secondary | ICD-10-CM

## 2016-06-29 DIAGNOSIS — R000 Tachycardia, unspecified: Secondary | ICD-10-CM

## 2016-06-29 DIAGNOSIS — I10 Essential (primary) hypertension: Secondary | ICD-10-CM

## 2016-06-29 DIAGNOSIS — I5189 Other ill-defined heart diseases: Secondary | ICD-10-CM

## 2016-06-29 DIAGNOSIS — G959 Disease of spinal cord, unspecified: Secondary | ICD-10-CM

## 2016-06-29 DIAGNOSIS — Z8673 Personal history of transient ischemic attack (TIA), and cerebral infarction without residual deficits: Secondary | ICD-10-CM

## 2016-06-29 DIAGNOSIS — G4733 Obstructive sleep apnea (adult) (pediatric): Secondary | ICD-10-CM

## 2016-06-29 DIAGNOSIS — Z419 Encounter for procedure for purposes other than remedying health state, unspecified: Secondary | ICD-10-CM

## 2016-06-29 DIAGNOSIS — G8918 Other acute postprocedural pain: Secondary | ICD-10-CM

## 2016-06-29 DIAGNOSIS — D72829 Elevated white blood cell count, unspecified: Secondary | ICD-10-CM

## 2016-06-29 DIAGNOSIS — S14129D Central cord syndrome at unspecified level of cervical spinal cord, subsequent encounter: Secondary | ICD-10-CM

## 2016-06-29 DIAGNOSIS — Z720 Tobacco use: Secondary | ICD-10-CM

## 2016-06-29 DIAGNOSIS — Z9889 Other specified postprocedural states: Secondary | ICD-10-CM

## 2016-06-29 DIAGNOSIS — D62 Acute posthemorrhagic anemia: Secondary | ICD-10-CM

## 2016-06-29 DIAGNOSIS — I519 Heart disease, unspecified: Secondary | ICD-10-CM

## 2016-06-29 DIAGNOSIS — R29898 Other symptoms and signs involving the musculoskeletal system: Secondary | ICD-10-CM

## 2016-06-29 LAB — CBC
HCT: 33.9 % — ABNORMAL LOW (ref 39.0–52.0)
HEMOGLOBIN: 11.4 g/dL — AB (ref 13.0–17.0)
MCH: 29.7 pg (ref 26.0–34.0)
MCHC: 33.6 g/dL (ref 30.0–36.0)
MCV: 88.3 fL (ref 78.0–100.0)
Platelets: 209 10*3/uL (ref 150–400)
RBC: 3.84 MIL/uL — ABNORMAL LOW (ref 4.22–5.81)
RDW: 14 % (ref 11.5–15.5)
WBC: 12.5 10*3/uL — AB (ref 4.0–10.5)

## 2016-06-29 LAB — GLUCOSE, CAPILLARY
GLUCOSE-CAPILLARY: 113 mg/dL — AB (ref 65–99)
GLUCOSE-CAPILLARY: 142 mg/dL — AB (ref 65–99)
Glucose-Capillary: 140 mg/dL — ABNORMAL HIGH (ref 65–99)
Glucose-Capillary: 150 mg/dL — ABNORMAL HIGH (ref 65–99)
Glucose-Capillary: 130 mg/dL — ABNORMAL HIGH (ref 65–99)
Glucose-Capillary: 122 mg/dL — ABNORMAL HIGH (ref 65–99)

## 2016-06-29 LAB — BASIC METABOLIC PANEL
ANION GAP: 8 (ref 5–15)
BUN: 14 mg/dL (ref 6–20)
CALCIUM: 8.5 mg/dL — AB (ref 8.9–10.3)
CHLORIDE: 105 mmol/L (ref 101–111)
CO2: 22 mmol/L (ref 22–32)
Creatinine, Ser: 0.9 mg/dL (ref 0.61–1.24)
GFR calc non Af Amer: >60 mL/min (ref >60)
GLUCOSE: 150 mg/dL — AB (ref 65–99)
Potassium: 3.9 mmol/L (ref 3.5–5.1)
Sodium: 135 mmol/L (ref 135–145)

## 2016-06-29 MED ORDER — INSULIN ASPART 100 UNIT/ML ~~LOC~~ SOLN
0.0000 [IU] | Freq: Three times a day (TID) | SUBCUTANEOUS | Status: DC
  Administered 2016-06-29 – 2016-06-30 (×2): 3 [IU] via SUBCUTANEOUS
  Administered 2016-06-30: 4 [IU] via SUBCUTANEOUS
  Administered 2016-06-30 (×2): 3 [IU] via SUBCUTANEOUS
  Administered 2016-07-01 – 2016-07-02 (×5): 4 [IU] via SUBCUTANEOUS
  Administered 2016-07-02 (×2): 3 [IU] via SUBCUTANEOUS
  Administered 2016-07-02: 4 [IU] via SUBCUTANEOUS
  Administered 2016-07-03: 7 [IU] via SUBCUTANEOUS
  Administered 2016-07-03: 4 [IU] via SUBCUTANEOUS
  Administered 2016-07-03 – 2016-07-04 (×3): 3 [IU] via SUBCUTANEOUS

## 2016-06-29 MED ORDER — IRBESARTAN 150 MG PO TABS: 300.0000 mg | ORAL_TABLET | Freq: Every day | ORAL | Status: DC

## 2016-06-29 NOTE — Progress Notes (Signed)
PROGRESS NOTE    Rodney Crane  QQI:[id number] DOB: June 03, 1950 DOA: 06/25/2016 PCP: Marton Redwood, MD   Brief Narrative: Rodney Crane is a 66 y.o. male with a history of CVA, diabetes mellitus, hypertension, OSA on CPAP. He presented with weakness of his legs and arms and was found to have cervical stenosis of the renal canal. Status post decompression and lesion on 06/26/2016.   Assessment & Plan:   Principal Problem:   Cervical stenosis of spinal canal Active Problems:   Essential hypertension   Weakness of extremity   OSA (obstructive sleep apnea)   Hypertension   Diabetes ([place])   Spinal stenosis in cervical region   Acute hypoxemic respiratory failure (HCC)   Cervical stenosis spinal canal Patient is status post decompression C2-C4 and fusion C2-C5 on 06/26/2016 -Orthopedic surgery recommendations -CIR for disposition  Essential hypertension Mildly uncontrolled -Continue labetalol when necessary IV -Restart ARB  Obstructive sleep apnea -Continue CPAP  Diabetes mellitus Hemoglobin A1C of 6 -continue SSI  DVT prophylaxis: SCDs Code Status: Full code  Family Communication: None at bedside  Disposition Plan: Discharge CIR when available    Consultants:   Orthopedic surgery (Dr. Rolena Infante)  Procedures:  Decompression of C2-C4  Fusion of C2-C5  Antimicrobials:  None    Subjective: Patient reports some improvement in his strength. No chest pain or dyspnea  Objective: Vitals:   06/29/16 0827 06/29/16 0900 06/29/16 1000 06/29/16 1200  BP:  (!) 149/98 137/71   Pulse:  (!) 101 100   Resp:  20 16   Temp: 97.9 F (36.6 C)   99.2 F (37.3 C)  TempSrc: Oral   Oral  SpO2:  97% 100%   Weight:      Height:        Intake/Output Summary (Last 24 hours) at 06/29/16 1455 Last data filed at 06/29/16 1300  Gross per 24 hour  Intake             2878 ml  Output             3030 ml  Net             -152 ml   Filed Weights   06/25/16 1301 06/25/16 2022  06/27/16 0500  Weight: 127 kg (280 lb) 133 kg (293 lb 3.4 oz) 132.7 kg (292 lb 8.8 oz)    Examination:  General exam: Appears calm and comfortable Respiratory system: Clear to auscultation. Respiratory effort normal. Cardiovascular system: S1 & S2 heard, RRR. No murmurs, rubs, gallops or clicks. Gastrointestinal system: Abdomen is nondistended, soft and nontender. Normal bowel sounds heard. Central nervous system: Alert and oriented. 4/5 UE strength vs 3/5 LE strength. Diminished sensation of RLE Extremities: No edema. No calf tenderness Skin: No cyanosis. No rashes Psychiatry: Judgement and insight appear normal. Mood & affect appropriate.     Data Reviewed: I have personally reviewed following labs and imaging studies  CBC:  Recent Labs Lab 06/25/16 1635 06/27/16 1143 06/27/16 1314 06/27/16 1409 06/28/16 0447 06/29/16 0436  WBC 8.0  --   --   --  10.1 12.5*  NEUTROABS 3.7  --   --   --   --   --   HGB 12.5* 9.9* 6.5* 8.8* 10.5* 11.4*  HCT 37.6* 29.0* 19.0* 26.0* 30.6* 33.9*  MCV 89.1  --   --   --  86.9 88.3  PLT 307  --   --   --  201 941   Basic Metabolic Panel:  Recent Labs  Lab 06/25/16 1635 06/27/16 1143 06/27/16 1314 06/27/16 1409 06/28/16 0447 06/29/16 0436  NA 137 139 136 138 138 135  K 4.4 3.8 4.7 4.3 4.4 3.9  CL 106  --   --   --  107 105  CO2 26  --   --   --  23 22  GLUCOSE 116*  --   --  179* 131* 150*  BUN 19  --   --   --  16 14  CREATININE 1.15  --   --   --  0.97 0.90  CALCIUM 9.4  --   --   --  8.5* 8.5*   GFR: Estimated Creatinine Clearance: 115.3 mL/min (by C-G formula based on SCr of 0.9 mg/dL). Liver Function Tests:  Recent Labs Lab 06/25/16 1635  AST 24  ALT 27  ALKPHOS 99  BILITOT 0.8  PROT 7.3  ALBUMIN 3.7   No results for input(s): LIPASE, AMYLASE in the last 168 hours. No results for input(s): AMMONIA in the last 168 hours. Coagulation Profile: No results for input(s): INR, PROTIME in the last 168 hours. Cardiac  Enzymes:  Recent Labs Lab 06/28/16 0826 06/28/16 1412 06/28/16 2014  TROPONINI <0.03 <0.03 <0.03   BNP (last 3 results) No results for input(s): PROBNP in the last 8760 hours. HbA1C:  Recent Labs  06/27/16 1640  HGBA1C 6.0*   CBG:  Recent Labs Lab 06/28/16 1944 06/28/16 2318 06/29/16 0334 06/29/16 0824 06/29/16 1210  GLUCAP 121* 160* 140* 142* 150*   Lipid Profile:  Recent Labs  06/27/16 1717  TRIG 100   Thyroid Function Tests: No results for input(s): TSH, T4TOTAL, FREET4, T3FREE, THYROIDAB in the last 72 hours. Anemia Panel: No results for input(s): VITAMINB12, FOLATE, FERRITIN, TIBC, IRON, RETICCTPCT in the last 72 hours. Sepsis Labs: No results for input(s): PROCALCITON, LATICACIDVEN in the last 168 hours.  Recent Results (from the past 240 hour(s))  MRSA PCR Screening     Status: None   Collection Time: 06/27/16  4:40 PM  Result Value Ref Range Status   MRSA by PCR NEGATIVE NEGATIVE Final    Comment:        The GeneXpert MRSA Assay (FDA approved for NASAL specimens only), is one component of a comprehensive MRSA colonization surveillance program. It is not intended to diagnose MRSA infection nor to guide or monitor treatment for MRSA infections.          Radiology Studies: Dg Chest Port 1 View  Result Date: 06/29/2016 CLINICAL DATA:  Respiratory failure EXAM: PORTABLE CHEST 1 VIEW COMPARISON:  06/27/2016 FINDINGS: Cardiac shadow is not enlarged and accentuated by the portable technique. Postsurgical changes in the cervical spine are again seen. The lungs are well aerated bilaterally with mild bibasilar atelectatic changes. No focal infiltrate or sizable effusion is seen. No bony abnormality is noted. IMPRESSION: No change from the prior exam. Electronically Signed   By: Inez Catalina M.D.   On: 06/29/2016 07:36   Dg Chest Port 1 View  Result Date: 06/27/2016 CLINICAL DATA:  Endotracheal tube placement EXAM: PORTABLE CHEST 1 VIEW COMPARISON:   06/25/2016 FINDINGS: At 1703 hours. Endotracheal tube tip is 4.8 cm above the base of the carina. Lung volumes are low. No pulmonary edema or focal airspace consolidation. Probable atelectasis left lung base. Cardiopericardial silhouette is at upper limits of normal for size. The visualized bony structures of the thorax are intact. Cervical fusion hardware evident with probable surgical drains overlying the lower neck. IMPRESSION: Endotracheal tube  tip 4.8 cm above the base of the carina. Low volumes with left base atelectasis. Electronically Signed   By: Misty Stanley M.D.   On: 06/27/2016 17:13        Scheduled Meds: . insulin aspart  0-20 Units Subcutaneous Q4H  . pantoprazole (PROTONIX) IV  40 mg Intravenous Q24H  . sodium chloride flush  3 mL Intravenous Q12H   Continuous Infusions: . sodium chloride    . lactated ringers 85 mL/hr at 06/29/16 1200     LOS: 4 days     Cordelia Poche [facility] 06/29/2016, 2:55 PM Pager: [phone]  If 7PM-7AM, please contact night-coverage [email or web address] Password [email or web address] 06/29/2016, 2:55 PM

## 2016-06-29 NOTE — Progress Notes (Signed)
Occupational Therapy Treatment Patient Details Name: Rodney Crane MRN: [id number] DOB: 05-05-1950 Today's Date: 06/29/2016    History of present illness Patient is a 66 y/o male CVA, HTN, HLD, DM, smoker, OSA on CPAP presents with central cord syndrome s/p C2-5 posterior cervical fusion and C2-4 decompression. with compromised airway after surgery., extubated 3/6.   OT comments  Session focused on self feeding due to PT requesting OT evaluation ability to self feed at this time. Pt noted to have a cough with each attempt at swallowing. Pt provided red / blue foam for handle of utensils and positioned in 90 degree angle. Pt without cough and reports ease with swallow compared to prior attempts.    Follow Up Recommendations  CIR    Equipment Recommendations  Tub/shower bench    Recommendations for Other Services Rehab consult    Precautions / Restrictions Precautions Precautions: Cervical;Fall Precaution Comments: Hemovac Required Braces or Orthoses: Cervical Brace Cervical Brace: Hard collar;At all times       Mobility Bed Mobility                  Transfers                      Balance                                   ADL Overall ADL's : Needs assistance/impaired Eating/Feeding: Set up;Sitting Eating/Feeding Details (indicate cue type and reason): pt provided red and blue foam for meal. pt able to self feed with adjustment to [facility] increase, tray positioning and L UE supported on bedside tray at elbow. pt states incr discomfor with head increase but noted less coughing with meal. pt states "i can swallow better like this but its not as comfortable. " pt noted to have cough several times attempting to have BIL LE up and chair slighly reclined with self feeding                                   General ADL Comments: tech present in the room and to assist with bil Le elevated after meal completion. pt self feeding with L UE        Vision                     Perception     Praxis      Cognition   Behavior During Therapy: WFL for tasks assessed/performed Overall Cognitive Status: Within Functional Limits for tasks assessed                         Exercises     Shoulder Instructions       General Comments      Pertinent Vitals/ Pain       Pain Assessment: 0-10 Pain Score: 6  Pain Location: Back Pain Descriptors / Indicators: Aching;Grimacing Pain Intervention(s): Limited activity within patient's tolerance;Monitored during session;Premedicated before session;Repositioned  Home Living                                          Prior Functioning/Environment              Frequency  Min 3X/week  Progress Toward Goals  OT Goals(current goals can now be found in the care plan section)  Progress towards OT goals: Progressing toward goals  Acute Rehab OT Goals Patient Stated Goal: to take my wife on a vacation OT Goal Formulation: With patient Time For Goal Achievement: 07/12/16 Potential to Achieve Goals: Good ADL Goals Pt Will Perform Eating: with min assist;with adaptive utensils;sitting;bed level Pt Will Perform Grooming: with min assist;with adaptive equipment;sitting;bed level Pt Will Perform Upper Body Bathing: with mod assist;sitting;bed level Pt Will Transfer to Toilet: with max assist;with +2 assist;stand pivot transfer;bedside commode Pt/caregiver will Perform Home Exercise Program: Increased ROM;Increased strength;Right Upper extremity;Left upper extremity;With minimal assist  Plan Discharge plan remains appropriate    Co-evaluation                 End of Session Equipment Utilized During Treatment: Cervical collar  OT Visit Diagnosis: Muscle weakness (generalized) (M62.81)   Activity Tolerance Patient tolerated treatment well   Patient Left with call bell/phone within reach;in chair   Nurse Communication Mobility  status        Time: 6579-0383 OT Time Calculation (min): 11 min  Charges: OT General Charges $OT Visit: 1 Procedure OT Treatments $Self Care/Home Management : 8-22 mins   Jeri Modena   OTR/L Pager: [phone] Office: [phone] .    Parke Poisson B 06/29/2016, 3:16 PM

## 2016-06-29 NOTE — Progress Notes (Signed)
PCCM Consult Note  Admission date: 06/25/2016 Consult date: 06/27/2016 Referring provider: Dr. Rolena Infante, Ortho  CC: Back pain  HPI: Hx from chart and medical team.  66 yo male smoker had fall about 1 month prior to admission and had persistent back pain.  He also had difficulty moving his arms.  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   Vital signs: BP (!) 168/87   Pulse 90   Temp 97.9 F (36.6 C) (Oral)   Resp 17   Ht 6\' 1"  (1.854 m)   Wt 292 lb 8.8 oz (132.7 kg)   SpO2 100%   BMI 38.60 kg/m   Intake/output: I/O last 3 completed shifts: In: 5115 [P.O.:120; I.V.:3465; Other:1430; IV Piggyback:100] Out: 3605 [Urine:3515; Drains:90]  General: awake and alert Neuro: Central cord syndrome better HEENT: Aspen collar on Cardiac: regular, no murmur Chest: no wheeze Abd: soft, non tender, obese Ext: no edema Skin: no rashes   CMP Latest Ref Rng & Units 06/29/2016 06/28/2016 06/27/2016  Glucose 65 - 99 mg/dL 150(H) 131(H) 179(H)  BUN 6 - 20 mg/dL 14 16 -  Creatinine 0.61 - 1.24 mg/dL 0.90 0.97 -  Sodium 135 - 145 mmol/L 135 138 138  Potassium 3.5 - 5.1 mmol/L 3.9 4.4 4.3  Chloride 101 - 111 mmol/L 105 107 -  CO2 22 - 32 mmol/L 22 23 -  Calcium 8.9 - 10.3 mg/dL 8.5(L) 8.5(L) -  Total Protein 6.5 - 8.1 g/dL - - -  Total Bilirubin 0.3 - 1.2 mg/dL - - -  Alkaline Phos 38 - 126 U/L - - -  AST 15 - 41 U/L - - -  ALT 17 - 63 U/L - - -     CBC Latest Ref Rng & Units 06/29/2016 06/28/2016 06/27/2016  WBC 4.0 - 10.5 K/uL 12.5(H) 10.1 -  Hemoglobin 13.0 - 17.0 g/dL 11.4(L) 10.5(L) 8.8(L)  Hematocrit 39.0 - 52.0 % 33.9(L) 30.6(L) 26.0(L)  Platelets 150 - 400 K/uL 209 201 -     ABG    Component Value Date/Time   PHART 7.377 06/27/2016 1638   PCO2ART 38.1 06/27/2016 1638   PO2ART 183 (H) 06/27/2016 1638   HCO3 22.0 06/27/2016 1638   TCO2 28 06/27/2016 1314   ACIDBASEDEF 2.5 (H) 06/27/2016 1638   O2SAT 99.2 06/27/2016 1638     CBG (last 3)   Recent Labs  06/28/16 2318  06/29/16 0334 06/29/16 0824  GLUCAP 160* 140* 142*     Imaging: Dg Cervical Spine 2-3 Views  Result Date: 06/27/2016 CLINICAL DATA:  C2-7 posterior cervical fusion. EXAM: CERVICAL SPINE - 2-3 VIEW; DG C-ARM 61-120 MIN COMPARISON:  MRI 06/26/2016. FLUOROSCOPY TIME: C-arm fluoroscopic images were obtained intraoperatively and submitted for post operative interpretation. Please see the performing provider's procedural report for the fluoroscopy time utilized. FINDINGS: Four spot fluoroscopic images are submitted from the operating room. These demonstrate a pre-existing anterior plate and screws extending inferiorly from C4. The inferior extent of the hardware is not visualized in the lateral projection, although based on the frontal examination and previous MRI and, extends from C4 through C7. There are new posterior pedicle screws and rods extending from C2 through C6. The alignment is normal. No perioperative complications are identified. IMPRESSION: Intraoperative views during revision of cervical fusion with posterior components extending from C2 through C6. No demonstrated complication. Electronically Signed   By: Richardean Sale M.D.   On: 06/27/2016 15:12   Dg Chest Port 1 View  Result Date: 06/29/2016 CLINICAL DATA:  Respiratory failure EXAM: PORTABLE CHEST 1 VIEW COMPARISON:  06/27/2016 FINDINGS: Cardiac shadow is not enlarged and accentuated by the portable technique. Postsurgical changes in the cervical spine are again seen. The lungs are well aerated bilaterally with mild bibasilar atelectatic changes. No focal infiltrate or sizable effusion is seen. No bony abnormality is noted. IMPRESSION: No change from the prior exam. Electronically Signed   By: Inez Catalina M.D.   On: 06/29/2016 07:36   Dg Chest Port 1 View  Result Date: 06/27/2016 CLINICAL DATA:  Endotracheal tube placement EXAM: PORTABLE CHEST 1 VIEW COMPARISON:  06/25/2016 FINDINGS: At 1703 hours. Endotracheal tube tip is 4.8 cm above  the base of the carina. Lung volumes are low. No pulmonary edema or focal airspace consolidation. Probable atelectasis left lung base. Cardiopericardial silhouette is at upper limits of normal for size. The visualized bony structures of the thorax are intact. Cervical fusion hardware evident with probable surgical drains overlying the lower neck. IMPRESSION: Endotracheal tube tip 4.8 cm above the base of the carina. Low volumes with left base atelectasis. Electronically Signed   By: Misty Stanley M.D.   On: 06/27/2016 17:13   Dg C-arm 61-120 Min  Result Date: 06/27/2016 CLINICAL DATA:  C2-7 posterior cervical fusion. EXAM: CERVICAL SPINE - 2-3 VIEW; DG C-ARM 61-120 MIN COMPARISON:  MRI 06/26/2016. FLUOROSCOPY TIME: C-arm fluoroscopic images were obtained intraoperatively and submitted for post operative interpretation. Please see the performing provider's procedural report for the fluoroscopy time utilized. FINDINGS: Four spot fluoroscopic images are submitted from the operating room. These demonstrate a pre-existing anterior plate and screws extending inferiorly from C4. The inferior extent of the hardware is not visualized in the lateral projection, although based on the frontal examination and previous MRI and, extends from C4 through C7. There are new posterior pedicle screws and rods extending from C2 through C6. The alignment is normal. No perioperative complications are identified. IMPRESSION: Intraoperative views during revision of cervical fusion with posterior components extending from C2 through C6. No demonstrated complication. Electronically Signed   By: Richardean Sale M.D.   On: 06/27/2016 15:12     Studies: Echo 3/04 >> mod LVH, EF 60 to 65%, grade 1 DD  Antibiotics: Ancef 3/05 >>  Cultures:  Lines/tubes: ETT 3/05 >>3/6 Aline 3/5>>3/6  Events: 3/03 Admit 3/05 C spine decompression and fusion. Some question of ekg changes in OR , not documented 3/6 heavily sedated, will decrease  for extubation 3/6 extubated without complications  Assessment/plan:  ? EKG changes during OR with no abnormal EKGs noted 3/6 -recheck 12 lead-neg -check trop -  Central cord syndrome s/p C2-4 decompression with C2-C5 fusion. - post op care per ortho - RASS goal 0  Compromised airway after C spine surgery. Tobacco abuse. - f/u CXR, ABG - full vent support till fully awake and negative cuff leak test. NIMVS nocturnal and PRN. Note multilevel cervical fusion current and in past = difficult airway. -check 12 lead prior to extubation - smoking cessation  Hx of OSA.   - CPAP Qhs  Hx of HTN, HLD. - prn labetalol IV  - hold outpt lipitor, toprol, diovan until able to take pills  Hx of CVA. - hold outpt plavix for now  Hx of DM. CBG (last 3)   Recent Labs  06/28/16 2318 06/29/16 0334 06/29/16 0824  GLUCAP 160* 140* 142*     - SSI - hold outpt metformin  DVT prophylaxis - SCDs SUP - Protonix Nutrition - NPO Goals of care -  full code  D/w Dr. Rolena Infante. Ok to wean to extubation form ortho standpoint  3/6  No family at bedside  3/7 PCCM will sign off, please call if needed.  Richardson Landry Minor ACNP Maryanna Shape PCCM Pager [phone] till 3 pm If no answer page [phone] 06/29/2016, 8:45 AM    ATTENDING NOTE / ATTESTATION NOTE :   I have discussed the case with the resident/APP  Richardson Landry Minor NP.   I agree with the resident/APP's  history, physical examination, assessment, and plans.    I have edited the above note and modified it according to our agreed history, physical examination, assessment and plan.   Briefly, pt with central cord syndrome s/p C2-4 decompression with C2-C5 fusion. He was kept intubated post operatively as the OR was 8-9 hrs.  Pt with OSA, questionable compliance on cpap.  No issues since extubation on 3/6.   VSS. Comfortable. Sitting on chair. (-) issues.  Rest of PE as per above.   Continue Current management.  Use cpap for patient's OSA. Rest  per ortho team.  OK to transfer out of ICU from our [place].  PCCM will sign off on 3/8.  TRH Dr. Thereasa Solo will follow if with issues.     Monica Becton, MD 06/29/2016, 11:37 AM [facility] Pager [phone] After 3 pm or if no answer, call [phone]

## 2016-06-29 NOTE — Progress Notes (Signed)
Physical Therapy Treatment Patient Details Name: Rodney Crane MRN: [id number] DOB: Jul 30, 1950 Today's Date: 06/29/2016    History of Present Illness Patient is a 66 y/o male CVA, HTN, HLD, DM, smoker, OSA on CPAP presents with central cord syndrome s/p C2-5 posterior cervical fusion and C2-4 decompression. with compromised airway after surgery., extubated 3/6.    PT Comments    Pt very motivated and attempts to follow all directions.  Pt able to come to standing with use of stedy and 2 person A, however pt fatigues quickly and needs to sit on stedy seat quickly after coming to standing.  While seated in stedy pt with poor trunk strength and control requiring A to maintain upright seated position while on stedy.  Increased A for coming to standing from stedy due to fatigue.  Pt's HR up to max of 130s throughout mobility with BP and O2 remaining stable.  Continue to feel pt will need CIR level of therapies at D/C.     Follow Up Recommendations  CIR     Equipment Recommendations  None recommended by PT    Recommendations for Other Services       Precautions / Restrictions Precautions Precautions: Cervical;Fall Precaution Comments: Hemovac Required Braces or Orthoses: Cervical Brace Cervical Brace: Hard collar;At all times Restrictions Weight Bearing Restrictions: No    Mobility  Bed Mobility Overal bed mobility: Needs Assistance Bed Mobility: Rolling;Sidelying to Sit Rolling: Max assist;+2 for physical assistance Sidelying to sit: Max assist;+2 for physical assistance       General bed mobility comments: cues and hand over hand facilitation for log roll and coming to sitting.  pt does seem to give good effort.    Transfers Overall transfer level: Needs assistance   Transfers: Sit to/from Stand Sit to Stand: Max assist;+2 physical assistance         General transfer comment: Came to stand with use of stedy and height of bed elevated.  Utilized pad under hips to A with  more erect posture, however fatigues very quickly requiring stedy "seat flaps" to be placed quickly.  pt with poor trunk control and trunk weakness while sitting in stedy.    Ambulation/Gait                 Stairs            Wheelchair Mobility    Modified Rankin (Stroke Patients Only)       Balance Overall balance assessment: Needs assistance Sitting-balance support: No upper extremity supported;Feet supported Sitting balance-Leahy Scale: Poor Sitting balance - Comments: pt requires at a minimum guarding while sitting EOB due to poor trunk strength and control.     Standing balance support: Bilateral upper extremity supported;During functional activity Standing balance-Leahy Scale: Zero                      Cognition Arousal/Alertness: Awake/alert Behavior During Therapy: WFL for tasks assessed/performed Overall Cognitive Status: Within Functional Limits for tasks assessed                      Exercises      General Comments        Pertinent Vitals/Pain Pain Assessment: 0-10 Pain Score: 6  Pain Location: Back Pain Descriptors / Indicators: Aching;Grimacing Pain Intervention(s): Monitored during session;Premedicated before session;Repositioned    Home Living                      Prior Function  PT Goals (current goals can now be found in the care plan section) Acute Rehab PT Goals Patient Stated Goal: to take my wife on a vacation PT Goal Formulation: With patient Time For Goal Achievement: 07/12/16 Potential to Achieve Goals: Good Progress towards PT goals: Progressing toward goals    Frequency    Min 5X/week      PT Plan Current plan remains appropriate    Co-evaluation             End of Session Equipment Utilized During Treatment: Gait belt;Oxygen Activity Tolerance: Patient limited by fatigue Patient left: in chair;with call bell/phone within reach Nurse Communication: Mobility  status;Need for lift equipment PT Visit Diagnosis: Muscle weakness (generalized) (M62.81);Difficulty in walking, not elsewhere classified (R26.2);Pain Pain - part of body:  (Back)     Time: 2703-5009 PT Time Calculation (min) (ACUTE ONLY): 34 min  Charges:  $Therapeutic Activity: 23-37 mins                    G CodesCatarina Hartshorn, [place] [phone] 06/29/2016, 12:06 PM

## 2016-06-29 NOTE — Progress Notes (Addendum)
    Subjective: 2 Days Post-Op Procedure(s) (LRB): POSTERIOR CERVICAL FUSION C2-C5; C2-C4 Decompression (N/A) Patient reports pain as 5/10.  Pt says he has not had pain medication in awhile. Reports his pain has been improved during the day. Denies CP or SOB.  Foley just removed at 1pm.  Pt has not voided yet. Positive flatus. Pt reports standing with PT and sitting up in chair.   Pt reports improvement in extremity movement since surgery  Objective: Vital signs in last 24 hours: Temp:  [97.9 F (36.6 C)-99.2 F (37.3 C)] 99.2 F (37.3 C) (03/07 1200) Pulse Rate:  [90-115] 100 (03/07 1000) Resp:  [14-21] 16 (03/07 1000) BP: (137-169)/(67-118) 137/71 (03/07 1000) SpO2:  [86 %-100 %] 100 % (03/07 1000) Arterial Line BP: (86-88)/(78-79) 86/79 (03/06 1600)  Intake/Output from previous day: 03/06 0701 - 03/07 0700 In: 3590 [P.O.:120; I.V.:2040] Out: 2355 [Urine:2350; Drains:5] Intake/Output this shift: Total I/O In: 883 [P.O.:400; I.V.:428; IV Piggyback:55] Out: 675 [Urine:675]  Labs:  Recent Labs  06/27/16 1143 06/27/16 1314 06/27/16 1409 06/28/16 0447 06/29/16 0436  HGB 9.9* 6.5* 8.8* 10.5* 11.4*    Recent Labs  06/28/16 0447 06/29/16 0436  WBC 10.1 12.5*  RBC 3.52* 3.84*  HCT 30.6* 33.9*  PLT 201 209    Recent Labs  06/28/16 0447 06/29/16 0436  NA 138 135  K 4.4 3.9  CL 107 105  CO2 23 22  BUN 16 14  CREATININE 0.97 0.90  GLUCOSE 131* 150*  CALCIUM 8.5* 8.5*   No results for input(s): LABPT, INR in the last 72 hours.  Physical Exam: Neurologically intact ABD soft Sensation intact distally Incision: no drainage Compartment soft Pt can move left foot, limited movement with right foot Pt moving both arms and hand.   Extremity weakness present in all extremities  Assessment/Plan: 2 Days Post-Op Procedure(s) (LRB): POSTERIOR CERVICAL FUSION C2-C5; C2-C4 Decompression (N/A) Inpatient rehab has been recommended for this pt Waiting for Inpatient  rehab consult Lumbar surgery pending for next week Swallow study pending Continue liquid diet.   [place]o, Darla Lesches for Dr. Melina Schools [facility] [phone] 06/29/2016, 2:50 PM   Patient doing well Agree with CIR admission now. Will re-evaluate lumbar spinal stenosis next week to determine if surgery to be done now (during this admission) or delayed.    Patient ID: Rodney Crane, male   DOB: Apr 20, 1951, 66 y.o.   MRN: [id number]

## 2016-06-29 NOTE — Progress Notes (Signed)
Rehab Admissions Coordinator Note:  Patient was screened by Cleatrice Burke for appropriateness for an Inpatient Acute Rehab Consult per PT and OT recommendation. At this time, we are recommending Inpatient Rehab consult. Please place order for rehab consult.  Cleatrice Burke 06/29/2016, 8:26 AM  I can be reached at [phone].

## 2016-06-29 NOTE — Progress Notes (Signed)
I await definitive clarification of date of lumbar surgery next week. This will determine timing of admission to inpt rehab. I will follow up tomorrow. [phone]

## 2016-06-29 NOTE — Consult Note (Signed)
Physical Medicine and Rehabilitation Consult Reason for Consult: Central cord syndrome Referring Physician: Dr.Nettey   HPI: Rodney Crane is a 66 y.o. right handed male with history of diabetes mellitus, CVA 2010, hypertension, cervical discectomy 2004 and lumbar disc surgery and tobacco abuse. Per chart review and patient, patient lives with spouse. One level home. Patient reports he was independent until approximately one month ago and then with multiple falls and now using a rolling walker for short distance ambulation. Present 06/26/2016 with progressive back and leg pain as well as multiple falls followed as an outpatient by Dr. Rolena Infante. MRI reviewed, showing spinal stenosis C3-4 level as well as C2-3. Preoperative clearance echocardiogram with ejection fraction 83% grade 1 diastolic dysfunction. Underwent posterior cervical fusion C2-C5, C2-C4 decompression 06/26/2016 per Dr. Rolena Infante. Patient did require short-term intubation until 06/28/2016. Acute blood loss anemia and was transfused. Hospital course pain management. Hard cervical collar at all times. Patient with plans to ?undergo lumbar surgery as well.  Physical therapy evaluation completed 06/28/2016 with recommendations of physical medicine rehabilitation consult.   Review of Systems  Constitutional: Negative for chills and fever.  HENT: Negative for hearing loss.   Eyes: Negative for blurred vision and double vision.  Respiratory: Negative for cough and shortness of breath.   Cardiovascular: Positive for leg swelling. Negative for chest pain and palpitations.  Gastrointestinal: Positive for constipation. Negative for nausea.  Genitourinary: Positive for urgency. Negative for dysuria and hematuria.  Musculoskeletal: Positive for myalgias and neck pain.  Skin: Negative for rash.  Neurological: Positive for dizziness, sensory change, focal weakness and weakness. Negative for seizures.       Foot drop  All other systems  reviewed and are negative.  Past Medical History:  Diagnosis Date  . Arthritis   . Colon polyps   . CVA (cerebral infarction) 2010  . Diabetes mellitus   . Dizziness   . ED (erectile dysfunction)   . Foot drop   . H/O Bell's palsy   . Hyperlipidemia   . Hypertension   . Obesity   . OSA on CPAP   . Spinal stenosis   . Stroke ([place])   . Unsteady gait    Past Surgical History:  Procedure Laterality Date  . CARPAL TUNNEL RELEASE     bilateral  . CERVICAL DISCECTOMY  2004  . LUMBAR DISC SURGERY     x6   Family History  Problem Relation Age of Onset  . Multiple sclerosis Sister   . Dementia Mother   . Cancer Father   . Sleep apnea Brother   . Hypertension Brother   . Colon cancer Neg Hx   . Stomach cancer Neg Hx    Social History:  reports that he has been smoking Cigarettes.  He has never used smokeless tobacco. He reports that he does not drink alcohol or use drugs. Allergies: No Known Allergies Facility-Administered Medications Prior to Admission  Medication Dose Route Frequency Provider Last Rate Last Dose  . 0.9 %  sodium chloride infusion  500 mL Intravenous Continuous Irene Shipper, MD       Medications Prior to Admission  Medication Sig Dispense Refill  . atorvastatin (LIPITOR) 40 MG tablet Take 20 mg by mouth at bedtime.   7  . clopidogrel (PLAVIX) 75 MG tablet Take 75 mg by mouth daily.  6  . cyclobenzaprine (FLEXERIL) 10 MG tablet TAKE 1 TABLET BY MOUTH EVERY 8 HOURS AS NEEDED FOR MUSCLE SPASMS  1  .  metFORMIN (GLUCOPHAGE) 1000 MG tablet Take 1,000 mg by mouth 2 (two) times daily with a meal.    . metoprolol (TOPROL XL) 50 MG 24 hr tablet Take 50 mg by mouth daily.      . naproxen (NAPROSYN) 500 MG tablet Take 500 mg by mouth 2 (two) times daily. for pain  1  . oxyCODONE-acetaminophen (PERCOCET/ROXICET) 5-325 MG per tablet TAKE 1 TABLET BY MOUTH 3 TIMES DIALY AS NEEDED  0  . valsartan (DIOVAN) 320 MG tablet Take 160 mg by mouth daily.   5    Home: Home  Living Family/patient expects to be discharged to:: Private residence Living Arrangements: Spouse/significant other Available Help at Discharge: Family Type of Home: House Home Access: Stairs to enter [employer] of Steps: 1/2 step (porch stoop) Home Layout: One level Bathroom Shower/Tub: Chiropodist: Handicapped height [place]: Environmental consultant - 2 wheels, Bedside commode, Shower seat  Functional History: Prior Function Level of Independence: Needs assistance Gait / Transfers Assistance Needed: Pt reports that one month ago he was independent and moving well. Since then, he has had multiple falls at home; amb short distances with RW.  ADL's / Homemaking Assistance Needed: Requires assist for ADLs/IADLs Comments: Retired from work. Uses CPAP.  Functional Status:  Mobility: Bed Mobility Overal bed mobility: Needs Assistance Bed Mobility: Rolling, Sidelying to Sit, Sit to Sidelying Rolling: Max assist, +2 for physical assistance, +2 for safety/equipment Sidelying to sit: HOB elevated, Max assist, +2 for physical assistance Sit to sidelying: Max assist, +2 for physical assistance General bed mobility comments: Cues for log roll technique. Assist to roll with cues for sequencing, manage LEs and to elevate trunk. Assist to bring LEs into bed and to lower trunk to return to supine. Transfers Overall transfer level: Needs assistance Equipment used: 2 person hand held assist Transfers: Sit to/from Stand General transfer comment: Attempted sit to stand x3 with Max A of 2 with therapists supporting bil knees and providing cues for hip extension. Able to partially stand x1 and clear bottom x2 to help scoot bottom towards left in bed.      ADL: ADL Overall ADL's : Needs assistance/impaired Eating/Feeding: Maximal assistance, Bed level Grooming: Wash/dry hands, Wash/dry face, Minimal assistance, Bed level Upper Body Bathing: Total assistance, Bed level Lower  Body Bathing: Total assistance, Bed level Upper Body Dressing : Total assistance, Bed level Lower Body Dressing: Total assistance, Bed level Toilet Transfer: Total assistance Toilet Transfer Details (indicate cue type and reason): uanble to attempt  Toileting- Clothing Manipulation and Hygiene: Total assistance, Bed level Functional mobility during ADLs: +2 for physical assistance, Maximal assistance  Cognition: Cognition Overall Cognitive Status: Within Functional Limits for tasks assessed Orientation Level: Oriented X4 Cognition Arousal/Alertness: Awake/alert Behavior During Therapy: WFL for tasks assessed/performed Overall Cognitive Status: Within Functional Limits for tasks assessed  Blood pressure (!) 168/87, pulse 90, temperature 97.9 F (36.6 C), temperature source Oral, resp. rate 17, height 6\' 1"  (1.854 m), weight 132.7 kg (292 lb 8.8 oz), SpO2 100 %. Physical Exam  Vitals reviewed. Constitutional: He is oriented to person, place, and time. He appears well-developed.  Obese  HENT:  Head: Normocephalic.  Eyes: Conjunctivae and EOM are normal.  Neck:  Cervical collar in place  Cardiovascular: Normal rate and regular rhythm.   Respiratory: Effort normal and breath sounds normal. No respiratory distress.  GI: Soft. Bowel sounds are normal. He exhibits no distension.  Musculoskeletal: He exhibits edema. He exhibits no tenderness.  Neurological: He is  alert and oriented to person, place, and time.  Motor: RUE: Shoulder abduction 2/5, elbow flexion/extension 3+/5, wrist/hand 4/5 LUE: Shoulder abduction 4-/5, elbow flexion/extension 4/5, wrist/hand 4+/5 RLE: HF, KE 2-/5, ADF/PF 0/5 LLE: HF, KE 2+/5, ADF/PF 4/5 Sensation diminished to light touch in hands and feet DTRs symmetric  Skin: Skin is warm and dry.  Surgical site is dressed  Psychiatric: He has a normal mood and affect. His behavior is normal.    Results for orders placed or performed during the hospital encounter  of 06/25/16 (from the past 24 hour(s))  Glucose, capillary     Status: None   Collection Time: 06/28/16 12:07 PM  Result Value Ref Range   Glucose-Capillary 92 65 - 99 mg/dL   Comment 1 Notify RN    Comment 2 Document in Chart   Troponin I (q 6hr x 3)     Status: None   Collection Time: 06/28/16  2:12 PM  Result Value Ref Range   Troponin I <0.03 <0.03 ng/mL  Glucose, capillary     Status: Abnormal   Collection Time: 06/28/16  3:31 PM  Result Value Ref Range   Glucose-Capillary 113 (H) 65 - 99 mg/dL   Comment 1 Notify RN    Comment 2 Document in Chart   Glucose, capillary     Status: Abnormal   Collection Time: 06/28/16  7:44 PM  Result Value Ref Range   Glucose-Capillary 121 (H) 65 - 99 mg/dL  Troponin I (q 6hr x 3)     Status: None   Collection Time: 06/28/16  8:14 PM  Result Value Ref Range   Troponin I <0.03 <0.03 ng/mL  Glucose, capillary     Status: Abnormal   Collection Time: 06/28/16 11:18 PM  Result Value Ref Range   Glucose-Capillary 160 (H) 65 - 99 mg/dL  Glucose, capillary     Status: Abnormal   Collection Time: 06/29/16  3:34 AM  Result Value Ref Range   Glucose-Capillary 140 (H) 65 - 99 mg/dL  CBC     Status: Abnormal   Collection Time: 06/29/16  4:36 AM  Result Value Ref Range   WBC 12.5 (H) 4.0 - 10.5 K/uL   RBC 3.84 (L) 4.22 - 5.81 MIL/uL   Hemoglobin 11.4 (L) 13.0 - 17.0 g/dL   HCT 33.9 (L) 39.0 - 52.0 %   MCV 88.3 78.0 - 100.0 fL   MCH 29.7 26.0 - 34.0 pg   MCHC 33.6 30.0 - 36.0 g/dL   RDW 14.0 11.5 - 15.5 %   Platelets 209 150 - 400 K/uL  Basic metabolic panel     Status: Abnormal   Collection Time: 06/29/16  4:36 AM  Result Value Ref Range   Sodium 135 135 - 145 mmol/L   Potassium 3.9 3.5 - 5.1 mmol/L   Chloride 105 101 - 111 mmol/L   CO2 22 22 - 32 mmol/L   Glucose, Bld 150 (H) 65 - 99 mg/dL   BUN 14 6 - 20 mg/dL   Creatinine, Ser 0.90 0.61 - 1.24 mg/dL   Calcium 8.5 (L) 8.9 - 10.3 mg/dL   GFR calc non Af Amer >60 >60 mL/min   GFR calc Af  Amer >60 >60 mL/min   Anion gap 8 5 - 15  Glucose, capillary     Status: Abnormal   Collection Time: 06/29/16  8:24 AM  Result Value Ref Range   Glucose-Capillary 142 (H) 65 - 99 mg/dL   Comment 1 Notify RN  Comment 2 Document in Chart    Dg Cervical Spine 2-3 Views  Result Date: 06/27/2016 CLINICAL DATA:  C2-7 posterior cervical fusion. EXAM: CERVICAL SPINE - 2-3 VIEW; DG C-ARM 61-120 MIN COMPARISON:  MRI 06/26/2016. FLUOROSCOPY TIME: C-arm fluoroscopic images were obtained intraoperatively and submitted for post operative interpretation. Please see the performing provider's procedural report for the fluoroscopy time utilized. FINDINGS: Four spot fluoroscopic images are submitted from the operating room. These demonstrate a pre-existing anterior plate and screws extending inferiorly from C4. The inferior extent of the hardware is not visualized in the lateral projection, although based on the frontal examination and previous MRI and, extends from C4 through C7. There are new posterior pedicle screws and rods extending from C2 through C6. The alignment is normal. No perioperative complications are identified. IMPRESSION: Intraoperative views during revision of cervical fusion with posterior components extending from C2 through C6. No demonstrated complication. Electronically Signed   By: Richardean Sale M.D.   On: 06/27/2016 15:12   Dg Chest Port 1 View  Result Date: 06/29/2016 CLINICAL DATA:  Respiratory failure EXAM: PORTABLE CHEST 1 VIEW COMPARISON:  06/27/2016 FINDINGS: Cardiac shadow is not enlarged and accentuated by the portable technique. Postsurgical changes in the cervical spine are again seen. The lungs are well aerated bilaterally with mild bibasilar atelectatic changes. No focal infiltrate or sizable effusion is seen. No bony abnormality is noted. IMPRESSION: No change from the prior exam. Electronically Signed   By: Inez Catalina M.D.   On: 06/29/2016 07:36   Dg Chest Port 1  View  Result Date: 06/27/2016 CLINICAL DATA:  Endotracheal tube placement EXAM: PORTABLE CHEST 1 VIEW COMPARISON:  06/25/2016 FINDINGS: At 1703 hours. Endotracheal tube tip is 4.8 cm above the base of the carina. Lung volumes are low. No pulmonary edema or focal airspace consolidation. Probable atelectasis left lung base. Cardiopericardial silhouette is at upper limits of normal for size. The visualized bony structures of the thorax are intact. Cervical fusion hardware evident with probable surgical drains overlying the lower neck. IMPRESSION: Endotracheal tube tip 4.8 cm above the base of the carina. Low volumes with left base atelectasis. Electronically Signed   By: Misty Stanley M.D.   On: 06/27/2016 17:13   Dg C-arm 61-120 Min  Result Date: 06/27/2016 CLINICAL DATA:  C2-7 posterior cervical fusion. EXAM: CERVICAL SPINE - 2-3 VIEW; DG C-ARM 61-120 MIN COMPARISON:  MRI 06/26/2016. FLUOROSCOPY TIME: C-arm fluoroscopic images were obtained intraoperatively and submitted for post operative interpretation. Please see the performing provider's procedural report for the fluoroscopy time utilized. FINDINGS: Four spot fluoroscopic images are submitted from the operating room. These demonstrate a pre-existing anterior plate and screws extending inferiorly from C4. The inferior extent of the hardware is not visualized in the lateral projection, although based on the frontal examination and previous MRI and, extends from C4 through C7. There are new posterior pedicle screws and rods extending from C2 through C6. The alignment is normal. No perioperative complications are identified. IMPRESSION: Intraoperative views during revision of cervical fusion with posterior components extending from C2 through C6. No demonstrated complication. Electronically Signed   By: Richardean Sale M.D.   On: 06/27/2016 15:12    Assessment/Plan: Diagnosis: Central cord syndrome Labs and images independently reviewed.  Records reviewed  and summated above.  1. Does the need for close, 24 hr/day medical supervision in concert with the patient's rehab needs make it unreasonable for this patient to be served in a less intensive setting? Yes 2. Co-Morbidities requiring  supervision/potential complications: diabetes mellitus (Monitor in accordance with exercise and adjust meds as necessary), CVA 2010, HTN (monitor and provide prns in accordance with increased physical exertion and pain), cervical discectomy 2004 and lumbar disc surgery, tobacco abuse (counsel), diastolic dysfunction (monitor for signs/symptoms of fluid overload), Acute blood loss anemia (transfuse if necessary to ensure appropriate perfusion for increased activity tolerance), post-op pain (Biofeedback training with therapies to help reduce reliance on opiate pain medications, particularly IV morphine, monitor pain control during therapies, and sedation at rest and titrate to maximum efficacy to ensure participation and gains in therapies), Tachycardia (monitor in accordance with pain and increasing activity), leukocytosis (cont to monitor for signs and symptoms of infection, further workup if indicated), lumbar stenosis (?surgery next week), OSA (cont CPAP, monitor for daytime somnolence) 3. Due to bladder management, bowel management, safety, skin/wound care, disease management, medication administration, pain management and patient education, does the patient require 24 hr/day rehab nursing? Yes 4. Does the patient require coordinated care of a physician, rehab nurse, PT (1-2 hrs/day, 5 days/week) and OT (1-2 hrs/day, 5 days/week) to address physical and functional deficits in the context of the above medical diagnosis(es)? Yes Addressing deficits in the following areas: balance, endurance, locomotion, strength, transferring, bowel/bladder control, bathing, dressing, feeding, grooming, toileting and psychosocial support 5. Can the patient actively participate in an intensive  therapy program of at least 3 hrs of therapy per day at least 5 days per week? Yes 6. The potential for patient to make measurable gains while on inpatient rehab is excellent 7. Anticipated functional outcomes upon discharge from inpatient rehab are min assist  with PT, min assist with OT, n/a with SLP. 8. Estimated rehab length of stay to reach the above functional goals is: 14-17 days. 9. Does the patient have adequate social supports and living environment to accommodate these discharge functional goals? Potentially 10. Anticipated D/C setting: TBD 11. Anticipated post D/C treatments: TBD 12. Overall Rehab/Functional Prognosis: good  RECOMMENDATIONS: This patient's condition is appropriate for continued rehabilitative care in the following setting: Pt with plan for ?lumbar surgery next week.  This will change is functional status depending on the extent of the surgery.  Will cont to follow.  Pt will likely need signficant assistance at discharge. Patient has agreed to participate in recommended program. Potentially Note that insurance prior authorization may be required for reimbursement for recommended care.  Comment: Rehab Admissions Coordinator to follow up.  Delice Lesch, MD, Mellody Drown Cathlyn Parsons., PA-C 06/29/2016

## 2016-06-29 NOTE — Progress Notes (Signed)
Patient currently has c-collar on and is unable to get a seal on CPAP. CPAP is not being worn at this time.

## 2016-06-30 LAB — GLUCOSE, CAPILLARY
GLUCOSE-CAPILLARY: 112 mg/dL — AB (ref 65–99)
GLUCOSE-CAPILLARY: 146 mg/dL — AB (ref 65–99)
GLUCOSE-CAPILLARY: 142 mg/dL — AB (ref 65–99)
Glucose-Capillary: 138 mg/dL — ABNORMAL HIGH (ref 65–99)
Glucose-Capillary: 188 mg/dL — ABNORMAL HIGH (ref 65–99)

## 2016-06-30 LAB — TRIGLYCERIDES: Triglycerides: 76 mg/dL (ref <150)

## 2016-06-30 MED ORDER — IRBESARTAN 300 MG PO TABS
300.0000 mg | ORAL_TABLET | Freq: Every day | ORAL | Status: DC
  Administered 2016-06-30 – 2016-07-04 (×5): 300 mg via ORAL
  Filled 2016-06-30: qty 2
  Filled 2016-06-30: qty 1
  Filled 2016-06-30 (×2): qty 2
  Filled 2016-06-30: qty 1

## 2016-06-30 MED ORDER — LABETALOL HCL 5 MG/ML IV SOLN
10.0000 mg | INTRAVENOUS | Status: DC | PRN
  Administered 2016-06-30 – 2016-07-02 (×6): 10 mg via INTRAVENOUS
  Filled 2016-06-30 (×6): qty 4

## 2016-06-30 MED ORDER — PANTOPRAZOLE SODIUM 40 MG PO TBEC
40.0000 mg | DELAYED_RELEASE_TABLET | Freq: Every day | ORAL | Status: DC
  Administered 2016-06-30 – 2016-07-03 (×4): 40 mg via ORAL
  Filled 2016-06-30 (×4): qty 1

## 2016-06-30 NOTE — Progress Notes (Signed)
Occupational Therapy Treatment Patient Details Name: Alen Matheson MRN: [id number] DOB: 1951-03-30 Today's Date: 06/30/2016    History of present illness Patient is a 66 y/o male CVA, HTN, HLD, DM, smoker, OSA on CPAP presents with central cord syndrome s/p C2-5 posterior cervical fusion and C2-4 decompression. with compromised airway after surgery., extubated 3/6.   OT comments  Pt demonstrates improving balance and improving functional mobilty.  He was able to sit with min guard - min A EOB, and transferred to chair with max A +2 and use of Stedy.   Follow Up Recommendations  CIR    Equipment Recommendations  Tub/shower bench    Recommendations for Other Services Rehab consult    Precautions / Restrictions Precautions Precautions: Cervical;Fall Precaution Comments: Hemovac Required Braces or Orthoses: Cervical Brace Cervical Brace: Hard collar;At all times       Mobility Bed Mobility Overal bed mobility: Needs Assistance Bed Mobility: Rolling;Sidelying to Sit Rolling: Max assist;+2 for physical assistance Sidelying to sit: Max assist;+2 for physical assistance       General bed mobility comments: Pt requires cues for log rolling and assist to roll and move into sitting   Transfers Overall transfer level: Needs assistance Equipment used: Ambulation equipment used Transfers: Sit to/from Stand Sit to Stand: Max assist;+2 physical assistance;From elevated surface         General transfer comment: Pt requires assist to lift buttocks from bed and for hip extension.      Balance Overall balance assessment: Needs assistance Sitting-balance support: Bilateral upper extremity supported Sitting balance-Leahy Scale: Poor Sitting balance - Comments: Pt requries min guard to min A for sitting EOB and sitting on Stedy    Standing balance support: Bilateral upper extremity supported;During functional activity Standing balance-Leahy Scale: Zero Standing balance comment: Using  stedy and bil. UE support, pt stood x 3 with mod - max A +2 for 30-60 seconds                    ADL Overall ADL's : Needs assistance/impaired                         Toilet Transfer: Maximal assistance;+2 for physical assistance;Stand-pivot (using Stedy)           Functional mobility during ADLs: Maximal assistance;+2 for physical assistance        Vision                     Perception     Praxis      Cognition   Behavior During Therapy: WFL for tasks assessed/performed Overall Cognitive Status: Within Functional Limits for tasks assessed                         Exercises     Shoulder Instructions       General Comments      Pertinent Vitals/ Pain       Pain Assessment: Faces Faces Pain Scale: Hurts even more Pain Location: Back Pain Descriptors / Indicators: Aching;Grimacing Pain Intervention(s): Monitored during session  Home Living                                          Prior Functioning/Environment              Frequency  Min 3X/week  Progress Toward Goals  OT Goals(current goals can now be found in the care plan section)  Progress towards OT goals: Progressing toward goals     Plan Discharge plan remains appropriate    Co-evaluation    PT/OT/SLP Co-Evaluation/Treatment: Yes Reason for Co-Treatment: Complexity of the patient's impairments (multi-system involvement);For patient/therapist safety   OT goals addressed during session: ADL's and self-care;Strengthening/ROM      End of Session Equipment Utilized During Treatment: Cervical collar  OT Visit Diagnosis: Muscle weakness (generalized) (M62.81)   Activity Tolerance Patient tolerated treatment well   Patient Left with call bell/phone within reach;in chair;with family/visitor present   Nurse Communication Mobility status        Time: 0762-2633 OT Time Calculation (min): 31 min  Charges: OT General  Charges $OT Visit: 1 Procedure OT Treatments $Therapeutic Activity: 8-22 mins  [facility], OTR/L [phone]    Lucille Passy M 06/30/2016, 3:02 PM

## 2016-06-30 NOTE — Progress Notes (Signed)
PROGRESS NOTE    Rodney Crane  DHR:[id number] DOB: November 11, 1950 DOA: 06/25/2016 PCP: Marton Redwood, MD   Brief Narrative: Rodney Crane is a 66 y.o. male with a history of CVA, diabetes mellitus, hypertension, OSA on CPAP. He presented with weakness of his legs and arms and was found to have cervical stenosis of the renal canal. Status post decompression and lesion on 06/26/2016. Disposition is CIR.   Assessment & Plan:   Principal Problem:   Cervical stenosis of spinal canal Active Problems:   Essential hypertension   Weakness of extremity   OSA (obstructive sleep apnea)   Hypertension   Diabetes ([place])   Spinal stenosis in cervical region   Acute hypoxemic respiratory failure (HCC)   Myelopathy (HCC)   OSA on CPAP   Surgery, elective   History of CVA (cerebrovascular accident)   Benign essential HTN   History of cervical spinal surgery   History of lumbar surgery   Tobacco abuse   Diastolic dysfunction   Acute blood loss anemia   Post-operative pain   Tachycardia   Leukocytosis   Spinal stenosis of lumbar region   Central cord syndrome ([place])   Cervical stenosis spinal canal Patient is status post decompression C2-C4 and fusion C2-C5 on 06/26/2016 -Orthopedic surgery recommendations: plan for laminectomy next week -CIR for disposition -pain management  Essential hypertension Uncontrolled overnight. Already restarted ARB but first dose is today. -Continue labetalol when necessary IV -continue ARB -watch blood pressure and add beta blocker if still high  Obstructive sleep apnea -Continue CPAP  Diabetes mellitus Hemoglobin A1C of 6 -continue SSI  DVT prophylaxis: SCDs Code Status: Full code  Family Communication: None at bedside  Disposition Plan: Discharge CIR when available    Consultants:   Orthopedic surgery (Dr. Rolena Infante)  Procedures:  Decompression of C2-C4  Fusion of C2-C5  Antimicrobials:  None    Subjective: Patient reports some  improvement in his strength. No chest pain or dyspnea  Objective: Vitals:   06/30/16 0630 06/30/16 0645 06/30/16 0700 06/30/16 0800  BP: (!) 166/82 (!) 182/87 (!) 186/83 (!) 193/95  Pulse: 73 75 77 84  Resp: 14 17 15  (!) 27  Temp:    97.8 F (36.6 C)  TempSrc:    Oral  SpO2: 100% 100% 100% 100%  Weight:      Height:        Intake/Output Summary (Last 24 hours) at 06/30/16 0904 Last data filed at 06/30/16 0800  Gross per 24 hour  Intake          1110.17 ml  Output             4400 ml  Net         -3289.83 ml   Filed Weights   06/25/16 1301 06/25/16 2022 06/27/16 0500  Weight: 127 kg (280 lb) 133 kg (293 lb 3.4 oz) 132.7 kg (292 lb 8.8 oz)    Examination:  General exam: Appears calm and comfortable Respiratory system: Clear to auscultation. Respiratory effort normal. Cardiovascular system: S1 & S2 heard, Normal rate with regular rhythm Gastrointestinal system: Abdomen is nondistended, soft and nontender. Normal bowel sounds heard. Central nervous system: Alert and oriented. 4/5 UE strength vs 3/5 LE strength. Diminished sensation of RLE Extremities: No edema. No calf tenderness Skin: No cyanosis. No rashes Psychiatry: Judgement and insight appear normal. Mood & affect appropriate.     Data Reviewed: I have personally reviewed following labs and imaging studies  CBC:  Recent Labs Lab 06/25/16 1635  06/27/16 1143 06/27/16 1314 06/27/16 1409 06/28/16 0447 06/29/16 0436  WBC 8.0  --   --   --  10.1 12.5*  NEUTROABS 3.7  --   --   --   --   --   HGB 12.5* 9.9* 6.5* 8.8* 10.5* 11.4*  HCT 37.6* 29.0* 19.0* 26.0* 30.6* 33.9*  MCV 89.1  --   --   --  86.9 88.3  PLT 307  --   --   --  201 295   Basic Metabolic Panel:  Recent Labs Lab 06/25/16 1635 06/27/16 1143 06/27/16 1314 06/27/16 1409 06/28/16 0447 06/29/16 0436  NA 137 139 136 138 138 135  K 4.4 3.8 4.7 4.3 4.4 3.9  CL 106  --   --   --  107 105  CO2 26  --   --   --  23 22  GLUCOSE 116*  --   --   179* 131* 150*  BUN 19  --   --   --  16 14  CREATININE 1.15  --   --   --  0.97 0.90  CALCIUM 9.4  --   --   --  8.5* 8.5*   GFR: Estimated Creatinine Clearance: 115.3 mL/min (by C-G formula based on SCr of 0.9 mg/dL). Liver Function Tests:  Recent Labs Lab 06/25/16 1635  AST 24  ALT 27  ALKPHOS 99  BILITOT 0.8  PROT 7.3  ALBUMIN 3.7   No results for input(s): LIPASE, AMYLASE in the last 168 hours. No results for input(s): AMMONIA in the last 168 hours. Coagulation Profile: No results for input(s): INR, PROTIME in the last 168 hours. Cardiac Enzymes:  Recent Labs Lab 06/28/16 0826 06/28/16 1412 06/28/16 2014  TROPONINI <0.03 <0.03 <0.03   BNP (last 3 results) No results for input(s): PROBNP in the last 8760 hours. HbA1C:  Recent Labs  06/27/16 1640  HGBA1C 6.0*   CBG:  Recent Labs Lab 06/29/16 1210 06/29/16 1534 06/29/16 2004 06/29/16 2126 06/30/16 0830  GLUCAP 150* 112* 130* 122* 138*   Lipid Profile:  Recent Labs  06/27/16 1717  TRIG 100   Thyroid Function Tests: No results for input(s): TSH, T4TOTAL, FREET4, T3FREE, THYROIDAB in the last 72 hours. Anemia Panel: No results for input(s): VITAMINB12, FOLATE, FERRITIN, TIBC, IRON, RETICCTPCT in the last 72 hours. Sepsis Labs: No results for input(s): PROCALCITON, LATICACIDVEN in the last 168 hours.  Recent Results (from the past 240 hour(s))  MRSA PCR Screening     Status: None   Collection Time: 06/27/16  4:40 PM  Result Value Ref Range Status   MRSA by PCR NEGATIVE NEGATIVE Final    Comment:        The GeneXpert MRSA Assay (FDA approved for NASAL specimens only), is one component of a comprehensive MRSA colonization surveillance program. It is not intended to diagnose MRSA infection nor to guide or monitor treatment for MRSA infections.          Radiology Studies: Dg Chest Port 1 View  Result Date: 06/29/2016 CLINICAL DATA:  Respiratory failure EXAM: PORTABLE CHEST 1 VIEW  COMPARISON:  06/27/2016 FINDINGS: Cardiac shadow is not enlarged and accentuated by the portable technique. Postsurgical changes in the cervical spine are again seen. The lungs are well aerated bilaterally with mild bibasilar atelectatic changes. No focal infiltrate or sizable effusion is seen. No bony abnormality is noted. IMPRESSION: No change from the prior exam. Electronically Signed   By: Linus Mako.D.  On: 06/29/2016 07:36        Scheduled Meds: . insulin aspart  0-20 Units Subcutaneous TID AC & HS  . irbesartan  300 mg Oral Daily  . pantoprazole  40 mg Oral q1800  . sodium chloride flush  3 mL Intravenous Q12H   Continuous Infusions: . sodium chloride    . lactated ringers Stopped (06/30/16 0455)     LOS: 5 days     Cordelia Poche [facility] 06/30/2016, 9:04 AM Pager: [phone]  If 7PM-7AM, please contact night-coverage [email or web address] Password [email or web address] 06/30/2016, 9:04 AM

## 2016-06-30 NOTE — Progress Notes (Signed)
Patient states he does not wish to wear CPAP tonight. RT will continue to monitor.

## 2016-06-30 NOTE — Progress Notes (Addendum)
Notified on-call [facility] hospitalist's concerning pt's continued hypertension after giving PRN labetalol.   New orders given - increased frequency of labetalol PRN to every 2 hours from every 4 hours.  Will continue to monitor and treat as needed.  Guadalupe Maple, RN

## 2016-06-30 NOTE — Progress Notes (Signed)
    Subjective: 3 Days Post-Op Procedure(s) (LRB): POSTERIOR CERVICAL FUSION C2-C5; C2-C4 Decompression (N/A) Patient reports pain as 3 on 0-10 scale.   Denies CP or SOB.  Voiding without difficulty. Positive flatus. Pt up in chair.  Pt working with PT daily.  Pt reports improvement in dysphagia. Pt reports increasing strength in his arms and legs. Pt has been able to stand for longer period of time.  Objective: Vital signs in last 24 hours: Temp:  [97.8 F (36.6 C)-99 F (37.2 C)] 97.8 F (36.6 C) (03/08 0800) Pulse Rate:  [71-95] 78 (03/08 1200) Resp:  [13-27] 17 (03/08 1200) BP: (134-201)/(67-109) 173/84 (03/08 1200) SpO2:  [93 %-100 %] 100 % (03/08 1200)  Intake/Output from previous day: 03/07 0701 - 03/08 0700 In: 1680.2 [P.O.:520; I.V.:1050.2; IV Piggyback:110] Out: 6945 [Urine:3850; Drains:100] Intake/Output this shift: Total I/O In: -  Out: 825 [Urine:825]  Labs:  Recent Labs  06/27/16 1314 06/27/16 1409 06/28/16 0447 06/29/16 0436  HGB 6.5* 8.8* 10.5* 11.4*    Recent Labs  06/28/16 0447 06/29/16 0436  WBC 10.1 12.5*  RBC 3.52* 3.84*  HCT 30.6* 33.9*  PLT 201 209    Recent Labs  06/28/16 0447 06/29/16 0436  NA 138 135  K 4.4 3.9  CL 107 105  CO2 23 22  BUN 16 14  CREATININE 0.97 0.90  GLUCOSE 131* 150*  CALCIUM 8.5* 8.5*   No results for input(s): LABPT, INR in the last 72 hours.  Physical Exam: Neurologically intact ABD soft Sensation intact distally Incision: no drainage Compartment soft  Assessment/Plan: 3 Days Post-Op Procedure(s) (LRB): POSTERIOR CERVICAL FUSION C2-C5; C2-C4 Decompression (N/A) Advance diet Up with therapy Pt being considered for Inpatient Rehab Lumbar surgery pending for next week Hospitalist managing Hypertension Pulled 2nd drain today  [place]oo Muzquiz, Darla Lesches for Dr. Melina Schools [facility] [phone] 06/30/2016, 12:27 PM

## 2016-06-30 NOTE — Progress Notes (Signed)
I met with pt and his wife at bedside to discuss a possible inpt rehab admission. Both are in agreement. Ortho PA in to see pt and we discussed timing of lumbar surgery. Pt and wife state Dr. Rolena Infante will see pt Sunday to further discuss second surgery plans. I will discuss with Rehab MD timing of plans for admission to CIR and follow up tomorrow. [phone]

## 2016-06-30 NOTE — Progress Notes (Signed)
Physical Therapy Treatment Patient Details Name: Rodney Crane MRN: [id number] DOB: 05/29/1950 Today's Date: 06/30/2016    History of Present Illness Patient is a 66 y/o male CVA, HTN, HLD, DM, smoker, OSA on CPAP presents with central cord syndrome s/p C2-5 posterior cervical fusion and C2-4 decompression. with compromised airway after surgery., extubated 3/6.    PT Comments    Pt continues to participate well and is very motivated.  Pt able to work on standing tolerance in stedy today and better able to support his own weight.  Pt continues to require 2 person A for all aspects of mobility.  Will continue to follow.     Follow Up Recommendations  CIR     Equipment Recommendations  None recommended by PT    Recommendations for Other Services       Precautions / Restrictions Precautions Precautions: Cervical;Fall Precaution Comments: Hemovac Required Braces or Orthoses: Cervical Brace Cervical Brace: Hard collar;At all times Restrictions Weight Bearing Restrictions: No    Mobility  Bed Mobility Overal bed mobility: Needs Assistance Bed Mobility: Rolling;Sidelying to Sit Rolling: Max assist;+2 for physical assistance Sidelying to sit: Max assist;+2 for physical assistance       General bed mobility comments: Pt requires cues for log rolling and assist to roll and move into sitting   Transfers Overall transfer level: Needs assistance Equipment used: Ambulation equipment used Transfers: Sit to/from Stand Sit to Stand: Max assist;+2 physical assistance;From elevated surface         General transfer comment: Pt requires assist to lift buttocks from bed and for hip extension.    Ambulation/Gait                 Stairs            Wheelchair Mobility    Modified Rankin (Stroke Patients Only)       Balance Overall balance assessment: Needs assistance Sitting-balance support: Bilateral upper extremity supported Sitting balance-Leahy Scale:  Poor Sitting balance - Comments: Pt requries min guard to min A for sitting EOB and sitting on Stedy    Standing balance support: Bilateral upper extremity supported;During functional activity Standing balance-Leahy Scale: Zero Standing balance comment: Using stedy and bil. UE support, pt stood x 3 with mod - max A +2 for 30-60 seconds                     Cognition Arousal/Alertness: Awake/alert Behavior During Therapy: WFL for tasks assessed/performed Overall Cognitive Status: Within Functional Limits for tasks assessed                      Exercises      General Comments        Pertinent Vitals/Pain Pain Assessment: Faces Faces Pain Scale: Hurts even more Pain Location: Back Pain Descriptors / Indicators: Aching;Grimacing Pain Intervention(s): Monitored during session;Premedicated before session;Repositioned    Home Living                      Prior Function            PT Goals (current goals can now be found in the care plan section) Acute Rehab PT Goals Patient Stated Goal: to take my wife on a vacation PT Goal Formulation: With patient Time For Goal Achievement: 07/12/16 Potential to Achieve Goals: Good Progress towards PT goals: Progressing toward goals    Frequency    Min 5X/week      PT Plan Current  plan remains appropriate    Co-evaluation PT/OT/SLP Co-Evaluation/Treatment: Yes Reason for Co-Treatment: Complexity of the patient's impairments (multi-system involvement);Necessary to address cognition/behavior during functional activity;For patient/therapist safety;To address functional/ADL transfers PT goals addressed during session: Mobility/safety with mobility;Balance;Proper use of DME;Strengthening/ROM OT goals addressed during session: ADL's and self-care;Strengthening/ROM     End of Session Equipment Utilized During Treatment: Gait belt Activity Tolerance: Patient tolerated treatment well Patient left: in chair;with  call bell/phone within reach Nurse Communication: Mobility status;Need for lift equipment PT Visit Diagnosis: Muscle weakness (generalized) (M62.81);Difficulty in walking, not elsewhere classified (R26.2);Pain Pain - part of body:  (Neck and back)     Time: 0813-8871 PT Time Calculation (min) (ACUTE ONLY): 26 min  Charges:  $Therapeutic Activity: 8-22 mins                    G CodesCatarina Hartshorn, [place] [phone] 06/30/2016, 3:11 PM

## 2016-06-30 NOTE — Evaluation (Signed)
Clinical/Bedside Swallow Evaluation Patient Details  Name: Rodney Crane MRN: [id number] Date of Birth: 1951/03/13  Today's Date: 06/30/2016 Time: SLP Start Time (ACUTE ONLY): 67 SLP Stop Time (ACUTE ONLY): 1551 SLP Time Calculation (min) (ACUTE ONLY): 14 min  Past Medical History:  Past Medical History:  Diagnosis Date  . Arthritis   . Colon polyps   . CVA (cerebral infarction) 2010  . Diabetes mellitus   . Dizziness   . ED (erectile dysfunction)   . Foot drop   . H/O Bell's palsy   . Hyperlipidemia   . Hypertension   . Obesity   . OSA on CPAP   . Spinal stenosis   . Stroke ([place])   . Unsteady gait    Past Surgical History:  Past Surgical History:  Procedure Laterality Date  . CARPAL TUNNEL RELEASE     bilateral  . CERVICAL DISCECTOMY  2004  . LUMBAR DISC SURGERY     x6  . POSTERIOR CERVICAL FUSION/FORAMINOTOMY N/A 06/27/2016   Procedure: POSTERIOR CERVICAL FUSION C2-C5; C2-C4 Decompression;  Surgeon: Melina Schools, MD;  Location: [facility];  Service: Orthopedics;  Laterality: N/A;   HPI:  Patient is a 66 y/o male CVA, HTN, HLD, DM, smoker, OSA on CPAP presents with central cord syndrome s/p C2-5 posterior cervical fusion and C2-4 decompression 3/4. With compromised airway after surgery, extubated 3/6.   Assessment / Plan / Recommendation Clinical Impression  Patient presents with what appears to be a primary pharyngeal phase dysphagia, likely related to post op edema from posterior cervical fusion. S/s of aspiration noted across consistencies, regular texture solids and liquids greater than pureed solids. Suspect that cough and throat clear in response to decreased airway protection successful at preventing gross aspiration. Discussed with patient and RN. Recommend downgrading diet to puree with thin liquid. MBS recommended 3/9 to determine extent of dysphagia and least restrictive diet with use of compensatory strategies.  SLP Visit Diagnosis: Dysphagia, pharyngeal phase  (R13.13)    Aspiration Risk  Moderate aspiration risk    Diet Recommendation Dysphagia 1 (Puree);Thin liquid   Liquid Administration via: Cup Medication Administration: Crushed with puree Supervision: Full supervision/cueing for compensatory strategies;Staff to assist with self feeding Compensations: Slow rate;Small sips/bites Postural Changes: Seated upright at 90 degrees    Other  Recommendations Oral Care Recommendations: Oral care BID   Follow up Recommendations  (TBD)      Frequency and Duration            Prognosis Prognosis for Safe Diet Advancement: Good      Swallow Study   General HPI: Patient is a 66 y/o male CVA, HTN, HLD, DM, smoker, OSA on CPAP presents with central cord syndrome s/p C2-5 posterior cervical fusion and C2-4 decompression 3/4. With compromised airway after surgery, extubated 3/6. Type of Study: Bedside Swallow Evaluation Previous Swallow Assessment: none Diet Prior to this Study: Dysphagia 3 (soft);Thin liquids Temperature Spikes Noted: No Respiratory Status: Room air History of Recent Intubation: Yes Length of Intubations (days): 2 days Date extubated: 06/28/16 Behavior/Cognition: Alert;Cooperative;Pleasant mood Oral Cavity Assessment: Within Functional Limits Oral Care Completed by SLP: No Oral Cavity - Dentition: Adequate natural dentition Vision: Functional for self-feeding Self-Feeding Abilities: Able to feed self;Needs assist Patient Positioning: Upright in chair Baseline Vocal Quality: Normal Volitional Cough: Strong Volitional Swallow: Able to elicit    Oral/Motor/Sensory Function Overall Oral Motor/Sensory Function: Within functional limits   Ice Chips Ice chips: Within functional limits Presentation: Spoon   Thin Liquid Thin Liquid: Impaired  Presentation: Cup;Self Fed;Straw Pharyngeal  Phase Impairments: Multiple swallows;Throat Clearing - Immediate    Nectar Thick Nectar Thick Liquid: Not tested   Honey Thick Honey Thick  Liquid: Not tested   Puree Puree: Impaired Presentation: Spoon Pharyngeal Phase Impairments: Multiple swallows;Throat Clearing - Delayed   Solid   GO   Solid: Impaired Pharyngeal Phase Impairments: Multiple swallows;Cough - Immediate;Throat Clearing - Immediate (c/o globus)       Rodney Pehl MA, CCC-SLP [phone]  Rodney Crane Rodney Crane 06/30/2016,3:53 PM

## 2016-07-01 ENCOUNTER — Inpatient Hospital Stay (HOSPITAL_COMMUNITY): Payer: Medicare Other

## 2016-07-01 LAB — GLUCOSE, CAPILLARY
GLUCOSE-CAPILLARY: 152 mg/dL — AB (ref 65–99)
GLUCOSE-CAPILLARY: 170 mg/dL — AB (ref 65–99)
GLUCOSE-CAPILLARY: 162 mg/dL — AB (ref 65–99)
Glucose-Capillary: 154 mg/dL — ABNORMAL HIGH (ref 65–99)

## 2016-07-01 MED ORDER — METOPROLOL SUCCINATE ER 25 MG PO TB24
50.0000 mg | ORAL_TABLET | Freq: Every day | ORAL | Status: DC
  Administered 2016-07-01 – 2016-07-04 (×4): 50 mg via ORAL
  Filled 2016-07-01 (×2): qty 1
  Filled 2016-07-01 (×2): qty 2

## 2016-07-01 NOTE — Progress Notes (Signed)
PROGRESS NOTE    Rodney Crane  WIO:[id number] DOB: [birth year] DOA: 06/25/2016 PCP: Marton Redwood, MD   Brief Narrative: Rodney Crane is a 66 y.o. male with a history of CVA, diabetes mellitus, hypertension, OSA on CPAP. He presented with weakness of his legs and arms and was found to have cervical stenosis of the renal canal. Status post decompression and lesion on 06/26/2016. Disposition is CIR.   Assessment & Plan:   Principal Problem:   Cervical stenosis of spinal canal Active Problems:   Essential hypertension   Weakness of extremity   OSA (obstructive sleep apnea)   Hypertension   Diabetes ([place])   Spinal stenosis in cervical region   Acute hypoxemic respiratory failure (HCC)   Myelopathy (HCC)   OSA on CPAP   Surgery, elective   History of CVA (cerebrovascular accident)   Benign essential HTN   History of cervical spinal surgery   History of lumbar surgery   Tobacco abuse   Diastolic dysfunction   Acute blood loss anemia   Post-operative pain   Tachycardia   Leukocytosis   Spinal stenosis of lumbar region   Central cord syndrome ([place])   Cervical stenosis spinal canal Patient is status post decompression C2-C4 and fusion C2-C5 on 06/26/2016 -Orthopedic surgery recommendations: plan for laminectomy next week -CIR for disposition -pain management  Essential hypertension Improved control but still uncontrolled.  -Continue labetalol when necessary IV -continue ARB -restart home metoprolol  Obstructive sleep apnea -Continue CPAP  Diabetes mellitus Hemoglobin A1C of 6 -continue SSI  DVT prophylaxis: SCDs Code Status: Full code  Family Communication: None at bedside  Disposition Plan: Discharge CIR when available    Consultants:   Orthopedic surgery (Dr. Rolena Infante)  Procedures:  Decompression of C2-C4  Fusion of C2-C5  Antimicrobials:  None    Subjective: No chest pain. No dyspnea. He reports some congestion overnight. He was able to get p  and sit in the chair for most of the day yesterday.  Objective: Vitals:   07/01/16 0400 07/01/16 0405 07/01/16 0413 07/01/16 0800  BP: (!) 173/79  (!) 164/86   Pulse: 80  87   Resp: 14  17   Temp:  98.3 F (36.8 C)  98.3 F (36.8 C)  TempSrc:  Oral  Oral  SpO2: 100%  100%   Weight:      Height:        Intake/Output Summary (Last 24 hours) at 07/01/16 0816 Last data filed at 07/01/16 0600  Gross per 24 hour  Intake              123 ml  Output             2310 ml  Net            -2187 ml   Filed Weights   06/25/16 1301 06/25/16 2022 06/27/16 0500  Weight: 127 kg (280 lb) 133 kg (293 lb 3.4 oz) 132.7 kg (292 lb 8.8 oz)    Examination:  General exam: Appears calm and comfortable Respiratory system: Clear to auscultation. Respiratory effort normal. Cardiovascular system: S1 & S2 heard, Normal rate with regular rhythm Gastrointestinal system: Abdomen is nondistended, soft and nontender. Normal bowel sounds heard. Central nervous system: Alert and oriented. Extremities: No edema. No calf tenderness Skin: No cyanosis. No rashes Psychiatry: Judgement and insight appear normal. Mood & affect appropriate.     Data Reviewed: I have personally reviewed following labs and imaging studies  CBC:  Recent Labs Lab 06/25/16 1635  06/27/16 1143 06/27/16 1314 06/27/16 1409 06/28/16 0447 06/29/16 0436  WBC 8.0  --   --   --  10.1 12.5*  NEUTROABS 3.7  --   --   --   --   --   HGB 12.5* 9.9* 6.5* 8.8* 10.5* 11.4*  HCT 37.6* 29.0* 19.0* 26.0* 30.6* 33.9*  MCV 89.1  --   --   --  86.9 88.3  PLT 307  --   --   --  201 355   Basic Metabolic Panel:  Recent Labs Lab 06/25/16 1635 06/27/16 1143 06/27/16 1314 06/27/16 1409 06/28/16 0447 06/29/16 0436  NA 137 139 136 138 138 135  K 4.4 3.8 4.7 4.3 4.4 3.9  CL 106  --   --   --  107 105  CO2 26  --   --   --  23 22  GLUCOSE 116*  --   --  179* 131* 150*  BUN 19  --   --   --  16 14  CREATININE 1.15  --   --   --  0.97 0.90    CALCIUM 9.4  --   --   --  8.5* 8.5*   GFR: Estimated Creatinine Clearance: 115.3 mL/min (by C-G formula based on SCr of 0.9 mg/dL). Liver Function Tests:  Recent Labs Lab 06/25/16 1635  AST 24  ALT 27  ALKPHOS 99  BILITOT 0.8  PROT 7.3  ALBUMIN 3.7   No results for input(s): LIPASE, AMYLASE in the last 168 hours. No results for input(s): AMMONIA in the last 168 hours. Coagulation Profile: No results for input(s): INR, PROTIME in the last 168 hours. Cardiac Enzymes:  Recent Labs Lab 06/28/16 0826 06/28/16 1412 06/28/16 2014  TROPONINI <0.03 <0.03 <0.03   BNP (last 3 results) No results for input(s): PROBNP in the last 8760 hours. HbA1C: No results for input(s): HGBA1C in the last 72 hours. CBG:  Recent Labs Lab 06/30/16 0830 06/30/16 1240 06/30/16 1729 06/30/16 2124 07/01/16 0725  GLUCAP 138* 188* 146* 142* 152*   Lipid Profile:  Recent Labs  06/30/16 1625  TRIG 76   Thyroid Function Tests: No results for input(s): TSH, T4TOTAL, FREET4, T3FREE, THYROIDAB in the last 72 hours. Anemia Panel: No results for input(s): VITAMINB12, FOLATE, FERRITIN, TIBC, IRON, RETICCTPCT in the last 72 hours. Sepsis Labs: No results for input(s): PROCALCITON, LATICACIDVEN in the last 168 hours.  Recent Results (from the past 240 hour(s))  MRSA PCR Screening     Status: None   Collection Time: 06/27/16  4:40 PM  Result Value Ref Range Status   MRSA by PCR NEGATIVE NEGATIVE Final    Comment:        The GeneXpert MRSA Assay (FDA approved for NASAL specimens only), is one component of a comprehensive MRSA colonization surveillance program. It is not intended to diagnose MRSA infection nor to guide or monitor treatment for MRSA infections.          Radiology Studies: No results found.      Scheduled Meds: . insulin aspart  0-20 Units Subcutaneous TID AC & HS  . irbesartan  300 mg Oral Daily  . metoprolol succinate  50 mg Oral Daily  . pantoprazole  40  mg Oral q1800  . sodium chloride flush  3 mL Intravenous Q12H   Continuous Infusions: . sodium chloride    . lactated ringers Stopped (06/30/16 0455)     LOS: 6 days     Cordelia Poche  [facility] 07/01/2016, 8:16 AM Pager: [phone]  If 7PM-7AM, please contact night-coverage [email or web address] Password [email or web address] 07/01/2016, 8:16 AM

## 2016-07-01 NOTE — Progress Notes (Signed)
Occupational Therapy Treatment Patient Details Name: Rodney Crane MRN: [id number] DOB: [birth year] Today's Date: 07/01/2016    History of present illness Patient is a 66 y/o male CVA, HTN, HLD, DM, smoker, OSA on CPAP presents with central cord syndrome s/p C2-5 posterior cervical fusion and C2-4 decompression; with compromised airway after surgery; extubated 3/6.   OT comments  Pt positioned in chair with pillows for UE support and reports comfort. Pt ready and positioned for arrival of lunch tray. Pt educated on don doff ccollar with transfers. Pt able to roll this session into the brace.    Follow Up Recommendations  CIR    Equipment Recommendations  Tub/shower bench    Recommendations for Other Services Rehab consult    Precautions / Restrictions Precautions Precautions: Cervical Required Braces or Orthoses: Cervical Brace Cervical Brace: Hard collar;Other (comment) Restrictions Weight Bearing Restrictions: No       Mobility Bed Mobility Overal bed mobility: Needs Assistance Bed Mobility: Rolling;Supine to Sit Rolling: +2 for physical assistance;Max assist Sidelying to sit: Max assist;+2 for physical assistance Supine to sit: +2 for physical assistance;Mod assist     General bed mobility comments: pt reaching with L UE and needed (A) to position R UE to (A). Pt needs (A) to initiate transition  Transfers Overall transfer level: Needs assistance Equipment used: Ambulation equipment used Transfers: Sit to/from Stand Sit to Stand: +2 physical assistance;Mod assist Stand pivot transfers: Total assist (w/ Stedy)       General transfer comment: pt able to initiate power up and even verbalizing allowed the steps to transfer. Pt able to sustain bil UE on stedy    Balance Overall balance assessment: Needs assistance Sitting-balance support: Bilateral upper extremity supported;Feet supported Sitting balance-Leahy Scale: Fair Sitting balance - Comments: Supervision to  minA for balance sitting EOB; requires intermittent verbal cues to maintain upright posture secondary to fatigue.   Standing balance support: Bilateral upper extremity supported;During functional activity Standing balance-Leahy Scale: Zero Standing balance comment: Use of Stedy and BUE support to maintain balance; able to stand 30-60sec x2 with Stedy.                    ADL Overall ADL's : Needs assistance/impaired                                       General ADL Comments: pt with brace off in bed. pt reports busy schedule this AM but motivated to be oOB. tp and wife educated on allowing patient to self feed due to wife feeding him on arrival. pt states "i understand. I did do it yesterday" pt demonstrates powering up this session      Vision                     Perception     Praxis      Cognition   Behavior During Therapy: WFL for tasks assessed/performed Overall Cognitive Status: Within Functional Limits for tasks assessed                         Exercises     Shoulder Instructions       General Comments      Pertinent Vitals/ Pain       Pain Assessment: Faces Pain Score: 4  Faces Pain Scale: Hurts little more Pain Location: back Pain Descriptors / Indicators:  Sore Pain Intervention(s): Limited activity within patient's tolerance;Monitored during session;Premedicated before session;Repositioned  Home Living                                          Prior Functioning/Environment              Frequency  Min 3X/week        Progress Toward Goals  OT Goals(current goals can now be found in the care plan section)  Progress towards OT goals: Progressing toward goals  Acute Rehab OT Goals Patient Stated Goal: to take my wife on a vacation OT Goal Formulation: With patient Time For Goal Achievement: 07/12/16 Potential to Achieve Goals: Good ADL Goals Pt Will Perform Eating: with min assist;with  adaptive utensils;sitting;bed level Pt Will Perform Grooming: with min assist;with adaptive equipment;sitting;bed level Pt Will Perform Upper Body Bathing: with mod assist;sitting;bed level Pt Will Transfer to Toilet: with max assist;with +2 assist;stand pivot transfer;bedside commode Pt/caregiver will Perform Home Exercise Program: Increased ROM;Increased strength;Right Upper extremity;Left upper extremity;With minimal assist  Plan Discharge plan remains appropriate    Co-evaluation    PT/OT/SLP Co-Evaluation/Treatment: Yes Reason for Co-Treatment: Complexity of the patient's impairments (multi-system involvement);Necessary to address cognition/behavior during functional activity;For patient/therapist safety;To address functional/ADL transfers PT goals addressed during session: Mobility/safety with mobility;Balance;Proper use of DME;Strengthening/ROM OT goals addressed during session: ADL's and self-care;Proper use of Adaptive equipment and DME;Strengthening/ROM      End of Session Equipment Utilized During Treatment: Cervical collar;Gait belt  OT Visit Diagnosis: Muscle weakness (generalized) (M62.81)   Activity Tolerance Patient tolerated treatment well   Patient Left with call bell/phone within reach;in chair;with family/visitor present   Nurse Communication Mobility status        Time: 5379 [phone] OT Time Calculation (min): 21 min  Charges: OT General Charges $OT Visit: 1 Procedure OT Treatments $Therapeutic Activity: 8-22 mins   Jeri Modena   OTR/L Pager: [phone] Office: [phone] .    Parke Poisson B 07/01/2016, 2:18 PM

## 2016-07-01 NOTE — Progress Notes (Signed)
I met with pt at bedside to inform him that I have discussed his case with my rehab MD, Dr. Naaman Plummer. We can plan admit to inpt rehab once second surgery is complete. I will follow up on Monday to clarify when that is scheduled. [phone]

## 2016-07-01 NOTE — Progress Notes (Signed)
    Subjective: 4 Days Post-Op Procedure(s) (LRB): POSTERIOR CERVICAL FUSION C2-C5; C2-C4 Decompression (N/A) Patient reports pain as 4 on 0-10 scale.   Denies CP or SOB.  Voiding without difficulty. Positive flatus.  Pt continue sto participate in PT and they feel like he is gaining strength.  Pt is still in ICU and has not been approved to move to Inpatient rehab.  Objective: Vital signs in last 24 hours: Temp:  [97.5 F (36.4 C)-99.2 F (37.3 C)] 98.3 F (36.8 C) (03/09 0405) Pulse Rate:  [71-87] 87 (03/09 0413) Resp:  [11-27] 17 (03/09 0413) BP: (134-201)/(79-99) 164/86 (03/09 0413) SpO2:  [97 %-100 %] 100 % (03/09 0413)  Intake/Output from previous day: 03/08 0701 - 03/09 0700 In: 123 [P.O.:120; I.V.:3] Out: 2760 [Urine:2695; Drains:65] Intake/Output this shift: No intake/output data recorded.  Labs:  Recent Labs  06/29/16 0436  HGB 11.4*    Recent Labs  06/29/16 0436  WBC 12.5*  RBC 3.84*  HCT 33.9*  PLT 209    Recent Labs  06/29/16 0436  NA 135  K 3.9  CL 105  CO2 22  BUN 14  CREATININE 0.90  GLUCOSE 150*  CALCIUM 8.5*   No results for input(s): LABPT, INR in the last 72 hours.  Physical Exam: Neurologically intact ABD soft Sensation intact distally Incision: scant drainage Compartment soft  Assessment/Plan: 4 Days Post-Op Procedure(s) (LRB): POSTERIOR CERVICAL FUSION C2-C5; C2-C4 Decompression (N/A) Up with therapy  Removed Drain today Pt is still on soft diet Lumbar surgery pending for next week Hospitalist continues to manage BP  [place], Darla Lesches for Dr. Melina Schools [facility] [phone] 07/01/2016, 7:45 AM

## 2016-07-01 NOTE — Progress Notes (Signed)
PT Cancellation Note  Patient Details Name: Kumar Falwell MRN: [id number] DOB: [birth year]   Cancelled Treatment:    Reason Eval/Treat Not Completed: Patient at procedure or test/unavailable pt off floor getting swallow test. Will follow up as time allows.   Marguarite Arbour A Sukhdeep Wieting 07/01/2016, 9:22 AM Wray Kearns, [place], DPT [phone]

## 2016-07-01 NOTE — Progress Notes (Signed)
Physical Therapy Treatment Patient Details Name: Cornell Bourbon MRN: [id number] DOB: 02/19/1951 Today's Date: 07/01/2016    History of Present Illness Patient is a 66 y/o male CVA, HTN, HLD, DM, smoker, OSA on CPAP presents with central cord syndrome s/p C2-5 posterior cervical fusion and C2-4 decompression; with compromised airway after surgery; extubated 3/6.    PT Comments    Pt continues to be motivated to work with PT and is slowly progressing with mobility. Today, able to stand x2 with Stedy and modA +2 and tolerate standing 30-60 sec with BUE support of Stedy. Educ on importance of mobility and using BUE for eating and other tasks as much as possible. Continues to require maxA +2 for bed mobility. Will continue to follow acutely.    Follow Up Recommendations  CIR     Equipment Recommendations  None recommended by PT    Recommendations for Other Services       Precautions / Restrictions Precautions Precautions: Cervical;Fall Required Braces or Orthoses: Cervical Brace Cervical Brace: Hard collar;Other (comment) (for OOB) Restrictions Weight Bearing Restrictions: No    Mobility  Bed Mobility Overal bed mobility: Needs Assistance Bed Mobility: Rolling;Sidelying to Sit Rolling: Max assist;+2 for physical assistance Sidelying to sit: Max assist;+2 for physical assistance       General bed mobility comments: Pt requires cues for log rolling and assist to roll and move into sitting   Transfers Overall transfer level: Needs assistance Equipment used: Ambulation equipment used Transfers: Sit to/from [facility] Sit to Stand: Mod assist;+2 physical assistance;From elevated surface (w/ Stedy) Stand pivot transfers: Total assist (w/ Stedy)       General transfer comment: Assist to lift buttocks from bed and cues for hip extension.   Ambulation/Gait                 Stairs            Wheelchair Mobility    Modified Rankin (Stroke  Patients Only)       Balance Overall balance assessment: Needs assistance Sitting-balance support: Bilateral upper extremity supported;Feet supported Sitting balance-Leahy Scale: Fair Sitting balance - Comments: Supervision to minA for balance sitting EOB; requires intermittent verbal cues to maintain upright posture secondary to fatigue.   Standing balance support: Bilateral upper extremity supported;During functional activity Standing balance-Leahy Scale: Zero Standing balance comment: Use of Stedy and BUE support to maintain balance; able to stand 30-60sec x2 with Stedy.                     Cognition Arousal/Alertness: Awake/alert Behavior During Therapy: WFL for tasks assessed/performed Overall Cognitive Status: Within Functional Limits for tasks assessed                      Exercises      General Comments General comments (skin integrity, edema, etc.): Wife present throughout session.       Pertinent Vitals/Pain Pain Assessment: Faces Pain Score: 4  Pain Location: Back Pain Descriptors / Indicators: Sore Pain Intervention(s): Limited activity within patient's tolerance;Monitored during session;Repositioned    Home Living                      Prior Function            PT Goals (current goals can now be found in the care plan section) Progress towards PT goals: Progressing toward goals    Frequency    Min 5X/week  PT Plan Current plan remains appropriate    Co-evaluation PT/OT/SLP Co-Evaluation/Treatment: Yes Reason for Co-Treatment: Complexity of the patient's impairments (multi-system involvement);To address functional/ADL transfers;For patient/therapist safety PT goals addressed during session: Mobility/safety with mobility;Balance;Proper use of DME;Strengthening/ROM       End of Session Equipment Utilized During Treatment: Gait belt Activity Tolerance: Patient tolerated treatment well Patient left: in chair;with call  bell/phone within reach;with family/visitor present Nurse Communication: Mobility status;Need for lift equipment PT Visit Diagnosis: Muscle weakness (generalized) (M62.81);Difficulty in walking, not elsewhere classified (R26.2)     Time: 1771-1657 PT Time Calculation (min) (ACUTE ONLY): 21 min  Charges:  $Therapeutic Activity: 8-22 mins                    G Codes:      Enis Gash, SPT Office-[phone]  Mabeline Caras 07/01/2016, 1:51 PM

## 2016-07-02 LAB — GLUCOSE, CAPILLARY
GLUCOSE-CAPILLARY: 140 mg/dL — AB (ref 65–99)
GLUCOSE-CAPILLARY: 177 mg/dL — AB (ref 65–99)
Glucose-Capillary: 146 mg/dL — ABNORMAL HIGH (ref 65–99)
Glucose-Capillary: 156 mg/dL — ABNORMAL HIGH (ref 65–99)

## 2016-07-02 MED ORDER — HYDROCHLOROTHIAZIDE 12.5 MG PO CAPS
12.5000 mg | ORAL_CAPSULE | Freq: Every day | ORAL | Status: DC
  Administered 2016-07-02 – 2016-07-04 (×3): 12.5 mg via ORAL
  Filled 2016-07-02 (×3): qty 1

## 2016-07-02 NOTE — Progress Notes (Signed)
PROGRESS NOTE    Rodney Crane  ZOX:[id number] DOB: Jul 05, 1950 DOA: 06/25/2016 PCP: Marton Redwood, MD   Brief Narrative: Rodney Crane is a 66 y.o. male with a history of CVA, diabetes mellitus, hypertension, OSA on CPAP. He presented with weakness of his legs and arms and was found to have cervical stenosis of the renal canal. Status post decompression and lesion on 06/26/2016. Disposition is CIR.   Assessment & Plan:   Principal Problem:   Cervical stenosis of spinal canal Active Problems:   Essential hypertension   Weakness of extremity   OSA (obstructive sleep apnea)   Hypertension   Diabetes ([place])   Spinal stenosis in cervical region   Acute hypoxemic respiratory failure (HCC)   Myelopathy (HCC)   OSA on CPAP   Surgery, elective   History of CVA (cerebrovascular accident)   Benign essential HTN   History of cervical spinal surgery   History of lumbar surgery   Tobacco abuse   Diastolic dysfunction   Acute blood loss anemia   Post-operative pain   Tachycardia   Leukocytosis   Spinal stenosis of lumbar region   Central cord syndrome ([place])   Cervical stenosis spinal canal Patient is status post decompression C2-C4 and fusion C2-C5 on 06/26/2016 -Orthopedic surgery recommendations: plan for laminectomy next week -CIR for disposition -pain management  Essential hypertension Improved control but still uncontrolled.  -Continue labetalol when necessary IV -continue ARB -continue home metoprolol -start hydrochlorothiazide 12.5mg   Obstructive sleep apnea -Continue CPAP  Diabetes mellitus Hemoglobin A1C of 6 -continue SSI  DVT prophylaxis: SCDs Code Status: Full code  Family Communication: None at bedside  Disposition Plan: Discharge CIR when available    Consultants:   Orthopedic surgery (Dr. Rolena Infante)  Procedures:  Decompression of C2-C4  Fusion of C2-C5  Antimicrobials:  None    Subjective: Back pain is controlled. No chest pain or  dyspnea.  Objective: Vitals:   07/02/16 0350 07/02/16 0400 07/02/16 0500 07/02/16 0600  BP:  (!) 141/87 (!) 162/73 137/79  Pulse:  83 81 89  Resp:  17 17 18   Temp: 98.6 F (37 C)     TempSrc: Oral     SpO2:  100% 100% 100%  Weight:      Height:        Intake/Output Summary (Last 24 hours) at 07/02/16 0743 Last data filed at 07/02/16 0600  Gross per 24 hour  Intake                3 ml  Output             2050 ml  Net            -2047 ml   Filed Weights   06/25/16 1301 06/25/16 2022 06/27/16 0500  Weight: 127 kg (280 lb) 133 kg (293 lb 3.4 oz) 132.7 kg (292 lb 8.8 oz)    Examination:  General exam: Appears calm and comfortable Respiratory system: Clear to auscultation. Respiratory effort normal. Cardiovascular system: S1 & S2 heard, Normal rate with regular rhythm Gastrointestinal system: Abdomen is nondistended, soft and nontender. Normal bowel sounds heard. Central nervous system: Alert and oriented. Extremities: No edema. No calf tenderness Skin: No cyanosis. No rashes Psychiatry: Judgement and insight appear normal. Mood & affect appropriate.     Data Reviewed: I have personally reviewed following labs and imaging studies  CBC:  Recent Labs Lab 06/25/16 1635 06/27/16 1143 06/27/16 1314 06/27/16 1409 06/28/16 0447 06/29/16 0436  WBC 8.0  --   --   --  10.1 12.5*  NEUTROABS 3.7  --   --   --   --   --   HGB 12.5* 9.9* 6.5* 8.8* 10.5* 11.4*  HCT 37.6* 29.0* 19.0* 26.0* 30.6* 33.9*  MCV 89.1  --   --   --  86.9 88.3  PLT 307  --   --   --  201 852   Basic Metabolic Panel:  Recent Labs Lab 06/25/16 1635 06/27/16 1143 06/27/16 1314 06/27/16 1409 06/28/16 0447 06/29/16 0436  NA 137 139 136 138 138 135  K 4.4 3.8 4.7 4.3 4.4 3.9  CL 106  --   --   --  107 105  CO2 26  --   --   --  23 22  GLUCOSE 116*  --   --  179* 131* 150*  BUN 19  --   --   --  16 14  CREATININE 1.15  --   --   --  0.97 0.90  CALCIUM 9.4  --   --   --  8.5* 8.5*    GFR: Estimated Creatinine Clearance: 115.3 mL/min (by C-G formula based on SCr of 0.9 mg/dL). Liver Function Tests:  Recent Labs Lab 06/25/16 1635  AST 24  ALT 27  ALKPHOS 99  BILITOT 0.8  PROT 7.3  ALBUMIN 3.7   No results for input(s): LIPASE, AMYLASE in the last 168 hours. No results for input(s): AMMONIA in the last 168 hours. Coagulation Profile: No results for input(s): INR, PROTIME in the last 168 hours. Cardiac Enzymes:  Recent Labs Lab 06/28/16 0826 06/28/16 1412 06/28/16 2014  TROPONINI <0.03 <0.03 <0.03   BNP (last 3 results) No results for input(s): PROBNP in the last 8760 hours. HbA1C: No results for input(s): HGBA1C in the last 72 hours. CBG:  Recent Labs Lab 07/01/16 0725 07/01/16 1110 07/01/16 1749 07/01/16 2118 07/02/16 0739  GLUCAP 152* 170* 162* 154* 140*   Lipid Profile:  Recent Labs  06/30/16 1625  TRIG 76   Thyroid Function Tests: No results for input(s): TSH, T4TOTAL, FREET4, T3FREE, THYROIDAB in the last 72 hours. Anemia Panel: No results for input(s): VITAMINB12, FOLATE, FERRITIN, TIBC, IRON, RETICCTPCT in the last 72 hours. Sepsis Labs: No results for input(s): PROCALCITON, LATICACIDVEN in the last 168 hours.  Recent Results (from the past 240 hour(s))  MRSA PCR Screening     Status: None   Collection Time: 06/27/16  4:40 PM  Result Value Ref Range Status   MRSA by PCR NEGATIVE NEGATIVE Final    Comment:        The GeneXpert MRSA Assay (FDA approved for NASAL specimens only), is one component of a comprehensive MRSA colonization surveillance program. It is not intended to diagnose MRSA infection nor to guide or monitor treatment for MRSA infections.          Radiology Studies: Dg Swallowing Func-speech Pathology  Result Date: 07/01/2016 Objective Swallowing Evaluation: Type of Study: MBS-Modified Barium Swallow Study Patient Details Name: Rodney Crane MRN: [id number] Date of Birth: 02/20/1951 Today's Date:  07/01/2016 Time: SLP Start Time (ACUTE ONLY): 0930-SLP Stop Time (ACUTE ONLY): 1000 SLP Time Calculation (min) (ACUTE ONLY): 30 min Past Medical History: Past Medical History: Diagnosis Date . Arthritis  . Colon polyps  . CVA (cerebral infarction) 2010 . Diabetes mellitus  . Dizziness  . ED (erectile dysfunction)  . Foot drop  . H/O Bell's palsy  . Hyperlipidemia  . Hypertension  . Obesity  . OSA on CPAP  .  Spinal stenosis  . Stroke ([place])  . Unsteady gait  Past Surgical History: Past Surgical History: Procedure Laterality Date . CARPAL TUNNEL RELEASE    bilateral . CERVICAL DISCECTOMY  2004 . LUMBAR DISC SURGERY    x6 . POSTERIOR CERVICAL FUSION/FORAMINOTOMY N/A 06/27/2016  Procedure: POSTERIOR CERVICAL FUSION C2-C5; C2-C4 Decompression;  Surgeon: Melina Schools, MD;  Location: [facility];  Service: Orthopedics;  Laterality: N/A; HPI: Patient is a 67 y/o male CVA, HTN, HLD, DM, smoker, OSA on CPAP presents with central cord syndrome s/p C2-5 posterior cervical fusion and C2-4 decompression 3/4. With compromised airway after surgery, extubated 3/6. Subjective: pleasant, good historian Assessment / Plan / Recommendation CHL IP CLINICAL IMPRESSIONS 07/01/2016 Clinical Impression Pt presents with acute dysphagia s/p cervical surgery, leading to edema of posterior pharyngeal wall.  Edema inhibits adequate pharyngeal stripping of bolus and full epiglottic closure over larynx.  Purees tend to remain in vallecular and pyriform spaces; thin liquids help clear residue.  Pt is able to clear residue with multiple sub-swallows per bolus.  He protected airway well when drinking thin liquids - no penetration nor aspiration.  Recommend continue Dysphagia 1, thin liquids for now.  SLP will follow for safety/diet progression.  SLP Visit Diagnosis Dysphagia, pharyngeal phase (R13.13) Attention and concentration deficit following -- Frontal lobe and executive function deficit following -- Impact on safety and function Mild aspiration risk   CHL IP  TREATMENT RECOMMENDATION 07/01/2016 Treatment Recommendations Therapy as outlined in treatment plan below   Prognosis 07/01/2016 Prognosis for Safe Diet Advancement Good Barriers to Reach Goals -- Barriers/Prognosis Comment -- CHL IP DIET RECOMMENDATION 07/01/2016 SLP Diet Recommendations Dysphagia 1 (Puree) solids;Thin liquid Liquid Administration via Cup;Straw Medication Administration Crushed with puree Compensations Slow rate;Small sips/bites Postural Changes Seated upright at 90 degrees   CHL IP OTHER RECOMMENDATIONS 07/01/2016 Recommended Consults -- Oral Care Recommendations Oral care BID Other Recommendations --   CHL IP FOLLOW UP RECOMMENDATIONS 07/01/2016 Follow up Recommendations Inpatient Rehab   CHL IP FREQUENCY AND DURATION 07/01/2016 Speech Therapy Frequency (ACUTE ONLY) min 2x/week Treatment Duration 1 week      CHL IP ORAL PHASE 07/01/2016 Oral Phase WFL Oral - Pudding Teaspoon -- Oral - Pudding Cup -- Oral - Honey Teaspoon -- Oral - Honey Cup -- Oral - Nectar Teaspoon -- Oral - Nectar Cup -- Oral - Nectar Straw -- Oral - Thin Teaspoon -- Oral - Thin Cup -- Oral - Thin Straw -- Oral - Puree -- Oral - Mech Soft -- Oral - Regular -- Oral - Multi-Consistency -- Oral - Pill -- Oral Phase - Comment --  CHL IP PHARYNGEAL PHASE 07/01/2016 Pharyngeal Phase Impaired Pharyngeal- Pudding Teaspoon -- Pharyngeal -- Pharyngeal- Pudding Cup -- Pharyngeal -- Pharyngeal- Honey Teaspoon -- Pharyngeal -- Pharyngeal- Honey Cup -- Pharyngeal -- Pharyngeal- Nectar Teaspoon -- Pharyngeal -- Pharyngeal- Nectar Cup -- Pharyngeal -- Pharyngeal- Nectar Straw -- Pharyngeal -- Pharyngeal- Thin Teaspoon -- Pharyngeal -- Pharyngeal- Thin Cup -- Pharyngeal -- Pharyngeal- Thin Straw Reduced pharyngeal peristalsis;Reduced epiglottic inversion;Pharyngeal residue - valleculae;Pharyngeal residue - pyriform Pharyngeal -- Pharyngeal- Puree Reduced pharyngeal peristalsis;Reduced epiglottic inversion;Pharyngeal residue - valleculae;Pharyngeal residue -  pyriform Pharyngeal -- Pharyngeal- Mechanical Soft -- Pharyngeal -- Pharyngeal- Regular -- Pharyngeal -- Pharyngeal- Multi-consistency -- Pharyngeal -- Pharyngeal- Pill -- Pharyngeal -- Pharyngeal Comment --  No flowsheet data found. CHL IP GO 01/13/2015 Functional Assessment Tool Used noms Functional Limitations Motor speech Swallow Current Status [phone]) (None) Swallow Goal Status (I3474) (None) Swallow Discharge Status (Q5956) (None) Motor Speech  Current Status [phone]) [place] Motor Speech Goal Status [phone]) [place] Motor Speech Goal Status [phone]) [place] Spoken Language Comprehension Current Status [phone]) (None) Spoken Language Comprehension Goal Status (P8099) (None) Spoken Language Comprehension Discharge Status [phone]) (None) Spoken Language Expression Current Status [phone]) (None) Spoken Language Expression Goal Status [phone]) (None) Spoken Language Expression Discharge Status [phone]) (None) Attention Current Status (K2409) (None) Attention Goal Status (B3532) (None) Attention Discharge Status [phone]) (None) Memory Current Status (A8341) (None) Memory Goal Status (D6222) (None) Memory Discharge Status (L7989) (None) Voice Current Status (Q1194) (None) Voice Goal Status (R7408) (None) Voice Discharge Status [phone]) (None) Other Speech-Language Pathology Functional Limitation Current Status [phone]) (None) Other Speech-Language Pathology Functional Limitation Goal Status (S9702) (None) Other Speech-Language Pathology Functional Limitation Discharge Status [phone]) (None) Juan Quam Laurice 07/01/2016, 10:45 AM                   Scheduled Meds: . insulin aspart  0-20 Units Subcutaneous TID AC & HS  . irbesartan  300 mg Oral Daily  . metoprolol succinate  50 mg Oral Daily  . pantoprazole  40 mg Oral q1800  . sodium chloride flush  3 mL Intravenous Q12H   Continuous Infusions: . sodium chloride    . lactated ringers Stopped (06/30/16 0455)     LOS: 7 days     Cordelia Poche [facility] 07/02/2016, 7:43 AM Pager: [phone]  If 7PM-7AM, please contact night-coverage [email or web address] Password [facility] 07/02/2016, 7:43 AM

## 2016-07-02 NOTE — Progress Notes (Signed)
Patient placed on nasal pillow CPAP home mask and doing well.

## 2016-07-02 NOTE — Progress Notes (Signed)
Physical Therapy Treatment Patient Details Name: Rodney Crane MRN: [id number] DOB: 03-Oct-1950 Today's Date: 07/02/2016    History of Present Illness Patient is a 66 y/o male CVA, HTN, HLD, DM, smoker, OSA on CPAP presents with central cord syndrome s/p C2-5 posterior cervical fusion and C2-4 decompression; with compromised airway after surgery; extubated 3/6.    PT Comments    Patient seen for activity progression, patient tolerated increased time EOB with improvements in dynamic control during  During trunk control activities. Patient also able to tolerate pre gait static standing with upright facilitation today in addition to transfer OOB to chair. Will continue to see and progress as tolerated.  Follow Up Recommendations  CIR     Equipment Recommendations  None recommended by PT    Recommendations for Other Services Rehab consult     Precautions / Restrictions Precautions Precautions: Cervical Required Braces or Orthoses: Cervical Brace Cervical Brace: Hard collar;Other (comment) Restrictions Weight Bearing Restrictions: No    Mobility  Bed Mobility Overal bed mobility: Needs Assistance Bed Mobility: Rolling;Supine to Sit Rolling: +2 for physical assistance;Max assist   Supine to sit: +2 for physical assistance;Mod assist     General bed mobility comments: patient able initiation and hold rail during transition, increased assist to power to upright and reposition at EOB  Transfers Overall transfer level: Needs assistance Equipment used: 2 person hand held assist (face to face with gait belt) Transfers: Sit to/from Stand Sit to Stand: Mod assist;+2 physical assistance (2 person face to face with gait belt ) Stand pivot transfers: Max assist;+2 physical assistance       General transfer comment: performed sit <> stand x2 and Stand pivot OOB to chair with tactile faciliatation at right hip extensor reaction with bilateral LE knee blocking. Patient tolerated  increased time in standing but did show some signs of orthostatsis reaction, recommend LE wrapping during upright activity going forward.   Ambulation/Gait                 Stairs            Wheelchair Mobility    Modified Rankin (Stroke Patients Only)       Balance Overall balance assessment: Needs assistance Sitting-balance support: Bilateral upper extremity supported;Feet supported Sitting balance-Leahy Scale: Fair Sitting balance - Comments: improved sitting balance, able to perform static and dynamic activity at EOB today   Standing balance support: Bilateral upper extremity supported;During functional activity Standing balance-Leahy Scale: Poor Standing balance comment: patient able to tolerate upright static standing >30 second bouts x2 (some evidence of orthostatic reaction. Pt did required initiate tactile faciliatation to upright but once upright able to lock-set LEs and maintain standing with 2 person bilateral moderate to max assist.                     Cognition Arousal/Alertness: Awake/alert Behavior During Therapy: WFL for tasks assessed/performed Overall Cognitive Status: Within Functional Limits for tasks assessed                      Exercises Other Exercises Other Exercises: dynamic trunk control activities at EOB Other Exercises: Sit <> stand trials with ~30 secon in standing (limited by some orthostatic symptoms    General Comments        Pertinent Vitals/Pain Pain Assessment: Faces Faces Pain Scale: Hurts little more Pain Location: low back during position in bed Pain Descriptors / Indicators: Aching Pain Intervention(s): Monitored during session    Home  Living                      Prior Function            PT Goals (current goals can now be found in the care plan section) Acute Rehab PT Goals Patient Stated Goal: to take my wife on a vacation PT Goal Formulation: With patient Time For Goal Achievement:  07/12/16 Potential to Achieve Goals: Good Progress towards PT goals: Progressing toward goals    Frequency    Min 5X/week      PT Plan Current plan remains appropriate    Co-evaluation             End of Session Equipment Utilized During Treatment: Gait belt Activity Tolerance: Patient tolerated treatment well Patient left: in chair;with call bell/phone within reach;with family/visitor present Nurse Communication: Mobility status;Need for lift equipment PT Visit Diagnosis: Muscle weakness (generalized) (M62.81);Difficulty in walking, not elsewhere classified (R26.2) Pain - part of body:  (Neck and back)     Time: 9518-8416 PT Time Calculation (min) (ACUTE ONLY): 35 min  Charges:  $Therapeutic Activity: 8-22 mins $Neuromuscular Re-education: 8-22 mins                    G Codes:       Duncan Dull 07/25/16, 12:02 PM Alben Deeds, [place] DPT  [phone]

## 2016-07-02 NOTE — Progress Notes (Signed)
Subjective: 5 Days Post-Op Procedure(s) (LRB): POSTERIOR CERVICAL FUSION C2-C5; C2-C4 Decompression (N/A) Patient reports pain as mild.  Tolerating soft diet.  No c/o.  Objective: Vital signs in last 24 hours: Temp:  [98.1 F (36.7 C)-99.1 F (37.3 C)] 98.6 F (37 C) (03/10 0350) Pulse Rate:  [75-95] 84 (03/10 0800) Resp:  [14-20] 18 (03/10 0800) BP: (137-190)/(71-98) 158/83 (03/10 0800) SpO2:  [99 %-100 %] 100 % (03/10 0800)  Intake/Output from previous day: 03/09 0701 - 03/10 0700 In: 3 [I.V.:3] Out: 2050 [Urine:2050] Intake/Output this shift: No intake/output data recorded.  No results for input(s): HGB in the last 72 hours. No results for input(s): WBC, RBC, HCT, PLT in the last 72 hours. No results for input(s): NA, K, CL, CO2, BUN, CREATININE, GLUCOSE, CALCIUM in the last 72 hours. No results for input(s): LABPT, INR in the last 72 hours.  PE:  Cervical spine wound dressed and dry.  5/5 strength at biceps, triceps and grip.  Sens to LT unchanged in hands.  Assessment/Plan: 5 Days Post-Op Procedure(s) (LRB): POSTERIOR CERVICAL FUSION C2-C5; C2-C4 Decompression (N/A) tx to 5th floor inpt.  L spine surgery pending this week.  Continue PT and soft diet.  Wylene Simmer 07/02/2016, 8:54 AM

## 2016-07-03 DIAGNOSIS — K5900 Constipation, unspecified: Secondary | ICD-10-CM

## 2016-07-03 LAB — GLUCOSE, CAPILLARY
GLUCOSE-CAPILLARY: 173 mg/dL — AB (ref 65–99)
Glucose-Capillary: 120 mg/dL — ABNORMAL HIGH (ref 65–99)
Glucose-Capillary: 207 mg/dL — ABNORMAL HIGH (ref 65–99)
Glucose-Capillary: 121 mg/dL — ABNORMAL HIGH (ref 65–99)

## 2016-07-03 LAB — TRIGLYCERIDES: TRIGLYCERIDES: 102 mg/dL (ref <150)

## 2016-07-03 MED ORDER — SENNOSIDES-DOCUSATE SODIUM 8.6-50 MG PO TABS
1.0000 | ORAL_TABLET | Freq: Every day | ORAL | Status: DC
  Administered 2016-07-03: 1 via ORAL
  Filled 2016-07-03: qty 1

## 2016-07-03 MED ORDER — POLYETHYLENE GLYCOL 3350 17 G PO PACK: 17.0000 g | PACK | Freq: Every day | ORAL | Status: DC

## 2016-07-03 MED ORDER — SENNOSIDES-DOCUSATE SODIUM 8.6-50 MG PO TABS: 1.0000 | ORAL_TABLET | Freq: Once | ORAL | Status: DC

## 2016-07-03 MED ORDER — POLYETHYLENE GLYCOL 3350 17 G PO PACK
17.0000 g | PACK | Freq: Two times a day (BID) | ORAL | Status: DC
  Administered 2016-07-03 – 2016-07-04 (×2): 17 g via ORAL
  Filled 2016-07-03 (×2): qty 1

## 2016-07-03 NOTE — Progress Notes (Signed)
Patient ID: Rodney Crane, male   DOB: [birth year], 66 y.o.   MRN: [id number] Subjective: 6 Days Post-Op Procedure(s) (LRB): POSTERIOR CERVICAL FUSION C2-C5; C2-C4 Decompression (N/A)    Patient reports pain as mild.  Just uncomfortable with regards to bed.  Out of ICU last night.  No events or concerns Awaiting planned surgery to L-spine this week per [facility].  They are waiting for his return to review plans  Objective:   VITALS:   Vitals:   07/03/16 0500 07/03/16 0943  BP: (!) 149/78 (!) 144/67  Pulse: 71 91  Resp: 17 18  Temp: 97.7 F (36.5 C) 99 F (37.2 C)    Neurovascular intact Incision: dressing C/D/I  LABS No results for input(s): HGB, HCT, WBC, PLT in the last 72 hours.  No results for input(s): NA, K, BUN, CREATININE, GLUCOSE in the last 72 hours.  No results for input(s): LABPT, INR in the last 72 hours.   Assessment/Plan: 6 Days Post-Op Procedure(s) (LRB): POSTERIOR CERVICAL FUSION C2-C5; C2-C4 Decompression (N/A)  Plan: Pending Dr. Rolena Infante return Report of upcoming L-spin surgery this week Otherwise continue same level of care

## 2016-07-03 NOTE — Progress Notes (Signed)
PROGRESS NOTE    Rodney Crane  JJH:[id number] DOB: 02-Feb-1951 DOA: 06/25/2016 PCP: Marton Redwood, MD   Brief Narrative: Rodney Crane is a 66 y.o. male with a history of CVA, diabetes mellitus, hypertension, OSA on CPAP. He presented with weakness of his legs and arms and was found to have cervical stenosis of the renal canal. Status post decompression and lesion on 06/26/2016. Disposition is CIR.   Assessment & Plan:   Principal Problem:   Cervical stenosis of spinal canal Active Problems:   Essential hypertension   Weakness of extremity   OSA (obstructive sleep apnea)   Hypertension   Diabetes ([place])   Spinal stenosis in cervical region   Acute hypoxemic respiratory failure (HCC)   Myelopathy (HCC)   OSA on CPAP   Surgery, elective   History of CVA (cerebrovascular accident)   Benign essential HTN   History of cervical spinal surgery   History of lumbar surgery   Tobacco abuse   Diastolic dysfunction   Acute blood loss anemia   Post-operative pain   Tachycardia   Leukocytosis   Spinal stenosis of lumbar region   Central cord syndrome ([place])   Cervical stenosis spinal canal Patient is status post decompression C2-C4 and fusion C2-C5 on 06/26/2016 -Orthopedic surgery recommendations: plan for laminectomy this upcoming week -CIR for disposition -pain management  Essential hypertension Better controlled now.  -Continue labetalol when necessary IV -Continue ARB -Continue home metoprolol -Continuehydrochlorothiazide 12.5mg   Obstructive sleep apnea -Continue CPAP  Diabetes mellitus Hemoglobin A1C of 6 -continue SSI  Constipation Passing gas. No abdominal pain. -start bowel regimen  DVT prophylaxis: SCDs Code Status: Full code  Family Communication: None at bedside  Disposition Plan: Discharge CIR when available    Consultants:   Orthopedic surgery (Dr. Rolena Infante)  Procedures:  Decompression of C2-C4  Fusion of C2-C5  Antimicrobials:  None     Subjective: Back pain is controlled. No chest pain or dyspnea. No bowel movement in weeks per patient. No abdominal pain.  Objective: Vitals:   07/02/16 2348 07/03/16 0100 07/03/16 0500 07/03/16 0943  BP:  138/80 (!) 149/78 (!) 144/67  Pulse: 89 90 71 91  Resp: 16 17 17 18   Temp:  98.7 F (37.1 C) 97.7 F (36.5 C) 99 F (37.2 C)  TempSrc:  Oral Oral Oral  SpO2: 100% 100% 100% 100%  Weight:      Height:        Intake/Output Summary (Last 24 hours) at 07/03/16 1155 Last data filed at 07/03/16 0724  Gross per 24 hour  Intake                0 ml  Output              300 ml  Net             -300 ml   Filed Weights   06/25/16 2022 06/27/16 0500 07/02/16 1903  Weight: 133 kg (293 lb 3.4 oz) 132.7 kg (292 lb 8.8 oz) 133.7 kg (294 lb 11.2 oz)    Examination:  General exam: Appears calm and comfortable Respiratory system: Clear to auscultation. Respiratory effort normal. Cardiovascular system: S1 & S2 heard, Normal rate with regular rhythm Gastrointestinal system: Abdomen is distended, soft and nontender. Normal bowel sounds heard. Central nervous system: Alert and oriented. Right leg 3/5 strength. Diminished sensation. Extremities: No edema. No calf tenderness Skin: No cyanosis. No rashes Psychiatry: Judgement and insight appear normal. Mood & affect appropriate.     Data  Reviewed: I have personally reviewed following labs and imaging studies  CBC:  Recent Labs Lab 06/27/16 1143 06/27/16 1314 06/27/16 1409 06/28/16 0447 06/29/16 0436  WBC  --   --   --  10.1 12.5*  HGB 9.9* 6.5* 8.8* 10.5* 11.4*  HCT 29.0* 19.0* 26.0* 30.6* 33.9*  MCV  --   --   --  86.9 88.3  PLT  --   --   --  201 245   Basic Metabolic Panel:  Recent Labs Lab 06/27/16 1143 06/27/16 1314 06/27/16 1409 06/28/16 0447 06/29/16 0436  NA 139 136 138 138 135  K 3.8 4.7 4.3 4.4 3.9  CL  --   --   --  107 105  CO2  --   --   --  23 22  GLUCOSE  --   --  179* 131* 150*  BUN  --   --    --  16 14  CREATININE  --   --   --  0.97 0.90  CALCIUM  --   --   --  8.5* 8.5*   GFR: Estimated Creatinine Clearance: 120.6 mL/min (by C-G formula based on SCr of 0.9 mg/dL). Liver Function Tests: No results for input(s): AST, ALT, ALKPHOS, BILITOT, PROT, ALBUMIN in the last 168 hours. No results for input(s): LIPASE, AMYLASE in the last 168 hours. No results for input(s): AMMONIA in the last 168 hours. Coagulation Profile: No results for input(s): INR, PROTIME in the last 168 hours. Cardiac Enzymes:  Recent Labs Lab 06/28/16 0826 06/28/16 1412 06/28/16 2014  TROPONINI <0.03 <0.03 <0.03   BNP (last 3 results) No results for input(s): PROBNP in the last 8760 hours. HbA1C: No results for input(s): HGBA1C in the last 72 hours. CBG:  Recent Labs Lab 07/02/16 1135 07/02/16 1654 07/02/16 2228 07/03/16 0622 07/03/16 1105  GLUCAP 177* 146* 156* 120* 173*   Lipid Profile:  Recent Labs  06/30/16 1625  TRIG 76   Thyroid Function Tests: No results for input(s): TSH, T4TOTAL, FREET4, T3FREE, THYROIDAB in the last 72 hours. Anemia Panel: No results for input(s): VITAMINB12, FOLATE, FERRITIN, TIBC, IRON, RETICCTPCT in the last 72 hours. Sepsis Labs: No results for input(s): PROCALCITON, LATICACIDVEN in the last 168 hours.  Recent Results (from the past 240 hour(s))  MRSA PCR Screening     Status: None   Collection Time: 06/27/16  4:40 PM  Result Value Ref Range Status   MRSA by PCR NEGATIVE NEGATIVE Final    Comment:        The GeneXpert MRSA Assay (FDA approved for NASAL specimens only), is one component of a comprehensive MRSA colonization surveillance program. It is not intended to diagnose MRSA infection nor to guide or monitor treatment for MRSA infections.          Radiology Studies: No results found.      Scheduled Meds: . hydrochlorothiazide  12.5 mg Oral Daily  . insulin aspart  0-20 Units Subcutaneous TID AC & HS  . irbesartan  300 mg  Oral Daily  . metoprolol succinate  50 mg Oral Daily  . pantoprazole  40 mg Oral q1800  . polyethylene glycol  17 g Oral BID  . senna-docusate  1 tablet Oral QHS  . sodium chloride flush  3 mL Intravenous Q12H   Continuous Infusions: . sodium chloride       LOS: 8 days     Cordelia Poche [facility] 07/03/2016, 11:55 AM Pager: [phone]  If 7PM-7AM,  please contact night-coverage [email or web address] Password [email or web address] 07/03/2016, 11:55 AM

## 2016-07-03 NOTE — Progress Notes (Signed)
    Subjective: Procedure(s) (LRB): POSTERIOR CERVICAL FUSION C2-C5; C2-C4 Decompression (N/A) 6 Days Post-Op  Patient reports pain as 2 on 0-10 scale.  Reports deferred at this time arm pain reports incisional neck pain   Positive void Negative bowel movement Positive flatus Negative chest pain or shortness of breath  Objective: Vital signs in last 24 hours: Temp:  [97.7 F (36.5 C)-99.5 F (37.5 C)] 99.1 F (37.3 C) (03/11 1256) Pulse Rate:  [71-91] 88 (03/11 1256) Resp:  [16-18] 18 (03/11 1256) BP: (133-149)/(67-82) 145/76 (03/11 1256) SpO2:  [97 %-100 %] 97 % (03/11 1256) Weight:  [133.7 kg (294 lb 11.2 oz)] 133.7 kg (294 lb 11.2 oz) (03/10 1903)  Intake/Output from previous day: 03/10 0701 - 03/11 0700 In: 480 [P.O.:480] Out: 550 [Urine:550]  Labs: No results for input(s): WBC, RBC, HCT, PLT in the last 72 hours. No results for input(s): NA, K, CL, CO2, BUN, CREATININE, GLUCOSE, CALCIUM in the last 72 hours. No results for input(s): LABPT, INR in the last 72 hours.  Physical Exam: ABD soft Intact pulses distally Incision: dressing C/D/I Compartment soft  Neuro UE: 3/5 motor except for grip strength (2/5)  Fine motor control remains limited in the hand  Sensation to LT grossly intact in the UE  LE: 3/5 motor throughout (able to stand briefly - unable to ambulate)  Sensation to LT intact   Assessment/Plan: Patient stable  Mobilization with physical therapy 1. Patient with history of TIA/stroke which produced right sided weakness.  However his overall neurologic condition has rapidly declined since the fall in early Feb. 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  I am still optimistic that he will regain strength to ambulate (most likely household).   2. Previous lumbar MRI done December 2017.  Multiple issues identified.  Significant spinal stenosis L1-S1.  Previous lumbar decompression L2-S1. Grade 1 degenerative  slip L3/4.  Multi-level facet arthrosis contributing to lateral recess stenosis.  I am concerned about revision decompression of spinal stenosis.  This is an extensive procedure and would effect recover from cervical decompression.  I think the best option is to start CIR for the cervical spine and when he recovers then consider revision lumbar decompression and fusion. 3. Will start BID dry dressing changes.  Incision healing well  4. Will continue to monitor progress  Melina Schools, MD [place] [phone]

## 2016-07-03 NOTE — Progress Notes (Signed)
Placed patient on CPAP via nasal pillows (home equipment) previous settings of 4.5 cm H20 per pt home settings.

## 2016-07-04 ENCOUNTER — Inpatient Hospital Stay (HOSPITAL_COMMUNITY): Payer: Medicare Other

## 2016-07-04 ENCOUNTER — Inpatient Hospital Stay (HOSPITAL_COMMUNITY)
Admission: RE | Admit: 2016-07-04 | Discharge: 2016-07-20 | DRG: 559 | Disposition: A | Discharge status: Home Health | Payer: Medicare Other | Source: Intra-hospital | Attending: Physical Medicine & Rehabilitation | Admitting: Physical Medicine & Rehabilitation

## 2016-07-04 DIAGNOSIS — Z8673 Personal history of transient ischemic attack (TIA), and cerebral infarction without residual deficits: Secondary | ICD-10-CM | POA: Diagnosis not present

## 2016-07-04 DIAGNOSIS — E1142 Type 2 diabetes mellitus with diabetic polyneuropathy: Secondary | ICD-10-CM | POA: Diagnosis present

## 2016-07-04 DIAGNOSIS — K592 Neurogenic bowel, not elsewhere classified: Secondary | ICD-10-CM | POA: Diagnosis present

## 2016-07-04 DIAGNOSIS — Z79899 Other long term (current) drug therapy: Secondary | ICD-10-CM

## 2016-07-04 DIAGNOSIS — Z7902 Long term (current) use of antithrombotics/antiplatelets: Secondary | ICD-10-CM

## 2016-07-04 DIAGNOSIS — M7989 Other specified soft tissue disorders: Secondary | ICD-10-CM | POA: Diagnosis not present

## 2016-07-04 DIAGNOSIS — D62 Acute posthemorrhagic anemia: Secondary | ICD-10-CM | POA: Diagnosis present

## 2016-07-04 DIAGNOSIS — R109 Unspecified abdominal pain: Secondary | ICD-10-CM

## 2016-07-04 DIAGNOSIS — I82403 Acute embolism and thrombosis of unspecified deep veins of lower extremity, bilateral: Secondary | ICD-10-CM | POA: Diagnosis not present

## 2016-07-04 DIAGNOSIS — S14129S Central cord syndrome at unspecified level of cervical spinal cord, sequela: Secondary | ICD-10-CM

## 2016-07-04 DIAGNOSIS — G4733 Obstructive sleep apnea (adult) (pediatric): Secondary | ICD-10-CM | POA: Diagnosis present

## 2016-07-04 DIAGNOSIS — E785 Hyperlipidemia, unspecified: Secondary | ICD-10-CM

## 2016-07-04 DIAGNOSIS — R140 Abdominal distension (gaseous): Secondary | ICD-10-CM

## 2016-07-04 DIAGNOSIS — M79609 Pain in unspecified limb: Secondary | ICD-10-CM | POA: Diagnosis not present

## 2016-07-04 DIAGNOSIS — Z981 Arthrodesis status: Secondary | ICD-10-CM | POA: Diagnosis not present

## 2016-07-04 DIAGNOSIS — F17210 Nicotine dependence, cigarettes, uncomplicated: Secondary | ICD-10-CM | POA: Diagnosis present

## 2016-07-04 DIAGNOSIS — G8250 Quadriplegia, unspecified: Secondary | ICD-10-CM | POA: Diagnosis present

## 2016-07-04 DIAGNOSIS — S14129D Central cord syndrome at unspecified level of cervical spinal cord, subsequent encounter: Secondary | ICD-10-CM

## 2016-07-04 DIAGNOSIS — I82442 Acute embolism and thrombosis of left tibial vein: Secondary | ICD-10-CM | POA: Diagnosis present

## 2016-07-04 DIAGNOSIS — R296 Repeated falls: Secondary | ICD-10-CM | POA: Diagnosis present

## 2016-07-04 DIAGNOSIS — E1165 Type 2 diabetes mellitus with hyperglycemia: Secondary | ICD-10-CM | POA: Diagnosis not present

## 2016-07-04 DIAGNOSIS — K5900 Constipation, unspecified: Secondary | ICD-10-CM | POA: Diagnosis present

## 2016-07-04 DIAGNOSIS — I10 Essential (primary) hypertension: Secondary | ICD-10-CM | POA: Diagnosis present

## 2016-07-04 DIAGNOSIS — S14129A Central cord syndrome at unspecified level of cervical spinal cord, initial encounter: Secondary | ICD-10-CM | POA: Diagnosis present

## 2016-07-04 DIAGNOSIS — Z4789 Encounter for other orthopedic aftercare: Secondary | ICD-10-CM | POA: Diagnosis not present

## 2016-07-04 DIAGNOSIS — I824Z3 Acute embolism and thrombosis of unspecified deep veins of distal lower extremity, bilateral: Secondary | ICD-10-CM | POA: Insufficient documentation

## 2016-07-04 DIAGNOSIS — Z7984 Long term (current) use of oral hypoglycemic drugs: Secondary | ICD-10-CM | POA: Diagnosis not present

## 2016-07-04 DIAGNOSIS — K5903 Drug induced constipation: Secondary | ICD-10-CM

## 2016-07-04 LAB — GLUCOSE, CAPILLARY
GLUCOSE-CAPILLARY: 130 mg/dL — AB (ref 65–99)
Glucose-Capillary: 140 mg/dL — ABNORMAL HIGH (ref 65–99)
Glucose-Capillary: 139 mg/dL — ABNORMAL HIGH (ref 65–99)
Glucose-Capillary: 143 mg/dL — ABNORMAL HIGH (ref 65–99)
Glucose-Capillary: 155 mg/dL — ABNORMAL HIGH (ref 65–99)

## 2016-07-04 MED ORDER — INSULIN ASPART 100 UNIT/ML ~~LOC~~ SOLN
0.0000 [IU] | Freq: Three times a day (TID) | SUBCUTANEOUS | Status: DC
  Administered 2016-07-04: 4 [IU] via SUBCUTANEOUS
  Administered 2016-07-05: 7 [IU] via SUBCUTANEOUS
  Administered 2016-07-05: 4 [IU] via SUBCUTANEOUS
  Administered 2016-07-05 (×2): 3 [IU] via SUBCUTANEOUS
  Administered 2016-07-06: 4 [IU] via SUBCUTANEOUS
  Administered 2016-07-06 – 2016-07-07 (×3): 3 [IU] via SUBCUTANEOUS
  Administered 2016-07-07: 4 [IU] via SUBCUTANEOUS
  Administered 2016-07-08 (×4): 3 [IU] via SUBCUTANEOUS
  Administered 2016-07-09: 4 [IU] via SUBCUTANEOUS
  Administered 2016-07-09 – 2016-07-10 (×3): 3 [IU] via SUBCUTANEOUS
  Administered 2016-07-10: 4 [IU] via SUBCUTANEOUS
  Administered 2016-07-11 (×4): 3 [IU] via SUBCUTANEOUS
  Administered 2016-07-12: 4 [IU] via SUBCUTANEOUS
  Administered 2016-07-12: 3 [IU] via SUBCUTANEOUS
  Administered 2016-07-12 – 2016-07-13 (×2): 4 [IU] via SUBCUTANEOUS
  Administered 2016-07-13: 3 [IU] via SUBCUTANEOUS
  Administered 2016-07-14: 4 [IU] via SUBCUTANEOUS
  Administered 2016-07-14 – 2016-07-18 (×10): 3 [IU] via SUBCUTANEOUS
  Administered 2016-07-18 – 2016-07-19 (×2): 4 [IU] via SUBCUTANEOUS
  Administered 2016-07-19: 3 [IU] via SUBCUTANEOUS
  Administered 2016-07-19: 4 [IU] via SUBCUTANEOUS
  Administered 2016-07-20: 3 [IU] via SUBCUTANEOUS

## 2016-07-04 MED ORDER — FLEET ENEMA 7-19 GM/118ML RE ENEM
1.0000 | ENEMA | Freq: Every day | RECTAL | Status: DC | PRN
  Administered 2016-07-04: 1 via RECTAL
  Filled 2016-07-04: qty 1

## 2016-07-04 MED ORDER — BISACODYL 10 MG RE SUPP: 10.0000 mg | Freq: Every day | RECTAL | Status: DC | PRN

## 2016-07-04 MED ORDER — PANTOPRAZOLE SODIUM 40 MG PO TBEC: 40.0000 mg | DELAYED_RELEASE_TABLET | Freq: Every day | ORAL | Status: DC

## 2016-07-04 MED ORDER — PANTOPRAZOLE SODIUM 40 MG PO TBEC
40.0000 mg | DELAYED_RELEASE_TABLET | Freq: Every day | ORAL | Status: DC
  Administered 2016-07-04 – 2016-07-19 (×16): 40 mg via ORAL
  Filled 2016-07-04 (×16): qty 1

## 2016-07-04 MED ORDER — SORBITOL 70 % SOLN: 30.0000 mL | Freq: Every day | Status: DC | PRN

## 2016-07-04 MED ORDER — METHOCARBAMOL 500 MG PO TABS
500.0000 mg | ORAL_TABLET | Freq: Four times a day (QID) | ORAL | Status: DC | PRN
  Administered 2016-07-04 – 2016-07-19 (×28): 500 mg via ORAL
  Filled 2016-07-04 (×30): qty 1

## 2016-07-04 MED ORDER — ATORVASTATIN CALCIUM 20 MG PO TABS
20.0000 mg | ORAL_TABLET | Freq: Every day | ORAL | Status: DC
  Administered 2016-07-04 – 2016-07-19 (×16): 20 mg via ORAL
  Filled 2016-07-04 (×16): qty 1

## 2016-07-04 MED ORDER — POLYETHYLENE GLYCOL 3350 17 G PO PACK
17.0000 g | PACK | Freq: Two times a day (BID) | ORAL | Status: DC
  Administered 2016-07-04 – 2016-07-07 (×6): 17 g via ORAL
  Filled 2016-07-04 (×7): qty 1

## 2016-07-04 MED ORDER — ONDANSETRON HCL 4 MG PO TABS: 4.0000 mg | ORAL_TABLET | Freq: Four times a day (QID) | ORAL | Status: DC | PRN

## 2016-07-04 MED ORDER — ONDANSETRON HCL 4 MG/2ML IJ SOLN: 4.0000 mg | Freq: Four times a day (QID) | INTRAMUSCULAR | Status: DC | PRN

## 2016-07-04 MED ORDER — SENNOSIDES-DOCUSATE SODIUM 8.6-50 MG PO TABS
2.0000 | ORAL_TABLET | Freq: Two times a day (BID) | ORAL | Status: DC
  Administered 2016-07-04 – 2016-07-07 (×7): 2 via ORAL
  Filled 2016-07-04 (×7): qty 2

## 2016-07-04 MED ORDER — METOPROLOL SUCCINATE ER 50 MG PO TB24
50.0000 mg | ORAL_TABLET | Freq: Every day | ORAL | Status: DC
  Administered 2016-07-05 – 2016-07-20 (×16): 50 mg via ORAL
  Filled 2016-07-04 (×16): qty 1

## 2016-07-04 MED ORDER — IRBESARTAN 300 MG PO TABS
300.0000 mg | ORAL_TABLET | Freq: Every day | ORAL | Status: DC
  Administered 2016-07-05 – 2016-07-20 (×16): 300 mg via ORAL
  Filled 2016-07-04 (×16): qty 1

## 2016-07-04 MED ORDER — OXYCODONE HCL 10 MG PO TABS: 10.0000 mg | ORAL_TABLET | ORAL | Status: DC | PRN

## 2016-07-04 MED ORDER — POLYETHYLENE GLYCOL 3350 17 G PO PACK: 17.0000 g | PACK | Freq: Two times a day (BID) | ORAL | Status: AC

## 2016-07-04 MED ORDER — MAGNESIUM CITRATE PO SOLN
1.0000 | Freq: Once | ORAL | Status: AC
  Administered 2016-07-04: 1 via ORAL
  Filled 2016-07-04: qty 296

## 2016-07-04 MED ORDER — SENNOSIDES-DOCUSATE SODIUM 8.6-50 MG PO TABS: 1.0000 | ORAL_TABLET | Freq: Every day | ORAL | Status: DC

## 2016-07-04 MED ORDER — HYDROCHLOROTHIAZIDE 12.5 MG PO CAPS
12.5000 mg | ORAL_CAPSULE | Freq: Every day | ORAL | Status: DC
  Administered 2016-07-05 – 2016-07-19 (×15): 12.5 mg via ORAL
  Filled 2016-07-04 (×15): qty 1

## 2016-07-04 MED ORDER — METHOCARBAMOL 500 MG PO TABS
500.0000 mg | ORAL_TABLET | Freq: Four times a day (QID) | ORAL | Status: DC | PRN
Start: 2016-07-04 — End: 2016-07-20

## 2016-07-04 MED ORDER — HYDROCHLOROTHIAZIDE 12.5 MG PO CAPS
12.5000 mg | ORAL_CAPSULE | Freq: Every day | ORAL | Status: DC
Start: 2016-07-05 — End: 2016-07-20

## 2016-07-04 MED ORDER — CLOPIDOGREL BISULFATE 75 MG PO TABS
75.0000 mg | ORAL_TABLET | Freq: Once | ORAL | Status: AC
Start: 2016-07-04 — End: 2016-07-04
  Administered 2016-07-04: 75 mg via ORAL
  Filled 2016-07-04: qty 1

## 2016-07-04 MED ORDER — OXYCODONE HCL 5 MG PO TABS
10.0000 mg | ORAL_TABLET | ORAL | Status: DC | PRN
  Administered 2016-07-04 – 2016-07-20 (×37): 10 mg via ORAL
  Filled 2016-07-04 (×39): qty 2

## 2016-07-04 NOTE — Discharge Summary (Signed)
Physician Discharge Summary  Rodney Crane OXB:[id number] DOB: Jan 15, 1951 DOA: 06/25/2016  PCP: Marton Redwood, MD  Admit date: 06/25/2016 Discharge date: 07/04/2016  Admitted From: Home Disposition: CIR  Recommendations for Outpatient Follow-up:  1. Orthopedic surgery follow-up for repeat surgery 2. Continue to treat constipation 3. Restarting Plavix. Will need to be held at least 5 days prior to next surgery   Discharge Condition: Guarded CODE STATUS: Full code Diet recommendation: Diet soft   Brief/Interim Summary:  Admission HPI written by Derrill Kay, MD   Chief Complaint:  Back pain, leg pain, leg swelling, weakness in his arms and legs, falling a lot, instructed by dr brooks to come to ED so he can have back surgery on monday  HPI: Rodney Crane is a 67 y.o. male with medical history significant of CVA, DM, HTN, OSA on cpap comes in after a multitude of testing over the last couple of weeks for weakness in his legs and arms.  Pt reports he has seen his orthopedic surgeon dr Rolena Infante who has sent him to see neuro for EMG studies of both arms and legs which he reports were normal.  He also was evaluated by vascular surgery in the last couple of days for a cold left foot which also revealed normal vascular studies and ultrasound to r/o DVT.  Pt has swelling in his hands and legs that has been present for weeks.  He denies any sob.   No chest pain.  No fever or cough.  He denies any heart history.  He says dr brooks left a message on his answering machine yesterday telling him to come to the ED for surgery to be set up on Monday by dr brooks on his spine (recent imaging shows cervical stenosis on c3,4 do not have imaging of the rest of his spine available).  He denies urinary incontinence.  He reports numbness in only his hands and feet bilaterally.  ED called ortho on call dr Marvis Moeller who advised starting steroids and they would evaluate patient.    Hospital course:  Cervical stenosis  spinal canal Patient is status post decompression C2-C4 and fusion C2-C5 on 06/26/2016. Physical therapy evaluated and recommended CIR.  Essential hypertension Uncontrolled initially. Regimen includes losartan, metoprolol and hydrochlorothiazide. Continue at discharge  Obstructive sleep apnea CPAP at bedtime  Diabetes mellitus Hemoglobin A1C of 6. Continue metformin.  Constipation Passing gas. No abdominal pain. Abdominal x-ray significant for significant amount of stool. No obstruction. Continue at discharge.   Chronic diastolic heart failure Euvolemic. EF of 60-65%. Grade 1 diastolic dysfunction.   Discharge Diagnoses:  Principal Problem:   Cervical stenosis of spinal canal Active Problems:   Essential hypertension   Weakness of extremity   OSA (obstructive sleep apnea)   Hypertension   Diabetes ([place])   Spinal stenosis in cervical region   Acute hypoxemic respiratory failure (HCC)   Myelopathy (HCC)   OSA on CPAP   Surgery, elective   History of CVA (cerebrovascular accident)   Benign essential HTN   History of cervical spinal surgery   History of lumbar surgery   Tobacco abuse   Diastolic dysfunction   Acute blood loss anemia   Post-operative pain   Tachycardia   Leukocytosis   Spinal stenosis of lumbar region   Central cord syndrome (HCC)   Abdominal distention   Abdominal pain   S/P cervical spinal fusion   Type 2 diabetes mellitus with peripheral neuropathy (HCC)   Hyperlipidemia   Constipation due to pain medication  Discharge Instructions  Discharge Instructions    Incentive spirometry RT    Complete by:  As directed      Allergies as of 07/04/2016   No Known Allergies     Medication List    STOP taking these medications   cyclobenzaprine 10 MG tablet Commonly known as:  FLEXERIL   naproxen 500 MG tablet Commonly known as:  NAPROSYN   oxyCODONE-acetaminophen 5-325 MG tablet Commonly known as:  PERCOCET/ROXICET     TAKE these  medications   atorvastatin 40 MG tablet Commonly known as:  LIPITOR Take 20 mg by mouth at bedtime.   clopidogrel 75 MG tablet Commonly known as:  PLAVIX Take 75 mg by mouth daily.   hydrochlorothiazide 12.5 MG capsule Commonly known as:  MICROZIDE Take 1 capsule (12.5 mg total) by mouth daily. Start taking on:  07/05/2016   metFORMIN 1000 MG tablet Commonly known as:  GLUCOPHAGE Take 1,000 mg by mouth 2 (two) times daily with a meal.   methocarbamol 500 MG tablet Commonly known as:  ROBAXIN Take 1 tablet (500 mg total) by mouth every 6 (six) hours as needed for muscle spasms.   Oxycodone HCl 10 MG Tabs Take 1 tablet (10 mg total) by mouth every 4 (four) hours as needed for severe pain.   pantoprazole 40 MG tablet Commonly known as:  PROTONIX Take 1 tablet (40 mg total) by mouth daily at 6 PM.   polyethylene glycol packet Commonly known as:  MIRALAX / GLYCOLAX Take 17 g by mouth 2 (two) times daily.   senna-docusate 8.6-50 MG tablet Commonly known as:  Senokot-S Take 1 tablet by mouth at bedtime.   TOPROL XL 50 MG 24 hr tablet Generic drug:  metoprolol succinate Take 50 mg by mouth daily.   valsartan 320 MG tablet Commonly known as:  DIOVAN Take 160 mg by mouth daily.       No Known Allergies  Consultations:  Orthopedic surgery  Inpatient rehab   Procedures/Studies: Dg Chest 2 View  Result Date: 06/25/2016 CLINICAL DATA:  Swelling.  Bilateral arm and hand numbness. EXAM: CHEST  2 VIEW COMPARISON:  Chest CT 01/21/2011 FINDINGS: Mild cardiomegaly. Normal mediastinal contours. Coarse interstitial markings likely secondary to emphysema, when correlated with prior CT. No evidence of pulmonary edema. No pleural effusion. No focal airspace disease or pneumothorax. There is degenerative change in the thoracic spine. Hardware in the cervical spine is partially included. IMPRESSION: Cardiomegaly without failure. Emphysema. Electronically Signed   By: Jeb Levering  M.D.   On: 06/25/2016 21:31   Dg Cervical Spine 2-3 Views  Result Date: 06/27/2016 CLINICAL DATA:  C2-7 posterior cervical fusion. EXAM: CERVICAL SPINE - 2-3 VIEW; DG C-ARM 61-120 MIN COMPARISON:  MRI 06/26/2016. FLUOROSCOPY TIME: C-arm fluoroscopic images were obtained intraoperatively and submitted for post operative interpretation. Please see the performing provider's procedural report for the fluoroscopy time utilized. FINDINGS: Four spot fluoroscopic images are submitted from the operating room. These demonstrate a pre-existing anterior plate and screws extending inferiorly from C4. The inferior extent of the hardware is not visualized in the lateral projection, although based on the frontal examination and previous MRI and, extends from C4 through C7. There are new posterior pedicle screws and rods extending from C2 through C6. The alignment is normal. No perioperative complications are identified. IMPRESSION: Intraoperative views during revision of cervical fusion with posterior components extending from C2 through C6. No demonstrated complication. Electronically Signed   By: Richardean Sale M.D.   On: 06/27/2016  15:12   Ct Cervical Spine Wo Contrast  Result Date: 06/26/2016 CLINICAL DATA:  Myelopathy. Weakness in the arms and legs. Pre-surgical planning for cervical decompression. EXAM: CT CERVICAL SPINE WITHOUT CONTRAST TECHNIQUE: Multidetector CT imaging of the cervical spine was performed without intravenous contrast. Multiplanar CT image reconstructions were also generated. COMPARISON:  Brain MRI 06/25/2016 FINDINGS: Alignment: Trace retrolisthesis of C2 on C3. Skull base and vertebrae: Prior C4-C7 ACDF with solid osseous fusion at each level. Anterior plate and screws in place without surrounding lucency. No evidence of acute fracture or destructive osseous process. Soft tissues and spinal canal: No prevertebral fluid or swelling. No visible canal hematoma. Upper chest: Minimal pleural-parenchymal  scarring in the lung apices. Other: None. Disc levels: Mild ligamentous thickening and calcification posterior to the dens without evidence of significant canal stenosis at this level. The cervical spinal canal is small in caliber diffusely on a congenital basis. C2-3: Disc bulging/uncovering and uncovertebral spurring result in suspected moderate spinal stenosis and moderate left neural foraminal stenosis. C3-4: Central disc protrusion with suspected severe spinal stenosis and cord flattening as seen on the recent brain MRI. Mild right and moderate left facet arthrosis result in moderate left neural foraminal stenosis. C4-5: Prior ACDF. 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 No significant osseous neural foraminal stenosis. C5-6: Prior ACDF. Posterior longitudinal ligament ossification with potential mild-to-moderate residual congenital and acquired spinal stenosis. No significant osseous neural foraminal stenosis. C6-7: Prior ACDF. Diffuse osteophytic ridging with mild osseous neural foraminal stenosis bilaterally. C7-T1: Severe disc space narrowing. Diffuse spurring results in moderate to severe bilateral neural foraminal stenosis. Spinal canal not well evaluated. T1-2: Mild disc space narrowing. Mild spurring and facet arthrosis result in moderate bilateral neural foraminal stenosis. Spinal canal not well evaluated. T2-3: Mild disc space narrowing. Evidence of prior right laminotomy. Moderately bulky ligamentum flavum ossification with at least mild spinal stenosis and moderate bilateral neural foraminal stenosis. IMPRESSION: 1. Solid C4-C7 ACDF. 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 2. Suspected central disc protrusion at C3-4 with likely severe spinal stenosis and cord flattening as mentioned on recent brain MRI. Moderate left foraminal stenosis. 3.  Potential moderate spinal stenosis at C2-3. 4. Moderate to severe neural foraminal stenosis at C7-T1 and in the upper thoracic spine. Electronically Signed   By: Logan Bores M.D.   On: 06/26/2016 12:10   Mr Brain Wo Contrast  Result Date: 06/25/2016 CLINICAL DATA:  Ongoing back pain for 1 month after fall.  Weakness. EXAM: MRI HEAD WITHOUT CONTRAST TECHNIQUE: Multiplanar, multiecho pulse sequences of the brain and surrounding structures were obtained without intravenous contrast. COMPARISON:  None. FINDINGS: Brain: No acute infarction, hemorrhage, hydrocephalus, extra-axial collection or mass lesion. Moderate T2 hyperintensity within irregular shape in the pons, likely chronic microvascular disease in this patient with vascular risk factors. No restricted diffusion or swelling to suggest pontine demyelination or mass. Microvascular change in the cerebral white matter is mild. Chronic lacune in the left thalamus. Vascular: Preserved major flow voids Skull and upper cervical spine: Partly seen advanced degenerative change in the upper cervical spine with probable cord flattening at C3-4. Sinuses/Orbits: No acute finding IMPRESSION: 1. No acute intracranial finding. 2. Prominent upper cervical spine degeneration with cord flattening at C3-4. Patient reportedly referred to ER by orthopedics, is there available outside cervical spine MRI? 3. Moderate signal abnormality in the pons, likely chronic microvascular ischemia in this patient with multiple vascular  risk factors. Remote lacunar infarct in the left thalamus. Electronically Signed   By: Monte Fantasia M.D.   On: 06/25/2016 17:54   Mr Cervical Spine W Wo Contrast  Result Date: 06/26/2016 CLINICAL DATA:  Myelopathy.  Planning for decompression tomorrow. EXAM: MRI CERVICAL SPINE WITHOUT AND WITH CONTRAST TECHNIQUE: Multiplanar and multiecho pulse sequences of the cervical spine, to include the craniocervical junction and cervicothoracic junction, were obtained  without and with intravenous contrast. CONTRAST:  29mL MULTIHANCE GADOBENATE DIMEGLUMINE 529 MG/ML IV SOLN COMPARISON:  Cervical spine CT from earlier today FINDINGS: Alignment: Straightening of the cervical spine. C2-3 retrolisthesis. Vertebrae: No fracture, evidence of discitis, or bone lesion. C4-C6 ACDF with solid bony fusion Cord: Deformity from compression at C2-3 and C3-4. There is an 9mm intrathecal mass in the left canal the level of C2. The epicenter is likely extra medullary, although there could be involvement of the superficial cord. The mass is oriented along the C3 nerve, favor schwannoma. Posterior Fossa, vertebral arteries, paraspinal tissues: Negative. Disc levels: C2-3: Disc narrowing with endplate ridging greater to the left. Mild bilateral facet hypertrophy. Ligamentum flavum thickening. Advanced spinal stenosis with cord flattening. Left more than right foraminal impingement. Mass on the left as described above. C3-4: Advanced facet arthropathy on the left with spurring. Disc narrowing and bulging with endplate and uncovertebral ridging. Advanced spinal stenosis with cord compression and bilateral central cord T2 hyperintensity. biforaminal impingement. C4-5: ACDF with solid bony fusion. Congenital spinal canal narrowing, ligamentum flavum thickening, and ossified posterior longitudinal ligament causes continued moderate spinal stenosis. C5-6: ACDF with solid bony fusion. Congenital spinal canal narrowing, ligamentum flavum thickening, and ossified posterior longitudinal ligament causes continued spinal stenosis. Eccentric right-sided spurring causes mild deformity of the cord. Central cord signal abnormality on the right has the appearance of myelomalacia. C6-7: ACDF with solid bony fusion. Sufficiently patent canal and foramina. C7-T1:Facet arthropathy with spurring. Degenerative disc narrowing with endplate and uncovertebral ridging. biforaminal impingement. Mild spinal stenosis. T1-2 and  T2-3 biforaminal narrowing from facet spurring. IMPRESSION: 1. Known advanced spinal stenosis with cord compression at C2-3 and C3-4, combination of degenerative factors and congenitally narrow canal. Cord signal abnormality at C3-4. Biforaminal impingement at both levels, worse on the left. 2. 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 3. C4-C6 ACDF with solid bony fusion. Residual canal stenosis from congenital factors and posterior longitudinal ligament ossification. 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 4. C7-T1 biforaminal impingement. Electronically Signed   By: Monte Fantasia M.D.   On: 06/26/2016 19:15   Dg Chest Port 1 View  Result Date: 06/29/2016 CLINICAL DATA:  Respiratory failure EXAM: PORTABLE CHEST 1 VIEW COMPARISON:  06/27/2016 FINDINGS: Cardiac shadow is not enlarged and accentuated by the portable technique. Postsurgical changes in the cervical spine are again seen. The lungs are well aerated bilaterally with mild bibasilar atelectatic changes. No focal infiltrate or sizable effusion is seen. No bony abnormality is noted. IMPRESSION: No change from the prior exam. Electronically Signed   By: Inez Catalina M.D.   On: 06/29/2016 07:36   Dg Chest Port 1 View  Result Date: 06/27/2016 CLINICAL DATA:  Endotracheal tube placement EXAM: PORTABLE CHEST 1 VIEW COMPARISON:  06/25/2016 FINDINGS: At 1703 hours. Endotracheal tube tip is 4.8 cm above the base of the carina. Lung volumes are low. No pulmonary edema or focal airspace consolidation. Probable atelectasis left lung base. Cardiopericardial silhouette is at upper limits of normal for size. The visualized bony structures  of the thorax are intact. Cervical fusion hardware evident with probable surgical drains overlying the lower neck. IMPRESSION: Endotracheal tube tip 4.8 cm above the base of the carina. Low volumes with left base atelectasis. Electronically Signed   By: Misty Stanley M.D.   On: 06/27/2016 17:13    Dg Abd Portable 1v  Result Date: 07/04/2016 CLINICAL DATA:  Abdominal pain and abdominal distention. Constipation. EXAM: PORTABLE ABDOMEN - 1 VIEW COMPARISON:  None. FINDINGS: There is extensive stool throughout the entire colon. No appreciable fecal impaction. No dilated loops of large or small bowel. No acute bone abnormality.  No visible free air or free fluid. IMPRESSION: Extensive stool throughout the colon.  A Electronically Signed   By: Lorriane Shire M.D.   On: 07/04/2016 10:48   Dg Swallowing Func-speech Pathology  Result Date: 07/01/2016 Objective Swallowing Evaluation: Type of Study: MBS-Modified Barium Swallow Study Patient Details Name: Rodney Crane MRN: [id number] Date of Birth: 1950/11/27 Today's Date: 07/01/2016 Time: SLP Start Time (ACUTE ONLY): 0930-SLP Stop Time (ACUTE ONLY): 1000 SLP Time Calculation (min) (ACUTE ONLY): 30 min Past Medical History: Past Medical History: Diagnosis Date . Arthritis  . Colon polyps  . CVA (cerebral infarction) 2010 . Diabetes mellitus  . Dizziness  . ED (erectile dysfunction)  . Foot drop  . H/O Bell's palsy  . Hyperlipidemia  . Hypertension  . Obesity  . OSA on CPAP  . Spinal stenosis  . Stroke ([place])  . Unsteady gait  Past Surgical History: Past Surgical History: Procedure Laterality Date . CARPAL TUNNEL RELEASE    bilateral . CERVICAL DISCECTOMY  2004 . LUMBAR DISC SURGERY    x6 . POSTERIOR CERVICAL FUSION/FORAMINOTOMY N/A 06/27/2016  Procedure: POSTERIOR CERVICAL FUSION C2-C5; C2-C4 Decompression;  Surgeon: Melina Schools, MD;  Location: [facility];  Service: Orthopedics;  Laterality: N/A; HPI: Patient is a 66 y/o male CVA, HTN, HLD, DM, smoker, OSA on CPAP presents with central cord syndrome s/p C2-5 posterior cervical fusion and C2-4 decompression 3/4. With compromised airway after surgery, extubated 3/6. Subjective: pleasant, good historian Assessment / Plan / Recommendation CHL IP CLINICAL IMPRESSIONS 07/01/2016 Clinical Impression Pt presents with acute  dysphagia s/p cervical surgery, leading to edema of posterior pharyngeal wall.  Edema inhibits adequate pharyngeal stripping of bolus and full epiglottic closure over larynx.  Purees tend to remain in vallecular and pyriform spaces; thin liquids help clear residue.  Pt is able to clear residue with multiple sub-swallows per bolus.  He protected airway well when drinking thin liquids - no penetration nor aspiration.  Recommend continue Dysphagia 1, thin liquids for now.  SLP will follow for safety/diet progression.  SLP Visit Diagnosis Dysphagia, pharyngeal phase (R13.13) Attention and concentration deficit following -- Frontal lobe and executive function deficit following -- Impact on safety and function Mild aspiration risk   CHL IP TREATMENT RECOMMENDATION 07/01/2016 Treatment Recommendations Therapy as outlined in treatment plan below   Prognosis 07/01/2016 Prognosis for Safe Diet Advancement Good Barriers to Reach Goals -- Barriers/Prognosis Comment -- CHL IP DIET RECOMMENDATION 07/01/2016 SLP Diet Recommendations Dysphagia 1 (Puree) solids;Thin liquid Liquid Administration via Cup;Straw Medication Administration Crushed with puree Compensations Slow rate;Small sips/bites Postural Changes Seated upright at 90 degrees   CHL IP OTHER RECOMMENDATIONS 07/01/2016 Recommended Consults -- Oral Care Recommendations Oral care BID Other Recommendations --   CHL IP FOLLOW UP RECOMMENDATIONS 07/01/2016 Follow up Recommendations Inpatient Rehab   CHL IP FREQUENCY AND DURATION 07/01/2016 Speech Therapy Frequency (ACUTE ONLY) min 2x/week Treatment Duration 1 week  CHL IP ORAL PHASE 07/01/2016 Oral Phase WFL Oral - Pudding Teaspoon -- Oral - Pudding Cup -- Oral - Honey Teaspoon -- Oral - Honey Cup -- Oral - Nectar Teaspoon -- Oral - Nectar Cup -- Oral - Nectar Straw -- Oral - Thin Teaspoon -- Oral - Thin Cup -- Oral - Thin Straw -- Oral - Puree -- Oral - Mech Soft -- Oral - Regular -- Oral - Multi-Consistency -- Oral - Pill -- Oral Phase  - Comment --  CHL IP PHARYNGEAL PHASE 07/01/2016 Pharyngeal Phase Impaired Pharyngeal- Pudding Teaspoon -- Pharyngeal -- Pharyngeal- Pudding Cup -- Pharyngeal -- Pharyngeal- Honey Teaspoon -- Pharyngeal -- Pharyngeal- Honey Cup -- Pharyngeal -- Pharyngeal- Nectar Teaspoon -- Pharyngeal -- Pharyngeal- Nectar Cup -- Pharyngeal -- Pharyngeal- Nectar Straw -- Pharyngeal -- Pharyngeal- Thin Teaspoon -- Pharyngeal -- Pharyngeal- Thin Cup -- Pharyngeal -- Pharyngeal- Thin Straw Reduced pharyngeal peristalsis;Reduced epiglottic inversion;Pharyngeal residue - valleculae;Pharyngeal residue - pyriform Pharyngeal -- Pharyngeal- Puree Reduced pharyngeal peristalsis;Reduced epiglottic inversion;Pharyngeal residue - valleculae;Pharyngeal residue - pyriform Pharyngeal -- Pharyngeal- Mechanical Soft -- Pharyngeal -- Pharyngeal- Regular -- Pharyngeal -- Pharyngeal- Multi-consistency -- Pharyngeal -- Pharyngeal- Pill -- Pharyngeal -- Pharyngeal Comment --  No flowsheet data found. CHL IP GO 01/13/2015 Functional Assessment Tool Used noms Functional Limitations Motor speech Swallow Current Status [phone]) (None) Swallow Goal Status (F6812) (None) Swallow Discharge Status [phone]) (None) Motor Speech Current Status (Y1749) [place] Motor Speech Goal Status (S4967) [place] Motor Speech Goal Status (R9163) [place] Spoken Language Comprehension Current Status (W4665) (None) Spoken Language Comprehension Goal Status (L9357) (None) Spoken Language Comprehension Discharge Status [phone]) (None) Spoken Language Expression Current Status [phone]) (None) Spoken Language Expression Goal Status (S9233) (None) Spoken Language Expression Discharge Status [phone]) (None) Attention Current Status (U6333) (None) Attention Goal Status (L4562) (None) Attention Discharge Status [phone]) (None) Memory Current Status (H7342) (None) Memory Goal Status (A7681) (None) Memory Discharge Status (L5726) (None) Voice Current Status (O0355) (None) Voice Goal Status (H7416) (None) Voice  Discharge Status [phone]) (None) Other Speech-Language Pathology Functional Limitation Current Status (M4680) (None) Other Speech-Language Pathology Functional Limitation Goal Status (H2122) (None) Other Speech-Language Pathology Functional Limitation Discharge Status [phone]) (None) Juan Quam Laurice 07/01/2016, 10:45 AM              Dg C-arm 61-120 Min  Result Date: 06/27/2016 CLINICAL DATA:  C2-7 posterior cervical fusion. EXAM: CERVICAL SPINE - 2-3 VIEW; DG C-ARM 61-120 MIN COMPARISON:  MRI 06/26/2016. FLUOROSCOPY TIME: C-arm fluoroscopic images were obtained intraoperatively and submitted for post operative interpretation. Please see the performing provider's procedural report for the fluoroscopy time utilized. FINDINGS: Four spot fluoroscopic images are submitted from the operating room. These demonstrate a pre-existing anterior plate and screws extending inferiorly from C4. The inferior extent of the hardware is not visualized in the lateral projection, although based on the frontal examination and previous MRI and, extends from C4 through C7. There are new posterior pedicle screws and rods extending from C2 through C6. The alignment is normal. No perioperative complications are identified. IMPRESSION: Intraoperative views during revision of cervical fusion with posterior components extending from C2 through C6. No demonstrated complication. Electronically Signed   By: Richardean Sale M.D.   On: 06/27/2016 15:12    Echocardiogram (06/26/2016)  Study Conclusions  - Left ventricle: The cavity size was normal. Wall thickness was   increased increased in a pattern of mild to moderate LVH.   Systolic function was normal. The estimated ejection fraction was   in the range of 60% to  65%. Wall motion was normal; there were no   regional wall motion abnormalities. Doppler parameters are   consistent with abnormal left ventricular relaxation (grade 1   diastolic dysfunction). - Aortic root: The aortic  root was mildly dilated. - Left atrium: The atrium was mildly dilated.   Subjective: Patient reports no chest pain or dyspnea. Some abdominal discomfort. No bowel movement.  Discharge Exam: Vitals:   07/04/16 0528 07/04/16 0921  BP: (!) 141/93 129/69  Pulse: 95 94  Resp: 20 18  Temp: 98.3 F (36.8 C) 98.5 F (36.9 C)   Vitals:   07/03/16 2138 07/04/16 0025 07/04/16 0528 07/04/16 0921  BP:  114/74 (!) 141/93 129/69  Pulse: 74 89 95 94  Resp: 18 20 20 18   Temp:  98.9 F (37.2 C) 98.3 F (36.8 C) 98.5 F (36.9 C)  TempSrc:  Oral Oral Oral  SpO2: 100% 98% 99% 98%  Weight:      Height:        General exam: Appears calm and comfortable Respiratory system: Clear to auscultation. Respiratory effort normal. Cardiovascular system: S1 & S2 heard, Normal rate with regular rhythm Gastrointestinal system: Abdomen is distended, soft and nontender. Normal bowel sounds heard. Central nervous system: Alert and oriented. Right leg 3/5 strength. Diminished sensation. Extremities: No edema. No calf tenderness Skin: No cyanosis. No rashes Psychiatry: Judgement and insight appear normal. Mood & affect appropriate.   The results of significant diagnostics from this hospitalization (including imaging, microbiology, ancillary and laboratory) are listed below for reference.     Microbiology: Recent Results (from the past 240 hour(s))  MRSA PCR Screening     Status: None   Collection Time: 06/27/16  4:40 PM  Result Value Ref Range Status   MRSA by PCR NEGATIVE NEGATIVE Final    Comment:        The GeneXpert MRSA Assay (FDA approved for NASAL specimens only), is one component of a comprehensive MRSA colonization surveillance program. It is not intended to diagnose MRSA infection nor to guide or monitor treatment for MRSA infections.      Labs: BNP (last 3 results)  Recent Labs  06/25/16 1635  BNP 7.6   Basic Metabolic Panel:  Recent Labs Lab 06/27/16 1314 06/27/16 1409  06/28/16 0447 06/29/16 0436  NA 136 138 138 135  K 4.7 4.3 4.4 3.9  CL  --   --  107 105  CO2  --   --  23 22  GLUCOSE  --  179* 131* 150*  BUN  --   --  16 14  CREATININE  --   --  0.97 0.90  CALCIUM  --   --  8.5* 8.5*   Liver Function Tests: No results for input(s): AST, ALT, ALKPHOS, BILITOT, PROT, ALBUMIN in the last 168 hours. No results for input(s): LIPASE, AMYLASE in the last 168 hours. No results for input(s): AMMONIA in the last 168 hours. CBC:  Recent Labs Lab 06/27/16 1314 06/27/16 1409 06/28/16 0447 06/29/16 0436  WBC  --   --  10.1 12.5*  HGB 6.5* 8.8* 10.5* 11.4*  HCT 19.0* 26.0* 30.6* 33.9*  MCV  --   --  86.9 88.3  PLT  --   --  201 209   Cardiac Enzymes:  Recent Labs Lab 06/28/16 0826 06/28/16 1412 06/28/16 2014  TROPONINI <0.03 <0.03 <0.03   BNP: Invalid input(s): POCBNP CBG:  Recent Labs Lab 07/02/16 2228 07/03/16 0622 07/03/16 1105 07/03/16 1555 07/03/16 2102  GLUCAP  156* 120* 173* 207* 121*   D-Dimer No results for input(s): DDIMER in the last 72 hours. Hgb A1c No results for input(s): HGBA1C in the last 72 hours. Lipid Profile  Recent Labs  07/03/16 1748  TRIG 102   Thyroid function studies No results for input(s): TSH, T4TOTAL, T3FREE, THYROIDAB in the last 72 hours.  Invalid input(s): FREET3 Anemia work up No results for input(s): VITAMINB12, FOLATE, FERRITIN, TIBC, IRON, RETICCTPCT in the last 72 hours. Urinalysis No results found for: COLORURINE, APPEARANCEUR, [place], [place], [place], [place], [place], [place], PROTEINUR, UROBILINOGEN, NITRITE, LEUKOCYTESUR Sepsis Labs Invalid input(s): PROCALCITONIN,  WBC,  LACTICIDVEN Microbiology Recent Results (from the past 240 hour(s))  MRSA PCR Screening     Status: None   Collection Time: 06/27/16  4:40 PM  Result Value Ref Range Status   MRSA by PCR NEGATIVE NEGATIVE Final    Comment:        The GeneXpert MRSA Assay (FDA approved for NASAL specimens only), is  one component of a comprehensive MRSA colonization surveillance program. It is not intended to diagnose MRSA infection nor to guide or monitor treatment for MRSA infections.      Time coordinating discharge: Over 30 minutes  SIGNED:   Cordelia Poche, MD [facility] 07/04/2016, 12:45 PM Pager [phone]  If 7PM-7AM, please contact night-coverage [email or web address] Password [email or web address]

## 2016-07-04 NOTE — Progress Notes (Signed)
I met with pt and his wife at bedside with Dr. Lonny Prude. Discussed with Dr. Hulda Humphrey that Dr, Rolena Infante recommends admit to CIR with Rolena Infante to follow up with pt in CIR for possible return later for lumbar surgery prior to d/c home and likely return to CIR after second surgery to complete his rehab. All in agreement to plan. Discussed with RN and Dr. Lonny Prude that pt has not had BM since admit. Pt reports he had not had a BM for 1-2 weeks prior to admit. XRAY results given to Dr. Lonny Prude. We will plan admit to CIR today. RN CM made aware. [phone]

## 2016-07-04 NOTE — Progress Notes (Signed)
Rodney Lorie Phenix, MD Physician Signed Physical Medicine and Rehabilitation  Consult Note Date of Service: 06/29/2016 10:51 AM  Related encounter: ED to Hosp-Admission (Current) from 06/25/2016 in [place] All Collapse All   [] Hide copied text [] Hover for attribution information      Physical Medicine and Rehabilitation Consult Reason for Consult: Central cord syndrome Referring Physician: Dr.Nettey   HPI: Rodney Crane is a 66 y.o. right handed male with history of diabetes mellitus, CVA 2010, hypertension, cervical discectomy 2004 and lumbar disc surgery and tobacco abuse. Per chart review and patient, patient lives with spouse. One level home. Patient reports he was independent until approximately one month ago and then with multiple falls and now using a rolling walker for short distance ambulation. Present 06/26/2016 with progressive back and leg pain as well as multiple falls followed as an outpatient by Dr. Rolena Infante. MRI reviewed, showing spinal stenosis C3-4 level as well as C2-3. Preoperative clearance echocardiogram with ejection fraction 62% grade 1 diastolic dysfunction. Underwent posterior cervical fusion C2-C5, C2-C4 decompression 06/26/2016 per Dr. Rolena Infante. Patient did require short-term intubation until 06/28/2016. Acute blood loss anemia and was transfused. Hospital course pain management. Hard cervical collar at all times. Patient with plans to ?undergo lumbar surgery as well.  Physical therapy evaluation completed 06/28/2016 with recommendations of physical medicine rehabilitation consult.   Review of Systems  Constitutional: Negative for chills and fever.  HENT: Negative for hearing loss.   Eyes: Negative for blurred vision and double vision.  Respiratory: Negative for cough and shortness of breath.   Cardiovascular: Positive for leg swelling. Negative for chest pain and palpitations.  Gastrointestinal: Positive for  constipation. Negative for nausea.  Genitourinary: Positive for urgency. Negative for dysuria and hematuria.  Musculoskeletal: Positive for myalgias and neck pain.  Skin: Negative for rash.  Neurological: Positive for dizziness, sensory change, focal weakness and weakness. Negative for seizures.       Foot drop  All other systems reviewed and are negative.      Past Medical History:  Diagnosis Date  . Arthritis   . Colon polyps   . CVA (cerebral infarction) 2010  . Diabetes mellitus   . Dizziness   . ED (erectile dysfunction)   . Foot drop   . H/O Bell's palsy   . Hyperlipidemia   . Hypertension   . Obesity   . OSA on CPAP   . Spinal stenosis   . Stroke ([place])   . Unsteady gait         Past Surgical History:  Procedure Laterality Date  . CARPAL TUNNEL RELEASE     bilateral  . CERVICAL DISCECTOMY  2004  . LUMBAR DISC SURGERY     x6        Family History  Problem Relation Age of Onset  . Multiple sclerosis Sister   . Dementia Mother   . Cancer Father   . Sleep apnea Brother   . Hypertension Brother   . Colon cancer Neg Hx   . Stomach cancer Neg Hx    Social History:  reports that he has been smoking Cigarettes.  He has never used smokeless tobacco. He reports that he does not drink alcohol or use drugs. Allergies: No Known Allergies          Facility-Administered Medications Prior to Admission  Medication Dose Route Frequency Provider Last Rate Last Dose  . 0.9 %  sodium chloride infusion  500 mL  Intravenous Continuous Irene Shipper, MD             Medications Prior to Admission  Medication Sig Dispense Refill  . atorvastatin (LIPITOR) 40 MG tablet Take 20 mg by mouth at bedtime.   7  . clopidogrel (PLAVIX) 75 MG tablet Take 75 mg by mouth daily.  6  . cyclobenzaprine (FLEXERIL) 10 MG tablet TAKE 1 TABLET BY MOUTH EVERY 8 HOURS AS NEEDED FOR MUSCLE SPASMS  1  . metFORMIN (GLUCOPHAGE) 1000 MG tablet Take 1,000 mg by mouth 2  (two) times daily with a meal.    . metoprolol (TOPROL XL) 50 MG 24 hr tablet Take 50 mg by mouth daily.      . naproxen (NAPROSYN) 500 MG tablet Take 500 mg by mouth 2 (two) times daily. for pain  1  . oxyCODONE-acetaminophen (PERCOCET/ROXICET) 5-325 MG per tablet TAKE 1 TABLET BY MOUTH 3 TIMES DIALY AS NEEDED  0  . valsartan (DIOVAN) 320 MG tablet Take 160 mg by mouth daily.   5    Home: Home Living Family/patient expects to be discharged to:: Private residence Living Arrangements: Spouse/significant other Available Help at Discharge: Family Type of Home: House Home Access: Stairs to enter [employer] of Steps: 1/2 step (porch stoop) Home Layout: One level Bathroom Shower/Tub: Chiropodist: Handicapped height [place]: Environmental consultant - 2 wheels, Bedside commode, Shower seat  Functional History: Prior Function Level of Independence: Needs assistance Gait / Transfers Assistance Needed: Pt reports that one month ago he was independent and moving well. Since then, he has had multiple falls at home; amb short distances with RW.  ADL's / Homemaking Assistance Needed: Requires assist for ADLs/IADLs Comments: Retired from work. Uses CPAP.  Functional Status:  Mobility: Bed Mobility Overal bed mobility: Needs Assistance Bed Mobility: Rolling, Sidelying to Sit, Sit to Sidelying Rolling: Max assist, +2 for physical assistance, +2 for safety/equipment Sidelying to sit: HOB elevated, Max assist, +2 for physical assistance Sit to sidelying: Max assist, +2 for physical assistance General bed mobility comments: Cues for log roll technique. Assist to roll with cues for sequencing, manage LEs and to elevate trunk. Assist to bring LEs into bed and to lower trunk to return to supine. Transfers Overall transfer level: Needs assistance Equipment used: 2 person hand held assist Transfers: Sit to/from Stand General transfer comment: Attempted sit to stand x3 with  Max A of 2 with therapists supporting bil knees and providing cues for hip extension. Able to partially stand x1 and clear bottom x2 to help scoot bottom towards left in bed.  ADL: ADL Overall ADL's : Needs assistance/impaired Eating/Feeding: Maximal assistance, Bed level Grooming: Wash/dry hands, Wash/dry face, Minimal assistance, Bed level Upper Body Bathing: Total assistance, Bed level Lower Body Bathing: Total assistance, Bed level Upper Body Dressing : Total assistance, Bed level Lower Body Dressing: Total assistance, Bed level Toilet Transfer: Total assistance Toilet Transfer Details (indicate cue type and reason): uanble to attempt  Toileting- Clothing Manipulation and Hygiene: Total assistance, Bed level Functional mobility during ADLs: +2 for physical assistance, Maximal assistance  Cognition: Cognition Overall Cognitive Status: Within Functional Limits for tasks assessed Orientation Level: Oriented X4 Cognition Arousal/Alertness: Awake/alert Behavior During Therapy: WFL for tasks assessed/performed Overall Cognitive Status: Within Functional Limits for tasks assessed  Blood pressure (!) 168/87, pulse 90, temperature 97.9 F (36.6 C), temperature source Oral, resp. rate 17, height 6\' 1"  (1.854 m), weight 132.7 kg (292 lb 8.8 oz), SpO2 100 %. Physical Exam  Vitals reviewed. Constitutional: He is oriented to person, place, and time. He appears well-developed.  Obese  HENT:  Head: Normocephalic.  Eyes: Conjunctivae and EOM are normal.  Neck:  Cervical collar in place  Cardiovascular: Normal rate and regular rhythm.   Respiratory: Effort normal and breath sounds normal. No respiratory distress.  GI: Soft. Bowel sounds are normal. He exhibits no distension.  Musculoskeletal: He exhibits edema. He exhibits no tenderness.  Neurological: He is alert and oriented to person, place, and time.  Motor: RUE: Shoulder abduction 2/5, elbow flexion/extension 3+/5, wrist/hand  4/5 LUE: Shoulder abduction 4-/5, elbow flexion/extension 4/5, wrist/hand 4+/5 RLE: HF, KE 2-/5, ADF/PF 0/5 LLE: HF, KE 2+/5, ADF/PF 4/5 Sensation diminished to light touch in hands and feet DTRs symmetric  Skin: Skin is warm and dry.  Surgical site is dressed  Psychiatric: He has a normal mood and affect. His behavior is normal.    Lab Results Last 24 Hours       Results for orders placed or performed during the hospital encounter of 06/25/16 (from the past 24 hour(s))  Glucose, capillary     Status: None   Collection Time: 06/28/16 12:07 PM  Result Value Ref Range   Glucose-Capillary 92 65 - 99 mg/dL   Comment 1 Notify RN    Comment 2 Document in Chart   Troponin I (q 6hr x 3)     Status: None   Collection Time: 06/28/16  2:12 PM  Result Value Ref Range   Troponin I <0.03 <0.03 ng/mL  Glucose, capillary     Status: Abnormal   Collection Time: 06/28/16  3:31 PM  Result Value Ref Range   Glucose-Capillary 113 (H) 65 - 99 mg/dL   Comment 1 Notify RN    Comment 2 Document in Chart   Glucose, capillary     Status: Abnormal   Collection Time: 06/28/16  7:44 PM  Result Value Ref Range   Glucose-Capillary 121 (H) 65 - 99 mg/dL  Troponin I (q 6hr x 3)     Status: None   Collection Time: 06/28/16  8:14 PM  Result Value Ref Range   Troponin I <0.03 <0.03 ng/mL  Glucose, capillary     Status: Abnormal   Collection Time: 06/28/16 11:18 PM  Result Value Ref Range   Glucose-Capillary 160 (H) 65 - 99 mg/dL  Glucose, capillary     Status: Abnormal   Collection Time: 06/29/16  3:34 AM  Result Value Ref Range   Glucose-Capillary 140 (H) 65 - 99 mg/dL  CBC     Status: Abnormal   Collection Time: 06/29/16  4:36 AM  Result Value Ref Range   WBC 12.5 (H) 4.0 - 10.5 K/uL   RBC 3.84 (L) 4.22 - 5.81 MIL/uL   Hemoglobin 11.4 (L) 13.0 - 17.0 g/dL   HCT 33.9 (L) 39.0 - 52.0 %   MCV 88.3 78.0 - 100.0 fL   MCH 29.7 26.0 - 34.0 pg   MCHC 33.6 30.0 - 36.0 g/dL    RDW 14.0 11.5 - 15.5 %   Platelets 209 150 - 400 K/uL  Basic metabolic panel     Status: Abnormal   Collection Time: 06/29/16  4:36 AM  Result Value Ref Range   Sodium 135 135 - 145 mmol/L   Potassium 3.9 3.5 - 5.1 mmol/L   Chloride 105 101 - 111 mmol/L   CO2 22 22 - 32 mmol/L   Glucose, Bld 150 (H) 65 - 99 mg/dL   BUN  14 6 - 20 mg/dL   Creatinine, Ser 0.90 0.61 - 1.24 mg/dL   Calcium 8.5 (L) 8.9 - 10.3 mg/dL   GFR calc non Af Amer >60 >60 mL/min   GFR calc Af Amer >60 >60 mL/min   Anion gap 8 5 - 15  Glucose, capillary     Status: Abnormal   Collection Time: 06/29/16  8:24 AM  Result Value Ref Range   Glucose-Capillary 142 (H) 65 - 99 mg/dL   Comment 1 Notify RN    Comment 2 Document in Chart       Imaging Results (Last 48 hours)  Dg Cervical Spine 2-3 Views  Result Date: 06/27/2016 CLINICAL DATA:  C2-7 posterior cervical fusion. EXAM: CERVICAL SPINE - 2-3 VIEW; DG C-ARM 61-120 MIN COMPARISON:  MRI 06/26/2016. FLUOROSCOPY TIME: C-arm fluoroscopic images were obtained intraoperatively and submitted for post operative interpretation. Please see the performing provider's procedural report for the fluoroscopy time utilized. FINDINGS: Four spot fluoroscopic images are submitted from the operating room. These demonstrate a pre-existing anterior plate and screws extending inferiorly from C4. The inferior extent of the hardware is not visualized in the lateral projection, although based on the frontal examination and previous MRI and, extends from C4 through C7. There are new posterior pedicle screws and rods extending from C2 through C6. The alignment is normal. No perioperative complications are identified. IMPRESSION: Intraoperative views during revision of cervical fusion with posterior components extending from C2 through C6. No demonstrated complication. Electronically Signed   By: Richardean Sale M.D.   On: 06/27/2016 15:12   Dg Chest Port 1 View  Result  Date: 06/29/2016 CLINICAL DATA:  Respiratory failure EXAM: PORTABLE CHEST 1 VIEW COMPARISON:  06/27/2016 FINDINGS: Cardiac shadow is not enlarged and accentuated by the portable technique. Postsurgical changes in the cervical spine are again seen. The lungs are well aerated bilaterally with mild bibasilar atelectatic changes. No focal infiltrate or sizable effusion is seen. No bony abnormality is noted. IMPRESSION: No change from the prior exam. Electronically Signed   By: Inez Catalina M.D.   On: 06/29/2016 07:36   Dg Chest Port 1 View  Result Date: 06/27/2016 CLINICAL DATA:  Endotracheal tube placement EXAM: PORTABLE CHEST 1 VIEW COMPARISON:  06/25/2016 FINDINGS: At 1703 hours. Endotracheal tube tip is 4.8 cm above the base of the carina. Lung volumes are low. No pulmonary edema or focal airspace consolidation. Probable atelectasis left lung base. Cardiopericardial silhouette is at upper limits of normal for size. The visualized bony structures of the thorax are intact. Cervical fusion hardware evident with probable surgical drains overlying the lower neck. IMPRESSION: Endotracheal tube tip 4.8 cm above the base of the carina. Low volumes with left base atelectasis. Electronically Signed   By: Misty Stanley M.D.   On: 06/27/2016 17:13   Dg C-arm 61-120 Min  Result Date: 06/27/2016 CLINICAL DATA:  C2-7 posterior cervical fusion. EXAM: CERVICAL SPINE - 2-3 VIEW; DG C-ARM 61-120 MIN COMPARISON:  MRI 06/26/2016. FLUOROSCOPY TIME: C-arm fluoroscopic images were obtained intraoperatively and submitted for post operative interpretation. Please see the performing provider's procedural report for the fluoroscopy time utilized. FINDINGS: Four spot fluoroscopic images are submitted from the operating room. These demonstrate a pre-existing anterior plate and screws extending inferiorly from C4. The inferior extent of the hardware is not visualized in the lateral projection, although based on the frontal examination  and previous MRI and, extends from C4 through C7. There are new posterior pedicle screws and rods extending from C2  through C6. The alignment is normal. No perioperative complications are identified. IMPRESSION: Intraoperative views during revision of cervical fusion with posterior components extending from C2 through C6. No demonstrated complication. Electronically Signed   By: Richardean Sale M.D.   On: 06/27/2016 15:12     Assessment/Plan: Diagnosis: Central cord syndrome Labs and images independently reviewed.  Records reviewed and summated above.  1. Does the need for close, 24 hr/day medical supervision in concert with the patient's rehab needs make it unreasonable for this patient to be served in a less intensive setting? Yes 2. Co-Morbidities requiring supervision/potential complications: diabetes mellitus (Monitor in accordance with exercise and adjust meds as necessary), CVA 2010, HTN (monitor and provide prns in accordance with increased physical exertion and pain), cervical discectomy 2004 and lumbar disc surgery, tobacco abuse (counsel), diastolic dysfunction (monitor for signs/symptoms of fluid overload), Acute blood loss anemia (transfuse if necessary to ensure appropriate perfusion for increased activity tolerance), post-op pain (Biofeedback training with therapies to help reduce reliance on opiate pain medications, particularly IV morphine, monitor pain control during therapies, and sedation at rest and titrate to maximum efficacy to ensure participation and gains in therapies), Tachycardia (monitor in accordance with pain and increasing activity), leukocytosis (cont to monitor for signs and symptoms of infection, further workup if indicated), lumbar stenosis (?surgery next week), OSA (cont CPAP, monitor for daytime somnolence) 3. Due to bladder management, bowel management, safety, skin/wound care, disease management, medication administration, pain management and patient education,  does the patient require 24 hr/day rehab nursing? Yes 4. Does the patient require coordinated care of a physician, rehab nurse, PT (1-2 hrs/day, 5 days/week) and OT (1-2 hrs/day, 5 days/week) to address physical and functional deficits in the context of the above medical diagnosis(es)? Yes Addressing deficits in the following areas: balance, endurance, locomotion, strength, transferring, bowel/bladder control, bathing, dressing, feeding, grooming, toileting and psychosocial support 5. Can the patient actively participate in an intensive therapy program of at least 3 hrs of therapy per day at least 5 days per week? Yes 6. The potential for patient to make measurable gains while on inpatient rehab is excellent 7. Anticipated functional outcomes upon discharge from inpatient rehab are min assist  with PT, min assist with OT, n/a with SLP. 8. Estimated rehab length of stay to reach the above functional goals is: 14-17 days. 9. Does the patient have adequate social supports and living environment to accommodate these discharge functional goals? Potentially 10. Anticipated D/C setting: TBD 11. Anticipated post D/C treatments: TBD 12. Overall Rehab/Functional Prognosis: good  RECOMMENDATIONS: This patient's condition is appropriate for continued rehabilitative care in the following setting: Pt with plan for ?lumbar surgery next week.  This will change is functional status depending on the extent of the surgery.  Will cont to follow.  Pt will likely need signficant assistance at discharge. Patient has agreed to participate in recommended program. Potentially Note that insurance prior authorization may be required for reimbursement for recommended care.  Comment: Rehab Admissions Coordinator to follow up.  Delice Lesch, MD, Mellody Drown Cathlyn Parsons., PA-C 06/29/2016    Revision History                        Routing History

## 2016-07-04 NOTE — Progress Notes (Signed)
    Subjective: Procedure(s) (LRB): POSTERIOR CERVICAL FUSION C2-C5; C2-C4 Decompression (N/A) 7 Days Post-Op  Patient reports pain as 3 on 0-10 scale.  Reports deferred at this time arm pain reports incisional neck pain   Positive void Negative bowel movement Positive flatus Negative chest pain or shortness of breath  Objective: Vital signs in last 24 hours: Temp:  [98 F (36.7 C)-99.1 F (37.3 C)] 98.5 F (36.9 C) (03/12 0921) Pulse Rate:  [71-95] 94 (03/12 0921) Resp:  [18-20] 18 (03/12 0921) BP: (114-156)/(69-93) 129/69 (03/12 0921) SpO2:  [97 %-100 %] 98 % (03/12 0921)  Intake/Output from previous day: 03/11 0701 - 03/12 0700 In: -  Out: 1350 [Urine:1350]  Labs: No results for input(s): WBC, RBC, HCT, PLT in the last 72 hours. No results for input(s): NA, K, CL, CO2, BUN, CREATININE, GLUCOSE, CALCIUM in the last 72 hours. No results for input(s): LABPT, INR in the last 72 hours.  Physical Exam: Intact pulses distally Incision: dressing C/D/I Compartment soft  Assessment/Plan: Patient stable  Mobilization with physical therapy Agree with transfer to CIR Encourage incentive spirometry Continue care  Enema and mag citrate for constipation   Melina Schools, MD [place] [phone]

## 2016-07-04 NOTE — Care Management Important Message (Signed)
Important Message  Patient Details  Name: Rodney Crane MRN: [id number] Date of Birth: 02-Jun-1950   Medicare Important Message Given:  Yes    Jaxn Chiquito Montine Circle 07/04/2016, 12:19 PM

## 2016-07-04 NOTE — PMR Pre-admission (Signed)
PMR Admission Coordinator Pre-Admission Assessment  Patient: Rodney Crane is an 66 y.o., male MRN: [id number] DOB: 21-Mar-1951 Height: 6\' 4"  (193 cm) Weight: 133.7 kg (294 lb 11.2 oz)              Insurance Information HMO:     PPO:      PCP:      IPA:      80/20: yes     OTHER:  No HMO PRIMARY: Medicare a and B      Policy#: [id number]      Subscriber: pt Benefits:  Phone #: passport one online     Name: online Eff. Date: 09/23/2000     Deduct: $1340      Out of Pocket Max: none      Life Max: none CIR: 100%      SNF: 20 full days Outpatient: 80%     Co-Pay: 20% Home Health: 100%      Co-Pay: none DME: 80%     Co-Pay: 20% Providers: pt choice  SECONDARY: [employer]      Policy#: [id number]      Subscriber: pt  Medicaid Application Date:       Case Manager:  Disability Application Date:       Case Worker:   Emergency [place] Information    Name Relation Home Work Pump Back Spouse [phone]  [phone]     Current Medical History  Patient Admitting Diagnosis:central cord syndrome  History of Present Illness:  Rodney Crane a 66 y.o.right handed malewith history of diabetes mellitus, CVA 2010, hypertension, cervical discectomy 2004 and multiple lumbar disc surgery and tobacco abuse.  Patient reports he was independent until approximately one month ago and then with multiple falls and now using a rolling walker for short distance ambulation. Present 06/26/2016 with progressive back and leg pain as well as multiple falls followed as an outpatient by Dr. Rolena Infante. MRI reviewed, showing spinal stenosis C3-4 level as well as C2-3. Preoperative clearance echocardiogram with ejection fraction 40% grade 1 diastolic dysfunction. Underwent posterior cervical fusion C2-C5, C2-C4 decompression 06/26/2016 per Dr. Rolena Infante. Patient did require short-term intubation until 06/28/2016. Acute blood loss anemia and was transfused. Hospital course pain management. Hard  cervical collar at all times.Previous lumbar MRI done December 2017 showed significant spinal stenosis L1-S1. Previous lumbar decompression L2-S1. Multiple level facet arthrosis contributing to lateral recess stenosis and continues to be followed by orthopedic services awaiting planned for further revision decompression of Lumbar spinal stenosis in approximately the next 2 weeks.   Past Medical History  Past Medical History:  Diagnosis Date  . Arthritis   . Colon polyps   . CVA (cerebral infarction) 2010  . Diabetes mellitus   . Dizziness   . ED (erectile dysfunction)   . Foot drop   . H/O Bell's palsy   . Hyperlipidemia   . Hypertension   . Obesity   . OSA on CPAP   . Spinal stenosis   . Stroke ([place])   . Unsteady gait     Family History  family history includes Cancer in his father; Dementia in his mother; Hypertension in his brother; Multiple sclerosis in his sister; Sleep apnea in his brother.  Prior Rehab/Hospitalizations:  Has the patient had major surgery during 100 days prior to admission? No  Current Medications   Current Facility-Administered Medications:  .  0.9 %  sodium chloride infusion, 250 mL, Intravenous, Continuous, Melina Schools, MD .  hydrochlorothiazide (MICROZIDE) capsule  12.5 mg, 12.5 mg, Oral, Daily, Mariel Aloe, MD, 12.5 mg at 07/04/16 1039 .  insulin aspart (novoLOG) injection 0-20 Units, 0-20 Units, Subcutaneous, TID AC & HS, Jose Shirl Harris, MD, 3 Units at 07/04/16 [phone] .  irbesartan (AVAPRO) tablet 300 mg, 300 mg, Oral, Daily, Mariel Aloe, MD, 300 mg at 07/04/16 1039 .  labetalol (NORMODYNE,TRANDATE) injection 10 mg, 10 mg, Intravenous, Q2H PRN, Gardiner Barefoot, NP, 10 mg at 07/02/16 0225 .  magnesium citrate solution 1 Bottle, 1 Bottle, Oral, Once, Mariel Aloe, MD .  menthol-cetylpyridinium (CEPACOL) lozenge 3 mg, 1 lozenge, Oral, PRN **OR** phenol (CHLORASEPTIC) mouth spray 1 spray, 1 spray, Mouth/Throat, PRN, Melina Schools, MD .   methocarbamol (ROBAXIN) tablet 500 mg, 500 mg, Oral, Q6H PRN, 500 mg at 07/03/16 1344 **OR** [DISCONTINUED] methocarbamol (ROBAXIN) 500 mg in dextrose 5 % 50 mL IVPB, 500 mg, Intravenous, Q6H PRN, Melina Schools, MD, 500 mg at 06/30/16 0214 .  metoprolol succinate (TOPROL-XL) 24 hr tablet 50 mg, 50 mg, Oral, Daily, Mariel Aloe, MD, 50 mg at 07/04/16 1039 .  ondansetron (ZOFRAN) tablet 4 mg, 4 mg, Oral, Q6H PRN **OR** [DISCONTINUED] ondansetron (ZOFRAN) injection 4 mg, 4 mg, Intravenous, Q6H PRN, Melina Schools, MD .  oxyCODONE (Oxy IR/ROXICODONE) immediate release tablet 10 mg, 10 mg, Oral, Q4H PRN, Melina Schools, MD, 10 mg at 07/04/16 1039 .  pantoprazole (PROTONIX) EC tablet 40 mg, 40 mg, Oral, q1800, Mariel Aloe, MD, 40 mg at 07/03/16 1739 .  polyethylene glycol (MIRALAX / GLYCOLAX) packet 17 g, 17 g, Oral, BID, Mariel Aloe, MD, 17 g at 07/04/16 1039 .  senna-docusate (Senokot-S) tablet 1 tablet, 1 tablet, Oral, QHS, Mariel Aloe, MD, 1 tablet at 07/03/16 2154 .  sodium chloride flush (NS) 0.9 % injection 3 mL, 3 mL, Intravenous, Q12H, Melina Schools, MD, 3 mL at 07/04/16 1000 .  sodium chloride flush (NS) 0.9 % injection 3 mL, 3 mL, Intravenous, PRN, Melina Schools, MD  Patients Current Diet: DIET SOFT Room service appropriate? Yes; Fluid consistency: Thin  Precautions / Restrictions Precautions Precautions: Cervical Precaution Comments: Hemovac Cervical Brace: Hard collar, Other (comment) Restrictions Weight Bearing Restrictions: No   Has the patient had 2 or more falls or a fall with injury in the past year?Yes Initial fall 05/28/2016 with multiple near falls per pt and wife after that prior to admit  Prior Activity Level Limited Community (1-2x/wk): pt was independent prior to initial fall 05/28/16. Decreasd ability to walk and fine motor skills since fall  Home Assistive Devices / [place] Devices/Equipment: Bedside commode/3-in-1, CPAP, Walker (specify  type) Home Equipment: Walker - 2 wheels, Bedside commode, Shower seat  Prior Device Use: Indicate devices/aids used by the patient prior to current illness, exacerbation or injury? Walker  Prior Functional Level Prior Function Level of Independence: Needs assistance Gait / Transfers Assistance Needed: Pt reports that one month ago he was independent and moving well. Since then, he has had multiple falls at home; amb short distances with RW.  ADL's / Homemaking Assistance Needed: Requires assist for ADLs/IADLs Comments: Retired from work. Uses CPAP.   Self Care: Did the patient need help bathing, dressing, using the toilet or eating?  Needed some help  Indoor Mobility: Did the patient need assistance with walking from room to room (with or without device)? Needed some help  Stairs: Did the patient need assistance with internal or external stairs (with or without device)? Needed some help  Functional Cognition: Did the patient need help planning regular tasks such as shopping or remembering to take medications? Needed some help  Current Functional Level Cognition  Overall Cognitive Status: Within Functional Limits for tasks assessed Orientation Level: Oriented X4    Extremity Assessment (includes Sensation/Coordination)  Upper Extremity Assessment: RUE deficits/detail, LUE deficits/detail RUE Deficits / Details: Difficult to assess due to increased pain with attempts at ROM.  Elbow grossly 3+/5, grasp and release of hand grossly 3/5 - pt with board on Rt wrist due to A-line so unable to assess wrist ROM  RUE: Unable to fully assess due to pain RUE Sensation: decreased light touch RUE Coordination: decreased fine motor, decreased gross motor LUE Deficits / Details: Grossly 3/5 shoulder; elbow grossly 3/5 - 3+/5; wrist grossly 3/5; hand grasp/release 3+/5   Lower Extremity Assessment: Defer to PT evaluation RLE Deficits / Details: Grossly ~2+/5 knee extension, hip flexion, 0/5 DF, 1/5  PF RLE Sensation: decreased light touch RLE Coordination: decreased fine motor, decreased gross motor LLE Deficits / Details: Grossly ~3/5 knee extension, 3/5 hip flexion, 4/5 DF/PF LLE Sensation:  (Sensation WFL.) LLE Coordination: decreased fine motor, decreased gross motor    ADLs  Overall ADL's : Needs assistance/impaired Eating/Feeding: Set up, Sitting Eating/Feeding Details (indicate cue type and reason): pt provided red and blue foam for meal. pt able to self feed with adjustment to HOB increase, tray positioning and L UE supported on bedside tray at elbow. pt states incr discomfor with head increase but noted less coughing with meal. pt states "i can swallow better like this but its not as comfortable. " pt noted to have cough several times attempting to have BIL LE up and chair slighly reclined with self feeding Grooming: Wash/dry hands, Wash/dry face, Minimal assistance, Bed level Upper Body Bathing: Total assistance, Bed level Lower Body Bathing: Total assistance, Bed level Upper Body Dressing : Total assistance, Bed level Lower Body Dressing: Total assistance, Bed level Toilet Transfer: Maximal assistance, +2 for physical assistance, Stand-pivot (using Stedy) Armed forces technical officer Details (indicate cue type and reason): uanble to attempt  Toileting- Clothing Manipulation and Hygiene: Total assistance, Bed level Functional mobility during ADLs: Maximal assistance, +2 for physical assistance General ADL Comments: pt with brace off in bed. pt reports busy schedule this AM but motivated to be oOB. tp and wife educated on allowing patient to self feed due to wife feeding him on arrival. pt states "i understand. I did do it yesterday" pt demonstrates powering up this session    Mobility  Overal bed mobility: Needs Assistance Bed Mobility: Rolling, Supine to Sit Rolling: +2 for physical assistance, Max assist Sidelying to sit: Max assist, +2 for physical assistance Supine to sit: +2 for  physical assistance, Mod assist Sit to sidelying: Max assist, +2 for physical assistance General bed mobility comments: patient able initiation and hold rail during transition, increased assist to power to upright and reposition at EOB    Transfers  Overall transfer level: Needs assistance Equipment used: 2 person hand held assist (face to face with gait belt) Transfer via Lift Equipment: Stedy Transfers: Sit to/from Stand Sit to Stand: Mod assist, +2 physical assistance (2 person face to face with gait belt ) Stand pivot transfers: Max assist, +2 physical assistance General transfer comment: performed sit <> stand x2 and Stand pivot OOB to chair with tactile faciliatation at right hip extensor reaction with bilateral LE knee blocking. Patient tolerated increased time in standing but did show some signs of orthostatsis reaction,  recommend LE wrapping during upright activity going forward.     Ambulation / Gait / Stairs / Office manager / Balance Dynamic Sitting Balance Sitting balance - Comments: improved sitting balance, able to perform static and dynamic activity at EOB today Balance Overall balance assessment: Needs assistance Sitting-balance support: Bilateral upper extremity supported, Feet supported Sitting balance-Leahy Scale: Fair Sitting balance - Comments: improved sitting balance, able to perform static and dynamic activity at EOB today Standing balance support: Bilateral upper extremity supported, During functional activity Standing balance-Leahy Scale: Poor Standing balance comment: patient able to tolerate upright static standing >30 second bouts x2 (some evidence of orthostatic reaction. Pt did required initiate tactile faciliatation to upright but once upright able to lock-set LEs and maintain standing with 2 person bilateral moderate to max assist.     Special needs/care consideration BiPAP/CPAP  yes CPM  N/a Continuous Drip IV  N/a Dialysis   N/a Life Vest  N/a Oxygen  N/a Special Bed  N/a Trach Size  N/a Wound Vac (area) n/a Skin surgical incision Bowel mgmt: LBM charted 06/26/2016. Pt states it was one to two weeks pta. Abd Xray 07/04/16 with significant stool burden. Pt has been taking senokot and miralax with out results. Attending MD to order other measures Bladder mgmt: has been using urinal but 48 hrs prior to admit to CIR, pt reports difficulty voiding due to abdominal distention. Pt states Mg citrate not effective pta. H/o constipation issues. Diabetic mgmt yes pta Needs special call bell due to decreased fine motor skills   Previous Home Environment Living Arrangements: Spouse/significant other  Lives With: Spouse Available Help at Discharge: Family, Available 24 hours/day Type of Home: House Home Layout: One level Home Access: Stairs to enter [employer] of Steps: 1/2 step (porch stoop) Bathroom Shower/Tub: Public librarian, Architectural technologist: Standard Bathroom Accessibility: Yes How Accessible: Accessible via walker [place]: No  Discharge Living Setting Plans for Discharge Living Setting: Patient's home, Lives with (comment) (wife) Type of Home at Discharge: House Discharge Home Layout: One level Discharge Home Access: Stairs to enter Entrance Stairs-Rails: None Entrance Stairs-Number of Steps: 1/2 step onto porch Discharge Bathroom Shower/Tub: Tub/shower unit, Curtain Discharge Bathroom Toilet: Standard Discharge Bathroom Accessibility: Yes How Accessible: Accessible via walker Does the patient have any problems obtaining your medications?: No  Social/Family/Support Systems Patient Roles: Spouse, Parent (3 children in [place]) Contact Information: wife Anticipated Caregiver: wife; children may come to assist only part time Anticipated Caregiver's Contact Information: see above Ability/Limitations of Caregiver: wife can provide min assist level; she has had back surgeries  also Caregiver Availability: 24/7 Discharge Plan Discussed with Primary Caregiver: Yes Is Caregiver In Agreement with Plan?: Yes Does Caregiver/Family have Issues with Lodging/Transportation while Pt is in Rehab?: No (wife stays with pt in hospital most of the time)   Goals/Additional Needs Patient/Family Goal for Rehab: Min assist with PT and OT, Mod I with SLP Expected length of stay: [facility] 10- 14 days then possibl ereturn to acute for Lumbar surgery and return to CIR postoperatively after recuperation on acute for a few days Equipment Needs: Needs nurse call light touch pad due to decreaed fine motor skills Pt/Family Agrees to Admission and willing to participate: Yes Program Orientation Provided & Reviewed with Pt/Caregiver Including Roles  & Responsibilities: Yes   Decrease burden of Care through IP rehab admission: plan for CIR admit prior to second lumbar surgery planned and likely readmit to CIR after appropriate  postoperative acute stay to complete his rehab.  Possible need for SNF placement upon discharge: not anticipated by pt and wife, but depends on his recovery level.  Patient Condition: This patient's medical and functional status has changed since the consult dated 06/29/2016 in which the Rehabilitation Physician determined and documented that the patient was potentially appropriate for intensive rehabilitative care in an inpatient rehabilitation facility. Issues have been addressed and update has been discussed with Dr. Posey Pronto and patient now appropriate for inpatient rehabilitation. Plan for admit prior to lumbar surgery to prepare for second surgery. Will admit to inpatient rehab today.   Preadmission Screen Completed By:  Cleatrice Burke, 07/04/2016 11:34 AM ______________________________________________________________________   Discussed status with Dr. Posey Pronto on 07/04/2016 at  1134 and received telephone approval for admission today.  Admission Coordinator:  Cleatrice Burke, time 1134 Date 07/04/2016.

## 2016-07-04 NOTE — Progress Notes (Signed)
Rodney Gong, RN Rehab Admission Coordinator Signed Physical Medicine and Rehabilitation  PMR Pre-admission Date of Service: 07/04/2016 11:22 AM  Related encounter: ED to Hosp-Admission (Current) from 06/25/2016 in [place]       [] Hide copied text PMR Admission Coordinator Pre-Admission Assessment  Patient: Rodney Crane is an 66 y.o., male MRN: [id number] DOB: 05-Jan-1951 Height: 6\' 4"  (193 cm) Weight: 133.7 kg (294 lb 11.2 oz)                                                                                                                                                  Insurance Information HMO:     PPO:      PCP:      IPA:      80/20: yes     OTHER:  No HMO PRIMARY: Medicare a and B      Policy#: [id number]      Subscriber: pt Benefits:  Phone #: passport one online     Name: online Eff. Date: 09/23/2000     Deduct: $1340      Out of Pocket Max: none      Life Max: none CIR: 100%      SNF: 20 full days Outpatient: 80%     Co-Pay: 20% Home Health: 100%      Co-Pay: none DME: 80%     Co-Pay: 20% Providers: pt choice  SECONDARY: [employer]      Policy#: [id number]      Subscriber: pt  Medicaid Application Date:       Case Manager:  Disability Application Date:       Case Worker:   Emergency [place] Information    Name Relation Home Work [place] Spouse [phone]  [phone]     Current Medical History  Patient Admitting Diagnosis:central cord syndrome  History of Present Illness:  Rodney Crane a 66 y.o.right handed malewith history of diabetes mellitus, CVA 2010, hypertension, cervical discectomy 2004 and multiplelumbar disc surgery and tobacco abuse.  Patient reports he was independent until approximately one month ago and then with multiple falls and now using a rolling walker for short distance ambulation. Present 06/26/2016 with progressive back and leg pain as well as  multiple falls followed as an outpatient by Dr. Rolena Infante. MRI reviewed, showing spinal stenosis C3-4 level as well as C2-3. Preoperative clearance echocardiogram with ejection fraction 26% grade 1 diastolic dysfunction. Underwent posterior cervical fusion C2-C5, C2-C4 decompression 06/26/2016 per Dr. Rolena Infante. Patient did require short-term intubation until 06/28/2016. Acute blood loss anemia and was transfused. Hospital course pain management. Hard cervical collar at all times.Previous lumbar MRI done December 2017 showed significant spinal stenosis L1-S1. Previous lumbar decompression L2-S1. Multiple level facet arthrosis contributing to lateral recess stenosis and continues  to be followed by orthopedic services awaiting planned for further revision decompression of Lumbarspinal stenosisin approximately the next 2 weeks.   Past Medical History      Past Medical History:  Diagnosis Date  . Arthritis   . Colon polyps   . CVA (cerebral infarction) 2010  . Diabetes mellitus   . Dizziness   . ED (erectile dysfunction)   . Foot drop   . H/O Bell's palsy   . Hyperlipidemia   . Hypertension   . Obesity   . OSA on CPAP   . Spinal stenosis   . Stroke ([place])   . Unsteady gait     Family History  family history includes Cancer in his father; Dementia in his mother; Hypertension in his brother; Multiple sclerosis in his sister; Sleep apnea in his brother.  Prior Rehab/Hospitalizations:  Has the patient had major surgery during 100 days prior to admission? No  Current Medications   Current Facility-Administered Medications:  .  0.9 %  sodium chloride infusion, 250 mL, Intravenous, Continuous, Melina Schools, MD .  hydrochlorothiazide (MICROZIDE) capsule 12.5 mg, 12.5 mg, Oral, Daily, Mariel Aloe, MD, 12.5 mg at 07/04/16 1039 .  insulin aspart (novoLOG) injection 0-20 Units, 0-20 Units, Subcutaneous, TID AC & HS, Jose Shirl Harris, MD, 3 Units at 07/04/16 [phone] .   irbesartan (AVAPRO) tablet 300 mg, 300 mg, Oral, Daily, Mariel Aloe, MD, 300 mg at 07/04/16 1039 .  labetalol (NORMODYNE,TRANDATE) injection 10 mg, 10 mg, Intravenous, Q2H PRN, Gardiner Barefoot, NP, 10 mg at 07/02/16 0225 .  magnesium citrate solution 1 Bottle, 1 Bottle, Oral, Once, Mariel Aloe, MD .  menthol-cetylpyridinium (CEPACOL) lozenge 3 mg, 1 lozenge, Oral, PRN **OR** phenol (CHLORASEPTIC) mouth spray 1 spray, 1 spray, Mouth/Throat, PRN, Melina Schools, MD .  methocarbamol (ROBAXIN) tablet 500 mg, 500 mg, Oral, Q6H PRN, 500 mg at 07/03/16 1344 **OR** [DISCONTINUED] methocarbamol (ROBAXIN) 500 mg in dextrose 5 % 50 mL IVPB, 500 mg, Intravenous, Q6H PRN, Melina Schools, MD, 500 mg at 06/30/16 0214 .  metoprolol succinate (TOPROL-XL) 24 hr tablet 50 mg, 50 mg, Oral, Daily, Mariel Aloe, MD, 50 mg at 07/04/16 1039 .  ondansetron (ZOFRAN) tablet 4 mg, 4 mg, Oral, Q6H PRN **OR** [DISCONTINUED] ondansetron (ZOFRAN) injection 4 mg, 4 mg, Intravenous, Q6H PRN, Melina Schools, MD .  oxyCODONE (Oxy IR/ROXICODONE) immediate release tablet 10 mg, 10 mg, Oral, Q4H PRN, Melina Schools, MD, 10 mg at 07/04/16 1039 .  pantoprazole (PROTONIX) EC tablet 40 mg, 40 mg, Oral, q1800, Mariel Aloe, MD, 40 mg at 07/03/16 1739 .  polyethylene glycol (MIRALAX / GLYCOLAX) packet 17 g, 17 g, Oral, BID, Mariel Aloe, MD, 17 g at 07/04/16 1039 .  senna-docusate (Senokot-S) tablet 1 tablet, 1 tablet, Oral, QHS, Mariel Aloe, MD, 1 tablet at 07/03/16 2154 .  sodium chloride flush (NS) 0.9 % injection 3 mL, 3 mL, Intravenous, Q12H, Melina Schools, MD, 3 mL at 07/04/16 1000 .  sodium chloride flush (NS) 0.9 % injection 3 mL, 3 mL, Intravenous, PRN, Melina Schools, MD  Patients Current Diet: DIET SOFT Room service appropriate? Yes; Fluid consistency: Thin  Precautions / Restrictions Precautions Precautions: Cervical Precaution Comments: Hemovac Cervical Brace: Hard collar, Other (comment) Restrictions Weight  Bearing Restrictions: No   Has the patient had 2 or more falls or a fall with injury in the past year?Yes Initial fall 05/28/2016 with multiple near falls per pt and wife after that prior to  admit  Prior Activity Level Limited Community (1-2x/wk): pt was independent prior to initial fall 05/28/16. Decreasd ability to walk and fine motor skills since fall  Home Assistive Devices / [place] Devices/Equipment: Bedside commode/3-in-1, CPAP, Walker (specify type) Home Equipment: Walker - 2 wheels, Bedside commode, Shower seat  Prior Device Use: Indicate devices/aids used by the patient prior to current illness, exacerbation or injury? Walker  Prior Functional Level Prior Function Level of Independence: Needs assistance Gait / Transfers Assistance Needed: Pt reports that one month ago he was independent and moving well. Since then, he has had multiple falls at home; amb short distances with RW.  ADL's / Homemaking Assistance Needed: Requires assist for ADLs/IADLs Comments: Retired from work. Uses CPAP.   Self Care: Did the patient need help bathing, dressing, using the toilet or eating?  Needed some help  Indoor Mobility: Did the patient need assistance with walking from room to room (with or without device)? Needed some help  Stairs: Did the patient need assistance with internal or external stairs (with or without device)? Needed some help  Functional Cognition: Did the patient need help planning regular tasks such as shopping or remembering to take medications? Needed some help  Current Functional Level Cognition  Overall Cognitive Status: Within Functional Limits for tasks assessed Orientation Level: Oriented X4    Extremity Assessment (includes Sensation/Coordination)  Upper Extremity Assessment: RUE deficits/detail, LUE deficits/detail RUE Deficits / Details: Difficult to assess due to increased pain with attempts at ROM.  Elbow grossly 3+/5, grasp and  release of hand grossly 3/5 - pt with board on Rt wrist due to A-line so unable to assess wrist ROM  RUE: Unable to fully assess due to pain RUE Sensation: decreased light touch RUE Coordination: decreased fine motor, decreased gross motor LUE Deficits / Details: Grossly 3/5 shoulder; elbow grossly 3/5 - 3+/5; wrist grossly 3/5; hand grasp/release 3+/5   Lower Extremity Assessment: Defer to PT evaluation RLE Deficits / Details: Grossly ~2+/5 knee extension, hip flexion, 0/5 DF, 1/5 PF RLE Sensation: decreased light touch RLE Coordination: decreased fine motor, decreased gross motor LLE Deficits / Details: Grossly ~3/5 knee extension, 3/5 hip flexion, 4/5 DF/PF LLE Sensation:  (Sensation WFL.) LLE Coordination: decreased fine motor, decreased gross motor    ADLs  Overall ADL's : Needs assistance/impaired Eating/Feeding: Set up, Sitting Eating/Feeding Details (indicate cue type and reason): pt provided red and blue foam for meal. pt able to self feed with adjustment to HOB increase, tray positioning and L UE supported on bedside tray at elbow. pt states incr discomfor with head increase but noted less coughing with meal. pt states "i can swallow better like this but its not as comfortable. " pt noted to have cough several times attempting to have BIL LE up and chair slighly reclined with self feeding Grooming: Wash/dry hands, Wash/dry face, Minimal assistance, Bed level Upper Body Bathing: Total assistance, Bed level Lower Body Bathing: Total assistance, Bed level Upper Body Dressing : Total assistance, Bed level Lower Body Dressing: Total assistance, Bed level Toilet Transfer: Maximal assistance, +2 for physical assistance, Stand-pivot (using Stedy) Armed forces technical officer Details (indicate cue type and reason): uanble to attempt  Toileting- Clothing Manipulation and Hygiene: Total assistance, Bed level Functional mobility during ADLs: Maximal assistance, +2 for physical assistance General ADL  Comments: pt with brace off in bed. pt reports busy schedule this AM but motivated to be oOB. tp and wife educated on allowing patient to self feed due to wife feeding  him on arrival. pt states "i understand. I did do it yesterday" pt demonstrates powering up this session    Mobility  Overal bed mobility: Needs Assistance Bed Mobility: Rolling, Supine to Sit Rolling: +2 for physical assistance, Max assist Sidelying to sit: Max assist, +2 for physical assistance Supine to sit: +2 for physical assistance, Mod assist Sit to sidelying: Max assist, +2 for physical assistance General bed mobility comments: patient able initiation and hold rail during transition, increased assist to power to upright and reposition at EOB    Transfers  Overall transfer level: Needs assistance Equipment used: 2 person hand held assist (face to face with gait belt) Transfer via Lift Equipment: Stedy Transfers: Sit to/from Stand Sit to Stand: Mod assist, +2 physical assistance (2 person face to face with gait belt ) Stand pivot transfers: Max assist, +2 physical assistance General transfer comment: performed sit <> stand x2 and Stand pivot OOB to chair with tactile faciliatation at right hip extensor reaction with bilateral LE knee blocking. Patient tolerated increased time in standing but did show some signs of orthostatsis reaction, recommend LE wrapping during upright activity going forward.     Ambulation / Gait / Stairs / Office manager / Balance Dynamic Sitting Balance Sitting balance - Comments: improved sitting balance, able to perform static and dynamic activity at EOB today Balance Overall balance assessment: Needs assistance Sitting-balance support: Bilateral upper extremity supported, Feet supported Sitting balance-Leahy Scale: Fair Sitting balance - Comments: improved sitting balance, able to perform static and dynamic activity at EOB today Standing balance support: Bilateral  upper extremity supported, During functional activity Standing balance-Leahy Scale: Poor Standing balance comment: patient able to tolerate upright static standing >30 second bouts x2 (some evidence of orthostatic reaction. Pt did required initiate tactile faciliatation to upright but once upright able to lock-set LEs and maintain standing with 2 person bilateral moderate to max assist.     Special needs/care consideration BiPAP/CPAP  yes CPM  N/a Continuous Drip IV  N/a Dialysis  N/a Life Vest  N/a Oxygen  N/a Special Bed  N/a Trach Size  N/a Wound Vac (area) n/a Skin surgical incision Bowel mgmt: LBM charted 06/26/2016. Pt states it was one to two weeks pta. Abd Xray 07/04/16 with significant stool burden. Pt has been taking senokot and miralax with out results. Attending MD to order other measures Bladder mgmt: has been using urinal but 48 hrs prior to admit to CIR, pt reports difficulty voiding due to abdominal distention. Pt states Mg citrate not effective pta. H/o constipation issues. Diabetic mgmt yes pta Needs special call bell due to decreased fine motor skills   Previous Home Environment Living Arrangements: Spouse/significant other  Lives With: Spouse Available Help at Discharge: Family, Available 24 hours/day Type of Home: House Home Layout: One level Home Access: Stairs to enter [employer] of Steps: 1/2 step (porch stoop) Bathroom Shower/Tub: Public librarian, Architectural technologist: Standard Bathroom Accessibility: Yes How Accessible: Accessible via walker Swisher: No  Discharge Living Setting Plans for Discharge Living Setting: Patient's home, Lives with (comment) (wife) Type of Home at Discharge: House Discharge Home Layout: One level Discharge Home Access: Stairs to enter Entrance Stairs-Rails: None Entrance Stairs-Number of Steps: 1/2 step onto porch Discharge Bathroom Shower/Tub: Tub/shower unit, Curtain Discharge Bathroom Toilet:  Standard Discharge Bathroom Accessibility: Yes How Accessible: Accessible via walker Does the patient have any problems obtaining your medications?: No  Social/Family/Support Systems Patient Roles: Spouse, Parent (  3 children in [place]) Contact Information: wife Anticipated Caregiver: wife; children may come to assist only part time Anticipated Caregiver's Contact Information: see above Ability/Limitations of Caregiver: wife can provide min assist level; she has had back surgeries also Caregiver Availability: 24/7 Discharge Plan Discussed with Primary Caregiver: Yes Is Caregiver In Agreement with Plan?: Yes Does Caregiver/Family have Issues with Lodging/Transportation while Pt is in Rehab?: No (wife stays with pt in hospital most of the time)   Goals/Additional Needs Patient/Family Goal for Rehab: Min assist with PT and OT, Mod I with SLP Expected length of stay: [facility] 10- 14 days then possibl ereturn to acute for Lumbar surgery and return to CIR postoperatively after recuperation on acute for a few days Equipment Needs: Needs nurse call light touch pad due to decreaed fine motor skills Pt/Family Agrees to Admission and willing to participate: Yes Program Orientation Provided & Reviewed with Pt/Caregiver Including Roles  & Responsibilities: Yes   Decrease burden of Care through IP rehab admission: plan for CIR admit prior to second lumbar surgery planned and likely readmit to CIR after appropriate postoperative acute stay to complete his rehab.  Possible need for SNF placement upon discharge: not anticipated by pt and wife, but depends on his recovery level.  Patient Condition: This patient's medical and functional status has changed since the consult dated 06/29/2016 in which the Rehabilitation Physician determined and documented that the patient was potentially appropriate for intensive rehabilitative care in an inpatient rehabilitation facility. Issues have been addressed and update  has been discussed with Dr. Posey Pronto and patient now appropriate for inpatient rehabilitation. Plan for admit prior to lumbar surgery to prepare for second surgery. Will admit to inpatient rehab today.   Preadmission Screen Completed By:  Cleatrice Burke, 07/04/2016 11:34 AM ______________________________________________________________________   Discussed status with Dr. Posey Pronto on 07/04/2016 at  1134 and received telephone approval for admission today.  Admission Coordinator:  Cleatrice Burke, time 1134 Date 07/04/2016.       Cosigned by: Ankit Lorie Phenix, MD at 07/04/2016 11:50 AM  Revision History

## 2016-07-04 NOTE — H&P (Signed)
Physical Medicine and Rehabilitation Admission H&P    Chief Complaint  Patient presents with  . Back Pain  : HPI: Rodney Crane is a 66 y.o. right handed male with history of diabetes mellitus, CVA 2010, hypertension, cervical discectomy 2004 and multiple lumbar disc surgery and tobacco abuse. Per chart review and patient, patient lives with spouse. One level home. Patient reports he was independent until approximately one month ago and then with multiple falls and now using a rolling walker for short distance ambulation. Present 06/26/2016 with progressive back and leg pain as well as multiple falls followed as an outpatient by Dr. Rolena Infante. MRI reviewed, showing spinal stenosis C3-4 level as well as C2-3. Preoperative clearance echocardiogram with ejection fraction 85% grade 1 diastolic dysfunction. Underwent posterior cervical fusion C2-C5, C2-C4 decompression 06/26/2016 per Dr. Rolena Infante. Patient did require short-term intubation until 06/28/2016. Acute blood loss anemia and was transfused. Hospital course pain management. Hard cervical collar at all times.Previous lumbar MRI done December 2017 showed significant spinal stenosis L1-S1. Previous lumbar decompression L2-S1. Multiple level facet arthrosis contributing to lateral recess stenosis and continues to be followed by orthopedic services awaiting planned for further revision decompression of Lumbar spinal stenosis in approximately the next 2 weeks. Physical and occupational therapy evaluation completed 06/28/2016 with recommendations of physical medicine rehabilitation consult. KUB 3/12 reviewed, showing significant stool burden. Patient was admitted for a comprehensive rehabilitation program  Review of Systems  Constitutional: Negative for chills and fever.  HENT: Negative for hearing loss.   Eyes: Negative for blurred vision and double vision.  Respiratory: Positive for shortness of breath. Negative for cough.   Cardiovascular: Positive for  leg swelling. Negative for chest pain and palpitations.  Gastrointestinal: Positive for constipation and nausea. Negative for vomiting.  Genitourinary: Positive for urgency. Negative for dysuria and hematuria.  Musculoskeletal: Positive for back pain and neck pain.  Skin: Negative for rash.  Neurological: Positive for dizziness, sensory change and weakness. Negative for seizures.       Foot drop  All other systems reviewed and are negative.  Past Medical History:  Diagnosis Date  . Arthritis   . Colon polyps   . CVA (cerebral infarction) 2010  . Diabetes mellitus   . Dizziness   . ED (erectile dysfunction)   . Foot drop   . H/O Bell's palsy   . Hyperlipidemia   . Hypertension   . Obesity   . OSA on CPAP   . Spinal stenosis   . Stroke ([place])   . Unsteady gait    Past Surgical History:  Procedure Laterality Date  . CARPAL TUNNEL RELEASE     bilateral  . CERVICAL DISCECTOMY  2004  . LUMBAR DISC SURGERY     x6  . POSTERIOR CERVICAL FUSION/FORAMINOTOMY N/A 06/27/2016   Procedure: POSTERIOR CERVICAL FUSION C2-C5; C2-C4 Decompression;  Surgeon: Melina Schools, MD;  Location: [facility];  Service: Orthopedics;  Laterality: N/A;   Family History  Problem Relation Age of Onset  . Multiple sclerosis Sister   . Dementia Mother   . Cancer Father   . Sleep apnea Brother   . Hypertension Brother   . Colon cancer Neg Hx   . Stomach cancer Neg Hx    Social History:  reports that he has been smoking Cigarettes.  He has never used smokeless tobacco. He reports that he does not drink alcohol or use drugs. Allergies: No Known Allergies Facility-Administered Medications Prior to Admission  Medication Dose Route Frequency Provider Last Rate  Last Dose  . 0.9 %  sodium chloride infusion  500 mL Intravenous Continuous Irene Shipper, MD       Medications Prior to Admission  Medication Sig Dispense Refill  . atorvastatin (LIPITOR) 40 MG tablet Take 20 mg by mouth at bedtime.   7  . clopidogrel  (PLAVIX) 75 MG tablet Take 75 mg by mouth daily.  6  . cyclobenzaprine (FLEXERIL) 10 MG tablet TAKE 1 TABLET BY MOUTH EVERY 8 HOURS AS NEEDED FOR MUSCLE SPASMS  1  . metFORMIN (GLUCOPHAGE) 1000 MG tablet Take 1,000 mg by mouth 2 (two) times daily with a meal.    . metoprolol (TOPROL XL) 50 MG 24 hr tablet Take 50 mg by mouth daily.      . naproxen (NAPROSYN) 500 MG tablet Take 500 mg by mouth 2 (two) times daily. for pain  1  . oxyCODONE-acetaminophen (PERCOCET/ROXICET) 5-325 MG per tablet TAKE 1 TABLET BY MOUTH 3 TIMES DIALY AS NEEDED  0  . valsartan (DIOVAN) 320 MG tablet Take 160 mg by mouth daily.   5    Home: Home Living Family/patient expects to be discharged to:: Private residence Living Arrangements: Spouse/significant other Available Help at Discharge: Family Type of Home: House Home Access: Stairs to enter [employer] of Steps: 1/2 step (porch stoop) Home Layout: One level Bathroom Shower/Tub: Chiropodist: Handicapped height [place]: Environmental consultant - 2 wheels, Bedside commode, Shower seat   Functional History: Prior Function Level of Independence: Needs assistance Gait / Transfers Assistance Needed: Pt reports that one month ago he was independent and moving well. Since then, he has had multiple falls at home; amb short distances with RW.  ADL's / Homemaking Assistance Needed: Requires assist for ADLs/IADLs Comments: Retired from work. Uses CPAP.   Functional Status:  Mobility: Bed Mobility Overal bed mobility: Needs Assistance Bed Mobility: Rolling, Supine to Sit Rolling: +2 for physical assistance, Max assist Sidelying to sit: Max assist, +2 for physical assistance Supine to sit: +2 for physical assistance, Mod assist Sit to sidelying: Max assist, +2 for physical assistance General bed mobility comments: patient able initiation and hold rail during transition, increased assist to power to upright and reposition at EOB Transfers Overall  transfer level: Needs assistance Equipment used: 2 person hand held assist (face to face with gait belt) Transfer via Lift Equipment: Stedy Transfers: Sit to/from Stand Sit to Stand: Mod assist, +2 physical assistance (2 person face to face with gait belt ) Stand pivot transfers: Max assist, +2 physical assistance General transfer comment: performed sit <> stand x2 and Stand pivot OOB to chair with tactile faciliatation at right hip extensor reaction with bilateral LE knee blocking. Patient tolerated increased time in standing but did show some signs of orthostatsis reaction, recommend LE wrapping during upright activity going forward.       ADL: ADL Overall ADL's : Needs assistance/impaired Eating/Feeding: Set up, Sitting Eating/Feeding Details (indicate cue type and reason): pt provided red and blue foam for meal. pt able to self feed with adjustment to [facility] increase, tray positioning and L UE supported on bedside tray at elbow. pt states incr discomfor with head increase but noted less coughing with meal. pt states "i can swallow better like this but its not as comfortable. " pt noted to have cough several times attempting to have BIL LE up and chair slighly reclined with self feeding Grooming: Wash/dry hands, Wash/dry face, Minimal assistance, Bed level Upper Body Bathing: Total assistance, Bed level  Lower Body Bathing: Total assistance, Bed level Upper Body Dressing : Total assistance, Bed level Lower Body Dressing: Total assistance, Bed level Toilet Transfer: Maximal assistance, +2 for physical assistance, Stand-pivot (using Stedy) Armed forces technical officer Details (indicate cue type and reason): uanble to attempt  Toileting- Clothing Manipulation and Hygiene: Total assistance, Bed level Functional mobility during ADLs: Maximal assistance, +2 for physical assistance General ADL Comments: pt with brace off in bed. pt reports busy schedule this AM but motivated to be oOB. tp and wife educated on  allowing patient to self feed due to wife feeding him on arrival. pt states "i understand. I did do it yesterday" pt demonstrates powering up this session  Cognition: Cognition Overall Cognitive Status: Within Functional Limits for tasks assessed Orientation Level: Oriented X4 Cognition Arousal/Alertness: Awake/alert Behavior During Therapy: WFL for tasks assessed/performed Overall Cognitive Status: Within Functional Limits for tasks assessed  Physical Exam: Blood pressure 129/69, pulse 94, temperature 98.5 F (36.9 C), temperature source Oral, resp. rate 18, height 6\' 4"  (1.93 m), weight 133.7 kg (294 lb 11.2 oz), SpO2 98 %. Physical Exam  Vitals reviewed. Constitutional: He is oriented to person, place, and time. He appears well-developed.  66 year old right-handed obese male  HENT:  Head: Normocephalic and atraumatic.  Eyes: Conjunctivae and EOM are normal. Left eye exhibits no discharge.  Neck:  Cervical collar in place  Cardiovascular: Normal rate and regular rhythm.   Respiratory: Effort normal and breath sounds normal. No respiratory distress.  GI: Soft. Bowel sounds are normal.  Abdomen is mildly distended with diminished bowel sounds  Musculoskeletal: He exhibits edema. He exhibits no tenderness.  Neurological: He is alert and oriented to person, place, and time.  Motor: RUE: Shoulder abduction 4-/5, elbow flexion/extension 4/5, wrist/hand 4+/5 LUE: Shoulder abduction 4-/5, elbow flexion/extension 4/5, wrist/hand 4+/5 (stronger than right) RLE: HF, KE 2/5, ADF/PF 2/5 LLE: HF, KE 3+/5, ADF/PF 4/5 Sensation diminished to light touch in hands and feet DTRs symmetric   Skin: Skin is warm and dry.  Psychiatric: He has a normal mood and affect. His behavior is normal. Thought content normal.     Results for orders placed or performed during the hospital encounter of 06/25/16 (from the past 48 hour(s))  Glucose, capillary     Status: Abnormal   Collection Time: 07/02/16  11:35 AM  Result Value Ref Range   Glucose-Capillary 177 (H) 65 - 99 mg/dL  Glucose, capillary     Status: Abnormal   Collection Time: 07/02/16  4:54 PM  Result Value Ref Range   Glucose-Capillary 146 (H) 65 - 99 mg/dL  Glucose, capillary     Status: Abnormal   Collection Time: 07/02/16 10:28 PM  Result Value Ref Range   Glucose-Capillary 156 (H) 65 - 99 mg/dL   Comment 1 Notify RN    Comment 2 Document in Chart   Glucose, capillary     Status: Abnormal   Collection Time: 07/03/16  6:22 AM  Result Value Ref Range   Glucose-Capillary 120 (H) 65 - 99 mg/dL   Comment 1 Notify RN    Comment 2 Document in Chart   Glucose, capillary     Status: Abnormal   Collection Time: 07/03/16 11:05 AM  Result Value Ref Range   Glucose-Capillary 173 (H) 65 - 99 mg/dL  Glucose, capillary     Status: Abnormal   Collection Time: 07/03/16  3:55 PM  Result Value Ref Range   Glucose-Capillary 207 (H) 65 - 99 mg/dL  Triglycerides  Status: None   Collection Time: 07/03/16  5:48 PM  Result Value Ref Range   Triglycerides 102 <150 mg/dL  Glucose, capillary     Status: Abnormal   Collection Time: 07/03/16  9:02 PM  Result Value Ref Range   Glucose-Capillary 121 (H) 65 - 99 mg/dL   Comment 1 Notify RN    Comment 2 Document in Chart    No results found.     Medical Problem List and Plan: 1.  Decreased functional mobility secondary to central cord syndrome status post posterior cervical fusion C2-C5, C2-C4 decompression 06/26/2016 as well as lumbar stenosis with planned surgical intervention 2.  DVT Prophylaxis/Anticoagulation: SCDs. Check vascular study 3. Pain Management: Robaxin and oxycodone as needed 4. Mood: Provide emotional support 5. Neuropsych: This patient is capable of making decisions on his own behalf. 6. Skin/Wound Care: Routine skin checks 7. Fluids/Electrolytes/Nutrition: Routine I&O with follow-up chemistries 8. Acute blood loss anemia. Follow-up CBC 9. OSA. CPAP 10.  Hypertension. HCTZ 12.5 mg daily, Toprol-XL 50 mg daily, Avapro 300 mg daily. Monitor with increased mobility 11. Diabetes mellitus with peripheral neuropathy. Hemoglobin A1c 6.0.SSI.Check blood sugars before meals and at bedtime. Patient on Glucophage 1000 mg twice a day prior to admission. Resume as needed 12. History of CVA. Plavix currently on hold as awaiting plan further lumbar surgery 13. Hyperlipidemia. Lipitor 14. Severe Constipation. Laxative assistance 15. Tobacco abuse: Counsel  Post Admission Physician Evaluation: 1. Preadmission assessment reviewed and changes made below. 2. Functional deficits secondary  to central cord syndrome. 3. Patient is admitted to receive collaborative, interdisciplinary care between the physiatrist, rehab nursing staff, and therapy team. 4. Patient's level of medical complexity and substantial therapy needs in context of that medical necessity cannot be provided at a lesser intensity of care such as a SNF. 5. Patient has experienced substantial functional loss from his/her baseline which was documented above under the "Functional History" and "Functional Status" headings.  Judging by the patient's diagnosis, physical exam, and functional history, the patient has potential for functional progress which will result in measurable gains while on inpatient rehab.  These gains will be of substantial and practical use upon discharge  in facilitating mobility and self-care at the household level. 6. Physiatrist will provide 24 hour management of medical needs as well as oversight of the therapy plan/treatment and provide guidance as appropriate regarding the interaction of the two. 7. The Preadmission Screening has been reviewed and patient status is unchanged unless otherwise stated above. 8. 24 hour rehab nursing will assist with bladder management, bowel management, safety, skin/wound care, disease management, pain management and patient education  and help  integrate therapy concepts, techniques,education, etc. 9. PT will assess and treat for/with: Lower extremity strength, range of motion, stamina, balance, functional mobility, safety, adaptive techniques and equipment, woundcare, coping skills, pain control, education.   Goals are: Min A/Mod A. 10. OT will assess and treat for/with: ADL's, functional mobility, safety, upper extremity strength, adaptive techniques and equipment, wound mgt, ego support, and community reintegration.   Goals are: Min/Mod A. Therapy may not proceed with showering this patient. 11. SLP will assess and treat for/with: speech, swallowing.  Goals are: Mod I. 12. Case Management and Social Worker will assess and treat for psychological issues and discharge planning. 16. Team conference will be held weekly to assess progress toward goals and to determine barriers to discharge. 14. Patient will receive at least 3 hours of therapy per day at least 5 days per week. 15. ELOS:  8-13 days.       16. Prognosis:  good  Delice Lesch, MD, Mellody Drown Cathlyn Parsons., PA-C 07/04/2016

## 2016-07-04 NOTE — Progress Notes (Signed)
Patient ready for transport to 4W rehab; report called earlier to [place], Noreene Larsson.  Delay in transfer was due to administering fleets enema; followed by tap water enema; minimal liquid stool returned; patient repositioned several times; skin cleansed prior to transport. Wife accompanied patient to rehab. Cervical collar maintain on and in aligned position with activity and transfer to Rehab.

## 2016-07-04 NOTE — Care Management Note (Signed)
Case Management Note  Patient Details  Name: Rodney Crane MRN: [id number] Date of Birth: 1951/02/10  Subjective/Objective:                    Action/Plan: Pt discharging to CIR today. No further needs per CM.  Expected Discharge Date:  07/04/16               Expected Discharge Plan:  [place]  In-House Referral:     Discharge planning Services  CM Consult  Post Acute Care Choice:    Choice offered to:     DME Arranged:    DME Agency:     HH Arranged:    HH Agency:     Status of Service:  Completed, signed off  If discussed at [employer], dates discussed:    Additional Comments:  Pollie Friar, RN 07/04/2016, 12:55 PM

## 2016-07-04 NOTE — Progress Notes (Signed)
Physical Therapy Treatment Patient Details Name: Rodney Crane MRN: [id number] DOB: 17-Apr-1951 Today's Date: 07/04/2016    History of Present Illness Patient is a 66 y/o male CVA, HTN, HLD, DM, smoker, OSA on CPAP presents with central cord syndrome s/p C2-5 posterior cervical fusion and C2-4 decompression; with compromised airway after surgery; extubated 3/6.    PT Comments    Patient limited standing due to low BP and poor activity tolerance.  Feel he may need wrapping of LE's to improve tolerance to upright.  Patient with drop to 68/52 with upright activities.  Feel he will benefit from CIR level rehab at d/c.   Follow Up Recommendations  CIR     Equipment Recommendations  None recommended by PT    Recommendations for Other Services       Precautions / Restrictions Precautions Precautions: Cervical;Fall Required Braces or Orthoses: Cervical Brace Cervical Brace: Hard collar;At all times    Mobility  Bed Mobility Overal bed mobility: Needs Assistance Bed Mobility: Rolling;Sidelying to Sit;Sit to Sidelying Rolling: Mod assist;+2 for physical assistance Sidelying to sit: +2 for physical assistance;Max assist     Sit to sidelying: Mod assist;+2 for physical assistance General bed mobility comments: cues and assist to reach for rail and bend opposite knee, assist to bring legs off bed and to lift trunk, assist for legs inbed and guiding trunk to bed  Transfers Overall transfer level: Needs assistance   Transfers: Sit to/from Stand Sit to Stand: Max assist;+2 physical assistance         General transfer comment: Patient with BP taked supine and sitting and close to same (122/75, then 123/72) so did not wrap LE's.  Sit to stand to stedy with +2 lifting help, then in sitting on Stedy pt c/o light headed, BP 68/52 so returned to supine.    Ambulation/Gait                 Stairs            Wheelchair Mobility    Modified Rankin (Stroke Patients Only)       Balance Overall balance assessment: Needs assistance Sitting-balance support: Feet supported;No upper extremity supported Sitting balance-Leahy Scale: Fair Sitting balance - Comments: sitting reaching to sides leaning some and able to return to upright, reaching forward difficult due to neck pain, so arms over chest and leaning forward and back with minguard   Standing balance support: Bilateral upper extremity supported Standing balance-Leahy Scale: Poor Standing balance comment: UE support on stedy and A of two to stand, unable to remain standing, sat on Stedy                     Cognition Arousal/Alertness: Awake/alert Behavior During Therapy: [facility] for tasks assessed/performed Overall Cognitive Status: Within Functional Limits for tasks assessed                      Exercises      General Comments General comments (skin integrity, edema, etc.): wife present during session. reports he was getting up OOB at ICU every day with use of Stedy      Pertinent Vitals/Pain Faces Pain Scale: Hurts little more Pain Location: upper shoulders/neck bilaterally Pain Intervention(s): Monitored during session;Limited activity within patient's tolerance    Home Living                      Prior Function  PT Goals (current goals can now be found in the care plan section) Progress towards PT goals: Not progressing toward goals - comment (limited due to orthostatic symptoms in sitting)    Frequency    Min 5X/week      PT Plan Current plan remains appropriate    Co-evaluation             End of Session Equipment Utilized During Treatment: Gait belt Activity Tolerance: Treatment limited secondary to medical complications (Comment) (low BP) Patient left: in bed;with family/visitor present Nurse Communication: Other (comment) (pt with orthostatic response while upright) PT Visit Diagnosis: Muscle weakness (generalized) (M62.81);Difficulty  in walking, not elsewhere classified (R26.2)     Time: 1561-5379 PT Time Calculation (min) (ACUTE ONLY): 27 min  Charges:  $Therapeutic Activity: 23-37 mins                    G Codes:       Reginia Naas 07/07/2016, 3:46 PM  Magda Kiel, [place] 2016-07-07

## 2016-07-05 ENCOUNTER — Inpatient Hospital Stay (HOSPITAL_COMMUNITY): Payer: Medicare Other | Admitting: Speech Pathology

## 2016-07-05 ENCOUNTER — Institutional Professional Consult (permissible substitution): Payer: Medicare Other | Admitting: Neurology

## 2016-07-05 ENCOUNTER — Encounter (HOSPITAL_COMMUNITY): Payer: Medicare Other

## 2016-07-05 ENCOUNTER — Inpatient Hospital Stay (HOSPITAL_COMMUNITY): Payer: Medicare Other | Admitting: Physical Therapy

## 2016-07-05 ENCOUNTER — Inpatient Hospital Stay (HOSPITAL_COMMUNITY): Payer: Medicare Other | Admitting: Occupational Therapy

## 2016-07-05 DIAGNOSIS — E1165 Type 2 diabetes mellitus with hyperglycemia: Secondary | ICD-10-CM

## 2016-07-05 DIAGNOSIS — G8250 Quadriplegia, unspecified: Secondary | ICD-10-CM

## 2016-07-05 DIAGNOSIS — D62 Acute posthemorrhagic anemia: Secondary | ICD-10-CM

## 2016-07-05 DIAGNOSIS — K592 Neurogenic bowel, not elsewhere classified: Secondary | ICD-10-CM

## 2016-07-05 DIAGNOSIS — S14129S Central cord syndrome at unspecified level of cervical spinal cord, sequela: Secondary | ICD-10-CM

## 2016-07-05 LAB — CBC WITH DIFFERENTIAL/PLATELET
BAND NEUTROPHILS: 0 %
Basophils Absolute: 0 10*3/uL (ref 0.0–0.1)
Basophils Relative: 0 %
Blasts: 0 %
Eosinophils Absolute: 0 10*3/uL (ref 0.0–0.7)
Eosinophils Relative: 0 %
HCT: 34.8 % — ABNORMAL LOW (ref 39.0–52.0)
Hemoglobin: 11.9 g/dL — ABNORMAL LOW (ref 13.0–17.0)
LYMPHS ABS: 0 10*3/uL — AB (ref 0.7–4.0)
LYMPHS PCT: 0 %
MCH: 29.9 pg (ref 26.0–34.0)
MCHC: 34.2 g/dL (ref 30.0–36.0)
MCV: 87.4 fL (ref 78.0–100.0)
METAMYELOCYTES PCT: 0 %
MONO ABS: 0 10*3/uL — AB (ref 0.1–1.0)
Monocytes Relative: 0 %
Myelocytes: 0 %
Neutro Abs: 0 10*3/uL — ABNORMAL LOW (ref 1.7–7.7)
Neutrophils Relative %: 0 %
PLATELETS: 300 10*3/uL (ref 150–400)
Promyelocytes Absolute: 0 %
RBC: 3.98 MIL/uL — ABNORMAL LOW (ref 4.22–5.81)
RDW: 13.1 % (ref 11.5–15.5)
WBC: 12.1 10*3/uL — ABNORMAL HIGH (ref 4.0–10.5)

## 2016-07-05 LAB — COMPREHENSIVE METABOLIC PANEL
ALBUMIN: 2.8 g/dL — AB (ref 3.5–5.0)
ALT: 44 U/L (ref 17–63)
AST: 37 U/L (ref 15–41)
Alkaline Phosphatase: 105 U/L (ref 38–126)
Anion gap: 9 (ref 5–15)
BUN: 18 mg/dL (ref 6–20)
CHLORIDE: 99 mmol/L — AB (ref 101–111)
CO2: 25 mmol/L (ref 22–32)
Calcium: 9 mg/dL (ref 8.9–10.3)
Creatinine, Ser: 1.04 mg/dL (ref 0.61–1.24)
GFR calc Af Amer: >60 mL/min (ref >60)
GFR calc non Af Amer: >60 mL/min (ref >60)
Glucose, Bld: 174 mg/dL — ABNORMAL HIGH (ref 65–99)
Potassium: 4 mmol/L (ref 3.5–5.1)
Sodium: 133 mmol/L — ABNORMAL LOW (ref 135–145)
Total Bilirubin: 0.7 mg/dL (ref 0.3–1.2)
Total Protein: 6.7 g/dL (ref 6.5–8.1)

## 2016-07-05 LAB — GLUCOSE, CAPILLARY
GLUCOSE-CAPILLARY: 141 mg/dL — AB (ref 65–99)
GLUCOSE-CAPILLARY: 146 mg/dL — AB (ref 65–99)
GLUCOSE-CAPILLARY: 218 mg/dL — AB (ref 65–99)
Glucose-Capillary: 163 mg/dL — ABNORMAL HIGH (ref 65–99)
Glucose-Capillary: 151 mg/dL — ABNORMAL HIGH (ref 65–99)

## 2016-07-05 MED ORDER — METFORMIN HCL 500 MG PO TABS
500.0000 mg | ORAL_TABLET | Freq: Two times a day (BID) | ORAL | Status: DC
  Administered 2016-07-05 – 2016-07-20 (×30): 500 mg via ORAL
  Filled 2016-07-05 (×30): qty 1

## 2016-07-05 MED ORDER — MAGNESIUM HYDROXIDE 400 MG/5ML PO SUSP
960.0000 mL | Freq: Once | ORAL | Status: AC
  Administered 2016-07-05: 960 mL via RECTAL
  Filled 2016-07-05: qty 240

## 2016-07-05 NOTE — Evaluation (Signed)
Physical Therapy Assessment and Plan  Patient Details  Name: Dwon Sky MRN: [id number] Date of Birth: 04/29/1950  PT Diagnosis: Abnormal posture, Abnormality of gait, Coordination disorder, Difficulty walking, Dizziness and giddiness, Muscle weakness, Pain in neck/shoulders and Quadriplegia Rehab Potential: Good ELOS: 3 weeks   Today's Date: 07/05/2016 PT Individual Time: 1300-1400 PT Individual Time Calculation (min): 60 min    Problem List:  Patient Active Problem List   Diagnosis Date Noted  . Abdominal distention   . Abdominal pain   . S/P cervical spinal fusion   . Type 2 diabetes mellitus with peripheral neuropathy (HCC)   . Hyperlipidemia   . Constipation due to pain medication   . Myelopathy (HCC)   . OSA on CPAP   . Surgery, elective   . History of CVA (cerebrovascular accident)   . Benign essential HTN   . History of cervical spinal surgery   . History of lumbar surgery   . Tobacco abuse   . Diastolic dysfunction   . Acute blood loss anemia   . Post-operative pain   . Tachycardia   . Leukocytosis   . Spinal stenosis of lumbar region   . Central cord syndrome (HCC)   . Acute hypoxemic respiratory failure (HCC)   . Spinal stenosis in cervical region 06/27/2016  . Cervical stenosis of spinal canal 06/25/2016  . Weakness of extremity 06/25/2016  . OSA (obstructive sleep apnea)   . Hypertension   . Diabetes (HCC)   . DYSPNEA ON EXERTION 10/06/2009  . Nonspecific (abnormal) findings on radiological and other examination of body structure 10/06/2009  . CT, CHEST, ABNORMAL 10/06/2009  . COLONIC POLYPS 10/05/2009  . DIABETES, TYPE 2 10/05/2009  . HYPERLIPIDEMIA 10/05/2009  . 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 Essential hypertension 10/05/2009  . CVA 10/05/2009  . 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 Arthritis   . Colon polyps   . CVA (cerebral infarction) 2010  . Diabetes mellitus   .  Dizziness   . ED (erectile dysfunction)   . Foot drop   . H/O Bell's palsy   . Hyperlipidemia   . Hypertension   . Obesity   . OSA on CPAP   . Spinal stenosis   . Stroke (HCC)   . Unsteady gait    Past Surgical History:  Past Surgical History:  Procedure Laterality Date  . CARPAL TUNNEL RELEASE     bilateral  . CERVICAL DISCECTOMY  2004  . LUMBAR DISC SURGERY     x6  . POSTERIOR CERVICAL FUSION/FORAMINOTOMY N/A 06/27/2016   Procedure: POSTERIOR CERVICAL FUSION C2-C5; C2-C4 Decompression;  Surgeon: Venita Lick, MD;  Location: [facility] OR;  Service: Orthopedics;  Laterality: N/A;    Assessment & Plan Clinical Impression: Marsel Gail a 66 y.o.right handed malewith history of diabetes mellitus, CVA 2010, hypertension, cervical discectomy 2004 and multiplelumbar disc surgery and tobacco abuse. Patient reports he was independent until approximately one month ago and then with multiple falls and now using a rolling walker for short distance ambulation. Present 06/26/2016 with progressive back and leg pain as well as multiple falls followed as an outpatient by Dr. Shon Baton. MRI reviewed, showing spinal stenosis C3-4 level as well as C2-3. Preoperative clearance echocardiogram with ejection fraction 65% grade 1 diastolic dysfunction. Underwent posterior cervical fusion C2-C5, C2-C4 decompression 06/26/2016 per Dr. Shon Baton. Patient did require short-term intubation until 06/28/2016. Acute blood loss anemia and was transfused. Hospital course pain management.  Hard cervical collar at all times.Previous lumbar MRI done December 2017 showed significant spinal stenosis L1-S1. Previous lumbar decompression L2-S1. Multiple level facet arthrosis contributing to lateral recess stenosis and continues to be followed by orthopedic services awaiting planned for further revision decompression of Lumbarspinal stenosisin approximately the next 2 weeks. Patient transferred to CIR on 07/04/2016 .   Patient currently  requires max with mobility secondary to muscle weakness and muscle paralysis, decreased cardiorespiratoy endurance, impaired timing and sequencing, unbalanced muscle activation and decreased coordination and decreased sitting balance, decreased standing balance, decreased postural control and decreased balance strategies.  Prior to hospitalization, patient was modified independent  with mobility and lived with Spouse in a House home.  Home access is   .  Patient will benefit from skilled PT intervention to maximize safe functional mobility, minimize fall risk and decrease caregiver burden for planned discharge home with 24 hour assist.  Anticipate patient will benefit from follow up OP at discharge.  PT - End of Session Activity Tolerance: Tolerates 30+ min activity with multiple rests Endurance Deficit: Yes Endurance Deficit Description: requires rest breaks after all short duration mobility tasks PT Assessment Rehab Potential (ACUTE/IP ONLY): Good PT Patient demonstrates impairments in the following area(s): Balance;Endurance;Motor;Pain;Safety;Sensory;Skin Integrity PT Transfers Functional Problem(s): Bed Mobility;Car;Bed to Chair;Furniture PT Locomotion Functional Problem(s): Ambulation;Wheelchair Mobility;Stairs PT Plan PT Intensity: Minimum of 1-2 x/day ,45 to 90 minutes PT Frequency: 5 out of 7 days PT Duration Estimated Length of Stay: 3 weeks PT Treatment/Interventions: Ambulation/gait training;Discharge planning;Disease management/prevention;Balance/vestibular training;DME/adaptive equipment instruction;Community reintegration;Functional electrical stimulation;Patient/family education;Stair training;UE/LE Coordination activities;UE/LE Strength taining/ROM;Splinting/orthotics;Pain management;Neuromuscular re-education;Skin care/wound management;Therapeutic Exercise;Wheelchair propulsion/positioning;Therapeutic Activities;Psychosocial support;Functional mobility training PT Transfers  Anticipated Outcome(s): S with LRAD PT Locomotion Anticipated Outcome(s): minA with LRAD PT Recommendation Recommendations for Other Services: Therapeutic Recreation consult Therapeutic Recreation Interventions: Kitchen group;Pet therapy;Outing/community reintergration Follow Up Recommendations: Outpatient PT Patient destination: Home Equipment Recommended: To be determined Equipment Details: has w/c  Skilled Therapeutic Intervention Pt received seated in w/c, c/o pain as below and agreeable to treatment. PT initial assessment performed and completed as described below with mod/maxA overall d/t significant strength/coordination deficits in BUEs/BLEs, pain. Performed sit <>stand in parallel bars with maxA initially, reduced to [place] with repetition. Standing tolerance of approximately 10 sec before LEs fatigued with RLE buckling and requiring maxA for eccentric control to sit. Educated pt in rehab process, estimated length of stay, falls prevention safety with staff assist to transfer to bed or bathroom; pt agreeable to all the above. Returned to bed at end of session maxA squat pivot. Remained supine in bed at end of session, wife present and all needs in reach.   PT Evaluation Precautions/Restrictions Precautions Precautions: Cervical;Fall Required Braces or Orthoses: Cervical Brace Cervical Brace: Hard collar;At all times General Chart Reviewed: Yes Response to Previous Treatment: Patient reporting fatigue but able to participate. Family/Caregiver Present: Yes (wife arrived at end of session)  Vital SignsTherapy Vitals Temp: 98.3 F (36.8 C) Temp Source: Oral Pulse Rate: 83 Resp: 18 BP: 110/69 Patient Position (if appropriate): Lying Oxygen Therapy SpO2: 100 % O2 Device: Not Delivered Pain Pain Assessment Pain Assessment: No/denies pain Pain Score: 4  Pain Type: Acute pain Pain Location: Neck Pain Descriptors / Indicators: Aching Patients Stated Pain Goal: 2 Pain  Intervention(s): Medication (See eMAR) Home Living/Prior Functioning Home Living Available Help at Discharge: Family;Available 24 hours/day Type of Home: House  Lives With: Spouse Prior Function Vocation: Retired Vision/Perception    WFL Cognition Overall Cognitive Status: Within Functional Limits for  tasks assessed Sensation Sensation Light Touch: Impaired by gross assessment Light Touch Impaired Details: Impaired RUE;Impaired LUE Hot/Cold: Impaired by gross assessment Proprioception: Impaired by gross assessment Coordination Gross Motor Movements are Fluid and Coordinated: No Fine Motor Movements are Fluid and Coordinated: No Coordination and Movement Description: decr [place] Motor  Motor Motor: Tetraplegia  Mobility Transfers Sit to Stand: 2: Max assist Stand to Sit: 2: Max assist Locomotion  Ambulation Ambulation: No Gait Gait: No Stairs / Additional Locomotion Stairs: No Architect: Yes Wheelchair Assistance: 3: Building surveyor Details: Verbal cues for technique;Verbal cues for Information systems manager: Both lower extermities Wheelchair Parts Management: Needs assistance  Trunk/Postural Assessment  Cervical Assessment Cervical Assessment:  (restricted ROM due to c collar) Thoracic Assessment Thoracic Assessment: Within Functional Limits Lumbar Assessment Lumbar Assessment:  (posterior pelvic tilt) Postural Control Postural Control: Deficits on evaluation Trunk Control: statically  Righting Reactions: min A- for balance- posterior lean  Balance Balance Balance Assessed: Yes Dynamic Sitting Balance Sitting balance - Comments: sitting reaching to sides leaning some and able to return to upright Static Standing Balance Static Standing - Level of Assistance: 2: Max assist;1: +1 Total assist Extremity Assessment  RUE Assessment RUE Assessment: Exceptions to [facility] RUE Strength RUE Overall Strength:  Deficits RUE Overall Strength Comments: grossly 3+/5 LUE Assessment LUE Assessment: Exceptions to [facility] LUE Strength LUE Overall Strength: Deficits LUE Overall Strength Comments: overall 3+/5  RLE Assessment RLE Assessment: Exceptions to [facility] RLE Strength RLE Overall Strength: Deficits Right Hip Flexion: 2/5 Right Knee Flexion: 2/5 Right Knee Extension: 3-/5 Right Ankle Dorsiflexion: 1/5 Right Ankle Plantar Flexion: 2/5 LLE Assessment LLE Assessment: Exceptions to [facility] LLE Strength LLE Overall Strength: Deficits Left Hip Flexion: 4/5 Left Knee Flexion: 4-/5 Left Knee Extension: 4/5 Left Ankle Dorsiflexion: 4-/5 Left Ankle Plantar Flexion: 4-/5   See Function Navigator for Current Functional Status.   Refer to Care Plan for Long Term Goals  Recommendations for other services: Neuropsych and Therapeutic Recreation  Pet therapy, Stress management and Outing/community reintegration  Discharge Criteria: Patient will be discharged from PT if patient refuses treatment 3 consecutive times without medical reason, if treatment goals not met, if there is a change in medical status, if patient makes no progress towards goals or if patient is discharged from hospital.  The above assessment, treatment plan, treatment alternatives and goals were discussed and mutually agreed upon: by patient and by family  [name] 07/05/2016, 2:06 PM

## 2016-07-05 NOTE — Evaluation (Signed)
Occupational Therapy Assessment and Plan  Patient Details  Name: Rodney Crane MRN: [id number] Date of Birth: 30-Nov-1950  OT Diagnosis: acute pain and paraparesis at level C2-5 Rehab Potential: Rehab Potential (ACUTE ONLY): Good ELOS: ~21-28 days    Today's Date: 07/05/2016 OT Individual Time: 6195-0932 OT Individual Time Calculation (min): 60 min     Problem List:  Patient Active Problem List   Diagnosis Date Noted  . Abdominal distention   . Abdominal pain   . S/P cervical spinal fusion   . Type 2 diabetes mellitus with peripheral neuropathy (HCC)   . Hyperlipidemia   . Constipation due to pain medication   . Myelopathy ([place])   . OSA on CPAP   . Surgery, elective   . History of CVA (cerebrovascular accident)   . Benign essential HTN   . History of cervical spinal surgery   . History of lumbar surgery   . Tobacco abuse   . Diastolic dysfunction   . Acute blood loss anemia   . Post-operative pain   . Tachycardia   . Leukocytosis   . Spinal stenosis of lumbar region   . Central cord syndrome ([place])   . Acute hypoxemic respiratory failure ([place])   . Spinal stenosis in cervical region 06/27/2016  . Cervical stenosis of spinal canal 06/25/2016  . Weakness of extremity 06/25/2016  . OSA (obstructive sleep apnea)   . Hypertension   . Diabetes ([place])   . DYSPNEA ON EXERTION 10/06/2009  . Nonspecific (abnormal) findings on radiological and other examination of body structure 10/06/2009  . CT, CHEST, ABNORMAL 10/06/2009  . COLONIC POLYPS 10/05/2009  . DIABETES, TYPE 2 10/05/2009  . HYPERLIPIDEMIA 10/05/2009  . 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 Essential hypertension 10/05/2009  . CVA 10/05/2009  . 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 Arthritis   . Colon polyps   . CVA (cerebral infarction) 2010  . Diabetes mellitus   . Dizziness   . ED (erectile dysfunction)   . Foot drop   . H/O Bell's palsy   .  Hyperlipidemia   . Hypertension   . Obesity   . OSA on CPAP   . Spinal stenosis   . Stroke ([place])   . Unsteady gait    Past Surgical History:  Past Surgical History:  Procedure Laterality Date  . CARPAL TUNNEL RELEASE     bilateral  . CERVICAL DISCECTOMY  2004  . LUMBAR DISC SURGERY     x6  . POSTERIOR CERVICAL FUSION/FORAMINOTOMY N/A 06/27/2016   Procedure: POSTERIOR CERVICAL FUSION C2-C5; C2-C4 Decompression;  Surgeon: Melina Schools, MD;  Location: [place];  Service: Orthopedics;  Laterality: N/A;    Assessment & Plan Clinical Impression: Patient is a 66 y.o. year old male right handed malewith history of diabetes mellitus, CVA 2010, hypertension, cervical discectomy 2004 and multiple lumbar disc surgery and tobacco abuse. Per chart review and patient,patient lives with spouse. One level home. Patient reports he was independent until approximately one month ago and then with multiple falls and now using a rolling walker for short distance ambulation. Present 06/26/2016 with progressive back and leg pain as well as multiple falls followed as an outpatient by Dr. Rolena Infante. MRI reviewed, showing spinal stenosis C3-4 level as well as C2-3. Preoperative clearance echocardiogram with ejection fraction 67% grade 1 diastolic dysfunction. Underwent posterior cervical fusion C2-C5, C2-C4 decompression 06/26/2016 per Dr. Rolena Infante. Patient did require short-term intubation until 06/28/2016. Acute  blood loss anemia and was transfused. Hospital course pain management. Hard cervical collar at all times.Previous lumbar MRI done December 2017 showed significant spinal stenosis L1-S1. Previous lumbar decompression L2-S1. Multiple level facet arthrosis contributing to lateral recess stenosis and continues to be followed by orthopedic services awaiting planned for further revision decompression of Lumbar spinal stenosis in approximately the next 2 weeks. Physical and occupational therapy evaluation completed 06/28/2016  with recommendations of physical medicine rehabilitation consult. KUB 3/12 reviewed, showing significant stool burden Patient transferred to CIR on 07/04/2016 .    Patient currently requires max to total A with basic self-care skills and basic mobility  secondary to muscle weakness, decreased cardiorespiratoy endurance, impaired timing and sequencing, abnormal tone, unbalanced muscle activation, decreased coordination and decreased motor planning, acute pain and decreased sitting balance, decreased standing balance, decreased postural control, decreased balance strategies and difficulty maintaining precautions.  Prior to hospitalization, patient could complete ADL with modified independent .  Patient will benefit from skilled intervention to decrease level of assist with basic self-care skills and increase independence with basic self-care skills prior to discharge home with care partner.  Anticipate patient will require intermittent supervision and minimal physical assistance and follow up home health.  OT - End of Session Activity Tolerance: Tolerates 30+ min activity with multiple rests Endurance Deficit: Yes OT Assessment Rehab Potential (ACUTE ONLY): Good Barriers to Discharge: Inaccessible home environment OT Patient demonstrates impairments in the following area(s): Balance;Safety;Sensory;Edema;Skin Integrity;Endurance;Motor;Nutrition;Pain OT Basic ADL's Functional Problem(s): Eating;Grooming;Bathing;Dressing;Toileting OT Transfers Functional Problem(s): Toilet;Tub/Shower OT Additional Impairment(s): Fuctional Use of Upper Extremity OT Plan OT Intensity: Minimum of 1-2 x/day, 45 to 90 minutes OT Frequency: 5 out of 7 days OT Duration/Estimated Length of Stay: ~21-28 days  OT Treatment/Interventions: Balance/vestibular training;Discharge planning;Functional electrical stimulation;Pain management;Self Care/advanced ADL retraining;Therapeutic Activities;UE/LE Coordination  activities;Therapeutic Exercise;Skin care/wound managment;Patient/family education;Functional mobility training;Disease mangement/prevention;Community reintegration;DME/adaptive equipment instruction;Neuromuscular re-education;Psychosocial support;Splinting/orthotics;UE/LE Strength taining/ROM;Wheelchair propulsion/positioning OT Self Feeding Anticipated Outcome(s): supervision  OT Basic Self-Care Anticipated Outcome(s): supervision OT Toileting Anticipated Outcome(s): supervision OT Bathroom Transfers Anticipated Outcome(s): supervision  OT Recommendation Patient destination: Home Follow Up Recommendations: Home health OT;Outpatient OT Equipment Recommended: To be determined   Skilled Therapeutic Intervention 1:1 OT eval initiated with OT purpose, goals and role discussed. Self care retraining including bathing and transfer training. Lower legs bathed in bed and TEDS donned for BP controlled.  BP was [facility] with positional changes 124/65  Then 134/59. Pt performed bed mobility with max A with bed rails for peri hygiene and to don a brief. Pt required max A to come to EOB. At EOB perform UB bathing. Sit to stand from elevated bed with max A- pt with difficulty with initial bottom clearance requiring max A but able to perform terminal extension with extra time and cues. Pt returned to sitting and transferred squat pivot to w/c towards his left with max A with 3 squats with instructional cues. Pt performed tooth brushing with setup. Trial of use of STEDY with max A +1 for sit to stand and recommend this transfer for nursing at this time for toilet transfers.  Left in tilt in space w/c to work towards out of bed tolerance.  Wife present at beginning of session but left during session to go home and get clothes.   OT Evaluation Precautions/Restrictions  Precautions Precautions: Cervical;Fall Required Braces or Orthoses: Cervical Brace Cervical Brace: Hard collar;At all times General Chart Reviewed:  No Family/Caregiver Present: Yes Vital Signs Therapy Vitals Temp: 98.3 F (36.8 C) Temp Source: Oral  Pulse Rate: 83 Resp: 18 BP: 110/69 Patient Position (if appropriate): Lying Oxygen Therapy SpO2: 100 % O2 Device: Not Delivered Pain Pain Assessment Pain Score: 4  Pain Type: Acute pain Pain Location: Neck Pain Descriptors / Indicators: Aching Patients Stated Pain Goal: 2 Pain Intervention(s): Medication (See eMAR) Home Living/Prior Functioning Home Living Available Help at Discharge: Family, Available 24 hours/day Type of Home: House Home Access: Stairs to enter [employer] of Steps: 1/2 step (porch stoop) Home Layout: One level Bathroom Shower/Tub: Tub/shower unit, Architectural technologist: Programmer, systems: Yes  Lives With: Spouse Prior Function Vocation: Retired ADL ADL ADL Comments: see functional navigator Vision/Perception  Vision- History Baseline Vision/History: No visual deficits Patient Visual Report: No change from baseline Vision- Assessment Vision Assessment?: Yes  Cognition Overall Cognitive Status: Within Functional Limits for tasks assessed Arousal/Alertness: Awake/alert Orientation Level: Person;Place;Situation Person: Oriented Place: Oriented Situation: Oriented Year: 2018 Month: March Day of Week: Correct Memory: Appears intact Immediate Memory Recall: Sock;Blue;Bed Memory Recall: Sock;Bed;Blue Memory Recall Sock: Without Cue Memory Recall Blue: Without Cue Memory Recall Bed: Without Cue Attention: Sustained Sustained Attention: Appears intact Awareness: Appears intact Problem Solving: Appears intact Safety/Judgment: Appears intact Sensation Sensation Light Touch: Impaired by gross assessment Light Touch Impaired Details: Impaired RUE;Impaired LUE Hot/Cold: Impaired by gross assessment Proprioception: Impaired by gross assessment Coordination Gross Motor Movements are Fluid and Coordinated: No Fine  Motor Movements are Fluid and Coordinated: No Coordination and Movement Description: decr [place] Motor  Motor Motor: Tetraplegia Mobility  Transfers Transfers: Sit to Stand;Stand to Sit Sit to Stand: 2: Max assist Stand to Sit: 2: Max assist  Trunk/Postural Assessment  Cervical Assessment Cervical Assessment:  (restricted ROM due to c collar) Thoracic Assessment Thoracic Assessment: Within Functional Limits Lumbar Assessment Lumbar Assessment:  (posterior pelvic tilt) Postural Control Postural Control: Deficits on evaluation Trunk Control: statically  Righting Reactions: min A- for balance- posterior lean  Balance Balance Balance Assessed: Yes Dynamic Sitting Balance Sitting balance - Comments: sitting reaching to sides leaning some and able to return to upright Static Standing Balance Static Standing - Level of Assistance: 2: Max assist;1: +1 Total assist Extremity/Trunk Assessment RUE Assessment RUE Assessment: Exceptions to [facility] RUE Strength RUE Overall Strength: Deficits RUE Overall Strength Comments: grossly 3+/5 LUE Assessment LUE Assessment: Exceptions to [facility] LUE Strength LUE Overall Strength: Deficits LUE Overall Strength Comments: overall 3+/5    See Function Navigator for Current Functional Status.   Refer to Care Plan for Long Term Goals  Recommendations for other services: Therapeutic Recreation  Stress management and Outing/community reintegration   Discharge Criteria: Patient will be discharged from OT if patient refuses treatment 3 consecutive times without medical reason, if treatment goals not met, if there is a change in medical status, if patient makes no progress towards goals or if patient is discharged from hospital.  The above assessment, treatment plan, treatment alternatives and goals were discussed and mutually agreed upon: by patient and by family  Nicoletta Ba 07/05/2016, 3:58 PM

## 2016-07-05 NOTE — Evaluation (Signed)
Speech Language Pathology Assessment and Plan  Patient Details  Name: Rodney Crane MRN: [id number] Date of Birth: [birth year]  SLP Diagnosis:  (None: questionable mild dysphagia; however able to compensate ) Rehab Potential:Defer to OT/PT ELOS: Defer to OT/PT   Today's Date: 07/05/2016 SLP Individual Time: 1035-1130 SLP Individual Time Calculation (min): 55 min   Problem List:  Patient Active Problem List   Diagnosis Date Noted  . Abdominal distention   . Abdominal pain   . S/P cervical spinal fusion   . Type 2 diabetes mellitus with peripheral neuropathy (HCC)   . Hyperlipidemia   . Constipation due to pain medication   . Myelopathy ([place])   . OSA on CPAP   . Surgery, elective   . History of CVA (cerebrovascular accident)   . Benign essential HTN   . History of cervical spinal surgery   . History of lumbar surgery   . Tobacco abuse   . Diastolic dysfunction   . Acute blood loss anemia   . Post-operative pain   . Tachycardia   . Leukocytosis   . Spinal stenosis of lumbar region   . Central cord syndrome ([place])   . Acute hypoxemic respiratory failure ([place])   . Spinal stenosis in cervical region 06/27/2016  . Cervical stenosis of spinal canal 06/25/2016  . Weakness of extremity 06/25/2016  . OSA (obstructive sleep apnea)   . Hypertension   . Diabetes ([place])   . DYSPNEA ON EXERTION 10/06/2009  . Nonspecific (abnormal) findings on radiological and other examination of body structure 10/06/2009  . CT, CHEST, ABNORMAL 10/06/2009  . COLONIC POLYPS 10/05/2009  . DIABETES, TYPE 2 10/05/2009  . HYPERLIPIDEMIA 10/05/2009  . 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 Essential hypertension 10/05/2009  . CVA 10/05/2009  . 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 Arthritis   . Colon polyps   . CVA (cerebral infarction) 2010  . Diabetes mellitus   . Dizziness   . ED (erectile dysfunction)   . Foot drop   . H/O Bell's  palsy   . Hyperlipidemia   . Hypertension   . Obesity   . OSA on CPAP   . Spinal stenosis   . Stroke ([place])   . Unsteady gait    Past Surgical History:  Past Surgical History:  Procedure Laterality Date  . CARPAL TUNNEL RELEASE     bilateral  . CERVICAL DISCECTOMY  2004  . LUMBAR DISC SURGERY     x6  . POSTERIOR CERVICAL FUSION/FORAMINOTOMY N/A 06/27/2016   Procedure: POSTERIOR CERVICAL FUSION C2-C5; C2-C4 Decompression;  Surgeon: Melina Schools, MD;  Location: [facility];  Service: Orthopedics;  Laterality: N/A;    Assessment / Plan / Recommendation Clinical Impression Rodney Crane a 66 y.o.right handed malewith history of diabetes mellitus, CVA 2010, hypertension, cervical discectomy 2004 and multiple lumbar disc surgery and tobacco abuse. Per chart review and patient,patient lives with spouse. One level home. Patient reports he was independent until approximately one month ago and then with multiple falls and now using a rolling walker for short distance ambulation. Present 06/26/2016 with progressive back and leg pain as well as multiple falls followed as an outpatient by Dr. Rolena Infante. MRI reviewed, showing spinal stenosis C3-4 level as well as C2-3. Preoperative clearance echocardiogram with ejection fraction 14% grade 1 diastolic dysfunction. Underwent posterior cervical fusion C2-C5, C2-C4 decompression 06/26/2016 per Dr. Rolena Infante. Patient did require short-term intubation until 06/28/2016. Acute blood loss anemia and  was transfused. Hospital course pain management. Hard cervical collar at all times.Previous lumbar MRI done December 2017 showed significant spinal stenosis L1-S1. Previous lumbar decompression L2-S1. Multiple level facet arthrosis contributing to lateral recess stenosis and continues to be followed by orthopedic services awaiting planned for further revision decompression of Lumbar spinal stenosis in approximately the next 2 weeks. Physical and occupational therapy evaluation  completed 06/28/2016 with recommendations of physical medicine rehabilitation consult. KUB 3/12 reviewed, showing significant stool burden. Patient was admitted for a comprehensive rehabilitation program.    Orders received for Bedside Swallow Evaluation.  Patient consumed puree and thin liquids with no overt s/s of aspiration.  Advanced trials of regular textures resulted in subtle throat clear x1 and delayed cough; however, with brief education about portion control, pacing, and alternating solids and liquids patient agrees that he does not need skilled SLP follow-up.  Patient with hard cough and anticipate he is able to protect airway adequately.  As a result, agree with no need for skilled SLP follow-up.     Skilled Therapeutic Interventions          Results and recommendations reviewed with patient.    SLP Assessment  Patient will need skilled Speech Lanaguage Pathology Services during CIR admission    Recommendations  SLP Diet Recommendations: Age appropriate regular solids;Thin Liquid Administration via: Cup;Straw Medication Administration: Whole meds with liquid Supervision: Patient able to self feed (Set-up assist ) Compensations: Slow rate;Small sips/bites;Follow solids with liquid Postural Changes and/or Swallow Maneuvers: Out of bed for meals;Seated upright 90 degrees Oral Care Recommendations: Oral care BID Patient destination: Home Follow up Recommendations: None Equipment Recommended: None recommended by SLP         SLP Duration   Defer to OT/PT          Pain Pain Assessment Pain Assessment: No/denies pain  Prior Functioning Cognitive/Linguistic Baseline: Within functional limits Type of Home: House  Lives With: Spouse Available Help at Discharge: Family;Available 24 hours/day Vocation: Retired  Function:  Eating Eating   Modified Consistency Diet: No (trials with SLP) Eating Assist Level: Set up assist for;Supervision or verbal cues   Eating Set Up  Assist For: Opening containers;Cutting food       Cognition Comprehension Comprehension assist level: Follows complex conversation/direction with no assist  Expression   Expression assist level: Expresses complex ideas: With no assist  Social Interaction Social Interaction assist level: Interacts appropriately with others - No medications needed.  Problem Solving Problem solving assist level: Solves complex problems: Recognizes & self-corrects  Memory Memory assist level: Complete Independence: No helper    Recommendations for other services: None   Discharge Criteria: Patient will be discharged from SLP if patient refuses treatment 3 consecutive times without medical reason, if treatment goals not met, if there is a change in medical status, if patient makes no progress towards goals or if patient is discharged from hospital.  The above assessment, treatment plan, treatment alternatives and goals were discussed and mutually agreed upon: by patient  Gunnar Fusi, M.A., CCC-SLP [phone]  [place] 07/05/2016, 11:57 AM

## 2016-07-05 NOTE — Consult Note (Signed)
            [facility] CM Primary Care Navigator  07/05/2016  Rodney Crane November 17, 1950 [id number]   Went to see patient at the bedside earlier today to identify possible discharge needs but staff reports that he was discharged to [facility] (C[facility]) for further rehabilitation.  For additional questions please contact:  Edwena Felty A. Melaine Mcphee, BSN, RN-BC [facility] PRIMARY CARE Navigator Cell: [phone]

## 2016-07-05 NOTE — Progress Notes (Signed)
SMOG enema given with pt having a very large loose BM. One incontinent episode with a large continent BM on toilet. Pt states he has relief and 'feels better.'

## 2016-07-05 NOTE — Progress Notes (Signed)
Patient information reviewed and entered into eRehab system by Swati Granberry, RN, CRRN, PPS Coordinator.  Information including medical coding and functional independence measure will be reviewed and updated through discharge.     Per nursing patient was given "Data Collection Information Summary for Patients in Inpatient Rehabilitation Facilities with attached "Privacy Act Statement-Health Care Records" upon admission.  

## 2016-07-05 NOTE — Progress Notes (Signed)
Patient refused CPAP for tonight.  Advised to have RT called should he change his mind. 

## 2016-07-05 NOTE — Progress Notes (Signed)
[facility] PHYSICAL MEDICINE & REHABILITATION     PROGRESS NOTE    Subjective/Complaints: Had a fair night. Was restless. Emptied bladder somewhat. Pain fairly well controlled. Ready for therapies  ROS: pt denies nausea, vomiting, diarrhea, cough, shortness of breath or chest pain   Objective: Vital Signs: Blood pressure 136/68, pulse 81, temperature 98.3 F (36.8 C), temperature source Oral, resp. rate 18, height 6\' 4"  (1.93 m), weight 122.3 kg (269 lb 9.6 oz), SpO2 99 %. Dg Abd Portable 1v  Result Date: 07/04/2016 CLINICAL DATA:  Abdominal pain and abdominal distention. Constipation. EXAM: PORTABLE ABDOMEN - 1 VIEW COMPARISON:  None. FINDINGS: There is extensive stool throughout the entire colon. No appreciable fecal impaction. No dilated loops of large or small bowel. No acute bone abnormality.  No visible free air or free fluid. IMPRESSION: Extensive stool throughout the colon.  A Electronically Signed   By: Lorriane Shire M.D.   On: 07/04/2016 10:48    Recent Labs  07/05/16 0529  WBC 12.1*  HGB 11.9*  HCT 34.8*  PLT 300    Recent Labs  07/05/16 0529  NA 133*  K 4.0  CL 99*  GLUCOSE 174*  BUN 18  CREATININE 1.04  CALCIUM 9.0   CBG (last 3)   Recent Labs  07/04/16 1712 07/05/16 0055 07/05/16 0638  GLUCAP 155* 218* 163*    Wt Readings from Last 3 Encounters:  07/04/16 122.3 kg (269 lb 9.6 oz)  07/02/16 133.7 kg (294 lb 11.2 oz)  06/23/16 130.2 kg (287 lb)    Physical Exam:  Constitutional: He is oriented to person, place, and time. He appears well-developed.  HENT:  Head: Normocephalic and atraumatic.  Eyes: Conjunctivae and EOM are normal. Left eye exhibits no discharge.  Neck:  Cervical collar in place. Posterior neck incision, clean and dry  Cardiovascular: RRR   Respiratory: CTA B  GI: Soft. Bowel sounds are normal.  Abdomen is mildly distended with + bowel sounds  Musculoskeletal: He exhibits tr edema.    Neurological: He is alert and  oriented to person, place, and time.  Motor: RUE: Shoulder abduction 4-/5, elbow flexion/extension 4/5, wrist/hand 4 to 4+/5 LUE: Shoulder abduction 4-/5, elbow flexion/extension 4/5, wrist/hand 4+/5 RLE: HF, KE 2 to 2+/5, ADF/PF 2- to 2/5 LLE: HF, KE 3+/5, ADF/PF 4/5 Sensation diminished to light touch in hands and feet DTRs symmetric   Skin: Skin is warm and dry.  Psychiatric: pleasant and cooperative.    Assessment/Plan: 1. 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 Physiatrist is providing close team supervision and 24 hour management of active medical problems listed below. Physiatrist and rehab team continue to assess barriers to discharge/monitor patient progress toward functional and medical goals.  Function:  Bathing Bathing position      Bathing parts      Bathing assist        Upper Body Dressing/Undressing Upper body dressing                    Upper body assist        Lower Body Dressing/Undressing Lower body dressing                                  Lower body assist        Toileting Toileting  Toileting assist     Transfers Chair/bed Clinical biochemist          Cognition Comprehension Comprehension assist level: Follows complex conversation/direction with no assist  Expression Expression assist level: Expresses complex ideas: With no assist  Social Interaction Social Interaction assist level: Interacts appropriately with others - No medications needed.  Problem Solving Problem solving assist level: Solves complex problems: Recognizes & self-corrects  Memory Memory assist level: Complete Independence: No helper   Medical Problem List and Plan: 1.  Decreased functional mobility secondary to central cord syndrome status post posterior  cervical fusion C2-C5, C2-C4 decompression 06/26/2016 as well as lumbar stenosis with planned surgical intervention  -beginning CIR therapies  -team conference today 2.  DVT Prophylaxis/Anticoagulation: SCDs. Check vascular study 3. Pain Management: Robaxin and oxycodone as needed 4. Mood: Provide emotional support 5. Neuropsych: This patient is capable of making decisions on his own behalf. 6. Skin/Wound Care: Routine skin checks 7. Fluids/Electrolytes/Nutrition: Routine I&O with follow-up chemistries reviewed personally today  -encourage PO 8. Acute blood loss anemia. Follow-up CBC reviewed. hgb stable 9. OSA. CPAP 10. Hypertension. HCTZ 12.5 mg daily, Toprol-XL 50 mg daily, Avapro 300 mg daily. Monitor with increased mobility 11. Diabetes mellitus with peripheral neuropathy. Hemoglobin A1c 6.0.SSI.  Check blood sugars before meals and at bedtime. Patient on Glucophage 1000 mg twice a day prior to admission. Resume at 500mg  today 12. History of CVA. Plavix currently on hold as awaiting plan further lumbar surgery eventually 13. Hyperlipidemia. Lipitor 14. Severe Constipation. Laxatives, SMOG enema today If no results . 15. Tobacco abuse: Counsel   LOS (Days) 1 A FACE TO FACE EVALUATION WAS PERFORMED  Alger Simons T, MD 07/05/2016 9:35 AM

## 2016-07-05 NOTE — Patient Care Conference (Signed)
Inpatient RehabilitationTeam Conference and Plan of Care Update Date: 07/05/2016   Time: 2:25 PM    Patient Name: Rodney Crane      Medical Record Number: [id number]  Date of Birth: [birth year] Sex: Male         Room/Bed: 4W07C/4W07C-01 Payor Info: Payor: MEDICARE / Plan: MEDICARE PART A AND B / Product Type: *No Product type* /    Admitting Diagnosis: Central Cord Syndrome  Admit Date/Time:  07/04/2016  4:43 PM Admission Comments: No comment available   Primary Diagnosis:  <principal problem not specified> Principal Problem: <principal problem not specified>  Patient Active Problem List   Diagnosis Date Noted  . Abdominal distention   . Abdominal pain   . S/P cervical spinal fusion   . Type 2 diabetes mellitus with peripheral neuropathy (HCC)   . Hyperlipidemia   . Constipation due to pain medication   . Myelopathy ([place])   . OSA on CPAP   . Surgery, elective   . History of CVA (cerebrovascular accident)   . Benign essential HTN   . History of cervical spinal surgery   . History of lumbar surgery   . Tobacco abuse   . Diastolic dysfunction   . Acute blood loss anemia   . Post-operative pain   . Tachycardia   . Leukocytosis   . Spinal stenosis of lumbar region   . Central cord syndrome ([place])   . Acute hypoxemic respiratory failure ([place])   . Spinal stenosis in cervical region 06/27/2016  . Cervical stenosis of spinal canal 06/25/2016  . Weakness of extremity 06/25/2016  . OSA (obstructive sleep apnea)   . Hypertension   . Diabetes ([place])   . DYSPNEA ON EXERTION 10/06/2009  . Nonspecific (abnormal) findings on radiological and other examination of body structure 10/06/2009  . CT, CHEST, ABNORMAL 10/06/2009  . COLONIC POLYPS 10/05/2009  . DIABETES, TYPE 2 10/05/2009  . HYPERLIPIDEMIA 10/05/2009  . 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 Essential hypertension 10/05/2009  . CVA 10/05/2009  . 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 Alger Simons Social Worker Present: Lennart Pall, LCSW Nurse Present: Heather Tessier, RN PT Present: Canary Brim, Harriet Pho, PT OT Present: Napoleon Form, OT SLP Present: Gunnar Fusi, SLP     Current Status/Progress Goal Weekly Team Focus  Medical   admitted for cervical myelopathy/stenosis. s/p decompression. also with lumbar disease. neurogenic bowel and bladder  improve functional mobility  initiate bowel program. pain control, SCI education   Bowel/Bladder   LBM 07/04/16 pt ; Colon [place] program q am/Scan q 6 hours I/I cath 6 hours as needed.   mod assist  continue to monitor q shift   Swallow/Nutrition/ Hydration   regular textures and thin liquids Mod I   N/A      ADL's   max to total A for bathing and dressing at bed mobility , max A squat pivot transfers  supervision to min A overall   transfer training, out of bed tolerance, sit to stands, manipulation of clothing, activity tolerance, pt/ family education    Mobility   modA bed mobility, maxA squat pivot and slide board transfers, mod A sit <>stand in parallel bars  S bed mobility, minA overall  transfer training, BLE/BUE NMR, activity tolerance, pt/family education   Communication             Safety/Cognition/ Behavioral Observations  no unsafe behavior  min  assist  monitor q shift   Pain   c/o pain to back /oxy IR 10 mg q4 hrs prn, Robaxin 500mg  q6hrs prn Rates pain 5-6    pain less than or equal to 2  monitor pain q shift   Skin   no skin breakdown/posterior neck incision with gauze/edema to BIL LE  No skin breakdown this admission  monitor skin q shift    Rehab Goals Patient on target to meet rehab goals: Yes *See Care Plan and progress notes for long and short-term goals.  Barriers to Discharge: ongoing weakness in all 4's, sensory loss    Possible Resolutions to Barriers:  continue NMR, pain mgt, family and patient education     Discharge Planning/Teaching Needs:  Home with wife who can provide supervision to light assistance.  Teaching to be planned closer to d/c.   Team Discussion:  New eval today.  Cervical surg done with lumbar surgery planned in a couple of weeks.  ?if having some b/b sensation - planning enema today.  Currently max - total assist overall with supervision - min assist goals.  Left side weaker than right.  ST saw for swallow but able to upgrade to reg/thin and will not plan to follow.  Revisions to Treatment Plan:  None   Continued Need for Acute Rehabilitation Level of Care: The patient requires daily medical management by a physician with specialized training in physical medicine and rehabilitation for the following conditions: Daily direction of a multidisciplinary physical rehabilitation program to ensure safe treatment while eliciting the highest outcome that is of practical value to the patient.: Yes Daily medical management of patient stability for increased activity during participation in an intensive rehabilitation regime.: Yes Daily analysis of laboratory values and/or radiology reports with any subsequent need for medication adjustment of medical intervention for : Neurological problems;Post surgical problems;Wound care problems  Teal Raben 07/05/2016, 5:37 PM

## 2016-07-05 NOTE — Progress Notes (Signed)
Pt admitted to unit at 1640 with wife at bedside. RN reviewed rehab process and schedule with pt and wife. RN educated on safety and protocol with verbal understanding.

## 2016-07-06 ENCOUNTER — Inpatient Hospital Stay (HOSPITAL_COMMUNITY): Payer: Medicare Other

## 2016-07-06 ENCOUNTER — Inpatient Hospital Stay (HOSPITAL_COMMUNITY): Payer: Medicare Other | Admitting: Physical Therapy

## 2016-07-06 ENCOUNTER — Inpatient Hospital Stay (HOSPITAL_COMMUNITY): Payer: Medicare Other | Admitting: Occupational Therapy

## 2016-07-06 DIAGNOSIS — M79609 Pain in unspecified limb: Secondary | ICD-10-CM

## 2016-07-06 LAB — GLUCOSE, CAPILLARY
GLUCOSE-CAPILLARY: 149 mg/dL — AB (ref 65–99)
GLUCOSE-CAPILLARY: 128 mg/dL — AB (ref 65–99)
Glucose-Capillary: 167 mg/dL — ABNORMAL HIGH (ref 65–99)
Glucose-Capillary: 93 mg/dL (ref 65–99)

## 2016-07-06 MED ORDER — ENOXAPARIN SODIUM 120 MG/0.8ML ~~LOC~~ SOLN
1.0000 mg/kg | Freq: Two times a day (BID) | SUBCUTANEOUS | Status: DC
  Administered 2016-07-06 – 2016-07-16 (×20): 115 mg via SUBCUTANEOUS
  Filled 2016-07-06 (×21): qty 0.78

## 2016-07-06 MED ORDER — BISACODYL 10 MG RE SUPP
10.0000 mg | Freq: Every day | RECTAL | Status: DC
  Administered 2016-07-07: 10 mg via RECTAL
  Filled 2016-07-06 (×2): qty 1

## 2016-07-06 NOTE — Progress Notes (Signed)
Occupational Therapy Session Note  Patient Details  Name: Rodney Crane MRN: [id number] Date of Birth: 21-Jun-1950  Today's Date: 07/06/2016 OT Individual Time: 8757-9728 OT Individual Time Calculation (min): 38 min    Short Term Goals: Week 1:  OT Short Term Goal 1 (Week 1): Pt will sit unsupported to bathe and dress UB with close supervision OT Short Term Goal 2 (Week 1): Pt will transfer bed to w/c with mod A +1 in prep for ADL tasks  OT Short Term Goal 3 (Week 1): Pt will perform sit to stand for clothing management with mod A  OT Short Term Goal 4 (Week 1): Pt will perform bed mobility with mod A with bed rails in prep for ADL task  Skilled Therapeutic Interventions/Progress Updates:    Upon entering the room, pt seated in wheelchair with no c/o pain this session. OT session focused on [facility] tasks with use of B UEs. Pt reports engaging in card activity routinely for leisure. Therefore, pt focused on obtaining cards from L hand with R and flipping over onto table. Pt required multiple attempts to obtain card but able to do with increased time and encouragement. OT also demonstrated isolating finger movements and rapid alternating movement exercises. Pt returning demonstrations with min verbal cues for proper technique. Pt requested toileting as he is due for I and O cath. RN present in room to assist pt as well. Stand from wheelchair with max A secondary to fatigue into STEDY. Pt required lifting and lowering assist from STEDY onto elevated toilet seat. RN remaining in the room as OT exited.   Therapy Documentation Precautions:  Precautions Precautions: Cervical, Fall Required Braces or Orthoses: Cervical Brace Cervical Brace: Hard collar, At all times General:   Vital Signs: Therapy Vitals Temp: 97.5 F (36.4 C) Temp Source: Oral Pulse Rate: 84 Resp: 18 BP: 104/64 Patient Position (if appropriate): Sitting Oxygen Therapy SpO2: 100 % O2 Device: Not Delivered Pain: Pain  Assessment Pain Assessment: No/denies pain Pain Score: 1  Pain Type: Acute pain Pain Location: Neck Pain Descriptors / Indicators: Aching Pain Intervention(s): Medication (See eMAR) ADL: ADL ADL Comments: see functional navigator Exercises:   Other Treatments:    See Function Navigator for Current Functional Status.   Therapy/Group: Individual Therapy  Gypsy Decant 07/06/2016, 4:13 PM

## 2016-07-06 NOTE — Progress Notes (Signed)
[place] PHYSICAL MEDICINE & REHABILITATION     PROGRESS NOTE    Subjective/Complaints: Slept fairly well. Had "long" day with therapy yesterday. Was tired in afternoon.   ROS: pt denies nausea, vomiting, diarrhea, cough, shortness of breath or chest pain  Objective: Vital Signs: Blood pressure 135/67, pulse 82, temperature 97.4 F (36.3 C), temperature source Oral, resp. rate 18, height 6\' 4"  (1.93 m), weight 117 kg (258 lb), SpO2 99 %. Dg Abd Portable 1v  Result Date: 07/04/2016 CLINICAL DATA:  Abdominal pain and abdominal distention. Constipation. EXAM: PORTABLE ABDOMEN - 1 VIEW COMPARISON:  None. FINDINGS: There is extensive stool throughout the entire colon. No appreciable fecal impaction. No dilated loops of large or small bowel. No acute bone abnormality.  No visible free air or free fluid. IMPRESSION: Extensive stool throughout the colon.  A Electronically Signed   By: Lorriane Shire M.D.   On: 07/04/2016 10:48    Recent Labs  07/05/16 0529  WBC 12.1*  HGB 11.9*  HCT 34.8*  PLT 300    Recent Labs  07/05/16 0529  NA 133*  K 4.0  CL 99*  GLUCOSE 174*  BUN 18  CREATININE 1.04  CALCIUM 9.0   CBG (last 3)   Recent Labs  07/05/16 1645 07/05/16 2031 07/06/16 0647  GLUCAP 141* 151* 149*    Wt Readings from Last 3 Encounters:  07/06/16 117 kg (258 lb)  07/02/16 133.7 kg (294 lb 11.2 oz)  06/23/16 130.2 kg (287 lb)    Physical Exam:  Constitutional: He is oriented to person, place, and time. He appears well-developed.  HENT:  Head: Normocephalic and atraumatic.  Eyes: Conjunctivae and EOM are normal. Left eye exhibits no discharge.  Neck:  Cervical collar in place. Posterior neck incision, remains clean and dry  Cardiovascular: RRR  Respiratory: clear bilaterally  GI: Soft. Bowel sounds are normal.  Abdomen is mildly distended with + bowel sounds  Musculoskeletal: He exhibits tr edema in upper and lower ext Neurological: He is alert and oriented to  person, place, and time.  Motor: RUE: Shoulder abduction 4-/5, elbow flexion/extension 4/5, wrist/hand 4 to 4+/5 LUE: Shoulder abduction 4-/5, elbow flexion/extension 4/5, wrist/hand 4+/5 RLE: HF, KE 2 to 2+/5, ADF/PF 2- to 2/5 LLE: HF, KE 3+/5, ADF/PF 4/5 Sensation diminished to light touch in hands and feet--motor and sensory exam unchanged DTRs symmetric   Skin: Skin is warm and dry.  Psychiatric: pleasant and cooperative.    Assessment/Plan: 1. 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 Physiatrist is providing close team supervision and 24 hour management of active medical problems listed below. Physiatrist and rehab team continue to assess barriers to discharge/monitor patient progress toward functional and medical goals.  Function:  Bathing Bathing position   Position: Sitting EOB (EOB/ bed/ w/c)  Bathing parts Body parts bathed by patient: Right arm, Left arm, Chest Body parts bathed by helper: Abdomen, Front perineal area, Buttocks, Left upper leg, Right upper leg, Right lower leg, Left lower leg, Back  Bathing assist Assist Level: Touching or steadying assistance(Pt > 75%)      Upper Body Dressing/Undressing Upper body dressing                    Upper body assist Assist Level: Touching or steadying assistance(Pt > 75%)      Lower Body Dressing/Undressing Lower body dressing   What is the patient  wearing?: Non-skid slipper socks, [place] - Performed by helper: Thread/unthread right underwear leg, Thread/unthread left underwear leg, Pull underwear up/down       Non-skid slipper socks- Performed by helper: Don/doff right sock, Don/doff left sock               TED Hose - Performed by helper: Don/doff right TED hose, Don/doff left TED hose  Lower body assist Assist for lower body dressing: Touching  or steadying assistance (Pt > 75%)      Toileting Toileting     Toileting steps completed by helper: Adjust clothing prior to toileting, Performs perineal hygiene, Adjust clothing after toileting Toileting Assistive Devices: Other (comment) (steady)  Toileting assist Assist level: Two helpers   Transfers Chair/bed transfer   Chair/bed transfer method: Squat pivot Chair/bed transfer assist level: Maximal assist (Pt 25 - 49%/lift and lower) Chair/bed transfer assistive device: Armrests     Locomotion Ambulation Ambulation activity did not occur: Safety/medical concerns         Wheelchair   Type: Manual Max wheelchair distance: 50 Assist Level: Moderate assistance (Pt 50 - 74%)  Cognition Comprehension Comprehension assist level: Follows complex conversation/direction with no assist  Expression Expression assist level: Expresses complex ideas: With no assist  Social Interaction Social Interaction assist level: Interacts appropriately with others - No medications needed.  Problem Solving Problem solving assist level: Solves complex problems: Recognizes & self-corrects  Memory Memory assist level: Complete Independence: No helper   Medical Problem List and Plan: 1.  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  DVT Prophylaxis/Anticoagulation: SCDs. Vascular study pending 3. Pain Management: Robaxin and oxycodone as needed 4. Mood: Provide emotional support 5. Neuropsych: This patient is capable of making decisions on his own behalf. 6. Skin/Wound Care: Routine skin checks 7. Fluids/Electrolytes/Nutrition:    -encourage PO 8. Acute blood loss anemia.  hgb stable 9. OSA. CPAP 10. Hypertension. HCTZ 12.5 mg daily, Toprol-XL 50 mg daily, Avapro 300 mg daily. Monitor with  increased mobility 11. Diabetes mellitus with peripheral neuropathy. Hemoglobin A1c 6.0.SSI.  Check blood sugars before meals and at bedtime. Patient on Glucophage 1000 mg twice a day prior to admission. Resumed glucophage at 500mg  bid  -changed diet to carb mod.   -sugars iunder better control 12. History of CVA. Plavix currently on hold as awaiting plan further lumbar surgery eventually 13. Hyperlipidemia. Lipitor 14. Severe Constipation. Large loose bm yesterday with enema 15. Tobacco abuse: Counsel   LOS (Days) 2 [place] T, MD 07/06/2016 9:06 AM

## 2016-07-06 NOTE — Progress Notes (Signed)
Occupational Therapy Note  Patient Details  Name: Wade Asebedo MRN: [id number] Date of Birth: [birth year]  Today's Date: 07/06/2016 OT Individual Time: 1130-1200 OT Individual Time Calculation (min): 30 min   No c/o pain.  Pt received in standard w/c.  Used stedy for sit to stand from low seat to stand with mod A.  Once standing, worked on holding stand for 45 sec to 1 min with a "rest break" in partial stand with hips on stedy pads. Repeated stand 2 more times. Each stand focused on retracted shoulder blades and forward hips.  Pt tolerated activity well. Transferred to tilt and space w/c.  Set up with lunch tray and assisted pt with cutting food and opening containers. Pt in room with all needs met.   [place] 07/06/2016, 1:10 PM

## 2016-07-06 NOTE — Progress Notes (Signed)
ANTICOAGULATION CONSULT NOTE - Initial Consult  Pharmacy Consult for enoxaparin Indication: DVT  No Known Allergies  Patient Measurements: Height: 6\' 4"  (193 cm) Weight: 258 lb (117 kg) IBW/kg (Calculated) : 86.8  Vital Signs: Temp: 97.5 F (36.4 C) (03/14 1507) Temp Source: Oral (03/14 1507) BP: 104/64 (03/14 1507) Pulse Rate: 84 (03/14 1507)  Labs:  Recent Labs  07/05/16 0529  HGB 11.9*  HCT 34.8*  PLT 300  CREATININE 1.04    Estimated Creatinine Clearance: 97.7 mL/min (by C-G formula based on SCr of 1.04 mg/dL).  Assessment: CC/HPI: 66 yo m admitted to CIR 3/12 for back pain. Now with DVT noted in the right gastroc vein, left peroneal vein, and bilateral posterior tibial veins.    PMH: DM, CVA 2010, HTN, multiple lumbar disc surgery   Plan:  Lovenox 1 mg/kg q12h Monitor renal fx, cbc  Levester Fresh, PharmD, BCPS, BCCCP Clinical Pharmacist 07/06/2016 5:14 PM

## 2016-07-06 NOTE — Progress Notes (Signed)
*  PRELIMINARY RESULTS* Vascular Ultrasound Lower extremity venous duplex has been completed.  Preliminary findings: DVT noted in the right gastroc vein, left peroneal vein, and bilateral posterior tibial veins.  Called results to RN.   Landry Mellow, RDMS, RVT  07/06/2016, 4:52 PM

## 2016-07-06 NOTE — Progress Notes (Addendum)
Physical Therapy Session Note  Patient Details  Name: Rodney Crane MRN: [id number] Date of Birth: 1951/02/12  Today's Date: 07/06/2016 PT Individual Time: 1030-1130 PT Individual Time Calculation (min): 60 min   Short Term Goals: Week 1:  PT Short Term Goal 1 (Week 1): Pt will demonstrated bed mobility minA PT Short Term Goal 2 (Week 1): Pt will transfer modA w/c <>bed  PT Short Term Goal 3 (Week 1): Pt will perform sit <>stand minA PT Short Term Goal 4 (Week 1): Pt will propel w/c BUE with minA PT Short Term Goal 5 (Week 1): Pt will initiate gait training  Skilled Therapeutic Interventions/Progress Updates: Pt received seated in tilt-in-space w/c, denies pain stating it is "starting to subside" and reports he was pre-medicated for session. Transport to gym [place]. Transfer w/c >mat table with transfer board and maxA to facilitate weight shifting, cues for hand placement and head/hips relationship. Sit <>stand from elevated mat table x3 reps with modA and RW. Table lowered to 1" above height of w/c; modA overall. Standing marching x3 trials with approximately 10-15 sec standing tolerance before LEs fatigued. Gait 2 trials x3-4' each before fatigued and requiring seated rest break; poor RLE foot clearance and decr weight shift to R to allow for L clearance. R foot-up AFO worn during trial, and therapist provided facilitation at [place] for stance control. Sit <>stand x2 trials from w/c with maxA due to lower seat height. On second trial, tilt-in-space w/c replaced with standard w/c. W/c propulsion 2x50', 1x75' with S and BUE; theraband donned to w/c rims to improve grip. Remained seated in standard chair at sink setup to brush teeth before next session. Alerted next therapist to plan of returning to tilt in space w/c at end of her session for pt to rest in.      Therapy Documentation Precautions:  Precautions Precautions: Cervical, Fall Required Braces or Orthoses: Cervical Brace Cervical  Brace: Hard collar, At all times   See Function Navigator for Current Functional Status.   Therapy/Group: Individual Therapy  [name] 07/06/2016, 11:56 AM

## 2016-07-06 NOTE — Progress Notes (Signed)
Occupational Therapy Session Note  Patient Details  Name: Muaaz Brau MRN: [id number] Date of Birth: 05/23/1950  Today's Date: 07/06/2016 OT Individual Time: 2500-3704 and 1400-1500 OT Individual Time Calculation (min): 60 min and 60 min   Short Term Goals: Week 1:  OT Short Term Goal 1 (Week 1): Pt will sit unsupported to bathe and dress UB with close supervision OT Short Term Goal 2 (Week 1): Pt will transfer bed to w/c with mod A +1 in prep for ADL tasks  OT Short Term Goal 3 (Week 1): Pt will perform sit to stand for clothing management with mod A  OT Short Term Goal 4 (Week 1): Pt will perform bed mobility with mod A with bed rails in prep for ADL task  Skilled Therapeutic Interventions/Progress Updates:    Session One: Pt seen for OT ADL bathing/dressing session. Pt sitting EOB upon arrival, cervical brace removed and wife present. Re-donned cervical brace and educated regarding MD orders for cervical brace on at all times- all voiced understanding. He transferred from EOB> tilt in space w/c via STEADY, standing into STEADY with mod A.  Standing in STEADY, buttock hygiene performed and new brief donned total A. PT tolerated ~20-30 seconds each trial before requiring seated rest break. Mod-max A required for controlled descent into w/c.  He bathed seated in w/c with set-up assist. Pt able to cross ankle over knee to donn/doff footwear, thread pants, and wash feet. He stood into STEADY for pants to be pulled up.  Pt left tilted back in w/c at end of session, all needs in reach.   Session Two: Pt seen for OT session focusing on functional transfers, standing balance/ endurance and fine motor coordination. Pt standing in STEADY upon arrival with RN present, attempting to void in standing position, negative for void and total A for clothing management. Pt taken to therapy gym and completed mod A stand pivot transfer w/c> therapy mat with VCs for sequencing and assist for RW management. Pt  completed x5 sit <> stands at RW from [facility], initially requiring mod A progressing to min A. Pt tolerated ~30 seconds each standing trial. Pt able to alternate UE support on RW in prep for functional standing task. Pt with perceived LOB to L, and assist provided for controlled descent onto mat though this therapist did not feel pt having LOB episode. While in standing, pt required to remove clothes pins from around pants waistband in simulation of dressing activity. Pt required use of mirror in front for visual feedback as pt unable to feel clothespins due to impaired sensation in B hands. Seated EOM, completed pegboard activity focusing on in-hand manipulation skils. Pt provided with weighted cup as he displayed difficulty managing drinking cup due to decreased perceived grasping strength. He completed stand pivot transfer back to chair in same manner as described above, episode of L knee bucking requiring increased assist for safe placement into w/c. He was returned to room at end of session and left with all needs in reach.   Therapy Documentation Precautions:  Precautions Precautions: Cervical, Fall Required Braces or Orthoses: Cervical Brace Cervical Brace: Hard collar, At all times Pain: Pain Assessment Pain Score: 4  Pain Type: Acute pain Pain Location: Neck Pain Descriptors / Indicators: Aching;Discomfort Pain Intervention(s): Medication (See eMAR), RN aware, repositioned ADL: ADL ADL Comments: see functional navigator  See Function Navigator for Current Functional Status.   Therapy/Group: Individual Therapy  [name], Shya Kovatch C 07/06/2016, 7:13 AM

## 2016-07-07 ENCOUNTER — Inpatient Hospital Stay (HOSPITAL_COMMUNITY): Payer: Medicare Other | Admitting: Physical Therapy

## 2016-07-07 ENCOUNTER — Inpatient Hospital Stay (HOSPITAL_COMMUNITY): Payer: Medicare Other | Admitting: Occupational Therapy

## 2016-07-07 DIAGNOSIS — I82403 Acute embolism and thrombosis of unspecified deep veins of lower extremity, bilateral: Secondary | ICD-10-CM

## 2016-07-07 DIAGNOSIS — I824Z3 Acute embolism and thrombosis of unspecified deep veins of distal lower extremity, bilateral: Secondary | ICD-10-CM | POA: Insufficient documentation

## 2016-07-07 LAB — GLUCOSE, CAPILLARY
GLUCOSE-CAPILLARY: 129 mg/dL — AB (ref 65–99)
GLUCOSE-CAPILLARY: 137 mg/dL — AB (ref 65–99)
GLUCOSE-CAPILLARY: 141 mg/dL — AB (ref 65–99)
Glucose-Capillary: 190 mg/dL — ABNORMAL HIGH (ref 65–99)

## 2016-07-07 NOTE — Progress Notes (Signed)
Social Work Patient ID: Rodney Crane, male   DOB: November 01, 1950, 66 y.o.   MRN: [id number]   Have reviewed team conference with pt and wife.  They are aware of min assist goals and ELOS 3 weeks with target date to be determined next week.  Continue to follow.  Alahia Whicker, LCSW

## 2016-07-07 NOTE — Progress Notes (Signed)
Physical Therapy Session Note  Patient Details  Name: Rodney Crane MRN: [id number] Date of Birth: [birth year]  Today's Date: 07/07/2016 PT Individual Time: 0800-0900 and 1500-1530 PT Individual Time Calculation (min): 60 min and 30 min (total 90 min)   Short Term Goals: Week 1:  PT Short Term Goal 1 (Week 1): Pt will demonstrated bed mobility minA PT Short Term Goal 2 (Week 1): Pt will transfer modA w/c <>bed  PT Short Term Goal 3 (Week 1): Pt will perform sit <>stand minA PT Short Term Goal 4 (Week 1): Pt will propel w/c BUE with minA PT Short Term Goal 5 (Week 1): Pt will initiate gait training  Skilled Therapeutic Interventions/Progress Updates: Tx 1: Pt received seated on EOB finishing breakfast; denies pain and agreeable to treatment. Pt doffs regular socks, dons non-skid socks seated on EOB with S overall; minor LOBs posteriorly however recovers without assist. Transfer bed >w/c and w/c >mat table with transfer board and setup for board placement and cues for hand placement and technique, close S overall with improving UE assist and LE activation for hip clearance. Sit <>stand from elevated mat table x8 reps with RW and modA>minA with repetition. Squat pivot to return to w/c with min guard. Transfer w/c <>nustep squat pivot with min guard. Performed BUE/BLE nustep x8 min on level 5 with average 40 steps/min. Returned to room totalA; remained seated in w/c at end of session, all needs in reach.   Tx 2: Pt received seated in w/c, denies pain but does report significant fatigue. Sit <>stand x8 trials total with RW and modA faded to S with repetition. Educated pt in body mechanics and use of momentum and forward weight shift to A with boost up. Rest breaks between trials due to fatigue, and pt declines attempts to walk in place or perform standing exercises due to fatigue.  Discussed goals with pt including therapy goals set at minA/S overall, with potential to upgrade goal as pt progresses; pt  agreeable. States his goals are "to be able to get around the house, and in and out of the house". Pt remained seated in w/c at end of session with wife and NT present; all needs in reach.      Therapy Documentation Precautions:  Precautions Precautions: Cervical, Fall Required Braces or Orthoses: Cervical Brace Cervical Brace: Hard collar, At all times Restrictions Weight Bearing Restrictions: No Pain: Pain Assessment Pain Assessment: 0-10 Pain Score: 4  Pain Type: Acute pain Pain Location: Back Pain Orientation: Lower;Medial Pain Descriptors / Indicators: Aching Pain Frequency: Intermittent Pain Onset: On-going Pain Intervention(s): Medication (See eMAR) Multiple Pain Sites: No  See Function Navigator for Current Functional Status.   Therapy/Group: Individual Therapy  [name] 07/07/2016, 8:47 AM

## 2016-07-07 NOTE — Progress Notes (Signed)
    Subjective:    Patient reports pain as 2 on 0-10 scale.   Denies CP or SOB.  Voiding without difficulty. Positive flatus.  Pt has increased mobility of b/l upper extremities.  Pt is noting increasing strength in his lower extremities.  Weakness still present in RLE.  Pt is making progress at inpatient rehab Objective: Vital signs in last 24 hours: Temp:  [97.5 F (36.4 C)-98.6 F (37 C)] 98.6 F (37 C) (03/15 0500) Pulse Rate:  [79-84] 79 (03/15 0500) Resp:  [16-18] 16 (03/15 0500) BP: (104-139)/(64-79) 139/79 (03/15 0500) SpO2:  [100 %] 100 % (03/15 0500)  Intake/Output from previous day: 03/14 0701 - 03/15 0700 In: 960 [P.O.:960] Out: 1075 [Urine:1075] Intake/Output this shift: Total I/O In: 240 [P.O.:240] Out: 400 [Urine:400]  Labs:  Recent Labs  07/05/16 0529  HGB 11.9*    Recent Labs  07/05/16 0529  WBC 12.1*  RBC 3.98*  HCT 34.8*  PLT 300    Recent Labs  07/05/16 0529  NA 133*  K 4.0  CL 99*  CO2 25  BUN 18  CREATININE 1.04  GLUCOSE 174*  CALCIUM 9.0   No results for input(s): LABPT, INR in the last 72 hours.  Physical Exam: Neurologically intact ABD soft Sensation intact distally Incision: no drainage Compartment soft Aspen collar in place Assessment/Plan:  Continue treatment in Inpatient rehab    Pt believes he will be her another 2-3 weeks Pt may remove collar to eat  Giankarlo Leamer, Darla Lesches for Dr. Melina Schools [facility] [phone] 07/07/2016, 1:09 PM    Patient ID: Rodney Crane, male   DOB: June 25, 1950, 66 y.o.   MRN: [id number]

## 2016-07-07 NOTE — IPOC Note (Signed)
Overall Plan of Care [facility]) Patient Details Name: Diandre Merica MRN: [id number] DOB: [birth year]  Admitting Diagnosis: Central Cord Syndrome  Hospital Problems: Principal Problem:   Central cord syndrome [facility]) Active Problems:   Essential hypertension   Type 2 diabetes mellitus with peripheral neuropathy (HCC)   Neurogenic bowel   Acute deep vein thrombosis (DVT) of both lower extremities (HCC)     Functional Problem List: Nursing Bladder, Bowel, Edema, Endurance, Medication Management, Motor, Nutrition, Pain, Perception, Safety, Sensory, Skin Integrity  PT Balance, Endurance, Motor, Pain, Safety, Sensory, Skin Integrity  OT Balance, Safety, Sensory, Edema, Skin Integrity, Endurance, Motor, Nutrition, Pain  SLP    TR         Basic ADL's: OT Eating, Grooming, Bathing, Dressing, Toileting     Advanced  ADL's: OT       Transfers: PT Bed Mobility, Car, Bed to Chair, Manufacturing systems engineer, Metallurgist: PT Ambulation, Emergency planning/management officer, Stairs     Additional Impairments: OT Fuctional Use of Upper Extremity  SLP        TR      Anticipated Outcomes Item Anticipated Outcome  Self Feeding supervision   Swallowing      Basic self-care  supervision  Toileting  supervision   Bathroom Transfers supervision   Bowel/Bladder  Mod I  Transfers  S with LRAD  Locomotion  minA with LRAD  Communication     Cognition     Pain  3 or less  Safety/Judgment  Minimal assist   Therapy Plan: PT Intensity: Minimum of 1-2 x/day ,45 to 90 minutes PT Frequency: 5 out of 7 days PT Duration Estimated Length of Stay: 3 weeks OT Intensity: Minimum of 1-2 x/day, 45 to 90 minutes OT Frequency: 5 out of 7 days OT Duration/Estimated Length of Stay: ~21-28 days  SLP Duration/Estimated Length of Stay: Defer to OT/PT       Team Interventions: Nursing Interventions Patient/Family Education, Pain Management, Bladder Management, Bowel Management, Disease  Management/Prevention, Medication Management  PT interventions Ambulation/gait training, Discharge planning, Disease management/prevention, Medical illustrator training, DME/adaptive equipment instruction, Community reintegration, Technical sales engineer stimulation, Barrister's clerk education, IT trainer, UE/LE Coordination activities, UE/LE Strength taining/ROM, Splinting/orthotics, Pain management, Neuromuscular re-education, Skin care/wound management, Therapeutic Exercise, Wheelchair propulsion/positioning, Therapeutic Activities, Psychosocial support, Functional mobility training  OT Interventions Balance/vestibular training, Discharge planning, Functional electrical stimulation, Pain management, Self Care/advanced ADL retraining, Therapeutic Activities, UE/LE Coordination activities, Therapeutic Exercise, Skin care/wound managment, Patient/family education, Functional mobility training, Disease mangement/prevention, Academic librarian, Engineer, drilling, Neuromuscular re-education, Psychosocial support, Splinting/orthotics, UE/LE Strength taining/ROM, Wheelchair propulsion/positioning  SLP Interventions    TR Interventions    SW/CM Interventions Discharge Planning, Barrister's clerk, Patient/Family Education    Team Discharge Planning: Destination: PT-Home ,OT- Home , SLP-Home Projected Follow-up: PT-Outpatient PT, OT-  Home health OT, Outpatient OT, SLP-None Projected Equipment Needs: PT-To be determined, OT- To be determined, SLP-None recommended by SLP Equipment Details: PT-has w/c, OT-  Patient/family involved in discharge planning: PT- Patient, Family member/caregiver,  OT-Patient, Family member/caregiver, SLP-Patient  MD ELOS: 8-13d Medical Rehab Prognosis:  Good Assessment:  66 y.o.right handed malewith history of diabetes mellitus, CVA 2010, hypertension, cervical discectomy 2004 and multiple lumbar disc surgery and tobacco abuse. Per chart review and  patient,patient lives with spouse. One level home. Patient reports he was independent until approximately one month ago and then with multiple falls and now using a rolling walker for short distance ambulation. Present 06/26/2016 with progressive back and leg pain as well  as multiple falls followed as an outpatient by Dr. Rolena Infante. MRI reviewed, showing spinal stenosis C3-4 level as well as C2-3. Preoperative clearance echocardiogram with ejection fraction 11% grade 1 diastolic dysfunction. Underwent posterior cervical fusion C2-C5, C2-C4 decompression 06/26/2016 per Dr. Rolena Infante. Patient did require short-term intubation until 06/28/2016. Acute blood loss anemia and was transfused. Hospital course pain management. Hard cervical collar at all times.Previous lumbar MRI done December 2017 showed significant spinal stenosis L1-S1. Previous lumbar decompression L2-S1. Multiple level facet arthrosis contributing to lateral recess stenosis and continues to be followed by orthopedic services awaiting planned for further revision decompression of Lumbar spinal stenosis in approximately the next 2 weeks  Now requiring 24/7 Rehab RN,MD, as well as CIR level PT, OT and SLP.  Treatment team will focus on ADLs and mobility with goals set at Sup  See Team Conference Notes for weekly updates to the plan of care

## 2016-07-07 NOTE — Progress Notes (Signed)
[facility] PHYSICAL MEDICINE & REHABILITATION     PROGRESS NOTE    Subjective/Complaints: Had struggles with bed last night. Mattress thin and felt like he was sleeping on "metal bars" all night. Otherwise progressing in therapies  ROS: pt denies nausea, vomiting, diarrhea, cough, shortness of breath or chest pain  Objective: Vital Signs: Blood pressure 139/79, pulse 79, temperature 98.6 F (37 C), temperature source Oral, resp. rate 16, height 6\' 4"  (1.93 m), weight 117 kg (258 lb), SpO2 100 %. No results found.  Recent Labs  07/05/16 0529  WBC 12.1*  HGB 11.9*  HCT 34.8*  PLT 300    Recent Labs  07/05/16 0529  NA 133*  K 4.0  CL 99*  GLUCOSE 174*  BUN 18  CREATININE 1.04  CALCIUM 9.0   CBG (last 3)   Recent Labs  07/06/16 1707 07/06/16 2056 07/07/16 0658  GLUCAP 93 128* 129*    Wt Readings from Last 3 Encounters:  07/06/16 117 kg (258 lb)  07/02/16 133.7 kg (294 lb 11.2 oz)  06/23/16 130.2 kg (287 lb)    Physical Exam:  Constitutional: He is oriented to person, place, and time. He appears well-developed.  HENT:  Head: Normocephalic and atraumatic.  Eyes: Conjunctivae and EOM are normal. Left eye exhibits no discharge.  Neck:  Cervical collar in place. Posterior neck incision is clean and dry  Cardiovascular: RRR  Respiratory: clear bilaterally  GI: Soft. Bowel sounds are normal.  ND, + bowel sounds  Musculoskeletal: He exhibits tr edema in upper and lower ext Neurological: He is alert and oriented to person, place, and time.  Motor: RUE: Shoulder abduction 4-/5, elbow flexion/extension 4/5, wrist/hand 4 to 4+/5 LUE: Shoulder abduction 4-/5, elbow flexion/extension 4/5, wrist/hand 4+/5 RLE: HF, KE 2 to 2+/5, ADF/PF 2- to 2/5 LLE: HF, KE 3+/5, ADF/PF 4/5 Sensation diminished to light touch in hands and feet--stable neuro exam DTRs symmetric   Skin: Skin is warm and dry.  Psychiatric: pleasant and cooperative.    Assessment/Plan: 1.  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 Physiatrist is providing close team supervision and 24 hour management of active medical problems listed below. Physiatrist and rehab team continue to assess barriers to discharge/monitor patient progress toward functional and medical goals.  Function:  Bathing Bathing position   Position: Wheelchair/chair at sink  Bathing parts Body parts bathed by patient: Right arm, Left arm, Chest, Abdomen, Right upper leg, Left upper leg, Right lower leg, Left lower leg Body parts bathed by helper: Front perineal area, Buttocks, Back  Bathing assist Assist Level: Touching or steadying assistance(Pt > 75%)      Upper Body Dressing/Undressing Upper body dressing   What is the patient wearing?: Pull over shirt/dress     Pull over shirt/dress - Perfomed by patient: Thread/unthread right sleeve, Thread/unthread left sleeve, Pull shirt over trunk Pull over shirt/dress - Perfomed by helper: Put head through opening        Upper body assist Assist Level: Touching or steadying assistance(Pt > 75%)      Lower Body Dressing/Undressing Lower body dressing   What is the patient wearing?: Non-skid slipper socks, Ted Hose   Underwear - Performed by helper: Thread/unthread right underwear leg, Thread/unthread left underwear leg, Pull underwear up/down Pants- Performed by patient: Thread/unthread right pants leg, Thread/unthread left pants leg Pants- Performed by helper: Pull pants up/down   Non-skid slipper socks- Performed  by helper: Don/doff right sock, Don/doff left sock       Shoes - Performed by helper: Don/doff right shoe, Don/doff left shoe       TED Hose - Performed by helper: Don/doff right TED hose, Don/doff left TED hose  Lower body assist Assist for lower body dressing: 2 Helpers (Standing in STEADY)       Toileting Toileting     Toileting steps completed by helper: Adjust clothing prior to toileting, Performs perineal hygiene, Adjust clothing after toileting Toileting Assistive Devices: Other (comment) (steady)  Toileting assist Assist level: Two helpers   Transfers Chair/bed transfer   Chair/bed transfer method: Lateral scoot Chair/bed transfer assist level: Touching or steadying assistance (Pt > 75%) Chair/bed transfer assistive device: Sliding board, Armrests Mechanical lift: Stedy   Locomotion Ambulation Ambulation activity did not occur: Safety/medical concerns   Max distance: 3' Assist level: Maximal assist (Pt 25 - 49%)   Wheelchair   Type: Manual Max wheelchair distance: 75 Assist Level: Supervision or verbal cues  Cognition Comprehension Comprehension assist level: Follows complex conversation/direction with no assist  Expression Expression assist level: Expresses complex ideas: With no assist  Social Interaction Social Interaction assist level: Interacts appropriately with others - No medications needed.  Problem Solving Problem solving assist level: Solves complex problems: Recognizes & self-corrects  Memory Memory assist level: Complete Independence: No helper   Medical Problem List and Plan: 1.  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  DVT Prophylaxis/Anticoagulation: Dopplers with DVT in right gastroc and left peroneal and bilateral post tib veins  -began mg/kg lovenox  -recheck dopplers in about 1-2 weeks 3. Pain Management: Robaxin and oxycodone as needed 4. Mood: Provide emotional support 5. Neuropsych: This patient is capable of making decisions on his own behalf. 6. Skin/Wound Care: Routine skin checks 7. Fluids/Electrolytes/Nutrition:     -encourage PO 8. Acute blood loss anemia.  hgb stable 9. OSA. CPAP 10. Hypertension. HCTZ 12.5 mg daily, Toprol-XL 50 mg daily, Avapro 300 mg daily. Monitor with increased mobility 11. Diabetes mellitus with peripheral neuropathy. Hemoglobin A1c 6.0.SSI.  Check blood sugars before meals and at bedtime. Patient on Glucophage 1000 mg twice a day prior to admission. Resumed glucophage at 500mg  bid  -changed diet to carb mod.   -sugars with improved control 12. History of CVA. Plavix currently on hold as awaiting plan further lumbar surgery eventually (would continue to hold while on treatment dose lovenox) 13. Hyperlipidemia. Lipitor 14. Severe Constipation. Large loose bm yesterday with enema 15. Tobacco abuse: Counsel   LOS (Days) 3 [place] T, MD 07/07/2016 9:01 AM

## 2016-07-07 NOTE — Progress Notes (Signed)
Occupational Therapy Session Note  Patient Details  Name: Rodney Crane MRN: [id number] Date of Birth: 03-Mar-1951  Today's Date: 07/07/2016 OT Individual Time: 0945-1100 OT Individual Time Calculation (min): 75 min    Short Term Goals: Week 1:  OT Short Term Goal 1 (Week 1): Pt will sit unsupported to bathe and dress UB with close supervision OT Short Term Goal 2 (Week 1): Pt will transfer bed to w/c with mod A +1 in prep for ADL tasks  OT Short Term Goal 3 (Week 1): Pt will perform sit to stand for clothing management with mod A  OT Short Term Goal 4 (Week 1): Pt will perform bed mobility with mod A with bed rails in prep for ADL task  Skilled Therapeutic Interventions/Progress Updates:    Pt seen for OT ADL bathing/dressing session. Pt sitting up in w/c upon arrival, agreeable to tx session. STEADY used for transfers throughout session due to difficulty of transfers and pt fatigue level. He transferred into shower and completed bathing task seated on tub bench. He was able to cross ankle over knee to wash B feet.  He returned to bed for cervical brace pads to be changed and shaving completed total A in supine. He returned to EOB to complete dressing task, stood and with +2 assist pants pulled up. He completed supervision squat pivot transfer to w/c with assist to steady equipment.  Pt left seated in w/c at end of session, all needs in reach.   Therapy Documentation Precautions:  Precautions Precautions: Cervical, Fall Required Braces or Orthoses: Cervical Brace Cervical Brace: Hard collar, At all times Restrictions Weight Bearing Restrictions: No Pain:   No/ denies pain ADL: ADL ADL Comments: see functional navigator  See Function Navigator for Current Functional Status.   Therapy/Group: Individual Therapy  [name], Kipper Buch C 07/07/2016, 7:11 AM

## 2016-07-07 NOTE — Progress Notes (Signed)
Physical Therapy Session Note  Patient Details  Name: Rodney Crane MRN: [id number] Date of Birth: [birth year]  Today's Date: 07/07/2016 PT Individual Time: 2563-8937 PT Individual Time Calculation (min): 15 min   Short Term Goals: Week 1:  PT Short Term Goal 1 (Week 1): Pt will demonstrated bed mobility minA PT Short Term Goal 2 (Week 1): Pt will transfer modA w/c <>bed  PT Short Term Goal 3 (Week 1): Pt will perform sit <>stand minA PT Short Term Goal 4 (Week 1): Pt will propel w/c BUE with minA PT Short Term Goal 5 (Week 1): Pt will initiate gait training  Skilled Therapeutic Interventions/Progress Updates:    no c/o pain, but reporting fatigue from just having finished a therapy session.  Pt and wife with questions about voiding in urinal with clothing on.  PT demonstrated clothing management and placement for pt and wife, pt with (+) void in urinal.  Discussed positioning differences in bed versus in tilt in space w/c, also discussed progression to use of urinal in standing once able to with therapy.  BLE stretching for heel cords, hamstrings, and hip flexors x45 seconds each.  Pt left upright in tilt in space at end of session with call bell in reach and needs met.   Therapy Documentation Precautions:  Precautions Precautions: Cervical, Fall Required Braces or Orthoses: Cervical Brace Cervical Brace: Hard collar, At all times Restrictions Weight Bearing Restrictions: No General: PT Amount of Missed Time (min): 15 Minutes PT Missed Treatment Reason: Patient fatigue   See Function Navigator for Current Functional Status.   Therapy/Group: Individual Therapy  Earnest Conroy Penven-Crew 07/07/2016, 4:39 PM

## 2016-07-08 ENCOUNTER — Inpatient Hospital Stay (HOSPITAL_COMMUNITY): Payer: Medicare Other | Admitting: Occupational Therapy

## 2016-07-08 ENCOUNTER — Inpatient Hospital Stay (HOSPITAL_COMMUNITY): Payer: Medicare Other | Admitting: Physical Therapy

## 2016-07-08 DIAGNOSIS — I824Z3 Acute embolism and thrombosis of unspecified deep veins of distal lower extremity, bilateral: Secondary | ICD-10-CM

## 2016-07-08 DIAGNOSIS — E1142 Type 2 diabetes mellitus with diabetic polyneuropathy: Secondary | ICD-10-CM

## 2016-07-08 DIAGNOSIS — I10 Essential (primary) hypertension: Secondary | ICD-10-CM

## 2016-07-08 LAB — GLUCOSE, CAPILLARY
GLUCOSE-CAPILLARY: 122 mg/dL — AB (ref 65–99)
Glucose-Capillary: 142 mg/dL — ABNORMAL HIGH (ref 65–99)
Glucose-Capillary: 135 mg/dL — ABNORMAL HIGH (ref 65–99)
Glucose-Capillary: 127 mg/dL — ABNORMAL HIGH (ref 65–99)

## 2016-07-08 LAB — CBC
HCT: 34.7 % — ABNORMAL LOW (ref 39.0–52.0)
HEMOGLOBIN: 11.8 g/dL — AB (ref 13.0–17.0)
MCH: 29.7 pg (ref 26.0–34.0)
MCHC: 34 g/dL (ref 30.0–36.0)
MCV: 87.4 fL (ref 78.0–100.0)
Platelets: 406 10*3/uL — ABNORMAL HIGH (ref 150–400)
RBC: 3.97 MIL/uL — AB (ref 4.22–5.81)
RDW: 13 % (ref 11.5–15.5)
WBC: 9.8 10*3/uL (ref 4.0–10.5)

## 2016-07-08 MED ORDER — BISACODYL 10 MG RE SUPP
10.0000 mg | Freq: Every day | RECTAL | Status: DC
  Filled 2016-07-08: qty 1

## 2016-07-08 MED ORDER — SENNOSIDES-DOCUSATE SODIUM 8.6-50 MG PO TABS
2.0000 | ORAL_TABLET | Freq: Every day | ORAL | Status: DC
  Administered 2016-07-14 – 2016-07-15 (×2): 2 via ORAL
  Filled 2016-07-08 (×11): qty 2

## 2016-07-08 MED ORDER — POLYETHYLENE GLYCOL 3350 17 G PO PACK
17.0000 g | PACK | Freq: Every day | ORAL | Status: DC
  Administered 2016-07-11 – 2016-07-20 (×10): 17 g via ORAL
  Filled 2016-07-08 (×11): qty 1

## 2016-07-08 NOTE — Progress Notes (Signed)
Set up CPAP machine for pt however mask does not fit hospital tubing.  Told pt that I would need to leave to find the proper adapter piece and pt stated that he was fine and does not need to be placed on the machine.  Pt stated that he might wear it tomorrow.

## 2016-07-08 NOTE — Progress Notes (Signed)
Social Work  Social Work Assessment and Plan  Patient Details  Name: Rodney Crane MRN: [id number] Date of Birth: 1950/10/21  Today's Date: 07/07/2016  Problem List:  Patient Active Problem List   Diagnosis Date Noted  . Acute deep vein thrombosis (DVT) of distal vein of both lower extremities ([place]) 07/07/2016  . Abdominal distention   . Abdominal pain   . S/P cervical spinal fusion   . Type 2 diabetes mellitus with peripheral neuropathy ([facility])   . Hyperlipidemia   . Neurogenic bowel   . Myelopathy ([place])   . OSA on CPAP   . Surgery, elective   . History of CVA (cerebrovascular accident)   . Benign essential HTN   . History of cervical spinal surgery   . History of lumbar surgery   . Tobacco abuse   . Diastolic dysfunction   . Acute blood loss anemia   . Post-operative pain   . Tachycardia   . Leukocytosis   . Spinal stenosis of lumbar region   . Central cord syndrome ([place])   . Acute hypoxemic respiratory failure ([place])   . Spinal stenosis in cervical region 06/27/2016  . Cervical stenosis of spinal canal 06/25/2016  . Weakness of extremity 06/25/2016  . OSA (obstructive sleep apnea)   . Hypertension   . Diabetes ([place])   . DYSPNEA ON EXERTION 10/06/2009  . Nonspecific (abnormal) findings on radiological and other examination of body structure 10/06/2009  . CT, CHEST, ABNORMAL 10/06/2009  . COLONIC POLYPS 10/05/2009  . DIABETES, TYPE 2 10/05/2009  . HYPERLIPIDEMIA 10/05/2009  . 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 Essential hypertension 10/05/2009  . CVA 10/05/2009  . 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 Arthritis   . Colon polyps   . CVA (cerebral infarction) 2010  . Diabetes mellitus   . Dizziness   . ED (erectile dysfunction)   . Foot drop   . H/O Bell's palsy   . Hyperlipidemia   . Hypertension   . Obesity   . OSA on CPAP   . Spinal stenosis   . Stroke ([place])   . Unsteady gait    Past  Surgical History:  Past Surgical History:  Procedure Laterality Date  . CARPAL TUNNEL RELEASE     bilateral  . CERVICAL DISCECTOMY  2004  . LUMBAR DISC SURGERY     x6  . POSTERIOR CERVICAL FUSION/FORAMINOTOMY N/A 06/27/2016   Procedure: POSTERIOR CERVICAL FUSION C2-C5; C2-C4 Decompression;  Surgeon: Melina Schools, MD;  Location: [facility];  Service: Orthopedics;  Laterality: N/A;   Social History:  reports that he has been smoking Cigarettes.  He has never used smokeless tobacco. He reports that he does not drink alcohol or use drugs.  Family / Support Systems Marital Status: Married How Long?: 40+ yrs Patient Roles: Spouse, Parent, Volunteer Spouse/Significant Other: wife, Rodney Crane @ [phone] or (C) [phone] Children: 2 adult children living in [place] Anticipated Caregiver: wife; children may come to assist only part time Ability/Limitations of Caregiver: wife can provide min assist level; she has had back surgeries also Caregiver Availability: 24/7 Family Dynamics: Wife very involved and supportive.  Staying with pt daily at the hospital.  Social History Preferred language: English Religion: Baptist Cultural Background: NA Education: college Read: Yes Write: Yes Employment Status: Retired Date Retired/Disabled/Unemployed: 51 yrs  Freight forwarder Issues: None Guardian/Conservator: None - per MD, pt is capable of making decisions on his own behalf.  Abuse/Neglect Physical Abuse: Denies Verbal Abuse: Denies Sexual Abuse: Denies Exploitation of patient/patient's resources: Denies Self-Neglect: Denies  Emotional Status Pt's affect, behavior adn adjustment status: Pt very pleasant, talkative and completes assessment interview without difficulty.  He is very pleased with current progress.  Denies any significant emotional distress and describes himself as "very optimistic."  Laughing easily with myself and wife during interview.   Recent Psychosocial Issues:  Declining mobility since falling in Feb and pt feels saddened by how much physical assistance his wife had to provide until he was admitted. Pyschiatric History: None Substance Abuse History: None  Patient / Family Perceptions, Expectations & Goals Pt/Family understanding of illness & functional limitations: Pt and wife with very good understanding of his surgery and current functional limitations/ need for CIR. Premorbid pt/family roles/activities: As noted, pt fell in Feb and was having a steady, functional decline until this hospitalization.  Prior to the fall he was completely independent. Anticipated changes in roles/activities/participation: Wife to continue as  primary caregiver - no changes anticipated as she was assisting PTA. Pt/family expectations/goals: "I want to be able to walk out of here."  [employer]: None Premorbid Home Care/DME Agencies: None Transportation available at discharge: yes  Discharge Planning Living Arrangements: Spouse/significant other Support Systems: Spouse/significant other, Friends/neighbors, Social worker community Type of Residence: Private residence Insurance Resources: [employer], Multimedia programmer (specify) ([employer]) Museum/gallery curator Resources: Center Referred: No Living Expenses: Higher education careers adviser Management: Patient Does the patient have any problems obtaining your medications?: No Home Management: Pt and wife share as he is able. Patient/Family Preliminary Plans: Pt to return home with wife providing 24/7 assistance. Social Work Anticipated Follow Up Needs: HH/OP Expected length of stay: 3 weeks  Clinical Impression Very pleasant gentleman here following cervical surgery with possible lumbar surgery in near future.  Very motivated and pleased with initial gains being made on CIR.  Wife at bedside and very supportive and able to provide 24/7 assistance at home.  Pt and wife deny any significant  concern and pt denies any emotional distress.  Feels he is "finally getting better."  Will follow for support and d/c planning needs.  Rodney Crane 07/07/2016, 4:27 PM

## 2016-07-08 NOTE — Progress Notes (Signed)
Physical Therapy Session Note  Patient Details  Name: Rodney Crane MRN: [id number] Date of Birth: Mar 31, 1951  Today's Date: 07/08/2016 PT Individual Time: 1100-1210 and 1430-1515 PT Individual Time Calculation (min): 70 min and 45 min (total 115 min)   Short Term Goals: Week 1:  PT Short Term Goal 1 (Week 1): Pt will demonstrated bed mobility minA PT Short Term Goal 2 (Week 1): Pt will transfer modA w/c <>bed  PT Short Term Goal 3 (Week 1): Pt will perform sit <>stand minA PT Short Term Goal 4 (Week 1): Pt will propel w/c BUE with minA PT Short Term Goal 5 (Week 1): Pt will initiate gait training  Skilled Therapeutic Interventions/Progress Updates: Tx 1: Pt received seated in w/c, c/o pain as below and agreeable to treatment. Transfer tilt in space w/c >standard w/c with min guard squat pivot. W/c propulsion x125' with BUE for strengthening and aerobic endurance. Gait 2x15' with RW and minA; w/c follow on first trial d/t uncertainty of how far pt would ambulate, second trial therapist setup chair at 15' for pt to aim for. Stand pivot transfer x3 with RW chair>w/c and w/c <>mat table with min guard overall, occasional minA for boosting to stand. Sit <>stand x5 reps from edge of mat table with close S/min guard. Returned to room d/t pt reporting need to use restroom. Stand pivot transfer w/c <>BSC over toilet with RW; alerted NT and updated safety plan for pt to perform stand pivot using RW with nursing staff. Pt with incontinent bowel movement, attempted to perform hygiene without assist however unable to reach fully behind him to clean well enough. Therapist performed additional hygiene to ensure cleanliness; totalA for donning brief and pants. Setup at sink pt performed hand hygiene without assist. Squat pivot transfer w/c >recliner with minA. Remained seated in recliner at end of session, all needs in reach.   Tx 2: Pt received seated in recliner, denies pain but states he is very fatigued.  Seated BUE [place] task with animal toys for UE coordination; pt oriented animals right side up, returned to container; significantly increased time and rest breaks d/t UE fatigue. Card sorting with BUE and increased time. Pt and wife performed squat pivot transfer w/c <>bed per pt request to allow for more independence in room and be able to get up out of bed sooner in the mornings. Wife demonstrates appropriate level of safety with min guard squat pivot transfer; pt and wife together able to identify all environmental barriers and hazards and correct prior to transfer including arm rest, leg rests, setup of w/c. Pt remained seated in w/c at end of session, all needs in reach.      Therapy Documentation Precautions:  Precautions Precautions: Cervical, Fall Required Braces or Orthoses: Cervical Brace Cervical Brace: Hard collar, At all times Restrictions Weight Bearing Restrictions: No Pain: Pain Assessment Pain Score: 2   B shoulders; pre-medicated  See Function Navigator for Current Functional Status.   Therapy/Group: Individual Therapy  [name] 07/08/2016, 1:42 PM

## 2016-07-08 NOTE — Plan of Care (Signed)
Problem: SCI BOWEL ELIMINATION Goal: RH STG SCI MANAGE BOWEL PROGRAM W/ASSIST OR AS APPROPRIATE STG SCI Manage bowel program w/ min assist or as appropriate.   Outcome: Progressing Not using suppository today due to multiple BM last night and today  Problem: RH SKIN INTEGRITY Goal: RH STG SKIN FREE OF INFECTION/BREAKDOWN Free of skin breakdown and infection min assist  Outcome: Not Progressing Sheet shear to buttocks  Problem: RH PAIN MANAGEMENT Goal: RH STG PAIN MANAGED AT OR BELOW PT'S PAIN GOAL 2 or less  Outcome: Progressing PRN administered keeping pain around 2 or 3

## 2016-07-08 NOTE — Progress Notes (Signed)
Occupational Therapy Session Note  Patient Details  Name: Rodney Crane MRN: [id number] Date of Birth: 09-14-1950  Today's Date: 07/08/2016 OT Individual Time: 0852-1000 and 1300-1400 OT Individual Time Calculation (min): 68 min and 60 min   Short Term Goals: Week 1:  OT Short Term Goal 1 (Week 1): Pt will sit unsupported to bathe and dress UB with close supervision OT Short Term Goal 2 (Week 1): Pt will transfer bed to w/c with mod A +1 in prep for ADL tasks  OT Short Term Goal 3 (Week 1): Pt will perform sit to stand for clothing management with mod A  OT Short Term Goal 4 (Week 1): Pt will perform bed mobility with mod A with bed rails in prep for ADL task  Skilled Therapeutic Interventions/Progress Updates:    Session One: Pt seen for OT ADL bathing/dressing session. Pt sitting EOB upon arrival with wife present, agreeable to tx session. He completed min guard scoot pivot to w/c. Bathing/dressing completed seated at sink, able to cross ankle over knee to wash feet and thread pants. Required multiple trials before successful stand, more successful when cued to "rock" for momentum into stand. Pt able to assist with puling pants up, alternating UE support, required assist to pull over brief. He completed grooming task at sink.  Pt left seated in w/c at end of session, al needs in reach. Educated throughout session regarding importance of directing care, activity progression, and OT/PT goals.   Session Two: Pt seen for OT session focusing on ADL re-training and fine motor coordination in prep for dressing task. Pt sitting up in recliner upon arrival, agreeable to tx session. Voiced 4/10 pain in neck, RN aware and medication administered.  Throughout session, pt completed stand pivot transfers with min A using RW. He completed stand pivot to [facility] over toilet, and with steadying assist, pt able to manage clothing to pull down pants, assist required for hygiene and to pull pants up. In therapy gym,  pt completed squat pivot with close supervision, assist to steady equipment. He completed fine motor task unbuttoning large and small buttons. He was unable to manipulate buttons to button back up. Pt returned to room at end of session, left seated in w/c with all needs in reach.     Therapy Documentation Precautions:  Precautions Precautions: Cervical, Fall Required Braces or Orthoses: Cervical Brace Cervical Brace: Hard collar, At all times Restrictions Weight Bearing Restrictions: No ADL: ADL ADL Comments: see functional navigator  See Function Navigator for Current Functional Status.   Therapy/Group: Individual Therapy  [name], [name] 07/08/2016, 6:56 AM

## 2016-07-08 NOTE — Care Management Note (Signed)
Inpatient [facility] Individual Statement of Services  Patient Name:  Rodney Crane  Date:  07/08/2016  Welcome to the [facility].  Our goal is to provide you with an individualized program based on your diagnosis and situation, designed to meet your specific needs.  With this comprehensive rehabilitation program, you will be expected to participate in at least 3 hours of rehabilitation therapies Monday-Friday, with modified therapy programming on the weekends.  Your rehabilitation program will include the following services:  Physical Therapy (PT), Occupational Therapy (OT), 24 hour per day rehabilitation nursing, Therapeutic Recreaction (TR), Case Management (Social Worker), Rehabilitation Medicine, Nutrition Services and Pharmacy Services  Weekly team conferences will be held on Tuesdays to discuss your progress.  Your Social Worker will talk with you frequently to get your input and to update you on team discussions.  Team conferences with you and your family in attendance may also be held.  Expected length of stay: 3 weeks  Overall anticipated outcome: minimal assistance  Depending on your progress and recovery, your program may change. Your Social Worker will coordinate services and will keep you informed of any changes. Your Social Worker's name and contact numbers are listed  below.  The following services may also be recommended but are not provided by the [facility] will be made to provide these services after discharge if needed.  Arrangements include referral to agencies that provide these services.  Your insurance has been verified to be:  Medicare and [employer] Your primary doctor is:  Dr. Brigitte Pulse  Pertinent information will be shared with your doctor and your insurance company.  Social Worker:  [place],  [place] or [phone]   Information discussed with and copy given to patient by: Lennart Pall, 07/08/2016, 3:39 PM

## 2016-07-08 NOTE — Progress Notes (Signed)
PHYSICAL MEDICINE & REHABILITATION     PROGRESS NOTE    Subjective/Complaints: Slept better on new mattress. Bowels active after intervention yesterday. Prefers a PM bowel program  ROS: pt denies nausea, vomiting, diarrhea, cough, shortness of breath or chest pain  Objective: Vital Signs: Blood pressure 136/71, pulse 98, temperature 97.8 F (36.6 C), temperature source Oral, resp. rate 16, height 6\' 4"  (1.93 m), weight 117 kg (258 lb), SpO2 100 %. No results found.  Recent Labs  07/08/16 0531  WBC 9.8  HGB 11.8*  HCT 34.7*  PLT 406*   No results for input(s): NA, K, CL, GLUCOSE, BUN, CREATININE, CALCIUM in the last 72 hours.  Invalid input(s): CO CBG (last 3)   Recent Labs  07/07/16 1639 07/07/16 2034 07/08/16 0632  GLUCAP 137* 141* 122*    Wt Readings from Last 3 Encounters:  07/06/16 117 kg (258 lb)  07/02/16 133.7 kg (294 lb 11.2 oz)  06/23/16 130.2 kg (287 lb)    Physical Exam:  Constitutional: He is oriented to person, place, and time. He appears well-developed.  HENT:  Head: Normocephalic and atraumatic.  Eyes: Conjunctivae and EOM are normal. Left eye exhibits no discharge.  Neck:  Cervical collar fits appropriately. Posterior neck incision is clean and dry  Cardiovascular: RRR Respiratory: CTA B  GI: Soft. Bowel sounds are normal.  ND, + bowel sounds  Musculoskeletal: He exhibits tr edema in upper and lower ext Neurological: He is alert and oriented to person, place, and time.  Motor: RUE: Shoulder abduction 4-/5, elbow flexion/extension 4/5, wrist/hand 4 to 4+/5 LUE: Shoulder abduction 4-/5, elbow flexion/extension 4/5, wrist/hand 4+/5 RLE: HF, KE 2 to 2+/5, ADF/PF   2/5 LLE: HF, KE 3+/5, ADF/PF 4/5 Sensation diminished to light touch in hands and feet--sl improvement DTRs symmetric   Skin: Skin is warm and dry.  Psychiatric: pleasant and cooperative.    Assessment/Plan: 1. 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 Physiatrist is providing close team supervision and 24 hour management of active medical problems listed below. Physiatrist and rehab team continue to assess barriers to discharge/monitor patient progress toward functional and medical goals.  Function:  Bathing Bathing position   Position: Shower  Bathing parts Body parts bathed by patient: Right arm, Left arm, Chest, Abdomen, Right upper leg, Left upper leg, Right lower leg, Left lower leg Body parts bathed by helper: Front perineal area, Buttocks, Back  Bathing assist Assist Level: Touching or steadying assistance(Pt > 75%)      Upper Body Dressing/Undressing Upper body dressing   What is the patient wearing?: Pull over shirt/dress, Orthosis     Pull over shirt/dress - Perfomed by patient: Thread/unthread right sleeve, Thread/unthread left sleeve, Pull shirt over trunk, Put head through opening Pull over shirt/dress - Perfomed by helper: Put head through opening     Orthosis activity level: Performed by helper  Upper body assist Assist Level: Touching or steadying assistance(Pt > 75%)      Lower Body Dressing/Undressing Lower body dressing   What is the patient wearing?: Non-skid slipper socks, Pants   Underwear - Performed by helper: Thread/unthread right underwear leg, Thread/unthread left underwear leg, Pull underwear up/down Pants- Performed by patient: Thread/unthread right pants leg, Thread/unthread left pants leg Pants- Performed by helper: Pull pants up/down   Non-skid slipper socks- Performed by helper: Don/doff right sock, Don/doff left sock  Shoes - Performed by helper: Don/doff right shoe, Don/doff left shoe       TED Hose - Performed by helper: Don/doff right TED hose, Don/doff left TED hose  Lower body assist Assist for lower body dressing: 2 Helpers      Toileting Toileting      Toileting steps completed by helper: Adjust clothing prior to toileting, Performs perineal hygiene, Adjust clothing after toileting Toileting Assistive Devices: Other (comment) (STEADY)  Toileting assist Assist level: Two helpers   Transfers Chair/bed transfer   Chair/bed transfer method: Lateral scoot Chair/bed transfer assist level: Touching or steadying assistance (Pt > 75%) Chair/bed transfer assistive device: Sliding board, Armrests Mechanical lift: Stedy   Locomotion Ambulation Ambulation activity did not occur: Safety/medical concerns   Max distance: 3' Assist level: Maximal assist (Pt 25 - 49%)   Wheelchair   Type: Manual Max wheelchair distance: 75 Assist Level: Supervision or verbal cues  Cognition Comprehension Comprehension assist level: Follows complex conversation/direction with no assist  Expression Expression assist level: Expresses complex ideas: With no assist  Social Interaction Social Interaction assist level: Interacts appropriately with others - No medications needed.  Problem Solving Problem solving assist level: Solves complex problems: Recognizes & self-corrects  Memory Memory assist level: Complete Independence: No helper   Medical Problem List and Plan: 1.  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  DVT Prophylaxis/Anticoagulation: Dopplers with DVT in right gastroc and left peroneal and bilateral post tib veins  -continue mg/kg lovenox  -recheck dopplers in about 1-2 weeks 3. Pain Management: Robaxin and oxycodone as needed 4. Mood: Provide emotional support 5. Neuropsych: This patient is capable of making decisions on his own behalf. 6. Skin/Wound Care: Routine skin checks 7. Fluids/Electrolytes/Nutrition:    -encourage PO 8. Acute  blood loss anemia.  hgb stable 9. OSA. CPAP 10. Hypertension. HCTZ 12.5 mg daily, Toprol-XL 50 mg daily, Avapro 300 mg daily. Monitor with increased mobility 11. Diabetes mellitus with peripheral neuropathy. Hemoglobin A1c 6.0.SSI.  Check blood sugars before meals and at bedtime. Patient on Glucophage 1000 mg twice a day prior to admission.   -Resumed glucophage at 500mg  bid---consider further titration as needed  -changed diet to carb mod.   -sugars with improved control over all 12. History of CVA. Plavix currently on hold as awaiting plan further lumbar surgery eventually (would continue to hold while on treatment dose lovenox) 13. Hyperlipidemia. Lipitor 14. Neurogenic bowel: will change to PM bowel program per pt request 15. Tobacco abuse: Counsel   LOS (Days) 4 Gobles T, MD 07/08/2016 8:47 AM

## 2016-07-09 ENCOUNTER — Inpatient Hospital Stay (HOSPITAL_COMMUNITY): Payer: Medicare Other

## 2016-07-09 ENCOUNTER — Inpatient Hospital Stay (HOSPITAL_COMMUNITY): Payer: Medicare Other | Admitting: Physical Therapy

## 2016-07-09 LAB — GLUCOSE, CAPILLARY
GLUCOSE-CAPILLARY: 143 mg/dL — AB (ref 65–99)
Glucose-Capillary: 144 mg/dL — ABNORMAL HIGH (ref 65–99)
Glucose-Capillary: 158 mg/dL — ABNORMAL HIGH (ref 65–99)
Glucose-Capillary: 197 mg/dL — ABNORMAL HIGH (ref 65–99)

## 2016-07-09 NOTE — Progress Notes (Signed)
Physical Therapy Session Note  Patient Details  Name: Rodney Crane MRN: [id number] Date of Birth: 1950-12-18  Today's Date: 07/09/2016 PT Individual Time: 0926-1010 and 1400-1448 PT Individual Time Calculation (min): 44 min and 48 min   Short Term Goals: Week 1:  PT Short Term Goal 1 (Week 1): Pt will demonstrated bed mobility minA PT Short Term Goal 2 (Week 1): Pt will transfer modA w/c <>bed  PT Short Term Goal 3 (Week 1): Pt will perform sit <>stand minA PT Short Term Goal 4 (Week 1): Pt will propel w/c BUE with minA PT Short Term Goal 5 (Week 1): Pt will initiate gait training  Skilled Therapeutic Interventions/Progress Updates:   AM session: Pt up in w/c with wife present; pt performed w/c mobility x 150' with bilat UE and supervision with extra time and intermittent rest breaks due to numbness in UE.  Performed gait training x 50' with foot up brace and RW with mod A and second person following with w/c when fatigued with verbal cues for full step length, heel strike, and R hip IR.  Performed stair negotiation up/down 6 short steps forwards to ascend and backwards to descend with step to sequence and verbal cues for sequence and tactile cues for RLE extension during stance phase and bilat UE support on bilat rails.  Performed again up/down 8 stairs with bilat rails forwards to ascend and descend with verbal cues and tactile cues on RLE with step to to ascend and alternating to descend.  Performed w/c mobility again x 100' with bilat LE only for hamstring and ankle DF strengthening and activation.  Pt fatigued quickly.  Pt returned to room and left in w/c with wife present.  PM session:  Pt just finished with OT; fatigued but willing to participate.  Pt performed stand pivot transfers w/c <> kinetron with UE support on Kinetron and mod A.  Performed 2 sets x 1:30 seconds seated bilat leg press/extensions at 70 cm/sec + 2 sets x 5 reps standing lateral weight shifts and LE extensions with  tactile and verbal cues to "press through R heel" for increased RLE activation.  Also performed multiple sit <> stand on Kinetron to focus on equal WB and activation of bilat LE during sit > stand.  Returned to w/c and performed sit > stand from w/c with focus on forward weight shift over feet prior to pushing through LE instead of pulling on bars.  At bars performed lateral stepping to L and R x 2 reps with focus on hip ABD activation and lateral weight shifting.  Performed 2 sets x 10 seconds SLS on each LE with tactile cues to maintain activation of RLE.  Pt requesting to use bathroom.  Returned to room and performed stand pivot w/c <> BSC over toilet with UE support on grab bars and mod A.  Pt required assistance for clothing doffing, donning and hygiene due to needing to maintain UE support on bars for balance.  Returned to w/c and left at sink to perform hand hygiene.    Therapy Documentation Precautions:  Precautions Precautions: Cervical, Fall Required Braces or Orthoses: Cervical Brace Cervical Brace: Hard collar, At all times Restrictions Weight Bearing Restrictions: No Pain: Pain Assessment Pain Assessment: 0-10 Pain Score: 3  Pain Type: Acute pain Pain Location: Neck Pain Orientation: Lower Pain Descriptors / Indicators: Discomfort Pain Onset: On-going Pain Intervention(s): Other (Comment) (premedicated)   See Function Navigator for Current Functional Status.   Therapy/Group: Individual Therapy  Raylene Everts [place]  07/09/2016, 10:38 AM

## 2016-07-09 NOTE — Progress Notes (Signed)
Rodney Crane is a 66 y.o. male October 01, 1950 [id number]  Subjective: No new complaints. No new problems. Slept well. Feeling OK.  Objective: Vital signs in last 24 hours: Temp:  [97.8 F (36.6 C)] 97.8 F (36.6 C) (03/17 1533) Pulse Rate:  [78] 78 (03/17 1533) Resp:  [16] 16 (03/17 1533) BP: (102)/(61) 102/61 (03/17 1533) SpO2:  [100 %] 100 % (03/17 1533) Weight change:  Last BM Date: 07/08/16  Intake/Output from previous day: 03/16 0701 - 03/17 0700 In: 840 [P.O.:840] Out: 550 [Urine:550] Last cbgs: CBG (last 3)   Recent Labs  07/08/16 2106 07/09/16 0639 07/09/16 1127  GLUCAP 127* 143* 144*     Physical Exam General: No apparent distress   HEENT: not dry Lungs: Normal effort. Lungs clear to auscultation, no crackles or wheezes. Cardiovascular: Regular rate and rhythm, no edema Abdomen: S/NT/ND; BS(+) Musculoskeletal:  Unchanged.  Neck collar Neurological: No new neurological deficits Wounds: clean    Skin: clear  Aging changes Mental state: Alert, oriented, cooperative In a w/c    Lab Results: BMET    Component Value Date/Time   NA 133 (L) 07/05/2016 0529   K 4.0 07/05/2016 0529   CL 99 (L) 07/05/2016 0529   CO2 25 07/05/2016 0529   GLUCOSE 174 (H) 07/05/2016 0529   BUN 18 07/05/2016 0529   CREATININE 1.04 07/05/2016 0529   CALCIUM 9.0 07/05/2016 0529   GFRNONAA >60 07/05/2016 0529   GFRAA >60 07/05/2016 0529   CBC    Component Value Date/Time   WBC 9.8 07/08/2016 0531   RBC 3.97 (L) 07/08/2016 0531   HGB 11.8 (L) 07/08/2016 0531   HCT 34.7 (L) 07/08/2016 0531   PLT 406 (H) 07/08/2016 0531   MCV 87.4 07/08/2016 0531   MCH 29.7 07/08/2016 0531   MCHC 34.0 07/08/2016 0531   RDW 13.0 07/08/2016 0531   LYMPHSABS 0.0 (L) 07/05/2016 0529   MONOABS 0.0 (L) 07/05/2016 0529   EOSABS 0.0 07/05/2016 0529   BASOSABS 0.0 07/05/2016 0529    Studies/Results: No results found.  Medications: I have reviewed the patient's current  medications.  Assessment/Plan:  1. Central cord syndrome. s/p cervical decompression -- CIR 2. DVT R - Lovenox 3. Pain - Robaxin, Oxycodone prn 4. OSA - on CPAP 5. HTN. HCTZ, Avapro, Toprol XL 6. h/o CVA - Plavix is on hold 7. Dyslipidemia - Lipitor 8. DM2 - metformin 9. Neurogenic bowel - bowel program     Length of stay, days: 5  Walker Kehr , MD 07/09/2016, 4:10 PM

## 2016-07-09 NOTE — Progress Notes (Signed)
ANTICOAGULATION CONSULT NOTE - Follow Up Consult  Pharmacy Consult for enoxaparin Indication: DVT  No Known Allergies  Patient Measurements: Height: 6\' 4"  (193 cm) Weight: 258 lb (117 kg) IBW/kg (Calculated) : 86.8  Vital Signs:    Labs:  Recent Labs  07/08/16 0531  HGB 11.8*  HCT 34.7*  PLT 406*    Estimated Creatinine Clearance: 97.7 mL/min (by C-G formula based on SCr of 1.04 mg/dL).  Assessment: 66 yo M found to have DVT 3/14 in the right gastroc vein, left peroneal vein, and bilateral posterior tibial veins. Pharmacy was consulted to dose Lovenox. Hgb, Plt, and SCr stable. No bleeding noted.   Plan:  Continue Lovenox 115 mg (1 mg/kg) [place] q12h Monitor renal fxn, CBC F/u ability for PO anticoagulation   Gwenlyn Perking, PharmD PGY1 Pharmacy Resident Pager: [phone] 07/09/2016 12:19 PM

## 2016-07-09 NOTE — Progress Notes (Signed)
Occupational Therapy Session Note  Patient Details  Name: Rodney Crane MRN: [id number] Date of Birth: Jul 13, 1950  Today's Date: 07/09/2016 OT Individual Time: 1115-1200 OT Individual Time Calculation (min): 45 min    Short Term Goals: Week 1:  OT Short Term Goal 1 (Week 1): Pt will sit unsupported to bathe and dress UB with close supervision OT Short Term Goal 2 (Week 1): Pt will transfer bed to w/c with mod A +1 in prep for ADL tasks  OT Short Term Goal 3 (Week 1): Pt will perform sit to stand for clothing management with mod A  OT Short Term Goal 4 (Week 1): Pt will perform bed mobility with mod A with bed rails in prep for ADL task  Skilled Therapeutic Interventions/Progress Updates: Therapeutic exercises with focus on hand grip/pinch strengthening.   Pt received seated in his w/c and reporting prior BADL with assist from spouse.   Pt willing reports improved dexterity enabling greater independence with self-feeding and grooming but states paresthesias and weakness as limiting thoroughness.  Pt assessed for grip/pinch strength and was educated on HEP using theraputty (orange, medium grade); written HEP for grip/pinch and Rodney Crane provided.                 R  L Grip   45 lbs   40 lbs (per Jamar hand dyno) Pinch  Key  17  15  Tripod  12  10   Tip  Unable  Unable     Therapy Documentation Precautions:  Precautions Precautions: Cervical, Fall Required Braces or Orthoses: Cervical Brace Cervical Brace: Hard collar, At all times Restrictions Weight Bearing Restrictions: No   Pain: Pain Assessment Pain Assessment: 0-10 Pain Score: 3  Pain Type: Acute pain Pain Location: Neck Pain Orientation: Lower Pain Descriptors / Indicators: Discomfort Pain Onset: On-going Pain Intervention(s): Other (Comment) (premedicated)   ADL: ADL ADL Comments: see functional navigator   See Function Navigator for Current Functional Status.   Therapy/Group: Individual Therapy   Second  session: Time:  1300-1400 Time Calculation (min):   60 min  Pain Assessment: No/denies   Skilled Therapeutic Interventions: ADL-retraining (30 min) with focus on grooming.   Pt received seated in w/c after having finished breakfast.   Pt requested assist with setup for shaving.    OT provided setup to manage aspen collar and applied shaving cream however pt demo'd poor control of right hand with risk for injury resulting in need for total assist to shave using safety razor.   Electric razor recommended but not available for pt during this session.   Therex (30 min) with focus on BUE strengthening using  TES (weight machine) and Sci-Fit (arm ergometry).    Pt completed 2 sets of 12 reps, shoulder pull downs, bicep curls, triceps press down using light resistance (30 lbs for shoulders, 5 lbs for arms).   Pt performed arm ergometry X 10 min and was escorted back to his room at end of session with spouse attending to his needs.   See FIM for current functional status  Therapy/Group: Individual Therapy  [place] 07/09/2016, 12:39 PM

## 2016-07-09 NOTE — Progress Notes (Signed)
Patient placed on CPAP for sleep with home Nasal prong being used. Tolerating well. No issues at this time

## 2016-07-10 ENCOUNTER — Inpatient Hospital Stay (HOSPITAL_COMMUNITY): Payer: Medicare Other

## 2016-07-10 LAB — GLUCOSE, CAPILLARY
GLUCOSE-CAPILLARY: 115 mg/dL — AB (ref 65–99)
GLUCOSE-CAPILLARY: 138 mg/dL — AB (ref 65–99)
Glucose-Capillary: 166 mg/dL — ABNORMAL HIGH (ref 65–99)
Glucose-Capillary: 129 mg/dL — ABNORMAL HIGH (ref 65–99)

## 2016-07-10 NOTE — Progress Notes (Signed)
Rodney Crane is a 66 y.o. male 23-Jan-1951 [id number]  Subjective: No new complaints. No new problems. Slept well. Feeling OK.  Objective: Vital signs in last 24 hours: Temp:  [97.4 F (36.3 C)-97.8 F (36.6 C)] 97.4 F (36.3 C) (03/18 0453) Pulse Rate:  [78-98] 98 (03/18 1013) Resp:  [16-18] 18 (03/18 0453) BP: (102-135)/(61-71) 135/71 (03/18 1013) SpO2:  [100 %] 100 % (03/18 0453) Weight change:  Last BM Date: 07/10/16  Intake/Output from previous day: 03/17 0701 - 03/18 0700 In: 960 [P.O.:960] Out: 300 [Urine:300] Last cbgs: CBG (last 3)   Recent Labs  07/09/16 1641 07/09/16 2049 07/10/16 0642  GLUCAP 158* 197* 115*     Physical Exam General: No apparent distress   HEENT: not dry Lungs: Normal effort. Lungs clear to auscultation, no crackles or wheezes. Cardiovascular: Regular rate and rhythm, no edema Abdomen: S/NT/ND; BS(+) Musculoskeletal:  unchanged Neurological: No new neurological deficits Wounds: neck collar is on Skin: clear  Aging changes Mental state: Alert, oriented, cooperative    Lab Results: BMET    Component Value Date/Time   NA 133 (L) 07/05/2016 0529   K 4.0 07/05/2016 0529   CL 99 (L) 07/05/2016 0529   CO2 25 07/05/2016 0529   GLUCOSE 174 (H) 07/05/2016 0529   BUN 18 07/05/2016 0529   CREATININE 1.04 07/05/2016 0529   CALCIUM 9.0 07/05/2016 0529   GFRNONAA >60 07/05/2016 0529   GFRAA >60 07/05/2016 0529   CBC    Component Value Date/Time   WBC 9.8 07/08/2016 0531   RBC 3.97 (L) 07/08/2016 0531   HGB 11.8 (L) 07/08/2016 0531   HCT 34.7 (L) 07/08/2016 0531   PLT 406 (H) 07/08/2016 0531   MCV 87.4 07/08/2016 0531   MCH 29.7 07/08/2016 0531   MCHC 34.0 07/08/2016 0531   RDW 13.0 07/08/2016 0531   LYMPHSABS 0.0 (L) 07/05/2016 0529   MONOABS 0.0 (L) 07/05/2016 0529   EOSABS 0.0 07/05/2016 0529   BASOSABS 0.0 07/05/2016 0529    Studies/Results: No results found.  Medications: I have reviewed the patient's current  medications.  Assessment/Plan:  1. S/p cervical decompression for central cord syndrome --- CIR 2. R DVT - on Lovenox. Discussed w/pt and wife 3. Pain - Robaxin, Oxy prn 4. HTN - on Avapro, HCT, Toprol 5. DM 2 -- Metformin 6. CVA - Plavix is on hold 7. Dyslipidemia - Lipitor    Length of stay, days: 6  Walker Kehr , MD 07/10/2016, 11:46 AM

## 2016-07-10 NOTE — Progress Notes (Signed)
Refused scheduled supp at 2000. Patient reports numerous stools when given night before.Rodney Crane

## 2016-07-10 NOTE — Progress Notes (Signed)
Occupational Therapy Session Note  Patient Details  Name: Rodney Crane MRN: [id number] Date of Birth: 1950-09-20  Today's Date: 07/10/2016 OT Individual Time: 1015-1100 OT Individual Time Calculation (min): 45 min   Short Term Goals: Week 1:  OT Short Term Goal 1 (Week 1): Pt will sit unsupported to bathe and dress UB with close supervision OT Short Term Goal 2 (Week 1): Pt will transfer bed to w/c with mod A +1 in prep for ADL tasks  OT Short Term Goal 3 (Week 1): Pt will perform sit to stand for clothing management with mod A  OT Short Term Goal 4 (Week 1): Pt will perform bed mobility with mod A with bed rails in prep for ADL task  Skilled Therapeutic Interventions/Progress Updates: Therapeutic activity with focus on sit<>stand, functional mobility using RW, transfers and family education (wife present).   After setup to don TEDs, pt was escorted to Tub room for training on tub transfers, bathroom safety and discharge planning relating to performance of bathing.   Pt returned demonstration of tub bench transfer successfully with only supervision assist to sequence and contact guard while ambulating in/out of tub room.   Pt reports plan for use of standard tub/shower s/p discharge and was educated on bathroom safety recommendation to reduce fall risk to include use of suction cup grab bars and bath matt or non-skid stickers in tub.   Pt was then escorted to gym to complete functional mobility and transfer to NuStep for 5 min of LE strengthening until end of session.      Therapy Documentation Precautions:  Precautions Precautions: Cervical, Fall Required Braces or Orthoses: Cervical Brace Cervical Brace: Hard collar, At all times Restrictions Weight Bearing Restrictions: No   Vital Signs: Therapy Vitals Pulse Rate: 98 BP: 135/71   Pain: Pain Assessment Pain Assessment: 0-10 Pain Score: 2  Pain Type: Acute pain Pain Location: Neck Pain Orientation: Posterior Pain Descriptors /  Indicators: Sore Pain Onset: Gradual Pain Intervention(s): Medication (See eMAR)   ADL: ADL ADL Comments: see functional navigator   See Function Navigator for Current Functional Status.   Therapy/Group: Individual Therapy  [place] 07/10/2016, 12:12 PM

## 2016-07-10 NOTE — Progress Notes (Signed)
Continues to refuse scheduled supp.Rodney Crane A

## 2016-07-11 ENCOUNTER — Inpatient Hospital Stay (HOSPITAL_COMMUNITY): Payer: Medicare Other | Admitting: Occupational Therapy

## 2016-07-11 ENCOUNTER — Inpatient Hospital Stay (HOSPITAL_COMMUNITY): Payer: Medicare Other | Admitting: Physical Therapy

## 2016-07-11 LAB — CBC
HEMATOCRIT: 34.1 % — AB (ref 39.0–52.0)
HEMOGLOBIN: 11.5 g/dL — AB (ref 13.0–17.0)
MCH: 29.6 pg (ref 26.0–34.0)
MCHC: 33.7 g/dL (ref 30.0–36.0)
MCV: 87.9 fL (ref 78.0–100.0)
Platelets: 425 10*3/uL — ABNORMAL HIGH (ref 150–400)
RBC: 3.88 MIL/uL — ABNORMAL LOW (ref 4.22–5.81)
RDW: 13.3 % (ref 11.5–15.5)
WBC: 7.8 10*3/uL (ref 4.0–10.5)

## 2016-07-11 LAB — GLUCOSE, CAPILLARY
GLUCOSE-CAPILLARY: 133 mg/dL — AB (ref 65–99)
Glucose-Capillary: 150 mg/dL — ABNORMAL HIGH (ref 65–99)
Glucose-Capillary: 129 mg/dL — ABNORMAL HIGH (ref 65–99)
Glucose-Capillary: 134 mg/dL — ABNORMAL HIGH (ref 65–99)

## 2016-07-11 MED ORDER — BISACODYL 10 MG RE SUPP: 10.0000 mg | Freq: Every day | RECTAL | Status: DC | PRN

## 2016-07-11 NOTE — Progress Notes (Signed)
[facility] PHYSICAL MEDICINE & REHABILITATION     PROGRESS NOTE    Subjective/Complaints: Pt refusing scheduled suppository but bowels emptying with continence now. Bladder continent too now  ROS: pt denies nausea, vomiting, diarrhea, cough, shortness of breath or chest pain   Objective: Vital Signs: Blood pressure 127/77, pulse 86, temperature 97.2 F (36.2 C), temperature source Oral, resp. rate 18, height 6\' 4"  (1.93 m), weight 117 kg (258 lb), SpO2 100 %. No results found.  Recent Labs  07/11/16 0537  WBC 7.8  HGB 11.5*  HCT 34.1*  PLT 425*   No results for input(s): NA, K, CL, GLUCOSE, BUN, CREATININE, CALCIUM in the last 72 hours.  Invalid input(s): CO CBG (last 3)   Recent Labs  07/10/16 1658 07/10/16 2048 07/11/16 0627  GLUCAP 129* 138* 150*    Wt Readings from Last 3 Encounters:  07/06/16 117 kg (258 lb)  07/02/16 133.7 kg (294 lb 11.2 oz)  06/23/16 130.2 kg (287 lb)    Physical Exam:  Constitutional: He is oriented to person, place, and time. He appears well-developed.  HENT:  Head: Normocephalic and atraumatic.  Eyes: Conjunctivae and EOM are normal. Left eye exhibits no discharge.  Neck:  Cervical collar fits appropriately. Posterior neck incision is clean and dry  Cardiovascular: RRR Respiratory: CTA B  GI: Soft. Bowel sounds are normal.  ND, + bowel sounds  Musculoskeletal: He exhibits tr edema in upper and lower ext Neurological: He is alert and oriented to person, place, and time.  Motor: RUE: Shoulder abduction 4-/5, elbow flexion/extension 4/5, wrist/hand 4 to 4+ to 5/5 LUE: Shoulder abduction 4-/5, elbow flexion/extension 4/5, wrist/hand 4+ to 5/5 RLE: HF, KE 2 to 2+/5, ADF/PF 2 to 2+/5 LLE: HF, KE 3+/5, ADF/PF 4/5 Sensation diminished to light touch in hands and feet--sl improvement DTRs symmetric   Skin: Skin is warm and dry.  Psychiatric: pleasant and cooperative.    Assessment/Plan: 1. 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 Physiatrist is providing close team supervision and 24 hour management of active medical problems listed below. Physiatrist and rehab team continue to assess barriers to discharge/monitor patient progress toward functional and medical goals.  Function:  Bathing Bathing position   Position: Wheelchair/chair at sink  Bathing parts Body parts bathed by patient: Right arm, Left arm, Chest, Abdomen, Right upper leg, Left upper leg, Right lower leg, Left lower leg Body parts bathed by helper: Back  Bathing assist Assist Level: Touching or steadying assistance(Pt > 75%)      Upper Body Dressing/Undressing Upper body dressing   What is the patient wearing?: Pull over shirt/dress, Orthosis     Pull over shirt/dress - Perfomed by patient: Thread/unthread right sleeve, Thread/unthread left sleeve, Put head through opening Pull over shirt/dress - Perfomed by helper: Pull shirt over trunk     Orthosis activity level: Performed by helper  Upper body assist Assist Level: Touching or steadying assistance(Pt > 75%)      Lower Body Dressing/Undressing Lower body dressing   What is the patient wearing?: Non-skid slipper socks, Pants, AFO   Underwear - Performed by helper: Thread/unthread right underwear leg, Thread/unthread left underwear leg, Pull underwear up/down Pants- Performed by patient: Thread/unthread right pants leg, Thread/unthread left pants leg Pants- Performed by helper: Pull pants up/down   Non-skid slipper socks- Performed by helper: Don/doff right sock, Don/doff left sock     Shoes -  Performed by patient: Don/doff right shoe, Don/doff left shoe Shoes - Performed by helper: Don/doff right shoe, Don/doff left shoe   AFO - Performed by helper: Don/doff right AFO   TED Hose - Performed by helper: Don/doff right TED hose, Don/doff left TED hose   Lower body assist Assist for lower body dressing: 2 Helpers      Toileting Toileting   Toileting steps completed by patient: Adjust clothing prior to toileting Toileting steps completed by helper: Adjust clothing prior to toileting, Performs perineal hygiene, Adjust clothing after toileting Toileting Assistive Devices: Grab bar or rail  Toileting assist Assist level: Touching or steadying assistance (Pt.75%)   Transfers Chair/bed transfer   Chair/bed transfer method: Stand pivot Chair/bed transfer assist level: Touching or steadying assistance (Pt > 75%) Chair/bed transfer assistive device: Armrests, Walker Mechanical lift: Ecologist Ambulation activity did not occur: Safety/medical concerns   Max distance: 50 Assist level: Moderate assist (Pt 50 - 74%)   Wheelchair   Type: Manual Max wheelchair distance: 150 Assist Level: Supervision or verbal cues  Cognition Comprehension Comprehension assist level: Follows complex conversation/direction with no assist  Expression Expression assist level: Expresses complex ideas: With no assist  Social Interaction Social Interaction assist level: Interacts appropriately with others - No medications needed.  Problem Solving Problem solving assist level: Solves complex problems: Recognizes & self-corrects  Memory Memory assist level: Complete Independence: No helper   Medical Problem List and Plan: 1.  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  DVT Prophylaxis/Anticoagulation: Dopplers with DVT in right gastroc and left peroneal and bilateral post tib veins  -continue mg/kg lovenox  -recheck dopplers in about 1+ weeks 3. Pain Management: Robaxin and oxycodone as needed 4. Mood: Provide emotional support 5. Neuropsych: This patient is  capable of making decisions on his own behalf. 6. Skin/Wound Care: Routine skin checks 7. Fluids/Electrolytes/Nutrition:    -encourage PO 8. Acute blood loss anemia.  hgb stable 9. OSA. CPAP 10. Hypertension. HCTZ 12.5 mg daily, Toprol-XL 50 mg daily, Avapro 300 mg daily. Monitor with increased mobility 11. Diabetes mellitus with peripheral neuropathy. Hemoglobin A1c 6.0.SSI.  Check blood sugars before meals and at bedtime. Patient on Glucophage 1000 mg twice a day prior to admission.   -Resumed glucophage at 500mg  bid--- increase to home regimen  -changed diet to carb mod.     12. History of CVA. Plavix currently on hold as awaiting plan further lumbar surgery eventually (would continue to hold while on treatment dose lovenox) 13. Hyperlipidemia. Lipitor 14. Neurogenic bowel:  Bowel and bladder function improving. Dc scheduled supp 15. Tobacco abuse: Counsel   LOS (Days) 7 Morenci T, MD 07/11/2016 9:01 AM

## 2016-07-11 NOTE — Progress Notes (Signed)
Occupational Therapy Session Note  Patient Details  Name: Rodney Crane MRN: [id number] Date of Birth: 07/25/1950  Today's Date: 07/11/2016 OT Individual Time: 4332-9518 and 1300-1400 OT Individual Time Calculation (min): 73 min and 60 min   Short Term Goals: Week 1:  OT Short Term Goal 1 (Week 1): Pt will sit unsupported to bathe and dress UB with close supervision OT Short Term Goal 2 (Week 1): Pt will transfer bed to w/c with mod A +1 in prep for ADL tasks  OT Short Term Goal 3 (Week 1): Pt will perform sit to stand for clothing management with mod A  OT Short Term Goal 4 (Week 1): Pt will perform bed mobility with mod A with bed rails in prep for ADL task  Skilled Therapeutic Interventions/Progress Updates:    Session One: Pt seen for OT ADL bathing/dressing session. Pt on toilet upon arrival with wife present. Assist pt with hygiene and min A stand pivot transfer back to w/c with VCs for technique. Stand pivot with RW completed to [facility] placed inside shower, bathing with supervision, lateral leans for buttock hygne and cross ankle over knee to wash feet. He dressed seated in w/c, standing at sink with min A and assist to pull pants over helps.  He completed grooming tasks seated in w/c at sink, able to manipulate self care items with increased time. Pt left seated in w/c at end of session, all needs in reach.   Session Two: Pt seen for OT session focusing on ADL re-training and fine motor control and coordination. Pt sitting up in w/c upon arrival,  Agreeable to tx session. Pt voicing need for urination. He stood at [facility] with CGA, able to manage pants to pull down, he could grasp hand help urinal, however, due to cervical brace was unable to accurately place urinal, and assist to maintain position of urinal, min A to pull pants over brief. He self propelled w/c to therapy gym for UE strengthening. Supervision squat pivots completed throughout session. Seated EOM, completed small version pipe  tree activity focusing on in-hand manipulation and stereogenosis. Pt unable to identify pieces based on feel alone, having to compensate with vision to determine correct piece.  Addressed stereogenosis by having pt reach hand into bag full of various self care items and retrieve items based on feel alone, pt unable to discriminate items to complete task. Education and demonstration provided regarding decreased sensation and benefits of exposing hands to various textures- trialed with soft tooth brush brissels and toothed comb with pt reporting impaired sensations from R/L and through various nerve distribution areas. Pt able to correctly identfy 10/10 soft touch placements on B UEs with vision occluded. Pt returned to room at end of session total A for time and energy conservation, pt left  seated in w/c to await next therapist.  Educated throughout session regarding SCI, rate of return, decreased sensation and functional implications.   Therapy Documentation Precautions:  Precautions Precautions: Cervical, Fall Required Braces or Orthoses: Cervical Brace Cervical Brace: Hard collar, At all times Restrictions Weight Bearing Restrictions: No Pain: Pain Assessment Pain Assessment: 0-10 Pain Score: 4  Pain Type: Acute pain Pain Location: Neck Pain Descriptors / Indicators: Aching Pain Frequency: Intermittent Pain Onset: On-going Patients Stated Pain Goal: 4 Pain Intervention(s): Medication (See eMAR) (pre-medicated prior to tx sessions) ADL: ADL ADL Comments: see functional navigator  See Function Navigator for Current Functional Status.   Therapy/Group: Individual Therapy  [name], Ardell Aaronson C 07/11/2016, 7:10 AM

## 2016-07-11 NOTE — Progress Notes (Signed)
Occupational Therapy Session Note  Patient Details  Name: Rodney Crane MRN: [id number] Date of Birth: [birth year]  Today's Date: 07/11/2016 OT Individual Time: 2229-7989 OT Individual Time Calculation (min): 44 min    Short Term Goals: Week 1:  OT Short Term Goal 1 (Week 1): Pt will sit unsupported to bathe and dress UB with close supervision OT Short Term Goal 2 (Week 1): Pt will transfer bed to w/c with mod A +1 in prep for ADL tasks  OT Short Term Goal 3 (Week 1): Pt will perform sit to stand for clothing management with mod A  OT Short Term Goal 4 (Week 1): Pt will perform bed mobility with mod A with bed rails in prep for ADL task   Skilled Therapeutic Interventions/Progress Updates:    Upon entering the room, pt seated in wheelchair with c/o of fatigue and declining ambulation and functional transfers this session. His wife arrived while OT present. OT provided information via paper handouts and verbal education regarding energy conservation for self care and IADL tasks. Pt and caregiver engaged in conversation and asked questions as appropriate. Education to continue. Pt remained in wheelchair at end of session call bell and all needed items within reach.   Therapy Documentation Precautions:  Precautions Precautions: Cervical, Fall Required Braces or Orthoses: Cervical Brace Cervical Brace: Hard collar, At all times Restrictions Weight Bearing Restrictions: No General:   Vital Signs: Therapy Vitals Temp: 97.7 F (36.5 C) Temp Source: Oral Pulse Rate: 76 Resp: 18 BP: (!) 113/56 Patient Position (if appropriate): Sitting Oxygen Therapy SpO2: 99 % O2 Device: Not Delivered Pain: Pain Assessment Pain Assessment: 0-10 Pain Score: 4  ADL: ADL ADL Comments: see functional navigator Exercises:   Other Treatments:    See Function Navigator for Current Functional Status.   Therapy/Group: Individual Therapy  Gypsy Decant 07/11/2016, 5:17 PM

## 2016-07-11 NOTE — Progress Notes (Signed)
Patient stated he can place self on CPAP, just to turn machine on. Family at bedside.

## 2016-07-11 NOTE — Progress Notes (Signed)
Physical Therapy Session Note  Patient Details  Name: Rodney Crane MRN: [id number] Date of Birth: Oct 12, 1950  Today's Date: 07/11/2016 PT Individual Time: 1000-1100 PT Individual Time Calculation (min): 60 min   Short Term Goals: Week 1:  PT Short Term Goal 1 (Week 1): Pt will demonstrated bed mobility minA PT Short Term Goal 2 (Week 1): Pt will transfer modA w/c <>bed  PT Short Term Goal 3 (Week 1): Pt will perform sit <>stand minA PT Short Term Goal 4 (Week 1): Pt will propel w/c BUE with minA PT Short Term Goal 5 (Week 1): Pt will initiate gait training  Skilled Therapeutic Interventions/Progress Updates: Pt received seated in w/c, c/o pain as below in B shoulders and agreeable to treatment. W/c propulsion x150' with BUE for strengthening and endurance. Gait x60', x80', x90' with RW, RLE orthosis and min guard; improving gait speed and RLE foot clearance. Ambulatory transfer w/c <>flat bed in ADL apartment with RW and close S; one LOB posteriorly when standing up from bed requiring minA to recover. Performed bed mobility to flat bed with S and increased time. Discussed positioning in bed for comfort, use of extra pillows to prop upper body/head up. Furniture transfer min guard d/t low seat height. Car transfer performed min guard with RW. Gait on ramp and uneven mulch surface with min guard and RW. Sit >stand from w/c at scale per pt request to weigh himself; minA overall. Remained seated in w/c at end of session, all needs in reach.      Therapy Documentation Precautions:  Precautions Precautions: Cervical, Fall Required Braces or Orthoses: Cervical Brace Cervical Brace: Hard collar, At all times Restrictions Weight Bearing Restrictions: No Pain: Pain Assessment Pain Assessment: 0-10 Pain Score: 4   See Function Navigator for Current Functional Status.   Therapy/Group: Individual Therapy  [name] 07/11/2016, 11:02 AM

## 2016-07-12 ENCOUNTER — Inpatient Hospital Stay (HOSPITAL_COMMUNITY): Payer: Medicare Other | Admitting: Occupational Therapy

## 2016-07-12 ENCOUNTER — Inpatient Hospital Stay (HOSPITAL_COMMUNITY): Payer: Medicare Other

## 2016-07-12 ENCOUNTER — Inpatient Hospital Stay (HOSPITAL_COMMUNITY): Payer: Medicare Other | Admitting: Physical Therapy

## 2016-07-12 LAB — GLUCOSE, CAPILLARY
GLUCOSE-CAPILLARY: 116 mg/dL — AB (ref 65–99)
GLUCOSE-CAPILLARY: 167 mg/dL — AB (ref 65–99)
Glucose-Capillary: 142 mg/dL — ABNORMAL HIGH (ref 65–99)
Glucose-Capillary: 162 mg/dL — ABNORMAL HIGH (ref 65–99)

## 2016-07-12 NOTE — Plan of Care (Signed)
Problem: RH Stairs Goal: LTG Patient will ambulate up and down stairs w/assist (PT) LTG: Patient will ambulate up and down # of stairs with assistance (PT) Goal added d/t progress

## 2016-07-12 NOTE — Progress Notes (Signed)
ANTICOAGULATION CONSULT NOTE - Follow Up Consult  Pharmacy Consult for lovenox Indication: DVT  No Known Allergies  Patient Measurements: Height: 6\' 4"  (193 cm) Weight: 258 lb (117 kg) IBW/kg (Calculated) : 86.8 Heparin Dosing Weight:   Vital Signs: Temp: 98.6 F (37 C) (03/20 0425) Temp Source: Oral (03/20 0425) BP: 131/67 (03/20 0425) Pulse Rate: 81 (03/20 0425)  Labs:  Recent Labs  07/11/16 0537  HGB 11.5*  HCT 34.1*  PLT 425*    Estimated Creatinine Clearance: 97.7 mL/min (by C-G formula based on SCr of 1.04 mg/dL).   Medications:  Scheduled:  . atorvastatin  20 mg Oral q1800  . enoxaparin (LOVENOX) injection  1 mg/kg Subcutaneous Q12H  . hydrochlorothiazide  12.5 mg Oral Daily  . insulin aspart  0-20 Units Subcutaneous TID AC & HS  . irbesartan  300 mg Oral Daily  . metFORMIN  500 mg Oral BID WC  . metoprolol succinate  50 mg Oral Daily  . pantoprazole  40 mg Oral q1800  . polyethylene glycol  17 g Oral Daily  . senna-docusate  2 tablet Oral Q0600   Infusions:    Assessment: 66 yo male with DVT is currently on treatment dose of lovenox.  CBC on 07/11/16 was stable.  Goal of Therapy:  Anti-Xa level 0.6-1 units/ml 4hrs after LMWH dose given Monitor platelets by anticoagulation protocol: Yes   Plan:  Lovenox 1 mg/kg q12h Monitor renal fx, cbc F/u repeat Dopper in ~1 wk or transitioning to PO anticoag  Rodney Crane, Tsz-Yin 07/12/2016,8:20 AM

## 2016-07-12 NOTE — Progress Notes (Signed)
Physical Therapy Session Note  Patient Details  Name: Rodney Crane MRN: [id number] Date of Birth: 09-04-1950  Today's Date: 07/12/2016 PT Individual Time: 1000-1030 PT Individual Time Calculation (min): 30 min   Short Term Goals: Week 1:  PT Short Term Goal 1 (Week 1): Pt will demonstrated bed mobility minA PT Short Term Goal 2 (Week 1): Pt will transfer modA w/c <>bed  PT Short Term Goal 3 (Week 1): Pt will perform sit <>stand minA PT Short Term Goal 4 (Week 1): Pt will propel w/c BUE with minA PT Short Term Goal 5 (Week 1): Pt will initiate gait training  Skilled Therapeutic Interventions/Progress Updates: Pt received seated in w/c, denies pain and agreeable to treatment. Sit >stand minA from w/c on first trial d/t "not being warmed up; reduced to min guard with repetition. Gait 2x80' with RW and min guard; occasional scissoring with RLE, and poor R foot clearance. Educated pt on role of hip strength and coordination for stabilizing pelvis during gait, reducing scissoring. Standing hip abduction 2x10 reps each side, standing pelvic elevation for R hip abduction strengthening x10 reps. Returned to room with BLE w/c propulsion for strengthening and coordination. Remained seated in w/c at end of session, all needs in reach.      Therapy Documentation Precautions:  Precautions Precautions: Cervical, Fall Required Braces or Orthoses: Cervical Brace Cervical Brace: Hard collar, At all times Restrictions Weight Bearing Restrictions: No Pain: Pain Assessment Pain Score: 0-No pain  See Function Navigator for Current Functional Status.   Therapy/Group: Individual Therapy  [name] 07/12/2016, 10:29 AM

## 2016-07-12 NOTE — Plan of Care (Signed)
Problem: RH Ambulation Goal: LTG Patient will ambulate in controlled environment (PT) LTG: Patient will ambulate in a controlled environment, # of feet with assistance (PT).  Upgraded d/t progress Goal: LTG Patient will ambulate in home environment (PT) LTG: Patient will ambulate in home environment, # of feet with assistance (PT).  Upgraded d/t progress   

## 2016-07-12 NOTE — Progress Notes (Signed)
Occupational Therapy Weekly Progress Note  Patient Details  Name: Rodney Crane MRN: [id number] Date of Birth: May 31, 1950  Beginning of progress report period: July 05, 2016 End of progress report period: July 12, 2016  Today's Date: 07/12/2016 OT Individual Time: 0730-0900 OT Individual Time Calculation (min): 90 min    Patient has met 4 of 4 short term goals. Pt making excellent progress towards OT goals. Pt demonstrates much improved functional mobility with ambulation and transfers. He cont to be limited by decreased activity tolerance and impaired sensation and coordination in B UEs. Pt will cont to benefit from skilled OT services to address deficits and cont to increase independence with ADLs/IADLs and reduce caregiver burden.   Patient continues to demonstrate the following deficits: muscle weakness and muscle paralysis, decreased cardiorespiratoy endurance and decreased standing balance and decreased balance strategies and therefore will continue to benefit from skilled OT intervention to enhance overall performance with BADL, iADL and Reduce care partner burden.  Patient progressing toward long term goals..  Plan of care revisions: Have upgraded from min A overall to supervsion. .  OT Short Term Goals Week 1:  OT Short Term Goal 1 (Week 1): Pt will sit unsupported to bathe and dress UB with close supervision OT Short Term Goal 1 - Progress (Week 1): Met OT Short Term Goal 2 (Week 1): Pt will transfer bed to w/c with mod A +1 in prep for ADL tasks  OT Short Term Goal 2 - Progress (Week 1): Met OT Short Term Goal 3 (Week 1): Pt will perform sit to stand for clothing management with mod A  OT Short Term Goal 3 - Progress (Week 1): Met OT Short Term Goal 4 (Week 1): Pt will perform bed mobility with mod A with bed rails in prep for ADL task OT Short Term Goal 4 - Progress (Week 1): Met Week 2:  OT Short Term Goal 1 (Week 2): STG=LTG due to LOS  Skilled Therapeutic  Interventions/Progress Updates:    Pt seen for OT AD bathing/dressing session. Pt sitting EOB uponn arrival eating breakfast, agreeable to tx session. He reports being pre-medicated prior to tx session. He ambulated with min A into bathroom, VCs for RW management. With steadying assist, pt able to manage clothing and completed hygiene from sitting position.  He bathed seated on BSC with set-up.  Pt voiced need to have another toilet, completed transfer on standard toilet (without BSC over) and completed sit <> Stands with guarding assist, VCs for hand placement on grab bars and RW. He dressed seated in w/c, standing at RW with steadying assist to pull pants up. With increased time able to manage shoes and AFO with VCs for problem solving/ positioning. Pt left seated in w/c at end of session, all needs in reach.   Therapy Documentation Precautions:  Precautions Precautions: Cervical, Fall Required Braces or Orthoses: Cervical Brace Cervical Brace: Hard collar, At all times Restrictions Weight Bearing Restrictions: No ADL: ADL ADL Comments: see functional navigator  See Function Navigator for Current Functional Status.   Therapy/Group: Individual Therapy  [name], Leala Bryand C 07/12/2016, 7:07 AM

## 2016-07-12 NOTE — Progress Notes (Signed)
RT set up patient CPAP. Patient is able to place self on CPAP without assistance.

## 2016-07-12 NOTE — Progress Notes (Signed)
Occupational Therapy Session Note  Patient Details  Name: Rodney Crane MRN: [id number] Date of Birth: Feb 24, 1951  Today's Date: 07/12/2016 OT Individual Time: 1445-1515 OT Individual Time Calculation (min): 30 min    Short Term Goals: Week 2:  OT Short Term Goal 1 (Week 2): STG=LTG due to LOS  Skilled Therapeutic Interventions/Progress Updates:    Upon entering the room, pt seated in wheelchair with no c/o pain but c/o fatigue. However, pt is agreeable to OT intervention. Pt propelled wheelchair with supervision and increased time 100' to day room. OT demonstrated transfer from wheelchair > standard rocking recliner chair similar to home environment. Pt management wheelchair leg rest and set up with increased time before standing with RW and ambulating with steady assistance for transfer. Pt needing steady assist overall with sit <>stand from low, rocking recliner chair. Pt returned to wheelchair in same manner and back to room. Pt remained in wheelchair at end of session with wife present in the room. Call bell and all needed items within reach.   Therapy Documentation Precautions:  Precautions Precautions: Cervical, Fall Required Braces or Orthoses: Cervical Brace Cervical Brace: Hard collar, At all times Restrictions Weight Bearing Restrictions: No General:   Vital Signs: Therapy Vitals Temp: 98.7 F (37.1 C) Temp Source: Oral Pulse Rate: 76 Resp: 18 BP: 120/71 Patient Position (if appropriate): Sitting Oxygen Therapy SpO2: 100 % O2 Device: Not Delivered Pain:   ADL: ADL ADL Comments: see functional navigator Exercises:   Other Treatments:    See Function Navigator for Current Functional Status.   Therapy/Group: Individual Therapy  Gypsy Decant 07/12/2016, 5:07 PM

## 2016-07-12 NOTE — Progress Notes (Signed)
Occupational Therapy Note  Patient Details  Name: Rodney Crane MRN: [id number] Date of Birth: 1951/03/27  Today's Date: 07/12/2016 OT Individual Time: 1100-1200 OT Individual Time Calculation (min): 60 min   Pt denied pain Individual therapy  Pt propelled w/c to therapy gym and initially engaged in BUE [facility] tasks while standing with multiple rest breaks.  Pt transitioned to engaging in [facility] tasks while seated.  Tasks included removing beads from theraputty, placing clothes pin on dowel, and stacking tokens.  Pt continues to exhibit decreased sensation.  Pt completed all tasks with more than a reasonable amount of time.  Pt returned to room and remained in w/c with all needs within reach.    Leotis Shames [facility] 07/12/2016, 12:10 PM

## 2016-07-12 NOTE — Progress Notes (Signed)
Social Work Patient ID: Rodney Crane, male   DOB: [birth year], 66 y.o.   MRN: [id number]  Met with pt to update team conference goals supervision level and target discharge 3/28. He is pleased with his progress and will let His wife know about the discharge date next week.

## 2016-07-12 NOTE — Plan of Care (Signed)
Problem: RH Car Transfers Goal: LTG Patient will perform car transfers with assist (PT) LTG: Patient will perform car transfers with assistance (PT).  Upgraded d/t progress

## 2016-07-12 NOTE — Plan of Care (Signed)
Problem: RH Bathing Goal: LTG Patient will bathe with assist, cues/equipment (OT) LTG: Patient will bathe specified number of body parts with assist with/without cues using equipment (position)  (OT)  Upgraded due to pt progress 3/20- AL  Problem: RH Toilet Transfers Goal: LTG Patient will perform toilet transfers w/assist (OT) LTG: Patient will perform toilet transfers with assist, with/without cues using equipment (OT)  Upgraded due to pt progress.- AL 3/20  Problem: RH Tub/Shower Transfers Goal: LTG Patient will perform tub/shower transfers w/assist (OT) LTG: Patient will perform tub/shower transfers with assist, with/without cues using equipment (OT)  Upgraded due to pt progress- AL 3/20

## 2016-07-12 NOTE — Progress Notes (Signed)
[facility]     PROGRESS NOTE    Subjective/Complaints: Pt up on toilet. Has sense he needs to empty his bowels. Has backed off miralax and feels that he may need to resume. Having spontaneous voids.  ROS: pt denies nausea, vomiting, diarrhea, cough, shortness of breath or chest pain    Objective: Vital Signs: Blood pressure 131/67, pulse 81, temperature 98.6 F (37 C), temperature source Oral, resp. rate 18, height 6\' 4"  (1.93 m), weight 117 kg (258 lb), SpO2 100 %. No results found.  Recent Labs  07/11/16 0537  WBC 7.8  HGB 11.5*  HCT 34.1*  PLT 425*   No results for input(s): NA, K, CL, GLUCOSE, BUN, CREATININE, CALCIUM in the last 72 hours.  Invalid input(s): CO CBG (last 3)   Recent Labs  07/11/16 1636 07/11/16 2213 07/12/16 0642  GLUCAP 134* 133* 116*    Wt Readings from Last 3 Encounters:  07/06/16 117 kg (258 lb)  07/02/16 133.7 kg (294 lb 11.2 oz)  06/23/16 130.2 kg (287 lb)    Physical Exam:  Constitutional: He is oriented to person, place, and time. He appears well-developed.  HENT:  Head: Normocephalic and atraumatic.  Eyes: Conjunctivae and EOM are normal. Left eye exhibits no discharge.  Neck:  Posterior neck incision is clean and dry--dressing soiled  Cardiovascular: RRR Respiratory: CTA B GI: Soft. Bowel sounds are normal.  ND, + bowel sounds  Musculoskeletal: He exhibits tr edema in upper and lower ext Neurological: He is alert and oriented to person, place, and time.  Motor: RUE: Shoulder abduction 4-/5, elbow flexion/extension 4/5, wrist/hand 4 to 4+ to 5/5 LUE: Shoulder abduction 4-/5, elbow flexion/extension 4/5, wrist/hand 4+ to 5/5 RLE: HF, KE 2 to 2+/5, ADF/PF 2+ to 3-/5 LLE: HF, KE 3+/5, ADF/PF 4/5 Sensation diminished to light touch in hands and feet--stable DTRs symmetric   Skin: Skin is warm and dry.  Psychiatric: pleasant and cooperative.    Assessment/Plan: 1. 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 Physiatrist is providing close team supervision and 24 hour management of active medical problems listed below. Physiatrist and rehab team continue to assess barriers to discharge/monitor patient progress toward functional and medical goals.  Function:  Bathing Bathing position   Position: Shower  Bathing parts Body parts bathed by patient: Right arm, Left arm, Chest, Abdomen, Right upper leg, Left upper leg, Right lower leg, Left lower leg, Front perineal area, Buttocks, Back Body parts bathed by helper: Back  Bathing assist Assist Level: Supervision or verbal cues      Upper Body Dressing/Undressing Upper body dressing   What is the patient wearing?: Pull over shirt/dress, Orthosis     Pull over shirt/dress - Perfomed by patient: Thread/unthread right sleeve, Thread/unthread left sleeve, Put head through opening, Pull shirt over trunk Pull over shirt/dress - Perfomed by helper: Pull shirt over trunk     Orthosis activity level: Performed by helper  Upper body assist Assist Level: Set up   Set up : To obtain clothing/put away, To apply TLSO, cervical collar  Lower Body Dressing/Undressing Lower body dressing   What is the patient wearing?: Pants, Shoes, AFO, Ted Hose   Underwear - Performed by helper: Thread/unthread right underwear leg, Thread/unthread left underwear leg, Pull underwear up/down Pants- Performed by patient: Thread/unthread right pants leg, Thread/unthread left pants leg Pants- Performed by helper: Pull pants up/down  Non-skid slipper socks- Performed by helper: Don/doff right sock, Don/doff left sock     Shoes - Performed by patient: Don/doff right shoe, Don/doff left shoe, Fasten right, Fasten left Shoes - Performed by helper: Don/doff right shoe, Don/doff left shoe AFO - Performed by patient: Don/doff right  AFO AFO - Performed by helper: Don/doff right AFO   TED Hose - Performed by helper: Don/doff right TED hose, Don/doff left TED hose  Lower body assist Assist for lower body dressing: Touching or steadying assistance (Pt > 75%)      Toileting Toileting   Toileting steps completed by patient: Adjust clothing prior to toileting, Performs perineal hygiene Toileting steps completed by helper: Adjust clothing after toileting Toileting Assistive Devices: Grab bar or rail  Toileting assist Assist level: Touching or steadying assistance (Pt.75%)   Transfers Chair/bed transfer   Chair/bed transfer method: Stand pivot Chair/bed transfer assist level: Touching or steadying assistance (Pt > 75%) Chair/bed transfer assistive device: Armrests, Walker Mechanical lift: Ecologist Ambulation activity did not occur: Safety/medical concerns   Max distance: 90 Assist level: Touching or steadying assistance (Pt > 75%)   Wheelchair   Type: Manual Max wheelchair distance: 150 Assist Level: Supervision or verbal cues  Cognition Comprehension Comprehension assist level: Follows complex conversation/direction with no assist  Expression Expression assist level: Expresses complex ideas: With no assist  Social Interaction Social Interaction assist level: Interacts appropriately with others - No medications needed.  Problem Solving Problem solving assist level: Solves complex problems: Recognizes & self-corrects  Memory Memory assist level: Complete Independence: No helper   Medical Problem List and Plan: 1.  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  DVT Prophylaxis/Anticoagulation: Dopplers with DVT in right gastroc and left peroneal and bilateral post tib veins  -continue mg/kg lovenox  -recheck  dopplers next week 3. Pain Management: Robaxin and oxycodone as needed 4. Mood: Provide emotional support 5. Neuropsych: This patient is capable of making decisions on his own behalf. 6. Skin/Wound Care: Routine skin checks 7. Fluids/Electrolytes/Nutrition:    -encourage PO 8. Acute blood loss anemia.  hgb stable 9. OSA. CPAP 10. Hypertension. HCTZ 12.5 mg daily, Toprol-XL 50 mg daily, Avapro 300 mg daily. Monitor with increased mobility 11. Diabetes mellitus with peripheral neuropathy. Hemoglobin A1c 6.0.SSI.  Check blood sugars before meals and at bedtime. Patient on Glucophage 1000 mg twice a day prior to admission.   -Resumed glucophage at 1000mg  bid  -diet carb mod.     12. History of CVA. Plavix currently on hold as awaiting plan further lumbar surgery eventually (would continue to hold while on treatment dose lovenox) 13. Hyperlipidemia. Lipitor 14. Neurogenic bowel:  Bowel and bladder function improving. Dc scheduled supp.   -I will leave miralax prn  -continued scheduled senokot-s 15. Tobacco abuse: Counsel   LOS (Days) Pocahontas T, MD 07/12/2016 8:50 AM

## 2016-07-12 NOTE — Progress Notes (Signed)
Physical Therapy Session Note  Patient Details  Name: Rodney Crane MRN: [id number] Date of Birth: Dec 28, 1950  Today's Date: 07/12/2016 PT Individual Time: 1332-1400 PT Individual Time Calculation (min): 28 min   Short Term Goals: Week 1:  PT Short Term Goal 1 (Week 1): Pt will demonstrated bed mobility minA PT Short Term Goal 2 (Week 1): Pt will transfer modA w/c <>bed  PT Short Term Goal 3 (Week 1): Pt will perform sit <>stand minA PT Short Term Goal 4 (Week 1): Pt will propel w/c BUE with minA PT Short Term Goal 5 (Week 1): Pt will initiate gait training  Skilled Therapeutic Interventions/Progress Updates:    w/c propulsion down to therapy gym for functional UE strengthening and coordination. Functional squat pivot transfers during session x 2 with overall supervision with cues for technique to manipulate leg rests but pt able to doff them. Neuro re-ed during sit <> stands without RW (minimal need for BUE to push up from surface) to address graded movement, postural control in standing, and balance reactions in standing with overall steadying assist and facilitation for weightshift and upright posture x 10 reps. Handout given for functional LE strengthening exercises that were verbally reviewed (pt reports doing this with other therapists in prior sessions) including LAQ, hip abduction, hamstring curls and seated heel/toe raises. Also added sit <> stands to HEP as well. Pt making excellent progress towards goals.   Therapy Documentation Precautions:  Precautions Precautions: Cervical, Fall Required Braces or Orthoses: Cervical Brace Cervical Brace: Hard collar, At all times Restrictions Weight Bearing Restrictions: No   Pain:  Denies pain. Reports general fatigue today.   See Function Navigator for Current Functional Status.   Therapy/Group: Individual Therapy  Canary Brim Ivory Broad, PT, DPT  07/12/2016, 2:06 PM

## 2016-07-13 ENCOUNTER — Inpatient Hospital Stay (HOSPITAL_COMMUNITY): Payer: Medicare Other | Admitting: Physical Therapy

## 2016-07-13 ENCOUNTER — Inpatient Hospital Stay (HOSPITAL_COMMUNITY): Payer: Medicare Other | Admitting: Occupational Therapy

## 2016-07-13 LAB — GLUCOSE, CAPILLARY
GLUCOSE-CAPILLARY: 113 mg/dL — AB (ref 65–99)
Glucose-Capillary: 123 mg/dL — ABNORMAL HIGH (ref 65–99)
Glucose-Capillary: 109 mg/dL — ABNORMAL HIGH (ref 65–99)
Glucose-Capillary: 155 mg/dL — ABNORMAL HIGH (ref 65–99)

## 2016-07-13 NOTE — Progress Notes (Signed)
Occupational Therapy Session Note  Patient Details  Name: Rodney Crane MRN: [id number] Date of Birth: [birth year]  Today's Date: 07/13/2016 OT Individual Time: 1100-1200 OT Individual Time Calculation (min): 60 min    Short Term Goals: Week 1:  OT Short Term Goal 1 (Week 1): Pt will sit unsupported to bathe and dress UB with close supervision OT Short Term Goal 1 - Progress (Week 1): Met OT Short Term Goal 2 (Week 1): Pt will transfer bed to w/c with mod A +1 in prep for ADL tasks  OT Short Term Goal 2 - Progress (Week 1): Met OT Short Term Goal 3 (Week 1): Pt will perform sit to stand for clothing management with mod A  OT Short Term Goal 3 - Progress (Week 1): Met OT Short Term Goal 4 (Week 1): Pt will perform bed mobility with mod A with bed rails in prep for ADL task OT Short Term Goal 4 - Progress (Week 1): Met Week 2:  OT Short Term Goal 1 (Week 2): STG=LTG due to LOS  Skilled Therapeutic Interventions/Progress Updates:    Pt seen this session to facilitate strength and balance needed for standing at sink for grooming and clothing management for LB dressing.  Pt discussed that he gets tired when standing but knows a w/c will not fit well into his bathroom at home. Suggested a sturdy kitchen chair placed adjacent to vanity. He could use vanity for support if needed with sit to stand. Pt propelled w/c to gym by using legs and arms to advance chair.  In gym, used RW to ambulate to mat. From slightly elevated mat, he practiced sit >< stand with hands on thighs 8x in a row with slight steadying A to guide movement.  He completed 4 sets alternating with UE rowing exercises of 40 reps at a time with a 4# dowel bar.   In standing, RW placed in front of him with pt reaching for cones in front and then placing them behind him on mat with small knee bends to center his gravity.  Pt obviously could not turn his head, so he practiced using his legs to assist with small trunk rotation.  This is same  movement pattern he will need for clothing management with LB dressing.  Pt tolerated standing for 2 minutes at a time during these exercises.    Mat elevated to bar stool height. Pt worked on wt shifts on feet to facilitate quad strength as a stabilizer. Pt scooted back on mat and with feet off the floor, wt shifts on hips to work on core strength.  Slid forward and ambulated to wc with RW.  Pt taken back to room so he could toilet. Once on toilet, pt stated it would take him awhile and stated he would use the call light for nursing when he was finished.    Therapy Documentation Precautions:  Precautions Precautions: Cervical, Fall Required Braces or Orthoses: Cervical Brace Cervical Brace: Hard collar, At all times Restrictions Weight Bearing Restrictions: No   Pain: Pain Assessment Pain Assessment: 0-10 Pain Score: 5  Pain Type: Acute pain Pain Location: Neck Pain Orientation: Posterior Pain Descriptors / Indicators: Aching Pain Frequency: Intermittent Pain Onset: On-going Patients Stated Pain Goal: 0 Pain Intervention(s): Medication (See eMAR) Multiple Pain Sites: No ADL: ADL ADL Comments: see functional navigator  See Function Navigator for Current Functional Status.   Therapy/Group: Individual Therapy  Makaleigh Reinard 07/13/2016, 10:11 AM

## 2016-07-13 NOTE — Progress Notes (Signed)
07/13/2016- Respiratory care note- CPAP at bedside- Pt using on own. Tolerating well.

## 2016-07-13 NOTE — Progress Notes (Signed)
Physical Therapy Weekly Progress Note  Patient Details  Name: Rodney Crane MRN: [id number] Date of Birth: November 03, 1950  Beginning of progress report period: July 05, 2016 End of progress report period: July 13, 2016  Today's Date: 07/13/2016 PT Individual Time: 1300-1400 PT Individual Time Calculation (min): 60 min   Patient has met 4 of 4 short term goals.  Pt currently requires S for bed mobility, min/modA for sit <>stand and minA for gait with rolling walker for up to 90' per trial. Pt with significant improvements in strength and coordination since eval, and is motivated to continue improving to prepare for d/c home with wife. Wife has been present for parts of several therapy sessions and has been checked off for assisting pt with transfers in the room. Recommend formal family education closer to d/c to discuss recommendations and home safety.   Patient continues to demonstrate the following deficits muscle weakness, decreased cardiorespiratoy endurance, unbalanced muscle activation and decreased coordination and decreased standing balance, decreased postural control and decreased balance strategies and therefore will continue to benefit from skilled PT intervention to increase functional independence with mobility.  Patient progressing toward long term goals..  Plan of care revisions: goals upgraded to S overall d/t progress.  PT Short Term Goals Week 1:  PT Short Term Goal 1 (Week 1): Pt will demonstrated bed mobility minA PT Short Term Goal 1 - Progress (Week 1): Met PT Short Term Goal 2 (Week 1): Pt will transfer modA w/c <>bed  PT Short Term Goal 2 - Progress (Week 1): Met PT Short Term Goal 3 (Week 1): Pt will perform sit <>stand minA PT Short Term Goal 3 - Progress (Week 1): Met PT Short Term Goal 4 (Week 1): Pt will propel w/c BUE with minA PT Short Term Goal 4 - Progress (Week 1): Met PT Short Term Goal 5 (Week 1): Pt will initiate gait training PT Short Term Goal 5 -  Progress (Week 1): Met Week 2:  PT Short Term Goal 1 (Week 2): =LTG due to estimated LOS  Skilled Therapeutic Interventions/Progress Updates: Pt received seated in w/c, denies pain and agreeable to treatment. Gait to gym x150' with RW and min guard. Sit <>stand 2x10 reps from mat table with light UE support. Sidelying hip abduction clamshell 2x10 reps BLE; increased excursion and coordination L compared to R. Stairs ascent x8 3" stairs, descent x4 6" stairs with mod LOB on descent due to higher step height and poor RLE eccentric control. 5 reps LLE lateral step ups on 6" step before fatigued. 1x15 reps LLE lateral step ups on 3" step, 2x10 RLE with tactile cues for powerful propulsion and eccentric control. Discussed recommendation with pt that if he is fatigued later in the day that he should perform stand pivot and squat pivot transfers to toilet as opposed to ambulating to reduce risk of buckling/fall; pt verbalizes understanding. W/c propulsion BLE to return to room for LE strengthening and coordination. Remained seated in w/c at end of session, all needs in reach and wife present.      Therapy Documentation Precautions:  Precautions Precautions: Cervical, Fall Required Braces or Orthoses: Cervical Brace Cervical Brace: Hard collar, At all times Restrictions Weight Bearing Restrictions: No  See Function Navigator for Current Functional Status.  Therapy/Group: Individual Therapy  [name] 07/13/2016, 2:41 PM

## 2016-07-13 NOTE — Progress Notes (Signed)
Occupational Therapy Session Note  Patient Details  Name: Rodney Crane MRN: [id number] Date of Birth: 07-04-1950  Today's Date: 07/13/2016 OT Individual Time: 6333-5456 OT Individual Time Calculation (min): 75 min    Short Term Goals: Week 2:  OT Short Term Goal 1 (Week 2): STG=LTG due to LOS  Skilled Therapeutic Interventions/Progress Updates:    Pt seen for OT ADL bathing/dressing session. Pt sitting up in w/c upon arrival, washing up at the sink.He stood at sink with steadying assist to complete pericare hygiene. HE dressed seated in w/c, standing at RW to pull pants up with close supervision, increased time for donning shoes and AFO.  He ambulated 169 ft to therapy gym with CGA and VCs for less reliance on UEs and wider BOS. Seated on therapy mat, pt completed medium size peg board activity addressing fine motor skills and in hand manipulation- completed with increased time. Pt notes L hand improving quicker in regards to return of sensation and coordination.  Pt self propelled w/c back to room at end of session, left seated in w/c with all needs in reach.  Throughout session, discussed d/c planning and DME.   Therapy Documentation Precautions:  Precautions Precautions: Cervical, Fall Required Braces or Orthoses: Cervical Brace Cervical Brace: Hard collar, At all times Restrictions Weight Bearing Restrictions: No Pain:   No/ denies pain- pre-medicated prior to tx session.  ADL: ADL ADL Comments: see functional navigator  See Function Navigator for Current Functional Status.   Therapy/Group: Individual Therapy  [name], Liyana Suniga C 07/13/2016, 7:07 AM

## 2016-07-13 NOTE — Progress Notes (Signed)
Physical Therapy Session Note  Patient Details  Name: Rodney Crane MRN: [id number] Date of Birth: 1951/04/19  Today's Date: 07/13/2016 PT Individual Time: 1545-1630 PT Individual Time Calculation (min): 45 min   Short Term Goals: Week 2:  PT Short Term Goal 1 (Week 2): =LTG due to estimated LOS  Skilled Therapeutic Interventions/Progress Updates:    no c/o pain but reports fatigue.  Session focus on NMR via forced use and multimodal cues.    Pt propelled w/c to therapy gym with BUEs for mobility and cardiovascular endurance.  Tall kneeling x10 minutes with 2x10 reps reaching to R with LUE and reaching to R with RUE to facilitate weight shift and forced weight bearing for R hip stability.  Pt completed 2x10 reps minisquats in tall kneeling with rest break in between for terminal hip extension and graded movement.  UE coordination task during rest break retrieving beads from tan theraputty.  Nustep x10 minutes with 4 extremities at level 5 for reciprocal stepping pattern retraining and activity tolerance.  PT propelled w/c back to room at end of session for energy conservation and pt positioned to comfort with call bell in reach and needs met.   Therapy Documentation Precautions:  Precautions Precautions: Cervical, Fall Required Braces or Orthoses: Cervical Brace Cervical Brace: Hard collar, At all times Restrictions Weight Bearing Restrictions: No   See Function Navigator for Current Functional Status.   Therapy/Group: Individual Therapy  Rodney Crane 07/13/2016, 4:50 PM

## 2016-07-13 NOTE — Progress Notes (Signed)
[facility]YS[facility]ICINE & REHABILITATION     PROGRESS NOTE    Subjective/Complaints: Feeling well. Emptying bowels and bladder with more voluntarily and with continence.   ROS: pt denies nausea, vomiting, diarrhea, cough, shortness of breath or chest pain     Objective: Vital Signs: Blood pressure 122/68, pulse 76, temperature 97.9 F (36.6 C), temperature source Oral, resp. rate 18, height 6\' 4"  (1.93 m), weight 117 kg (258 lb), SpO2 100 %. No results found.  Recent Labs  07/11/16 0537  WBC 7.8  HGB 11.5*  HCT 34.1*  PLT 425*   No results for input(s): NA, K, CL, GLUCOSE, BUN, CREATININE, CALCIUM in the last 72 hours.  Invalid input(s): CO CBG (last 3)   Recent Labs  07/12/16 2059 07/13/16 0655 07/13/16 1211  GLUCAP 167* 113* 123*    Wt Readings from Last 3 Encounters:  07/06/16 117 kg (258 lb)  07/02/16 133.7 kg (294 lb 11.2 oz)  06/23/16 130.2 kg (287 lb)    Physical Exam:  Constitutional: He is oriented to person, place, and time. He appears well-developed.  HENT:  Head: Normocephalic and atraumatic.  Eyes: Conjunctivae and EOM are normal. Left eye exhibits no discharge.  Neck:  Posterior neck incision is clean and mostly dry with scant drainage on dressing Cardiovascular: RRR Respiratory: CTA B GI: Soft. Bowel sounds are normal.  ND, + bowel sounds  Musculoskeletal: He exhibits tr edema in upper and lower ext Neurological: He is alert and oriented to person, place, and time.  Motor: RUE: Shoulder abduction 4-/5, elbow flexion/extension 4/5, wrist/hand 4 to 4+ to 5/5 LUE: Shoulder abduction 4-/5, elbow flexion/extension 4/5, wrist/hand 4+ to 5/5 RLE: HF, KE 2 to 2+/5, ADF/PF 2+ to 3-/5 LLE: HF, KE 3+/5, ADF/PF 4/5 Sensation diminished to light touch in hands and feet--motor and sensory exam unchanged from 3/20 DTRs symmetric   Skin: Skin is warm and dry.  Psychiatric: pleasant and cooperative.    Assessment/Plan: 1. 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 Physiatrist is providing close team supervision and 24 hour management of active medical problems listed below. Physiatrist and rehab team continue to assess barriers to discharge/monitor patient progress toward functional and medical goals.  Function:  Bathing Bathing position   Position: Wheelchair/chair at sink  Bathing parts Body parts bathed by patient: Right arm, Left arm, Chest, Abdomen, Right upper leg, Left upper leg, Right lower leg, Left lower leg, Front perineal area, Buttocks, Back Body parts bathed by helper: Back  Bathing assist Assist Level: Touching or steadying assistance(Pt > 75%)   Set up : To obtain items  Upper Body Dressing/Undressing Upper body dressing   What is the patient wearing?: Pull over shirt/dress     Pull over shirt/dress - Perfomed by patient: Thread/unthread right sleeve, Thread/unthread left sleeve, Put head through opening, Pull shirt over trunk Pull over shirt/dress - Perfomed by helper: Pull shirt over trunk     Orthosis activity level: Performed by helper  Upper body assist Assist Level: Set up   Set up : To obtain clothing/put away  Lower Body Dressing/Undressing Lower body dressing   What is the patient wearing?: Pants, Shoes, AFO, Ted Hose, Underwear Underwear - Performed by patient: Thread/unthread right underwear leg, Thread/unthread left underwear leg, Pull underwear up/down Underwear - Performed by helper: Thread/unthread right underwear leg, Thread/unthread left underwear leg, Pull underwear up/down Pants- Performed by patient: Thread/unthread  right pants leg, Thread/unthread left pants leg, Pull pants up/down Pants- Performed by helper: Pull pants up/down   Non-skid slipper socks- Performed by helper: Don/doff right sock, Don/doff left sock     Shoes - Performed by patient:  Don/doff right shoe, Don/doff left shoe, Fasten right, Fasten left Shoes - Performed by helper: Don/doff right shoe, Don/doff left shoe AFO - Performed by patient: Don/doff right AFO AFO - Performed by helper: Don/doff right AFO   TED Hose - Performed by helper: Don/doff right TED hose, Don/doff left TED hose  Lower body assist Assist for lower body dressing: Supervision or verbal cues      Toileting Toileting   Toileting steps completed by patient: Adjust clothing prior to toileting Toileting steps completed by helper: Performs perineal hygiene, Adjust clothing after toileting Toileting Assistive Devices: Grab bar or rail  Toileting assist Assist level: Touching or steadying assistance (Pt.75%)   Transfers Chair/bed transfer   Chair/bed transfer method: Squat pivot Chair/bed transfer assist level: Supervision or verbal cues Chair/bed transfer assistive device: Armrests, Orthosis Mechanical lift: Stedy   Locomotion Ambulation Ambulation activity did not occur: Safety/medical concerns   Max distance: 80 Assist level: Touching or steadying assistance (Pt > 75%)   Wheelchair   Type: Manual Max wheelchair distance: 100 Assist Level: Supervision or verbal cues  Cognition Comprehension Comprehension assist level: Follows complex conversation/direction with extra time/assistive device  Expression Expression assist level: Expresses complex ideas: With extra time/assistive device  Social Interaction Social Interaction assist level: Interacts appropriately 90% of the time - Needs monitoring or encouragement for participation or interaction.  Problem Solving Problem solving assist level: Solves complex 90% of the time/cues < 10% of the time  Memory Memory assist level: Complete Independence: No helper   Medical Problem List and Plan: 1.  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 Continues to make steady functional gains 2.  DVT Prophylaxis/Anticoagulation: Dopplers with DVT in right gastroc and left peroneal and bilateral post tib veins  -continue mg/kg lovenox  -recheck dopplers Friday 3. Pain Management: Robaxin and oxycodone as needed 4. Mood: Provide emotional support 5. Neuropsych: This patient is capable of making decisions on his own behalf. 6. Skin/Wound Care: Routine skin checks 7. Fluids/Electrolytes/Nutrition:    -encourage PO 8. Acute blood loss anemia.  hgb stable 9. OSA. CPAP 10. Hypertension. HCTZ 12.5 mg daily, Toprol-XL 50 mg daily, Avapro 300 mg daily. Monitor with increased mobility 11. Diabetes mellitus with peripheral neuropathy. Hemoglobin A1c 6.0.SSI.  Check blood sugars before meals and at bedtime. Patient on Glucophage 1000 mg twice a day prior to admission.   -Resumed glucophage at 1000mg  bid with improvement in CBG's  -diet carb mod.     12. History of CVA. Plavix currently on hold as awaiting plan further lumbar surgery eventually (would continue to hold while on treatment dose lovenox) 13. Hyperlipidemia. Lipitor 14. Neurogenic bowel:  Regaining voluntary bowel control.   -miralax prn  -continued scheduled senokot-s 15. Tobacco abuse: Counsel   LOS (Days) 9 A FACE TO FACE EVALUATION WAS PERFORMED  Meredith Staggers, MD 07/13/2016 12:41 PM

## 2016-07-14 ENCOUNTER — Inpatient Hospital Stay (HOSPITAL_COMMUNITY): Payer: Medicare Other | Admitting: Physical Therapy

## 2016-07-14 ENCOUNTER — Inpatient Hospital Stay (HOSPITAL_COMMUNITY): Payer: Medicare Other | Admitting: Occupational Therapy

## 2016-07-14 ENCOUNTER — Inpatient Hospital Stay (HOSPITAL_COMMUNITY): Payer: Medicare Other

## 2016-07-14 DIAGNOSIS — M7989 Other specified soft tissue disorders: Secondary | ICD-10-CM

## 2016-07-14 LAB — CBC
HEMATOCRIT: 32.3 % — AB (ref 39.0–52.0)
HEMOGLOBIN: 10.7 g/dL — AB (ref 13.0–17.0)
MCH: 29.2 pg (ref 26.0–34.0)
MCHC: 33.1 g/dL (ref 30.0–36.0)
MCV: 88.3 fL (ref 78.0–100.0)
Platelets: 421 10*3/uL — ABNORMAL HIGH (ref 150–400)
RBC: 3.66 MIL/uL — ABNORMAL LOW (ref 4.22–5.81)
RDW: 13.1 % (ref 11.5–15.5)
WBC: 7.5 10*3/uL (ref 4.0–10.5)

## 2016-07-14 LAB — GLUCOSE, CAPILLARY
Glucose-Capillary: 126 mg/dL — ABNORMAL HIGH (ref 65–99)
Glucose-Capillary: 123 mg/dL — ABNORMAL HIGH (ref 65–99)
Glucose-Capillary: 115 mg/dL — ABNORMAL HIGH (ref 65–99)
Glucose-Capillary: 170 mg/dL — ABNORMAL HIGH (ref 65–99)

## 2016-07-14 NOTE — Progress Notes (Signed)
Occupational Therapy Session Note  Patient Details  Name: Rodney Crane MRN: [id number] Date of Birth: April 29, 1950  Today's Date: 07/14/2016 OT Individual Time: 1600-1630 OT Individual Time Calculation (min): 30 min    Short Term Goals: Week 2:  OT Short Term Goal 1 (Week 2): STG=LTG due to LOS  Skilled Therapeutic Interventions/Progress Updates:   1:1. No c/o pain. Focus of session on [facility] and calibration of movements in B hands d/t decreased sensation. Pt propels w/c to/from all therapeutic destinations with increased time and supervision. In dayroom, pt transfers water from one cup to another, practicing picking it up and pouring water out without crushing the cup with with VC to look at cup to see how much pressure is being applied ~30x each hand. Pt folds/unfolds wash cloth and rings out water to from basin to give visual cue of pressure being applied and strengthen B hands ~20x. Pt threads 15 small beads each hand onto a shoe string without dropping any beads while seated in w/c. Pt returned to room with all needs met and call light in reach.   Therapy Documentation Precautions:  Precautions Precautions: Cervical, Fall Required Braces or Orthoses: Cervical Brace Cervical Brace: Hard collar, At all times Restrictions Weight Bearing Restrictions: No Pain:   ADL: ADL ADL Comments: see functional navigator  See Function Navigator for Current Functional Status.   Therapy/Group: Individual Therapy  Tonny Branch 07/14/2016, 5:08 PM

## 2016-07-14 NOTE — Progress Notes (Signed)
VASCULAR LAB PRELIMINARY  PRELIMINARY  PRELIMINARY  PRELIMINARY   Follow Up Bilateral lower extremity venous duplex completed.    Preliminary report:  Bilateral - No evidence of new acute DVT, superficial thrombosis. Or Baker's cyst. Right DVT noted in the posterior tibial vein is still present. The gastrocnemius DVT appears to be almost resolved however the flow is sluggish. The Left Posterior tibial DVT is still present. The peroneal vein DVT difficult to image but appears resolved.  Rodney Crane, RVS 07/14/2016, 3:51 PM

## 2016-07-14 NOTE — Progress Notes (Signed)
Physical Therapy Session Note  Patient Details  Name: Rodney Crane MRN: [id number] Date of Birth: 1950/07/08  Today's Date: 07/14/2016 PT Individual Time: 1115-1200 PT Individual Time Calculation (min): 45 min   Short Term Goals: Week 2:  PT Short Term Goal 1 (Week 2): =LTG due to estimated LOS  Skilled Therapeutic Interventions/Progress Updates:    Pt up in w/c upon arrival, agreeable with PT session but reports feeling fatigued.  Ambulation: Pt able to ambulate 210 ft from room to gym and back during session using rw and supervision. Trendelenburg noted Rt>Lt, increasing with fatigue.  Balance: static standing without holding rw, progressing to staggered stance, narrow base, and eyes open/closed. With eyes closed pt having LOB posterior, and attempt8ing to utilize step back strategy.  Exercise: sit<>stand 2X5, seated hip abduction 2 X10 with resist, ball squeezed 2X10.  Pt returned to room after session, up in w/c with lunch and call light.   Therapy Documentation Precautions:  Precautions Precautions: Cervical, Fall Required Braces or Orthoses: Cervical Brace Cervical Brace: Hard collar, At all times Restrictions Weight Bearing Restrictions: No   Pain:   Denies pain.    See Function Navigator for Current Functional Status.   Therapy/Group: Individual Therapy  Cassell Clement, PT, CSCS Pager [phone] Office [phone]  07/14/2016, 12:06 PM

## 2016-07-14 NOTE — Progress Notes (Signed)
Occupational Therapy Session Note  Patient Details  Name: Rodney Crane MRN: [id number] Date of Birth: Jul 30, 1950  Today's Date: 07/14/2016 OT Individual Time: 0730-0900 OT Individual Time Calculation (min): 90 min    Short Term Goals: Week 2:  OT Short Term Goal 1 (Week 2): STG=LTG due to LOS  Skilled Therapeutic Interventions/Progress Updates:     Pt seen for OT ADL bathing/dressing session. PT sitting EOB upon arrival eating breakfast,agreeable to tx session. He was able to open all small containers indepently during eating task. He ambulated into bathroom with guarding assist, abe to manage clothing in prep for toileting task.  HE self propelled w/c to ADL apartment, ambulated into bathroom and completed tub/shower transfer utilizing tub bench in simulation of home environment. He bathed seated on bench with set-up assist and distant supervision. He dressed seated in w/c, LOB while puing pants up, however, able to safely return to chair without assist. He donned shoes and AFO with increased time, total A for TED hose. Pt returned to room in same manner as described above, left seated in w/c with all needs in reach and wife present.  Educated throughout session regarding DME, energy conservation, and d/c planning.   Therapy Documentation Precautions:  Precautions Precautions: Cervical, Fall Required Braces or Orthoses: Cervical Brace Cervical Brace: Hard collar, At all times Restrictions Weight Bearing Restrictions: No Pain:   No/ denies pain ADL: ADL ADL Comments: see functional navigator  See Function Navigator for Current Functional Status.   Therapy/Group: Individual Therapy  [name], [place] Cantero C 07/14/2016, 7:02 AM

## 2016-07-14 NOTE — Patient Care Conference (Signed)
Inpatient RehabilitationTeam Conference and Plan of Care Update Date: 07/12/2016   Time: 2:10 PM    Patient Name: Rodney Crane      Medical Record Number: [id number]  Date of Birth: 19-Aug-1950 Sex: Male         Room/Bed: 4W07C/4W07C-01 Payor Info: Payor: MEDICARE / Plan: MEDICARE PART A AND B / Product Type: *No Product type* /    Admitting Diagnosis: Central Cord Syndrome  Admit Date/Time:  07/04/2016  4:43 PM Admission Comments: No comment available   Primary Diagnosis:  Central cord syndrome (HCC) Principal Problem: Central cord syndrome [facility])  Patient Active Problem List   Diagnosis Date Noted  . Acute deep vein thrombosis (DVT) of distal vein of both lower extremities ([place]) 07/07/2016  . Abdominal distention   . Abdominal pain   . S/P cervical spinal fusion   . Type 2 diabetes mellitus with peripheral neuropathy (HCC)   . Hyperlipidemia   . Neurogenic bowel   . Myelopathy ([place])   . OSA on CPAP   . Surgery, elective   . History of CVA (cerebrovascular accident)   . Benign essential HTN   . History of cervical spinal surgery   . History of lumbar surgery   . Tobacco abuse   . Diastolic dysfunction   . Acute blood loss anemia   . Post-operative pain   . Tachycardia   . Leukocytosis   . Spinal stenosis of lumbar region   . Central cord syndrome ([place])   . Acute hypoxemic respiratory failure ([place])   . Spinal stenosis in cervical region 06/27/2016  . Cervical stenosis of spinal canal 06/25/2016  . Weakness of extremity 06/25/2016  . OSA (obstructive sleep apnea)   . Hypertension   . Diabetes ([place])   . DYSPNEA ON EXERTION 10/06/2009  . Nonspecific (abnormal) findings on radiological and other examination of body structure 10/06/2009  . CT, CHEST, ABNORMAL 10/06/2009  . COLONIC POLYPS 10/05/2009  . DIABETES, TYPE 2 10/05/2009  . HYPERLIPIDEMIA 10/05/2009  . 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 Essential hypertension 10/05/2009  . CVA 10/05/2009  . 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 Janna Arch, PsyD Social Worker Present: Ovidio Kin, LCSW Nurse Present: Elliot Cousin, RN PT Present: Canary Brim, Harriet Pho, PT OT Present: Napoleon Form, OT SLP Present: Stormy Fabian, SLP PPS Coordinator present : Daiva Nakayama, RN, CRRN     Current Status/Progress Goal Weekly Team Focus  Medical   improving motor and sensory function. having some sense of bowels and bladder although still very incomplete  improve bladder and bowel control  continue to fine tune bowel program--holding suppository currently---may need to schedule. continued education   Bowel/Bladder   Last BM 3/19. Patient continent this time.  Min assist      Swallow/Nutrition/ Hydration             ADL's   Supervision- min A overall  Upgraded to supervision overall  Functional transfers, activity tolerance, UE strengthening/ cooredination, pt/ family education, D/C planning   Mobility   S bed mobility, minA stand pivot transfers and gait with RW x90'  upgraded to S overall ambulatory in home, added stair goal at S for strengthening  gait training, activity tolerance, BUE/BLE NMR, dynamic standing balance   Communication             Safety/Cognition/ Behavioral Observations  calls appropriately, supervision.   supervision  Monitor q  shift   Pain   Back pain, on robaxin q6 and oxyir 10mg  q4.   Pain less than 2  Monitor pain q shift.   Skin   no skin breakdown. incision to posterior, CDI.  No skin breakdown while in rehab.  Assess skin q shift and prn    Rehab Goals Patient on target to meet rehab goals: Yes *See Care Plan and progress notes for long and short-term goals.  Barriers to Discharge: decreased bowel and bladder control, see prior    Possible Resolutions to Barriers:  continue NMR, continue current bowel and bladder efforts     Discharge Planning/Teaching Needs:  Home with wife who can provide supervision to light assistance.  Teaching to be planned closer to d/c.   Team Discussion:  Reg/ thin diet now.  Cont b/b.  Upgraded overall goals to supervision.  Currently min/mod to amb 90 feet.  Motor improvements.  Continue to work on b/b.  Very motivated!  Revisions to Treatment Plan:  Upgraded overall goals.   Continued Need for Acute Rehabilitation Level of Care: The patient requires daily medical management by a physician with specialized training in physical medicine and rehabilitation for the following conditions: Daily direction of a multidisciplinary physical rehabilitation program to ensure safe treatment while eliciting the highest outcome that is of practical value to the patient.: Yes Daily medical management of patient stability for increased activity during participation in an intensive rehabilitation regime.: Yes Daily analysis of laboratory values and/or radiology reports with any subsequent need for medication adjustment of medical intervention for : Post surgical problems;Neurological problems  Felicie Kocher 07/14/2016, 5:47 PM

## 2016-07-14 NOTE — Progress Notes (Signed)
[facility] PHYSICAL MEDICINE & REHABILITATION     PROGRESS NOTE    Subjective/Complaints: No new complaints. Up in ADL bathroom with OT. Improved strength and coordination   ROS: pt denies nausea, vomiting, diarrhea, cough, shortness of breath or chest pain     Objective: Vital Signs: Blood pressure 129/63, pulse 84, temperature 97.9 F (36.6 C), temperature source Oral, resp. rate 18, height 6\' 4"  (1.93 m), weight 117 kg (258 lb), SpO2 100 %. No results found.  Recent Labs  07/14/16 0504  WBC 7.5  HGB 10.7*  HCT 32.3*  PLT 421*   No results for input(s): NA, K, CL, GLUCOSE, BUN, CREATININE, CALCIUM in the last 72 hours.  Invalid input(s): CO CBG (last 3)   Recent Labs  07/13/16 1658 07/13/16 2155 07/14/16 0642  GLUCAP 109* 155* 126*    Wt Readings from Last 3 Encounters:  07/06/16 117 kg (258 lb)  07/02/16 133.7 kg (294 lb 11.2 oz)  06/23/16 130.2 kg (287 lb)    Physical Exam:  Constitutional: He is oriented to person, place, and time. He appears well-developed.  HENT:  Head: Normocephalic and atraumatic.  Eyes: Conjunctivae and EOM are normal. Left eye exhibits no discharge.  Neck:  Posterior neck incision is clean and mostly dry with scant drainage on dressing Cardiovascular: RRR Respiratory: CTA B GI: Soft. Bowel sounds are normal.  ND, + bowel sounds  Musculoskeletal: He exhibits tr edema in upper and lower ext Neurological: He is alert and oriented to person, place, and time.  Motor: RUE: Shoulder abduction 4-/5, elbow flexion/extension 4/5, wrist/hand 4 to 4+ to 5/5 LUE: Shoulder abduction 4-/5, elbow flexion/extension 4/5, wrist/hand 4+ to 5/5 RLE: HF, KE 2 to 2+/5, ADF/PF 2+ to 3-/5 LLE: HF, KE 3+/5, ADF/PF 4/5 Sensation diminished to light touch in hands and feet--motor and sensory exam unchanged from 3/20 DTRs symmetric   Skin: Skin is warm and dry.  Psychiatric: pleasant and cooperative.    Assessment/Plan: 1. 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 Physiatrist is providing close team supervision and 24 hour management of active medical problems listed below. Physiatrist and rehab team continue to assess barriers to discharge/monitor patient progress toward functional and medical goals.  Function:  Bathing Bathing position   Position: Shower  Bathing parts Body parts bathed by patient: Right arm, Left arm, Chest, Abdomen, Right upper leg, Left upper leg, Right lower leg, Left lower leg, Front perineal area, Buttocks, Back Body parts bathed by helper: Back  Bathing assist Assist Level: Set up, Supervision or verbal cues   Set up : To obtain items  Upper Body Dressing/Undressing Upper body dressing   What is the patient wearing?: Pull over shirt/dress     Pull over shirt/dress - Perfomed by patient: Thread/unthread right sleeve, Thread/unthread left sleeve, Put head through opening, Pull shirt over trunk Pull over shirt/dress - Perfomed by helper: Pull shirt over trunk     Orthosis activity level: Performed by helper  Upper body assist Assist Level: More than reasonable time   Set up : To obtain clothing/put away  Lower Body Dressing/Undressing Lower body dressing   What is the patient wearing?: Pants, Shoes, AFO, Ted Hose, Underwear Underwear - Performed by patient: Thread/unthread right underwear leg, Thread/unthread left underwear leg, Pull underwear up/down Underwear - Performed by helper: Thread/unthread right underwear leg, Thread/unthread left underwear leg, Pull underwear up/down Pants- Performed by patient:  Thread/unthread right pants leg, Thread/unthread left pants leg, Pull pants up/down Pants- Performed by helper: Pull pants up/down   Non-skid slipper socks- Performed by helper: Don/doff right sock, Don/doff left sock     Shoes - Performed by patient: Don/doff right  shoe, Don/doff left shoe, Fasten right, Fasten left Shoes - Performed by helper: Don/doff right shoe, Don/doff left shoe AFO - Performed by patient: Don/doff right AFO AFO - Performed by helper: Don/doff right AFO   TED Hose - Performed by helper: Don/doff right TED hose, Don/doff left TED hose  Lower body assist Assist for lower body dressing: Supervision or verbal cues      Toileting Toileting   Toileting steps completed by patient: Adjust clothing prior to toileting Toileting steps completed by helper: Performs perineal hygiene, Adjust clothing after toileting Toileting Assistive Devices: Grab bar or rail  Toileting assist Assist level: Touching or steadying assistance (Pt.75%)   Transfers Chair/bed transfer   Chair/bed transfer method: Stand pivot Chair/bed transfer assist level: Touching or steadying assistance (Pt > 75%) Chair/bed transfer assistive device: Armrests, Walker, Orthosis Mechanical lift: Stedy   Locomotion Ambulation Ambulation activity did not occur: Safety/medical concerns   Max distance: 150 Assist level: Touching or steadying assistance (Pt > 75%)   Wheelchair   Type: Manual Max wheelchair distance: 100 Assist Level: Supervision or verbal cues  Cognition Comprehension Comprehension assist level: Follows complex conversation/direction with extra time/assistive device  Expression Expression assist level: Expresses complex ideas: With extra time/assistive device  Social Interaction Social Interaction assist level: Interacts appropriately 90% of the time - Needs monitoring or encouragement for participation or interaction.  Problem Solving Problem solving assist level: Solves complex problems: Recognizes & self-corrects  Memory Memory assist level: Complete Independence: No helper   Medical Problem List and Plan: 1.  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 Continues to make steady functional gains 2.  DVT Prophylaxis/Anticoagulation: Dopplers with DVT in right gastroc and left peroneal and bilateral post tib veins  -continue mg/kg lovenox  -recheck dopplers tomorrow 3. Pain Management: Robaxin and oxycodone as needed 4. Mood: Provide emotional support 5. Neuropsych: This patient is capable of making decisions on his own behalf. 6. Skin/Wound Care: Routine skin checks 7. Fluids/Electrolytes/Nutrition:    -encourage PO 8. Acute blood loss anemia.  hgb stable 10.7 9. OSA. CPAP 10. Hypertension. HCTZ 12.5 mg daily, Toprol-XL 50 mg daily, Avapro 300 mg daily. Monitor with increased mobility 11. Diabetes mellitus with peripheral neuropathy. Hemoglobin A1c 6.0.SSI.  Check blood sugars before meals and at bedtime. Patient on Glucophage 1000 mg twice a day prior to admission.   -Resumed glucophage at 1000mg  bid    -continue diet carb mod.   -sugars improved    12. History of CVA. Plavix currently on hold as awaiting plan further lumbar surgery eventually (would continue to hold while on treatment dose lovenox) 13. Hyperlipidemia. Lipitor 14. Neurogenic bowel:  continent.   -miralax prn  -continued scheduled senokot-s 15. Tobacco abuse: Counsel   LOS (Days) Corrigan T, MD 07/14/2016 8:49 AM

## 2016-07-14 NOTE — Progress Notes (Signed)
Physical Therapy Session Note  Patient Details  Name: Rodney Crane MRN: [id number] Date of Birth: Dec 15, 1950  Today's Date: 07/14/2016 PT Individual Time: 0930-1045 PT Individual Time Calculation (min): 75 min   Short Term Goals: Week 2:  PT Short Term Goal 1 (Week 2): =LTG due to estimated LOS  Skilled Therapeutic Interventions/Progress Updates: Pt received seated in w/c, denies pain and agreeable to treatment. Discussed DME needs with pt and wife; they both express desire to have both RW and w/c, but want to know what insurance coverage is before deciding which equipment he will take home. Pt propels w/c to/from gym with BLE for strengthening and endurance. Performed ascent/descent one 3" step with RW and min guard faded to S by final trial; 4 trials total. Min cues for technique and correct lead LE for safety. Standing balance on foam wedge and airex pad while engaged in card matching task and pipe tree to facilitate ankle strategy and righting reactions, specifically posterior LOB. Training in posterior stepping strategies with therapist applying anterior resistance, removed quickly. Required mod/maxA to maintain balance initially, reduced to min guard with improving stepping length, speed and efficiency with repetition. Returned to room as above; remained seated in w/c at end of session, all needs in reach.   Therapy Documentation Precautions:  Precautions Precautions: Cervical, Fall Required Braces or Orthoses: Cervical Brace Cervical Brace: Hard collar, At all times Restrictions Weight Bearing Restrictions: No Pain: Pain Assessment Pain Score: 2   See Function Navigator for Current Functional Status.   Therapy/Group: Individual Therapy  [name] 07/14/2016, 10:45 AM

## 2016-07-15 ENCOUNTER — Inpatient Hospital Stay (HOSPITAL_COMMUNITY): Payer: Medicare Other | Admitting: Physical Therapy

## 2016-07-15 ENCOUNTER — Inpatient Hospital Stay (HOSPITAL_COMMUNITY): Payer: Medicare Other

## 2016-07-15 LAB — GLUCOSE, CAPILLARY
GLUCOSE-CAPILLARY: 127 mg/dL — AB (ref 65–99)
GLUCOSE-CAPILLARY: 152 mg/dL — AB (ref 65–99)
Glucose-Capillary: 115 mg/dL — ABNORMAL HIGH (ref 65–99)
Glucose-Capillary: 130 mg/dL — ABNORMAL HIGH (ref 65–99)

## 2016-07-15 NOTE — Progress Notes (Signed)
    Subjective:     Patient reports pain as 3 on 0-10 scale.  Reports decreased arm pain denies incisional neck pain   Positive void Positive bowel movement Positive flatus Negative chest pain or shortness of breath  Objective: Vital signs in last 24 hours: Temp:  [97.6 F (36.4 C)-97.7 F (36.5 C)] 97.6 F (36.4 C) (03/23 0540) Pulse Rate:  [75-80] 75 (03/23 0540) Resp:  [18] 18 (03/23 0540) BP: (119-121)/(67) 119/67 (03/23 0540) SpO2:  [100 %] 100 % (03/23 0540)  Intake/Output from previous day: 03/22 0701 - 03/23 0700 In: 720 [P.O.:720] Out: -   Labs:  Recent Labs  07/14/16 0504  WBC 7.5  RBC 3.66*  HCT 32.3*  PLT 421*   No results for input(s): NA, K, CL, CO2, BUN, CREATININE, GLUCOSE, CALCIUM in the last 72 hours. No results for input(s): LABPT, INR in the last 72 hours.  Physical Exam: ABD soft Intact pulses distally Incision: dressing C/D/I Compartment soft Ambulating - 400 ft with walker Improved UE coordination, grip strength and mobility  Assessment/Plan: Patient stable  xrays n/a Mobilization with physical therapy Encourage incentive spirometry Continue care  Advance diet Up with therapy  Improving with CIR Patient with bilateral DVT - ok to start pharmacologic agent for DVT Wound healing well - no complicating features - ok to remove sutures today Will obtain xrays in 4 weeks  Melina Schools, MD [place] [phone]

## 2016-07-15 NOTE — Progress Notes (Signed)
[facility] PHYSICAL MEDICINE & REHABILITATION     PROGRESS NOTE    Subjective/Complaints: No new complaints. Up in ADL bathroom with OT. Improved strength and coordination   ROS: pt denies nausea, vomiting, diarrhea, cough, shortness of breath or chest pain     Objective: Vital Signs: Blood pressure 119/67, pulse 75, temperature 97.6 F (36.4 C), temperature source Oral, resp. rate 18, height 6\' 4"  (1.93 m), weight 117 kg (258 lb), SpO2 100 %. No results found.  Recent Labs  07/14/16 0504  WBC 7.5  HGB 10.7*  HCT 32.3*  PLT 421*   No results for input(s): NA, K, CL, GLUCOSE, BUN, CREATININE, CALCIUM in the last 72 hours.  Invalid input(s): CO CBG (last 3)   Recent Labs  07/14/16 1651 07/14/16 2053 07/15/16 0657  GLUCAP 115* 170* 127*    Wt Readings from Last 3 Encounters:  07/06/16 117 kg (258 lb)  07/02/16 133.7 kg (294 lb 11.2 oz)  06/23/16 130.2 kg (287 lb)    Physical Exam:  Constitutional: He is oriented to person, place, and time. He appears well-developed.  HENT:  Head: Normocephalic and atraumatic.  Eyes: Conjunctivae and EOM are normal. Left eye exhibits no discharge.  Neck:  Posterior neck incision is clean and mostly dry with scant drainage on dressing Cardiovascular: RRR Respiratory: CTA B GI: Soft. Bowel sounds are normal.  ND, + bowel sounds  Musculoskeletal: He exhibits tr edema in upper and lower ext Neurological: He is alert and oriented to person, place, and time.  Motor: RUE: Shoulder abduction 4-/5, elbow flexion/extension 4/5, wrist/hand 4 to 4+ to 5/5 LUE: Shoulder abduction 4-/5, elbow flexion/extension 4/5, wrist/hand 4+ to 5/5 RLE: HF, KE 2 to 2+/5, ADF/PF 2+ to 3/5 LLE: HF, KE 3+/5, ADF/PF 4/5 Sensation diminished to light touch in hands and feet--motor and sensory exam unchanged from 3/20 DTRs symmetric   Skin: Skin is warm and dry. Incision with scab. No drainage/sutures Psychiatric: pleasant and cooperative.     Assessment/Plan: 1. 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 Physiatrist is providing close team supervision and 24 hour management of active medical problems listed below. Physiatrist and rehab team continue to assess barriers to discharge/monitor patient progress toward functional and medical goals.  Function:  Bathing Bathing position   Position: Shower  Bathing parts Body parts bathed by patient: Right arm, Left arm, Chest, Abdomen, Right upper leg, Left upper leg, Right lower leg, Left lower leg, Front perineal area, Buttocks, Back Body parts bathed by helper: Back  Bathing assist Assist Level: Set up, Supervision or verbal cues   Set up : To obtain items  Upper Body Dressing/Undressing Upper body dressing   What is the patient wearing?: Pull over shirt/dress     Pull over shirt/dress - Perfomed by patient: Thread/unthread right sleeve, Thread/unthread left sleeve, Put head through opening, Pull shirt over trunk Pull over shirt/dress - Perfomed by helper: Pull shirt over trunk     Orthosis activity level: Performed by helper  Upper body assist Assist Level: More than reasonable time   Set up : To obtain clothing/put away  Lower Body Dressing/Undressing Lower body dressing   What is the patient wearing?: Pants, Shoes, AFO, Ted Hose, Underwear Underwear - Performed by patient: Thread/unthread right underwear leg, Thread/unthread left underwear leg, Pull underwear up/down Underwear - Performed by helper: Thread/unthread right underwear leg, Thread/unthread left underwear leg, Pull underwear up/down  Pants- Performed by patient: Thread/unthread right pants leg, Thread/unthread left pants leg, Pull pants up/down Pants- Performed by helper: Pull pants up/down   Non-skid slipper socks- Performed by helper: Don/doff right sock, Don/doff left sock      Shoes - Performed by patient: Don/doff right shoe, Don/doff left shoe, Fasten right, Fasten left Shoes - Performed by helper: Don/doff right shoe, Don/doff left shoe AFO - Performed by patient: Don/doff right AFO AFO - Performed by helper: Don/doff right AFO   TED Hose - Performed by helper: Don/doff right TED hose, Don/doff left TED hose  Lower body assist Assist for lower body dressing: Supervision or verbal cues      Toileting Toileting   Toileting steps completed by patient: Adjust clothing prior to toileting Toileting steps completed by helper: Performs perineal hygiene, Adjust clothing after toileting Toileting Assistive Devices: Grab bar or rail  Toileting assist Assist level: Touching or steadying assistance (Pt.75%)   Transfers Chair/bed transfer   Chair/bed transfer method: Ambulatory Chair/bed transfer assist level: Supervision or verbal cues Chair/bed transfer assistive device: Armrests, Environmental consultant, Orthosis Mechanical lift: Ecologist Ambulation activity did not occur: Safety/medical concerns   Max distance: 210 Assist level: Supervision or verbal cues   Wheelchair   Type: Manual Max wheelchair distance: 100 Assist Level: Supervision or verbal cues  Cognition Comprehension Comprehension assist level: Follows complex conversation/direction with no assist  Expression Expression assist level: Expresses complex ideas: With extra time/assistive device  Social Interaction Social Interaction assist level: Interacts appropriately 90% of the time - Needs monitoring or encouragement for participation or interaction.  Problem Solving Problem solving assist level: Solves complex problems: Recognizes & self-corrects  Memory Memory assist level: More than reasonable amount of time   Medical Problem List and Plan: 1.  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 Continues to make steady functional gains 2.  DVT Prophylaxis/Anticoagulation: Dopplers with persistent DVT in right and left posterior tibial veins. Other clots have resolved  -convert to xarelto and re-check dopplers as outpt in a month to 6 weeks 3. Pain Management: Robaxin and oxycodone as needed 4. Mood: Provide emotional support 5. Neuropsych: This patient is capable of making decisions on his own behalf. 6. Skin/Wound Care: Routine skin checks--sutures out today per ortho 7. Fluids/Electrolytes/Nutrition:    -encourage PO 8. Acute blood loss anemia.  hgb stable 10.7 9. OSA. CPAP 10. Hypertension. HCTZ 12.5 mg daily, Toprol-XL 50 mg daily, Avapro 300 mg daily. Monitor with increased mobility 11. Diabetes mellitus with peripheral neuropathy. Hemoglobin A1c 6.0.SSI.  Check blood sugars before meals and at bedtime. Patient on Glucophage 1000 mg twice a day prior to admission.   -Resumed glucophage at 1000mg  bid    -continue diet carb mod.   -sugars improved    12. History of CVA. Plavix currently on hold as awaiting plan further lumbar surgery eventually (would continue to hold while on xarelto) 13. Hyperlipidemia. Lipitor 14. Neurogenic bowel:  continent.   -miralax prn  -continued scheduled senokot-s 15. Tobacco abuse: Counsel   LOS (Days) 11 A FACE TO FACE EVALUATION WAS PERFORMED  Meredith Staggers, MD 07/15/2016 9:17 AM

## 2016-07-15 NOTE — Progress Notes (Signed)
ANTICOAGULATION CONSULT NOTE - Follow Up Consult  Pharmacy Consult for lovenox Indication: DVT  No Known Allergies  Patient Measurements: Height: 6\' 4"  (193 cm) Weight: 258 lb (117 kg) IBW/kg (Calculated) : 86.8 Heparin Dosing Weight:   Vital Signs: Temp: 97.6 F (36.4 C) (03/23 0540) Temp Source: Oral (03/23 0540) BP: 119/67 (03/23 0540) Pulse Rate: 75 (03/23 0540)  Labs:  Recent Labs  07/14/16 0504  HGB 10.7*  HCT 32.3*  PLT 421*    Estimated Creatinine Clearance: 97.7 mL/min (by C-G formula based on SCr of 1.04 mg/dL).   Medications:  Scheduled:  . atorvastatin  20 mg Oral q1800  . enoxaparin (LOVENOX) injection  1 mg/kg Subcutaneous Q12H  . hydrochlorothiazide  12.5 mg Oral Daily  . insulin aspart  0-20 Units Subcutaneous TID AC & HS  . irbesartan  300 mg Oral Daily  . metFORMIN  500 mg Oral BID WC  . metoprolol succinate  50 mg Oral Daily  . pantoprazole  40 mg Oral q1800  . polyethylene glycol  17 g Oral Daily  . senna-docusate  2 tablet Oral Q0600   Infusions:    Assessment: 66 yo male with DVT is currently on treatment dose of lovenox.  CBC on 07/11/16 was stable. Scr 1.04 on 3/13. Est. crcl > 90 ml/min  Goal of Therapy:  Anti-Xa level 0.6-1 units/ml 4hrs after LMWH dose given Monitor platelets by anticoagulation protocol: Yes   Plan:  Lovenox 115 mg (1 mg/kg) q12h Monitor renal fx, cbc F/u transitioning to PO anticoag  Maryanna Shape, PharmD, BCPS  Clinical Pharmacist  Pager: [phone]   07/15/2016,1:41 PM

## 2016-07-15 NOTE — Progress Notes (Signed)
Physical Therapy Session Note  Patient Details  Name: Rodney Crane MRN: [id number] Date of Birth: [birth year]  Today's Date: 07/15/2016 PT Individual Time: 1100-1200 and 1415-1530 PT Individual Time Calculation (min): 60 min and 75 min (total 135 min)   Short Term Goals: Week 2:  PT Short Term Goal 1 (Week 2): =LTG due to estimated LOS  Skilled Therapeutic Interventions/Progress Updates: Tx 1: Pt received seated in w/c, denies pain and agreeable to treatment. W/c propulsion to gym with BUE for strengthening. Sit <>stand x10 reps with S and RW. Side stepping R/L in parallel bars, also performed with orange theraband resistance; cues for eccentric control. Standing balance on airex pad, foam wedge, and rocker board for facilitation of ankle strategy, righting reflex and facilitation of tibialis anterior. Therapist applied external perturbations posteriorly to activate ankle strategy; required repetition to correctly initiate righting. Gait x50' with RW and S. Performed nustep x8 min with BUE/BLE level 6 with average 45 steps/min. Returned to room BUE w/c propulsion; remained seated in w/c at end of session, all needs in reach.   Tx 2: Pt received seated in w/c, denies pain and agreeable to treatment. W/c propulsion to gym with BLE for strengthening and endurance. Gait 2x175' with RW and S; improving step length and symmetry, note continued R trendelenburg worsened with fatigue. Stairs 1x8 at 6" height with B handrails and S. Step ups 1x6 reps with RLE at 6" step before fatigued; min guard, cues for powerful propulsion. Standing balance on airex pad with ball throws/catching with student observer; min guard and several LOBs posteriorly recovered with UEs on parallel bars. Standing anterior weight shift against resistance of therapist's hands to activate closed chain dorsiflexion; poor carryover into next trial of standing on airex. Standing kinetron marching x4 trials at approximately 1 min per trial  before fatigued. UE ergometer x10 min with several short rest breaks. Gait to return to room with RW approximately 100' before fatigued; propelled remaining distance with BLEs. Remained seated in w/c at end of session, wife present and all needs in reach.      Therapy Documentation Precautions:  Precautions Precautions: Cervical, Fall Required Braces or Orthoses: Cervical Brace Cervical Brace: Hard collar, At all times Restrictions Weight Bearing Restrictions: No   See Function Navigator for Current Functional Status.   Therapy/Group: Individual Therapy  [name] 07/15/2016, 11:55 AM

## 2016-07-15 NOTE — Progress Notes (Signed)
    Subjective:    Patient reports pain as 3 on 0-10 scale.  Denies arm pain Denies CP or SOB.  Voiding without difficulty. Positive BM.  Pt in good spirits.  Pts wife is with him.  Pt has PT coming up at 2:15pm.  He reports gaining strength and function.  Objective: Vital signs in last 24 hours: Temp:  [97.6 F (36.4 C)-97.7 F (36.5 C)] 97.6 F (36.4 C) (03/23 0540) Pulse Rate:  [75-80] 75 (03/23 0540) Resp:  [18] 18 (03/23 0540) BP: (119-121)/(67) 119/67 (03/23 0540) SpO2:  [100 %] 100 % (03/23 0540)  Intake/Output from previous day: 03/22 0701 - 03/23 0700 In: 720 [P.O.:720] Out: -  Intake/Output this shift: Total I/O In: 480 [P.O.:480] Out: -   Labs:  Recent Labs  07/14/16 0504  HGB 10.7*    Recent Labs  07/14/16 0504  WBC 7.5  RBC 3.66*  HCT 32.3*  PLT 421*   No results for input(s): NA, K, CL, CO2, BUN, CREATININE, GLUCOSE, CALCIUM in the last 72 hours. No results for input(s): LABPT, INR in the last 72 hours.  Physical Exam: Neurologically intact ABD soft Sensation intact distally Incision: no drainage Compartment soft  Assessment/Plan:    Removed Sutured and dressed wound with xeroform Continue Inpatient rehab  [place], Darla Lesches for Dr. Melina Schools [facility] [phone] 07/15/2016, 1:34 PM

## 2016-07-15 NOTE — Progress Notes (Signed)
Occupational Therapy Session Note  Patient Details  Name: Rodney Crane MRN: [id number] Date of Birth: Mar 13, 1951  Today's Date: 07/15/2016 OT Individual Time: 8177-1165 and 7903-8333 OT Individual Time Calculation (min): 75 min and 30 min   Short Term Goals: Week 2:  OT Short Term Goal 1 (Week 2): STG=LTG due to LOS  Skilled Therapeutic Interventions/Progress Updates:    Upon entering pt seen ambulating to bathroom with wife present who has been trained and cleared by staff to assist to toilet. Pt transfers onto toilet with supervision and manages gown/posterior hygiene with supervision. Pt ambulates to transfer onto transfer tub bench in walk in shower with supervision and VC for safety awareness. Pt bathes 10/10 body parts with distant supervision and set up assist. OT changes orthosis pads and pt dons UB LB clothing with set up. Educated pt on using plastic to cover foot to assist donning ted hose independently. Pt require min A and increased time to pull ted hose over heel. Pt stands at sink to brush teeth to improve functional standing endurance and balance. Pt propels w/c to ADL apartment with supervision and one rest break. Pt ambulates into bathroom with RW and transfers onto TTB with supervision and VC for safety. Exited session with pt seated in w/c in room with all needs met and call light in reach.   Session 2: 1:1. No c/o pain. Focus of session on [facility] for B hands. Pt propels to/from all therapeutic destinations to improve BUE strength and endurance. OT takes out laces of shoes completely. With increased time, Pt relaces shoes strings in both shoes to improve [facility] of B hands with supervision while seated in w/c. Pt dons B shoes seated in w/c with supervision and able to fasten shoes in seated figure 4. Educated wife on a variety of seated [place] activities pt could complete at home to improve hand function as well as encouraging crafting with grandchildren. Pt returned to room with wife  seated in w/c with call light in reach and all needs met.   Therapy Documentation Precautions:  Precautions Precautions: Cervical, Fall Required Braces or Orthoses: Cervical Brace Cervical Brace: Hard collar, At all times Restrictions Weight Bearing Restrictions: No  See Function Navigator for Current Functional Status.   Therapy/Group: Individual Therapy  Tonny Branch 07/15/2016, 9:47 AM

## 2016-07-16 ENCOUNTER — Inpatient Hospital Stay (HOSPITAL_COMMUNITY): Payer: Medicare Other

## 2016-07-16 LAB — CREATININE, SERUM: Creatinine, Ser: 1.2 mg/dL (ref 0.61–1.24)

## 2016-07-16 LAB — GLUCOSE, CAPILLARY
GLUCOSE-CAPILLARY: 108 mg/dL — AB (ref 65–99)
GLUCOSE-CAPILLARY: 140 mg/dL — AB (ref 65–99)
GLUCOSE-CAPILLARY: 139 mg/dL — AB (ref 65–99)
Glucose-Capillary: 132 mg/dL — ABNORMAL HIGH (ref 65–99)

## 2016-07-16 MED ORDER — RIVAROXABAN 20 MG PO TABS
20.0000 mg | ORAL_TABLET | Freq: Every day | ORAL | Status: DC
  Administered 2016-07-16 – 2016-07-19 (×4): 20 mg via ORAL
  Filled 2016-07-16 (×4): qty 1

## 2016-07-16 NOTE — Progress Notes (Signed)
Occupational Therapy Session Note  Patient Details  Name: Rodney Crane MRN: [id number] Date of Birth: 1951/01/29  Today's Date: 07/16/2016 OT Individual Time: 5615-3794 OT Individual Time Calculation (min): 88 min    Short Term Goals: Week 2:  OT Short Term Goal 1 (Week 2): STG=LTG due to LOS  Skilled Therapeutic Interventions/Progress Updates:    1:1 session focused on [facility], handwriting, and functional mobility. No complaint of pain. Pt propel w/c to therapeutic destination with increased time ~200 feet with supervision, and at end of session ambulate back to room with RW with supervision and VC for foot placement with heel strike and significantly increased time. Pt requires seated rest break both directions d/t fatigue. Pt requests to focus on handwriting this session. Pt trials pen with/without red foam handle and there is no significant difference in legibility. Pt practices signature 10 times with VC to use larger print. In day room with wife and OT, pt learns rules of new games, scoring and hands out all pieces/tiles throughout session to improve [facility] of hands. Pt keeps score and records score on piece of paper. Pt verbalizes concern that his memory is not as good as it was prior to fall. Educated pt on memory strategies, use of lists, alarms and even applications on phones to use to assist with memory. Exited session with pt in room seated in standard chair with call light in reach, wife present and pt declines chair alarm.   Therapy Documentation Precautions:  Precautions Precautions: Cervical, Fall Required Braces or Orthoses: Cervical Brace Cervical Brace: Hard collar, At all times Restrictions Weight Bearing Restrictions: No ADL: ADL ADL Comments: see functional navigator Exercises:   Other Treatments:    See Function Navigator for Current Functional Status.   Therapy/Group: Individual Therapy  Tonny Branch 07/16/2016, 3:40 PM

## 2016-07-16 NOTE — Progress Notes (Signed)
On arrival patient is already on his CPAP for the night resting comfortably.

## 2016-07-16 NOTE — Progress Notes (Signed)
[place] PHYSICAL MEDICINE & REHABILITATION     PROGRESS NOTE    Subjective/Complaints: No new issues. Slept well.   ROS: pt denies nausea, vomiting, diarrhea, cough, shortness of breath or chest pain     Objective: Vital Signs: Blood pressure (!) 141/85, pulse 79, temperature 98.2 F (36.8 C), temperature source Oral, resp. rate 17, height 6\' 4"  (1.93 m), weight 117 kg (258 lb), SpO2 100 %. No results found.  Recent Labs  07/14/16 0504  WBC 7.5  HGB 10.7*  HCT 32.3*  PLT 421*    Recent Labs  07/16/16 0356  CREATININE 1.20   CBG (last 3)   Recent Labs  07/15/16 1719 07/15/16 2119 07/16/16 0628  GLUCAP 130* 152* 108*    Wt Readings from Last 3 Encounters:  07/06/16 117 kg (258 lb)  07/02/16 133.7 kg (294 lb 11.2 oz)  06/23/16 130.2 kg (287 lb)    Physical Exam:  Constitutional: He is oriented to person, place, and time. He appears well-developed.  HENT:  Head: Normocephalic and atraumatic.  Eyes: Conjunctivae and EOM are normal. Left eye exhibits no discharge.  Neck:  Posterior neck incision is clean and mostly dry with scant drainage on dressing Cardiovascular: RRR Respiratory: CTA B. Normal effort GI: Soft. Bowel sounds are normal.  ND/NT, + bowel sounds  Musculoskeletal: He exhibits tr edema in upper and lower ext Neurological: He is alert and oriented to person, place, and time.  Motor: RUE: Shoulder abduction 4-/5, elbow flexion/extension 4/5, wrist/hand 4 to 4+ to 5/5 LUE: Shoulder abduction 4-/5, elbow flexion/extension 4/5, wrist/hand 4+ to 5/5 RLE: HF, KE 2 to 2+/5, ADF/PF 2+ to 3/5 LLE: HF, KE 3+/5, ADF/PF 4/5 Sensation diminished to light touch in hands and feet--motor and sensory exam unchanged from 3/20 DTRs symmetric   Skin: Skin is warm and dry. Incision with scab. No drainage/sutures Psychiatric: pleasant and cooperative.    Assessment/Plan: 1. 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 Physiatrist is providing close team supervision and 24 hour management of active medical problems listed below. Physiatrist and rehab team continue to assess barriers to discharge/monitor patient progress toward functional and medical goals.  Function:  Bathing Bathing position   Position: Shower  Bathing parts Body parts bathed by patient: Right arm, Left arm, Chest, Abdomen, Right upper leg, Left upper leg, Right lower leg, Left lower leg, Front perineal area, Buttocks, Back Body parts bathed by helper: Back  Bathing assist Assist Level: Set up, Supervision or verbal cues   Set up : To obtain items  Upper Body Dressing/Undressing Upper body dressing   What is the patient wearing?: Pull over shirt/dress     Pull over shirt/dress - Perfomed by patient: Thread/unthread right sleeve, Thread/unthread left sleeve, Put head through opening, Pull shirt over trunk Pull over shirt/dress - Perfomed by helper: Pull shirt over trunk     Orthosis activity level: Performed by helper  Upper body assist Assist Level: More than reasonable time   Set up : To obtain clothing/put away  Lower Body Dressing/Undressing Lower body dressing   What is the patient wearing?: Pants, Shoes, AFO, Ted Hose, Underwear Underwear - Performed by patient: Thread/unthread right underwear leg, Thread/unthread left underwear leg, Pull underwear up/down Underwear - Performed by helper: Thread/unthread right underwear leg, Thread/unthread left underwear leg, Pull underwear up/down Pants- Performed by patient: Thread/unthread right pants leg, Thread/unthread left pants leg, Pull pants  up/down Pants- Performed by helper: Pull pants up/down   Non-skid slipper socks- Performed by helper: Don/doff right sock, Don/doff left sock     Shoes - Performed by patient: Don/doff right shoe, Don/doff left shoe, Fasten right, Fasten left Shoes -  Performed by helper: Don/doff right shoe, Don/doff left shoe AFO - Performed by patient: Don/doff right AFO AFO - Performed by helper: Don/doff right AFO   TED Hose - Performed by helper: Don/doff right TED hose, Don/doff left TED hose  Lower body assist Assist for lower body dressing: Supervision or verbal cues      Toileting Toileting Toileting activity did not occur:  (using urinal, no BM) Toileting steps completed by patient: Adjust clothing prior to toileting, Performs perineal hygiene, Adjust clothing after toileting (Pt wearing gown) Toileting steps completed by helper: Performs perineal hygiene, Adjust clothing after toileting Toileting Assistive Devices: Grab bar or rail  Toileting assist Assist level: Supervision or verbal cues   Transfers Chair/bed transfer   Chair/bed transfer method: Stand pivot Chair/bed transfer assist level: Supervision or verbal cues Chair/bed transfer assistive device: Armrests, Walker, Orthosis Mechanical lift: Ecologist Ambulation activity did not occur: Safety/medical concerns   Max distance: 175 Assist level: Supervision or verbal cues   Wheelchair   Type: Manual Max wheelchair distance: 100 Assist Level: Supervision or verbal cues  Cognition Comprehension Comprehension assist level: Follows complex conversation/direction with extra time/assistive device  Expression Expression assist level: Expresses complex ideas: With extra time/assistive device  Social Interaction Social Interaction assist level: Interacts appropriately with others - No medications needed.  Problem Solving Problem solving assist level: Solves complex problems: With extra time  Memory Memory assist level: Recognizes or recalls 90% of the time/requires cueing < 10% of the time   Medical Problem List and Plan: 1.  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 Continues to make steady functional gains 2.  DVT Prophylaxis/Anticoagulation: Dopplers with persistent DVT in right and left posterior tibial veins. Other clots have resolved  -convert to xarelto and re-check dopplers as outpt in a month to 6 weeks---will not use transitional dosing of xarelto given location of dvt's 3. Pain Management: Robaxin and oxycodone as needed 4. Mood: Provide emotional support 5. Neuropsych: This patient is capable of making decisions on his own behalf. 6. Skin/Wound Care: Routine skin checks--sutures out today per ortho 7. Fluids/Electrolytes/Nutrition:    -encourage PO 8. Acute blood loss anemia.  hgb stable 10.7 9. OSA. CPAP 10. Hypertension. HCTZ 12.5 mg daily, Toprol-XL 50 mg daily, Avapro 300 mg daily. Monitor with increased mobility 11. Diabetes mellitus with peripheral neuropathy. Hemoglobin A1c 6.0.SSI.  Check blood sugars before meals and at bedtime. Patient on Glucophage 1000 mg twice a day prior to admission.   -Resumed glucophage at 1000mg  bid    -continue diet carb mod.   -sugars improved overall    12. History of CVA. Plavix currently on hold as awaiting plan further lumbar surgery eventually (would continue to hold while on xarelto) 13. Hyperlipidemia. Lipitor 14. Neurogenic bowel:  continent.   -miralax prn  -continued scheduled senokot-s 15. Tobacco abuse: Counsel   LOS (Days) 12 A FACE TO FACE EVALUATION WAS PERFORMED  Meredith Staggers, MD 07/16/2016 7:59 AM

## 2016-07-16 NOTE — Progress Notes (Signed)
Patient states he places himself on and off of CPAP when ready. RT informed patient if he needs any help have RN contact RT. 

## 2016-07-17 ENCOUNTER — Inpatient Hospital Stay (HOSPITAL_COMMUNITY): Payer: Medicare Other | Admitting: Physical Therapy

## 2016-07-17 LAB — CBC
HCT: 33.1 % — ABNORMAL LOW (ref 39.0–52.0)
HEMOGLOBIN: 10.9 g/dL — AB (ref 13.0–17.0)
MCH: 29.2 pg (ref 26.0–34.0)
MCHC: 32.9 g/dL (ref 30.0–36.0)
MCV: 88.7 fL (ref 78.0–100.0)
Platelets: 369 10*3/uL (ref 150–400)
RBC: 3.73 MIL/uL — AB (ref 4.22–5.81)
RDW: 13.3 % (ref 11.5–15.5)
WBC: 7 10*3/uL (ref 4.0–10.5)

## 2016-07-17 LAB — GLUCOSE, CAPILLARY
Glucose-Capillary: 142 mg/dL — ABNORMAL HIGH (ref 65–99)
Glucose-Capillary: 133 mg/dL — ABNORMAL HIGH (ref 65–99)
Glucose-Capillary: 123 mg/dL — ABNORMAL HIGH (ref 65–99)
Glucose-Capillary: 132 mg/dL — ABNORMAL HIGH (ref 65–99)

## 2016-07-17 NOTE — Progress Notes (Signed)
Pt places self on/off cpap.  Rt will monitor. 

## 2016-07-17 NOTE — Progress Notes (Signed)
Physical Therapy Session Note  Patient Details  Name: Rodney Crane MRN: [id number] Date of Birth: Nov 29, 1950  Today's Date: 07/17/2016 PT Individual Time: 0900-1000 PT Individual Time Calculation (min): 60 min   Short Term Goals: Week 2:  PT Short Term Goal 1 (Week 2): =LTG due to estimated LOS  Skilled Therapeutic Interventions/Progress Updates: Pt received seated in w/c donning shoes with modI; denies pain and agreeable to treatment. Discussed home setup with pt and wife; wife is concerned about pt getting up from low toilet but does report OT has ordered them BSC to place over toilet. Sit>stand with S; once in standing pt's R knee buckled x2 after taking 2-3 steps and pt requests to sit and propel to gym instead of walking. Stand pivot transfer w/c<>nustep with S. Performed nustep x14 min with BLE level 4 with average 50 steps/min. Gait x10' with RW and min guard; one episode of buckling however recovered without assist. Seated LE strengthening exercises 2x10 reps each long arc quad, hip flexion marching, pillow squeeze adduction. Squat pivot transfer to return to w/c with S. W/c propulsion to return to room with BUE and modI. Remained seated in w/c at end of session, all needs in reach.      Therapy Documentation Precautions:  Precautions Precautions: Cervical, Fall Required Braces or Orthoses: Cervical Brace Cervical Brace: Hard collar, At all times Restrictions Weight Bearing Restrictions: No  See Function Navigator for Current Functional Status.   Therapy/Group: Individual Therapy  [name] 07/17/2016, 9:51 AM

## 2016-07-17 NOTE — Progress Notes (Signed)
Spouse performed dressing change with RN supervision. Verbalized and demonstrated understanding.

## 2016-07-17 NOTE — Progress Notes (Signed)
Rodney Crane PHYSICAL MEDICINE & REHABILITATION     PROGRESS NOTE    Subjective/Complaints: Feeling well. Had questions about wound,shower  ROS: pt denies nausea, vomiting, diarrhea, cough, shortness of breath or chest pain      Objective: Vital Signs: Blood pressure (!) 153/72, pulse 97, temperature 98.4 F (36.9 C), temperature source Oral, resp. rate 18, height 6\' 4"  (1.93 m), weight 117 kg (258 lb), SpO2 100 %. No results found.  Recent Labs  07/17/16 0554  WBC 7.0  HGB 10.9*  HCT 33.1*  PLT 369    Recent Labs  07/16/16 0356  CREATININE 1.20   CBG (last 3)   Recent Labs  07/16/16 1631 07/16/16 2026 07/17/16 0707  GLUCAP 140* 139* 142*    Wt Readings from Last 3 Encounters:  07/06/16 117 kg (258 lb)  07/02/16 133.7 kg (294 lb 11.2 oz)  06/23/16 130.2 kg (287 lb)    Physical Exam:  Constitutional: He is oriented to person, place, and time. He appears well-developed.  HENT:  Head: Normocephalic and atraumatic.  Eyes: Conjunctivae and EOM are normal. Left eye exhibits no discharge.  Neck:  Posterior neck incision with s/s drainage from scabbed area. Cardiovascular: RRR Respiratory: CTA B. Normal effort GI: Soft. Bowel sounds are normal.  ND/NT, + bowel sounds  Musculoskeletal: He exhibits tr edema in upper and lower ext Neurological: He is alert and oriented to person, place, and time.  Motor: RUE: Shoulder abduction 4-/5, elbow flexion/extension 4/5, wrist/hand 4 to 4+ to 5/5 LUE: Shoulder abduction 4-/5, elbow flexion/extension 4/5, wrist/hand 4+ to 5/5 RLE: HF, KE 2 to 2+/5, ADF/PF 2+ to 3/5 LLE: HF, KE 3+/5, ADF/PF 4/5 Sensation diminished to light touch in hands and feet--stable to improved  DTRs symmetric   Skin: Skin is warm and dry. Incision with scab. No drainage/sutures Psychiatric: pleasant and cooperative.    Assessment/Plan: 1. 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 Physiatrist is providing close team supervision and 24 hour management of active medical problems listed below. Physiatrist and rehab team continue to assess barriers to discharge/monitor patient progress toward functional and medical goals.  Function:  Bathing Bathing position   Position: Shower  Bathing parts Body parts bathed by patient: Right arm, Left arm, Chest, Abdomen, Right upper leg, Left upper leg, Right lower leg, Left lower leg, Front perineal area, Buttocks, Back Body parts bathed by helper: Back  Bathing assist Assist Level: Set up, Supervision or verbal cues   Set up : To obtain items  Upper Body Dressing/Undressing Upper body dressing   What is the patient wearing?: Pull over shirt/dress     Pull over shirt/dress - Perfomed by patient: Thread/unthread right sleeve, Thread/unthread left sleeve, Put head through opening, Pull shirt over trunk Pull over shirt/dress - Perfomed by helper: Pull shirt over trunk     Orthosis activity level: Performed by helper  Upper body assist Assist Level: More than reasonable time   Set up : To obtain clothing/put away  Lower Body Dressing/Undressing Lower body dressing   What is the patient wearing?: Pants, Shoes, AFO, Ted Hose, Underwear Underwear - Performed by patient: Thread/unthread right underwear leg, Thread/unthread left underwear leg, Pull underwear up/down Underwear - Performed by helper: Thread/unthread right underwear leg, Thread/unthread left underwear leg, Pull underwear up/down Pants- Performed by patient: Thread/unthread right pants leg, Thread/unthread left pants leg, Pull pants up/down Pants- Performed by helper: Pull  pants up/down   Non-skid slipper socks- Performed by helper: Don/doff right sock, Don/doff left sock     Shoes - Performed by patient: Don/doff right shoe, Don/doff left shoe, Fasten right, Fasten left Shoes - Performed by  helper: Don/doff right shoe, Don/doff left shoe AFO - Performed by patient: Don/doff right AFO AFO - Performed by helper: Don/doff right AFO   TED Hose - Performed by helper: Don/doff right TED hose, Don/doff left TED hose  Lower body assist Assist for lower body dressing: Supervision or verbal cues      Toileting Toileting Toileting activity did not occur:  (using urinal, no BM) Toileting steps completed by patient: Adjust clothing prior to toileting, Performs perineal hygiene, Adjust clothing after toileting (Pt wearing gown) Toileting steps completed by helper: Performs perineal hygiene, Adjust clothing after toileting Toileting Assistive Devices: Grab bar or rail  Toileting assist Assist level: Supervision or verbal cues   Transfers Chair/bed transfer   Chair/bed transfer method: Stand pivot Chair/bed transfer assist level: Supervision or verbal cues Chair/bed transfer assistive device: Armrests, Walker, Orthosis Mechanical lift: Ecologist Ambulation activity did not occur: Safety/medical concerns   Max distance: 175 Assist level: Supervision or verbal cues   Wheelchair   Type: Manual Max wheelchair distance: 100 Assist Level: Supervision or verbal cues  Cognition Comprehension Comprehension assist level: Follows complex conversation/direction with extra time/assistive device  Expression Expression assist level: Expresses complex ideas: With extra time/assistive device  Social Interaction Social Interaction assist level: Interacts appropriately with others - No medications needed.  Problem Solving Problem solving assist level: Solves complex problems: With extra time  Memory Memory assist level: Recognizes or recalls 90% of the time/requires cueing < 10% of the time   Medical Problem List and Plan: 1.  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 Continues to make steady functional gains  -order replacement aspen collar 2.  DVT Prophylaxis/Anticoagulation: Dopplers with persistent DVT in right and left posterior tibial veins. Other clots have resolved  -converted to xarelto and re-check dopplers as outpt in a month to 6 weeks- 20mg  daily 3. Pain Management: Robaxin and oxycodone as needed 4. Mood: Provide emotional support 5. Neuropsych: This patient is capable of making decisions on his own behalf. 6. Skin/Wound Care: Routine skin checks--sutures out   -dry dressing daily  -he may shower 7. Fluids/Electrolytes/Nutrition:    -encourage PO 8. Acute blood loss anemia.  hgb stable 10.7 9. OSA. CPAP 10. Hypertension. HCTZ 12.5 mg daily, Toprol-XL 50 mg daily, Avapro 300 mg daily. Monitor with increased mobility 11. Diabetes mellitus with peripheral neuropathy. Hemoglobin A1c 6.0.SSI.  Check blood sugars before meals and at bedtime. Patient on Glucophage 1000 mg twice a day prior to admission.   -Resumed glucophage at 1000mg  bid    -continue diet carb mod.   -sugars improved  l    12. History of CVA. Plavix currently on hold (would continue to hold while on xarelto). ?further lumbar surgery? 13. Hyperlipidemia. Lipitor 14. Neurogenic bowel:  continent.   -miralax prn  -continued scheduled senokot-s 15. Tobacco abuse: Counsel   LOS (Days) 13 A FACE TO FACE EVALUATION WAS PERFORMED  Alger Simons T, MD 07/17/2016 8:50 AM

## 2016-07-18 ENCOUNTER — Inpatient Hospital Stay (HOSPITAL_COMMUNITY): Payer: Medicare Other | Admitting: Occupational Therapy

## 2016-07-18 ENCOUNTER — Inpatient Hospital Stay (HOSPITAL_COMMUNITY): Payer: Medicare Other | Admitting: Physical Therapy

## 2016-07-18 LAB — GLUCOSE, CAPILLARY
GLUCOSE-CAPILLARY: 146 mg/dL — AB (ref 65–99)
Glucose-Capillary: 120 mg/dL — ABNORMAL HIGH (ref 65–99)
Glucose-Capillary: 113 mg/dL — ABNORMAL HIGH (ref 65–99)
Glucose-Capillary: 154 mg/dL — ABNORMAL HIGH (ref 65–99)

## 2016-07-18 NOTE — Progress Notes (Signed)
[facility]     PROGRESS NOTE    Subjective/Complaints: No new complaints. Up with therapy.   ROS: pt denies nausea, vomiting, diarrhea, cough, shortness of breath or chest pain      Objective: Vital Signs: Blood pressure 123/74, pulse 97, temperature 97.8 F (36.6 C), temperature source Oral, resp. rate 18, height 6\' 4"  (1.93 m), weight 117 kg (258 lb), SpO2 100 %. No results found.  Recent Labs  07/17/16 0554  WBC 7.0  HGB 10.9*  HCT 33.1*  PLT 369    Recent Labs  07/16/16 0356  CREATININE 1.20   CBG (last 3)   Recent Labs  07/17/16 1634 07/17/16 2101 07/18/16 0630  GLUCAP 123* 132* 120*    Wt Readings from Last 3 Encounters:  07/06/16 117 kg (258 lb)  07/02/16 133.7 kg (294 lb 11.2 oz)  06/23/16 130.2 kg (287 lb)    Physical Exam:  Constitutional: He is oriented to person, place, and time. He appears well-developed.  HENT:  Head: Normocephalic and atraumatic.  Eyes: Conjunctivae and EOM are normal. Left eye exhibits no discharge.  Neck:  Posterior neck incision with decreased drainage Cardiovascular: RRR Respiratory: CTA B. Normal effort GI: Soft. Bowel sounds are normal.  ND/NT, + bowel sounds  Musculoskeletal: He exhibits tr edema in upper and lower ext Neurological: He is alert and oriented to person, place, and time.  Motor: RUE: Shoulder abduction 4-/5, elbow flexion/extension 4/5, wrist/hand 4 to 4+ to 5/5 LUE: Shoulder abduction 4-/5, elbow flexion/extension 4/5, wrist/hand 4+ to 5/5 RLE: HF, KE 2 to 2+/5, ADF/PF 2+ to 3/5 LLE: HF, KE 3+/5, ADF/PF 4/5 Sensation diminished to light touch in hands and feet--stable to improved  DTRs symmetric   Skin: Skin is warm and dry. Incision with scab. No drainage/sutures Psychiatric: pleasant and cooperative.    Assessment/Plan: 1. 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 Physiatrist is providing close team supervision and 24 hour management of active medical problems listed below. Physiatrist and rehab team continue to assess barriers to discharge/monitor patient progress toward functional and medical goals.  Function:  Bathing Bathing position   Position: Shower  Bathing parts Body parts bathed by patient: Right arm, Left arm, Chest, Abdomen, Right upper leg, Left upper leg, Right lower leg, Left lower leg, Front perineal area, Buttocks, Back Body parts bathed by helper: Back  Bathing assist Assist Level: More than reasonable time   Set up : To obtain items  Upper Body Dressing/Undressing Upper body dressing   What is the patient wearing?: Pull over shirt/dress     Pull over shirt/dress - Perfomed by patient: Thread/unthread right sleeve, Thread/unthread left sleeve, Put head through opening, Pull shirt over trunk Pull over shirt/dress - Perfomed by helper: Pull shirt over trunk     Orthosis activity level: Performed by helper  Upper body assist Assist Level: More than reasonable time   Set up : To obtain clothing/put away  Lower Body Dressing/Undressing Lower body dressing   What is the patient wearing?: Pants, Shoes, AFO, Ted Hose, Underwear Underwear - Performed by patient: Thread/unthread right underwear leg, Thread/unthread left underwear leg, Pull underwear up/down Underwear - Performed by helper: Thread/unthread right underwear leg, Thread/unthread left underwear leg, Pull underwear up/down Pants- Performed by patient: Thread/unthread right pants leg, Thread/unthread left pants leg, Pull pants up/down Pants- Performed by helper: Pull pants up/down   Non-skid  slipper socks- Performed by helper: Don/doff right sock, Don/doff left sock     Shoes - Performed by patient: Don/doff right shoe, Don/doff left shoe, Fasten right, Fasten left Shoes - Performed by helper: Don/doff right shoe, Don/doff left  shoe AFO - Performed by patient: Don/doff right AFO AFO - Performed by helper: Don/doff right AFO   TED Hose - Performed by helper: Don/doff left TED hose, Don/doff right TED hose  Lower body assist Assist for lower body dressing: Supervision or verbal cues      Toileting Toileting Toileting activity did not occur:  (using urinal, no BM) Toileting steps completed by patient: Adjust clothing prior to toileting, Performs perineal hygiene, Adjust clothing after toileting (Pt wearing gown) Toileting steps completed by helper: Performs perineal hygiene, Adjust clothing after toileting Toileting Assistive Devices: Grab bar or rail  Toileting assist Assist level: Supervision or verbal cues   Transfers Chair/bed transfer   Chair/bed transfer method: Stand pivot Chair/bed transfer assist level: Supervision or verbal cues Chair/bed transfer assistive device: Armrests, Walker, Orthosis Mechanical lift: Ecologist Ambulation activity did not occur: Safety/medical concerns   Max distance: 175 Assist level: Supervision or verbal cues   Wheelchair   Type: Manual Max wheelchair distance: 100 Assist Level: Supervision or verbal cues  Cognition Comprehension Comprehension assist level: Follows complex conversation/direction with extra time/assistive device  Expression Expression assist level: Expresses complex ideas: With extra time/assistive device  Social Interaction Social Interaction assist level: Interacts appropriately with others - No medications needed.  Problem Solving Problem solving assist level: Solves complex problems: With extra time  Memory Memory assist level: Recognizes or recalls 90% of the time/requires cueing < 10% of the time   Medical Problem List and Plan: 1.  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 Continues to make steady functional gains  -progressing toward dc goals 2.  DVT Prophylaxis/Anticoagulation: Dopplers with persistent DVT in right and left posterior tibial veins. Other clots have resolved  -converted to xarelto and re-check dopplers as outpt in a month to 6 weeks- 20mg  daily 3. Pain Management: Robaxin and oxycodone as needed 4. Mood: Provide emotional support 5. Neuropsych: This patient is capable of making decisions on his own behalf. 6. Skin/Wound Care: central area of eschar with decreased drainage   -dry dressing daily, wife knows how to dress  -he may shower 7. Fluids/Electrolytes/Nutrition:    -encourage PO 8. Acute blood loss anemia.  hgb stable 10.7 9. OSA. CPAP 10. Hypertension. HCTZ 12.5 mg daily, Toprol-XL 50 mg daily, Avapro 300 mg daily. Monitor with increased mobility 11. Diabetes mellitus with peripheral neuropathy. Hemoglobin A1c 6.0.SSI.  Check blood sugars before meals and at bedtime. Patient on Glucophage 1000 mg twice a day prior to admission.   -Resumed glucophage at 1000mg  bid    -continue diet carb mod.   -sugars improved  l    12. History of CVA. Plavix currently on hold (would continue to hold while on xarelto). ?further lumbar surgery? 13. Hyperlipidemia. Lipitor 14. Neurogenic bowel:  continent.   -miralax prn  -continued scheduled senokot-s 15. Tobacco abuse: Counsel   LOS (Days) [place] T, MD 07/18/2016 9:54 AM

## 2016-07-18 NOTE — Progress Notes (Signed)
Physical Therapy Discharge Summary  Patient Details  Name: Rodney Crane MRN: [id number] Date of Birth: May 18, 1950  Patient has met 9 of 9 long term goals due to improved activity tolerance, improved balance, improved postural control, increased strength, decreased pain, ability to compensate for deficits, functional use of  right upper extremity, right lower extremity, left upper extremity and left lower extremity and improved coordination.  Patient to discharge at an ambulatory level Supervision.   Patient's care partner is independent to provide the necessary physical assistance at discharge. Pt's wife successfully completed family education.  Reasons goals not met: All goals met  Recommendation:  Patient will benefit from ongoing skilled PT services in outpatient setting to continue to advance safe functional mobility, address ongoing impairments in strength, coordination, balance, activity tolerance, and minimize fall risk.  Equipment: RW  Reasons for discharge: treatment goals met and discharge from hospital  Patient/family agrees with progress made and goals achieved: Yes  PT Discharge Precautions/Restrictions Precautions Precautions: Cervical;Fall Required Braces or Orthoses: Cervical Brace Cervical Brace: Hard collar;At all times Vision/Perception  Perception Comments: WFL  Cognition Overall Cognitive Status: Within Functional Limits for tasks assessed Arousal/Alertness: Awake/alert Orientation Level: Oriented X4 Attention: Sustained Sustained Attention: Appears intact Memory: Appears intact Awareness: Appears intact Problem Solving: Appears intact Safety/Judgment: Appears intact Sensation Sensation Light Touch: Impaired by gross assessment Light Touch Impaired Details: Impaired RUE;Impaired LUE;Impaired RLE Proprioception: Impaired Detail Proprioception Impaired Details: Impaired RLE Additional Comments: RLE impairments d/t hx of CVA Coordination Gross Motor  Movements are Fluid and Coordinated: No Heel Shin Test: decreased speed/excursion Motor  Motor Motor - Discharge Observations: tetraparesis improving; RLE paresis suspected residual from previous CVA  Mobility Bed Mobility Bed Mobility: Supine to Sit;Sit to Supine Supine to Sit: 6: Modified independent (Device/Increase time) Sit to Supine: 6: Modified independent (Device/Increase time) Transfers Transfers: Yes Sit to Stand: 5: Supervision Sit to Stand Details: Verbal cues for precautions/safety Stand to Sit: 5: Supervision Stand to Sit Details (indicate cue type and reason): Verbal cues for precautions/safety Stand Pivot Transfers: 5: Supervision;With armrests Stand Pivot Transfer Details: Verbal cues for precautions/safety Squat Pivot Transfers: 6: Modified independent (Device/Increase time) Locomotion  Ambulation Ambulation: Yes Ambulation/Gait Assistance: 5: Supervision Ambulation Distance (Feet): 300 Feet Assistive device: Rolling walker;Other (Comment) (RLE foot-up orthosis) Ambulation/Gait Assistance Details: Verbal cues for precautions/safety;Verbal cues for gait pattern Gait Gait: Yes Gait Pattern: Narrow base of support;Poor foot clearance - right;Trendelenburg Gait velocity: 1.1 ft/sec Stairs / Additional Locomotion Stairs: Yes Stairs Assistance: 5: Supervision Stair Management Technique: Two rails;Step to pattern;Forwards Number of Stairs: 8 Height of Stairs: 6 Ramp: 5: Supervision Curb: 5: Psychiatric nurse: Yes Wheelchair Assistance: 6: Modified independent (Device/Increase time) Environmental health practitioner: Both lower extermities Wheelchair Parts Management: Independent  Trunk/Postural Assessment  Cervical Assessment Cervical Assessment: Exceptions to [facility] (restricted d/t cervical precautions, collar) Thoracic Assessment Thoracic Assessment: Within Functional Limits Lumbar Assessment Lumbar Assessment: Exceptions to [facility]  (posterior pelvic tilt, reduced lumbar lordosis) Postural Control Postural Control: Deficits on evaluation Righting Reactions: delayed/inefficient stepping strategies  Balance Balance Balance Assessed: Yes Static Sitting Balance Static Sitting - Balance Support: No upper extremity supported;Feet supported Static Sitting - Level of Assistance: 6: Modified independent (Device/Increase time) Dynamic Sitting Balance Dynamic Sitting - Balance Support: No upper extremity supported;Feet supported Dynamic Sitting - Level of Assistance: 6: Modified independent (Device/Increase time) Static Standing Balance Static Standing - Balance Support: Bilateral upper extremity supported Static Standing - Level of Assistance: 5: Stand by assistance Dynamic Standing Balance Dynamic Standing -  Balance Support: Bilateral upper extremity supported Dynamic Standing - Level of Assistance: 5: Stand by assistance Dynamic Standing - Balance Activities: Lateral lean/weight shifting;Forward lean/weight shifting;Reaching for objects Extremity Assessment  RUE Assessment RUE Assessment: Within Functional Limits [facility] for functional mobility tasks; defer to OT exam for details) LUE Assessment LUE Assessment: Within Functional Limits [facility] for functional mobility tasks; defer to OT exam for detail) RLE Assessment RLE Assessment: Exceptions to [facility] RLE Strength Right Hip Flexion: 4-/5 Right Knee Flexion: 4/5 Right Knee Extension: 3/5 Right Ankle Dorsiflexion: 1/5 Right Ankle Plantar Flexion: 3/5 LLE Assessment LLE Assessment: Exceptions to [facility] LLE Strength Left Hip Flexion: 4+/5 Left Knee Flexion: 4/5 Left Knee Extension: 4/5 Left Ankle Dorsiflexion: 4/5 Left Ankle Plantar Flexion: 4/5   See Function Navigator for Current Functional Status.  Benjiman Core Tygielski 07/18/2016, 11:46 PM   Lars Masson, PT, DPT 12:08 PM 07/19/16   Lavone Nian, PT, DPT 07/19/16 5:20 PM

## 2016-07-18 NOTE — Progress Notes (Signed)
Occupational Therapy Session Note  Patient Details  Name: Rodney Crane MRN: [id number] Date of Birth: June 29, 1950  Today's Date: 07/18/2016 OT Individual Time: 0705-0800 OT Individual Time Calculation (min): 55 min    Short Term Goals: Week 2:  OT Short Term Goal 1 (Week 2): STG=LTG due to LOS  Skilled Therapeutic Interventions/Progress Updates:    Pt seen for OT ADL bathing/dressing session. Pt sitting up in w/c upon arrival having already gathered items in prep for showering task. He self propelled w/c to ADL apartment and ambulated into bathroom, copleting tub/shower transfer utilizing tub bench with supervision. He bathed seated on bench following set-up assist, lateral leans for buttock hygiene. He dressed seated in w/c with increased time, sit <> stand at RW to pull pants up with increased time due to impaired sensation in B UEs.  Pt returned to room at end of session, left seated in w/c with all needs in reach.  Throughout session discussed d/c planning, reducing risk of fall, DME, energy conservation and purpose of TEDs. Pt voicing feeling ready and excited for planned d/c home Wednesday.   Therapy Documentation Precautions:  Precautions Precautions: Cervical, Fall Required Braces or Orthoses: Cervical Brace Cervical Brace: Hard collar, At all times Restrictions Weight Bearing Restrictions: No Pain:   No/ denies pain ADL: ADL ADL Comments: see functional navigator  See Function Navigator for Current Functional Status.   Therapy/Group: Individual Therapy  [name], Gwinda Passe 07/18/2016, 6:37 AM

## 2016-07-18 NOTE — Progress Notes (Signed)
Physical Therapy Session Note  Patient Details  Name: Rodney Crane MRN: [id number] Date of Birth: Jan 30, 1951  Today's Date: 07/18/2016 PT Individual Time: 0900-1000 and 1400-1430 PT Individual Time Calculation (min): 60 min and 30 min (total 90 min)   Short Term Goals: Week 2:  PT Short Term Goal 1 (Week 2): =LTG due to estimated LOS  Skilled Therapeutic Interventions/Progress Updates: Tx 1: Pt received seated in w/c with wife present, denies pain and agreeable to treatment. Discussed DME needs briefly with pt and wife; will need bariatric w/c, and due to insurance not covering w/c, pt will not get one. Gait to gym x150' with RW and S; continued trendelenburg RLE, however improving gait speed, step length and symmetry and foot clearance RLE. 6 minute walk test total 323' with RW and S; one short standing rest break. Significantly decreased gait speed/endurance compared to age norms, 37' norm for age. Gait in community environment in gift shop to practice managing RW in tight spaces and with other people around. 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 Gait x150' with RW and S to return to room. Pt remained seated in w/c at end of session, assisted with pt calling wife prior to leaving; all needs in reach.   Tx 2: Pt received in restroom; sit >stand with RW and S with several attempts d/t low toilet seat height, gait out of bathroom with RW and S. W/c propulsion to/from gym with BLE for strengthening and coordination. Stand pivot transfer w/c <>mat table with RW and S. Backward stepping strategies trained with therapist applying external perturbation both announced and unannounced; cueing for larger step length and efficiency, improved with repetition. Rest breaks as needed d/t fatigue. Discussed plan  with pt/wife to have wife observe PT session tomorrow. Returned to room as above w/c propulsion modI at end of session, wife present.      Therapy Documentation Precautions:  Precautions Precautions: Cervical, Fall Required Braces or Orthoses: Cervical Brace Cervical Brace: Hard collar, At all times Restrictions Weight Bearing Restrictions: No   See Function Navigator for Current Functional Status.   Therapy/Group: Individual Therapy  [name] 07/18/2016, 9:58 AM

## 2016-07-18 NOTE — Progress Notes (Signed)
Occupational Therapy Session Note  Patient Details  Name: Rodney Crane MRN: [id number] Date of Birth: 02/17/1951  Today's Date: 07/18/2016 OT Individual Time: 1100-1158 OT Individual Time Calculation (min): 58 min    Short Term Goals: Week 1:  OT Short Term Goal 1 (Week 1): Pt will sit unsupported to bathe and dress UB with close supervision OT Short Term Goal 1 - Progress (Week 1): Met OT Short Term Goal 2 (Week 1): Pt will transfer bed to w/c with mod A +1 in prep for ADL tasks  OT Short Term Goal 2 - Progress (Week 1): Met OT Short Term Goal 3 (Week 1): Pt will perform sit to stand for clothing management with mod A  OT Short Term Goal 3 - Progress (Week 1): Met OT Short Term Goal 4 (Week 1): Pt will perform bed mobility with mod A with bed rails in prep for ADL task OT Short Term Goal 4 - Progress (Week 1): Met Week 2:  OT Short Term Goal 1 (Week 2): STG=LTG due to LOS  Skilled Therapeutic Interventions/Progress Updates:  Pt was sitting in w/c at time of arrival, ready to go. Tx focus on dynamic balance and bilateral UE FMC. PT self propelled to gym for UB strengthening. Once in gym he participated in balance activities on wii balance board. He required min guard while weight shifting L<R and rest breaks after 5 minute intervals of activity. Afterwards, pt engaged in FMC game to work on coordination/sensation deficits of both hands. Discussed using this game at home with spouse in order to continue remediation of FM skills. At end of session pt ambulated back to room with min guard-supervision and RW. He was left in w/c with all needs within reach at time of departure.      Therapy Documentation Precautions:  Precautions Precautions: Cervical, Fall Required Braces or Orthoses: Cervical Brace Cervical Brace: Hard collar, At all times Restrictions Weight Bearing Restrictions: No General:   Vital Signs:   Pain: No c/o pain during session    ADL: ADL ADL Comments: see  functional navigator    See Function Navigator for Current Functional Status.   Therapy/Group: Individual Therapy  Michaela A Hoffman 07/18/2016, 12:48 PM  

## 2016-07-19 ENCOUNTER — Inpatient Hospital Stay (HOSPITAL_COMMUNITY): Payer: Medicare Other

## 2016-07-19 ENCOUNTER — Inpatient Hospital Stay (HOSPITAL_COMMUNITY): Payer: Medicare Other | Admitting: Physical Therapy

## 2016-07-19 ENCOUNTER — Inpatient Hospital Stay (HOSPITAL_COMMUNITY): Payer: Medicare Other | Admitting: Occupational Therapy

## 2016-07-19 LAB — GLUCOSE, CAPILLARY
GLUCOSE-CAPILLARY: 155 mg/dL — AB (ref 65–99)
Glucose-Capillary: 118 mg/dL — ABNORMAL HIGH (ref 65–99)
Glucose-Capillary: 124 mg/dL — ABNORMAL HIGH (ref 65–99)
Glucose-Capillary: 154 mg/dL — ABNORMAL HIGH (ref 65–99)

## 2016-07-19 MED ORDER — FUROSEMIDE 20 MG PO TABS
20.0000 mg | ORAL_TABLET | Freq: Every day | ORAL | Status: DC
  Administered 2016-07-19: 20 mg via ORAL
  Filled 2016-07-19 (×2): qty 1

## 2016-07-19 MED ORDER — POTASSIUM CHLORIDE CRYS ER 20 MEQ PO TBCR
20.0000 meq | EXTENDED_RELEASE_TABLET | Freq: Every day | ORAL | Status: DC
  Administered 2016-07-19: 20 meq via ORAL
  Filled 2016-07-19 (×2): qty 1

## 2016-07-19 NOTE — Progress Notes (Signed)
Occupational Therapy Discharge Summary  Patient Details  Name: Rodney Crane MRN: [id number] Date of Birth: 21-Apr-1951   Patient has met 10 of 10 long term goals due to improved activity tolerance, improved balance, functional use of  RIGHT upper and LEFT upper extremity and improved coordination.  Patient to discharge at overall Supervision level.  Patient's care partner is independent to provide the necessary physical assistance at discharge.   Pt's wife present throughout CIR stay. Hands on family education completed with pt's wife voicing and demonstrating ability to provide needed assist at d/Crane.   Recommendation:  Patient will benefit from ongoing skilled OT services in home health setting to continue to advance functional skills in the area of BADL and iADL.  Equipment: tub transfer bench and 3-1 BSC  Reasons for discharge: treatment goals met and discharge from hospital  Patient/family agrees with progress made and goals achieved: Yes  OT Discharge Precautions/Restrictions  Precautions Precautions: Cervical;Fall Required Braces or Orthoses: Cervical Brace Cervical Brace: Hard collar Restrictions Weight Bearing Restrictions: No Other Position/Activity Restrictions: Per MD order pt allowed to doff brace to eat meals ADL ADL ADL Comments: see functional navigator Vision/Perception  Vision- History Baseline Vision/History: Wears glasses Wears Glasses: At all times Patient Visual Report: No change from baseline Vision- Assessment Vision Assessment?: No apparent visual deficits  Cognition Overall Cognitive Status: Within Functional Limits for tasks assessed Arousal/Alertness: Awake/alert Orientation Level: Oriented X4 Sustained Attention: Appears intact Memory: Appears intact Awareness: Appears intact Problem Solving: Appears intact Safety/Judgment: Appears intact Sensation Sensation Light Touch: Impaired by gross assessment Light Touch Impaired Details: Impaired  RUE;Impaired LUE;Impaired RLE Additional Comments: Pt reports diminished sensation fingers to elbow on B UEs, however, improved since admission Coordination Gross Motor Movements are Fluid and Coordinated: No Fine Motor Movements are Fluid and Coordinated: No Coordination and Movement Description: Decreased finger opposition 9 Hole Peg Test: R: 1 min 37 sec L: 1 min 30 sec  Motor  Motor Motor: Tetraplegia Motor - Discharge Observations: tetraparesis, however, greatly improved since admission Trunk/Postural Assessment  Cervical Assessment Cervical Assessment: Exceptions to [facility] (Hard collar; cervical restrictions) Thoracic Assessment Thoracic Assessment: Within Functional Limits Lumbar Assessment Lumbar Assessment: Exceptions to [facility] (Posterior pelvic tilt) Postural Control Postural Control: Deficits on evaluation  Balance Balance Balance Assessed: Yes Dynamic Sitting Balance Dynamic Sitting - Balance Support: No upper extremity supported;Feet supported;During functional activity Dynamic Sitting - Level of Assistance: 6: Modified independent (Device/Increase time) Static Standing Balance Static Standing - Balance Support: During functional activity Static Standing - Level of Assistance: 5: Stand by assistance Dynamic Standing Balance Dynamic Standing - Balance Support: During functional activity;No upper extremity supported Dynamic Standing - Level of Assistance: 5: Stand by assistance Dynamic Standing - Comments: Standing to complete LB clothing management Extremity/Trunk Assessment RUE Assessment RUE Assessment: Within Functional Limits RUE Strength RUE Overall Strength: Within Functional Limits for tasks performed RUE Overall Strength Comments: WFL for tasks assessed; 4/5 throughout; slightly impaired gross grasp strength LUE Assessment LUE Assessment: Within Functional Limits LUE Strength LUE Overall Strength: Within Functional Limits for tasks assessed LUE Overall  Strength Comments: WFL for tasks assessed; 4/5 throughout; slightly impaired gross grasp strength   See Function Navigator for Current Functional Status.  Rodney Crane, Rodney Crane 07/19/2016, 3:40 PM

## 2016-07-19 NOTE — Progress Notes (Signed)
Occupational Therapy Session Note  Patient Details  Name: Rodney Crane MRN: [id number] Date of Birth: 1951/04/19  Today's Date: 07/19/2016 OT Individual Time: 0830-1000 OT Individual Time Calculation (min): 90 min    Short Term Goals: Week 2:  OT Short Term Goal 1 (Week 2): STG=LTG due to LOS  Skilled Therapeutic Interventions/Progress Updates:    Pt seen for OT ADL bathing/dressing session. Pt sitting up in w/c upon arrival, RN and wife present, RN administering morning meds. Pt agreeable to tx session. He bathed in ADL apartment utilzing tub transfer bench in simulation to home environment. Education and demonstration provided to pt's wife regarding setting up of tub transfer bench. He bathed with distant supervision in sitting position. He returned to w/c to completed dressing task, supervision sit<> stand at [facility].  He self propelled w/c to therapy gym. Completed 9 hole peg test, see results below. Pt returned to room at end of session, left seated in w/c with all needs in reach.  Reviewed with pt and wife regarding cervical pre-cautions and functional implications. Recommended pt's wife assist with shaving due to limited cervical mobility- all voiced understanding.   9 Hole Peg Test:  R: 1 min 28 sec and 1 min 47 L:  1 min 8 sec and 1 min 52 sec   Therapy Documentation Precautions:  Precautions Precautions: Cervical, Fall Required Braces or Orthoses: Cervical Brace Cervical Brace: Hard collar, At all times Restrictions Weight Bearing Restrictions: No Pain: Pain Assessment Pain Assessment: 0-10 Pain Score: 3  Pain Type: Chronic pain Pain Location: Back Pain Descriptors / Indicators: Aching Pain Onset: On-going Pain Intervention(s): RN made aware (medicated by previous RN) ADL: ADL ADL Comments: see functional navigator  See Function Navigator for Current Functional Status.   Therapy/Group: Individual Therapy  [name], Alycea Segoviano C 07/19/2016, 7:15 AM

## 2016-07-19 NOTE — Discharge Instructions (Signed)
Inpatient Rehab Discharge Instructions  Rodney Crane Discharge date and time: No discharge date for patient encounter.   Activities/Precautions/ Functional Status: Activity: Cervical collar at all times Diet: Diabetic diet Wound Care: Keep wound clean and dry Functional status:  ___ No restrictions     ___ Walk up steps independently ___ 24/7 supervision/assistance   ___ Walk up steps with assistance ___ Intermittent supervision/assistance  ___ Bathe/dress independently ___ Walk with walker     _x__ Bathe/dress with assistance ___ Walk Independently    ___ Shower independently ___ Walk with assistance    ___ Shower with assistance ___ No alcohol     ___ Return to work/school ________    COMMUNITY REFERRALS UPON DISCHARGE:    Home Health:   PT     OT    RN                     Agency: [facility]     Phone: [phone]   Medical Equipment/Items Ordered:  Rolling walker, commode and tub bench                                                      Agency/Supplier:  [facility] @ [phone]     Special Instructions: Xarelto for recent findings of DVT. Plan outpatient vascular study in approximately 4 to 6 weeks   My questions have been answered and I understand these instructions. I will adhere to these goals and the provided educational materials after my discharge from the hospital.  Patient/Caregiver Signature _______________________________ Date __________  Clinician Signature _______________________________________ Date __________  Please bring this form and your medication list with you to all your follow-up doctor's appointments. Rivaroxaban oral tablets What is this medicine? RIVAROXABAN (ri va ROX a ban) is an anticoagulant (blood thinner). It is used to treat blood clots in the lungs or in the veins. It is also used after knee or hip surgeries to prevent blood clots. It is also used to lower the chance of stroke in people with a medical condition called  atrial fibrillation. This medicine may be used for other purposes; ask your health care provider or pharmacist if you have questions. COMMON BRAND NAME(S): Xarelto, Xarelto Starter Pack What should I tell my health care provider before I take this medicine? They need to know if you have any of these conditions: -bleeding disorders -bleeding in the brain -blood in your stools (black or tarry stools) or if you have blood in your vomit -history of stomach bleeding -kidney disease -liver disease -low blood counts, like low white cell, platelet, or red cell counts -recent or planned spinal or epidural procedure -take medicines that treat or prevent blood clots -an unusual or allergic reaction to rivaroxaban, other medicines, foods, dyes, or preservatives -pregnant or trying to get pregnant -breast-feeding How should I use this medicine? Take this medicine by mouth with a glass of water. Follow the directions on the prescription label. Take your medicine at regular intervals. Do not take it more often than directed. Do not stop taking except on your doctor's advice. Stopping this medicine may increase your risk of a blood clot. Be sure to refill your prescription before you run out of medicine. If you are taking this medicine after hip or knee replacement surgery, take it with or without food.  If you are taking this medicine for atrial fibrillation, take it with your evening meal. If you are taking this medicine to treat blood clots, take it with food at the same time each day. If you are unable to swallow your tablet, you may crush the tablet and mix it in applesauce. Then, immediately eat the applesauce. You should eat more food right after you eat the applesauce containing the crushed tablet. Talk to your pediatrician regarding the use of this medicine in children. Special care may be needed. Overdosage: If you think you have taken too much of this medicine contact a poison control center or  emergency room at once. NOTE: This medicine is only for you. Do not share this medicine with others. What if I miss a dose? If you take your medicine once a day and miss a dose, take the missed dose as soon as you remember. If you take your medicine twice a day and miss a dose, take the missed dose immediately. In this instance, 2 tablets may be taken at the same time. The next day you should take 1 tablet twice a day as directed. What may interact with this medicine? Do not take this medicine with any of the following medications: -defibrotide This medicine may also interact with the following medications: -aspirin and aspirin-like medicines -certain antibiotics like erythromycin, azithromycin, and clarithromycin -certain medicines for fungal infections like ketoconazole and itraconazole -certain medicines for irregular heart beat like amiodarone, quinidine, dronedarone -certain medicines for seizures like carbamazepine, phenytoin -certain medicines that treat or prevent blood clots like warfarin, enoxaparin, and dalteparin -conivaptan -diltiazem -felodipine -indinavir -lopinavir; ritonavir -NSAIDS, medicines for pain and inflammation, like ibuprofen or naproxen -ranolazine -rifampin -ritonavir -SNRIs, medicines for depression, like desvenlafaxine, duloxetine, levomilnacipran, venlafaxine -SSRIs, medicines for depression, like citalopram, escitalopram, fluoxetine, fluvoxamine, paroxetine, sertraline -St. John's wort -verapamil This list may not describe all possible interactions. Give your health care provider a list of all the medicines, herbs, non-prescription drugs, or dietary supplements you use. Also tell them if you smoke, drink alcohol, or use illegal drugs. Some items may interact with your medicine. What should I watch for while using this medicine? Visit your doctor or health care professional for regular checks on your progress. Notify your doctor or health care professional  and seek emergency treatment if you develop breathing problems; changes in vision; chest pain; severe, sudden headache; pain, swelling, warmth in the leg; trouble speaking; sudden numbness or weakness of the face, arm or leg. These can be signs that your condition has gotten worse. If you are going to have surgery or other procedure, tell your doctor that you are taking this medicine. What side effects may I notice from receiving this medicine? Side effects that you should report to your doctor or health care professional as soon as possible: -allergic reactions like skin rash, itching or hives, swelling of the face, lips, or tongue -back pain -redness, blistering, peeling or loosening of the skin, including inside the mouth -signs and symptoms of bleeding such as bloody or black, tarry stools; red or dark-brown urine; spitting up blood or brown material that looks like coffee grounds; red spots on the skin; unusual bruising or bleeding from the eye, gums, or nose Side effects that usually do not require medical attention (report to your doctor or health care professional if they continue or are bothersome): -dizziness -muscle pain This list may not describe all possible side effects. Call your doctor for medical advice about side effects. You may  report side effects to FDA at 1-800-FDA-1088. Where should I keep my medicine? Keep out of the reach of children. Store at room temperature between 15 and 30 degrees C (59 and 86 degrees F). Throw away any unused medicine after the expiration date. NOTE: This sheet is a summary. It may not cover all possible information. If you have questions about this medicine, talk to your doctor, pharmacist, or health care provider.  2018 Elsevier/Gold Standard (2015-12-30 16:29:33)

## 2016-07-19 NOTE — Progress Notes (Signed)
Physical Therapy Session Note  Patient Details  Name: Rodney Crane MRN: [id number] Date of Birth: 06/24/1950  Today's Date: 07/19/2016 PT Individual Time: 9794-8016 PT Individual Time Calculation (min): 60 min   Short Term Goals: Week 2:  PT Short Term Goal 1 (Week 2): =LTG due to estimated LOS  Skilled Therapeutic Interventions/Progress Updates:    Session focused on family education to prepare for discharge with patient and patient's wife. Reviewed basic transfers with RW, simulated car transfer to sedan height, bed transfers and mobility in elevated bed in ADL apartment to simulate home, furniture transfers from low recliner with RW, stair negotiation (with rails and threshold step with RW with wife able to return demonstrate), and overall safety with mobility and energy conservation. Also educated on edema control and positioning of LE's when pt resting in chair/recliner vs bed and use of pillows. Pt at overall supervision level with RW. Neuro re-ed for end of session to address dynamic balance and functional use of RUE during bowling activity with 1 UE support for balance and squat position with overall min assist. Pt and wife deny concerns in regards to upcoming d/c.   Therapy Documentation Precautions:  Precautions Precautions: (P) Cervical, Fall Required Braces or Orthoses: (P) Cervical Brace Cervical Brace: (P) Hard collar, At all times Restrictions Weight Bearing Restrictions: No  Pain: No complaints of pain.    See Function Navigator for Current Functional Status.   Therapy/Group: Individual Therapy  Canary Brim Ivory Broad, PT, DPT  07/19/2016, 12:04 PM

## 2016-07-19 NOTE — Progress Notes (Signed)
Physical Therapy Session Note  Patient Details  Name: Stephfon Bovey MRN: [id number] Date of Birth: [birth year]  Today's Date: 07/19/2016 PT Individual Time: 6773-7366 PT Individual Time Calculation (min): 58 min   Short Term Goals: Week 2:  PT Short Term Goal 1 (Week 2): =LTG due to estimated LOS  Skilled Therapeutic Interventions/Progress Updates:  Pt received in bathroom. Pt's wife assisted pt off of toilet and to w/c with supervision and pt using RW. Pt propelled w/c room<>gym with BUE & BLE for endurance training. In gym pt engaged in fine motor tasks with BUE consisting of retrieving beans from putty. Pt progressed from tan to yellow putty for increased challenge. Pt utilized nu-step up to level 6 x 10 minutes with all four extremities and one rest break 2/2 fatigue. Task focused on endurance training. At end of session pt returned to room & left in w/c with all needs within reach.   Therapy Documentation Precautions:  Precautions Precautions: Cervical, Fall Required Braces or Orthoses: Cervical Brace Cervical Brace: Hard collar Restrictions Weight Bearing Restrictions: No Other Position/Activity Restrictions: Per MD order pt allowed to doff brace to eat meals  Pain: No c/o pain noted.   See Function Navigator for Current Functional Status.   Therapy/Group: Individual Therapy  Waunita Schooner 07/19/2016, 5:15 PM

## 2016-07-19 NOTE — Progress Notes (Signed)
Pt states that he can handle his CPAP on this own. Pt is stable at this time.

## 2016-07-19 NOTE — Progress Notes (Signed)
[facility] PHYSICAL MEDICINE & REHABILITATION     PROGRESS NOTE    Subjective/Complaints: Still complaining of swelling in his ankles and feet. No pain or warmth in legs.   ROS: pt denies nausea, vomiting, diarrhea, cough, shortness of breath or chest pain      Objective: Vital Signs: Blood pressure 127/79, pulse 88, temperature 97.8 F (36.6 C), temperature source Oral, resp. rate 18, height 6\' 4"  (1.93 m), weight 117 kg (258 lb), SpO2 100 %. No results found.  Recent Labs  07/17/16 0554  WBC 7.0  HGB 10.9*  HCT 33.1*  PLT 369   No results for input(s): NA, K, CL, GLUCOSE, BUN, CREATININE, CALCIUM in the last 72 hours.  Invalid input(s): CO CBG (last 3)   Recent Labs  07/18/16 1628 07/18/16 2106 07/19/16 0640  GLUCAP 154* 146* 118*    Wt Readings from Last 3 Encounters:  07/06/16 117 kg (258 lb)  07/02/16 133.7 kg (294 lb 11.2 oz)  06/23/16 130.2 kg (287 lb)    Physical Exam:  Constitutional: He is oriented to person, place, and time. He appears well-developed.  HENT:  Head: Normocephalic and atraumatic.  Eyes: Conjunctivae and EOM are normal. Left eye exhibits no discharge.  Neck:  Posterior neck incision with decreased drainage Cardiovascular: RRR Respiratory: CTA B. Normal effort GI: Soft. Bowel sounds are normal.  ND/NT, + bowel sounds  Musculoskeletal: He exhibits tr edema in upper and lower ext. 1+ ankles/feet Neurological: He is alert and oriented to person, place, and time.  Motor: RUE: Shoulder abduction 4-/5, elbow flexion/extension 4/5, wrist/hand 4 to 4+ to 5/5 LUE: Shoulder abduction 4-/5, elbow flexion/extension 4/5, wrist/hand 4+ to 5/5 RLE: HF, KE 3/5, ADF/PF   3/5 LLE: HF, KE 4-/5, ADF/PF 4/5 Sensation diminished to light touch in hands and feet--slowly improving DTRs symmetric   Skin: Skin is warm and dry. Incision with scab. No drainage/sutures Psychiatric: pleasant and cooperative.    Assessment/Plan: 1. 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 Physiatrist is providing close team supervision and 24 hour management of active medical problems listed below. Physiatrist and rehab team continue to assess barriers to discharge/monitor patient progress toward functional and medical goals.  Function:  Bathing Bathing position   Position: Shower  Bathing parts Body parts bathed by patient: Right arm, Left arm, Chest, Abdomen, Right upper leg, Left upper leg, Right lower leg, Left lower leg, Front perineal area, Buttocks, Back Body parts bathed by helper: Back  Bathing assist Assist Level: Set up   Set up : To obtain items  Upper Body Dressing/Undressing Upper body dressing   What is the patient wearing?: Pull over shirt/dress     Pull over shirt/dress - Perfomed by patient: Thread/unthread right sleeve, Thread/unthread left sleeve, Put head through opening, Pull shirt over trunk Pull over shirt/dress - Perfomed by helper: Pull shirt over trunk     Orthosis activity level: Performed by helper  Upper body assist Assist Level: More than reasonable time   Set up : To obtain clothing/put away  Lower Body Dressing/Undressing Lower body dressing   What is the patient wearing?: Pants, Shoes, AFO, Ted Hose, Underwear Underwear - Performed by patient: Thread/unthread right underwear leg, Thread/unthread left underwear leg, Pull underwear up/down Underwear - Performed by helper: Thread/unthread right underwear leg, Thread/unthread left underwear leg, Pull underwear up/down Pants- Performed by patient: Thread/unthread right pants leg, Thread/unthread left pants  leg, Pull pants up/down Pants- Performed by helper: Pull pants up/down   Non-skid slipper socks- Performed by helper: Don/doff right sock, Don/doff left sock     Shoes - Performed by patient: Don/doff right shoe, Don/doff left shoe,  Fasten right, Fasten left Shoes - Performed by helper: Don/doff right shoe, Don/doff left shoe AFO - Performed by patient: Don/doff right AFO AFO - Performed by helper: Don/doff right AFO   TED Hose - Performed by helper: Don/doff left TED hose, Don/doff right TED hose  Lower body assist Assist for lower body dressing: Supervision or verbal cues      Toileting Toileting Toileting activity did not occur:  (using urinal, no BM) Toileting steps completed by patient: Adjust clothing prior to toileting, Performs perineal hygiene, Adjust clothing after toileting Toileting steps completed by helper: Performs perineal hygiene, Adjust clothing after toileting Toileting Assistive Devices: Grab bar or rail  Toileting assist Assist level: Supervision or verbal cues   Transfers Chair/bed transfer   Chair/bed transfer method: Stand pivot Chair/bed transfer assist level: Supervision or verbal cues Chair/bed transfer assistive device: Armrests, Walker, Orthosis Mechanical lift: Ecologist Ambulation activity did not occur: Safety/medical concerns   Max distance: 323 Assist level: Supervision or verbal cues   Wheelchair   Type: Manual Max wheelchair distance: 100 Assist Level: Supervision or verbal cues  Cognition Comprehension Comprehension assist level: Follows complex conversation/direction with no assist  Expression Expression assist level: Expresses complex ideas: With no assist  Social Interaction Social Interaction assist level: Interacts appropriately with others - No medications needed.  Problem Solving Problem solving assist level: Solves complex problems: Recognizes & self-corrects  Memory Memory assist level: Complete Independence: No helper   Medical Problem List and Plan: 1.  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    -progressing toward dc tomorrow 2.  DVT Prophylaxis/Anticoagulation: Dopplers with persistent DVT in right and left posterior tibial veins. Other clots have resolved  -converted to xarelto and re-check dopplers as outpt in a month to 6 weeks- 20mg  daily  -reviewed edema mgt with patient. Will give him a dose lof lasix 20mg  today and tomorrow  -will tend to have some swelling associate with the DVT's and lower ext weakness 3. Pain Management: Robaxin and oxycodone as needed 4. Mood: Provide emotional support 5. Neuropsych: This patient is capable of making decisions on his own behalf. 6. Skin/Wound Care: central area of eschar with decreased drainage   -dry dressing daily, wife knows how to dress  -he may shower 7. Fluids/Electrolytes/Nutrition:    -encourage PO 8. Acute blood loss anemia.  hgb stable 10.7 9. OSA. CPAP 10. Hypertension. HCTZ 12.5 mg daily, Toprol-XL 50 mg daily, Avapro 300 mg daily. Monitor with increased mobility 11. Diabetes mellitus with peripheral neuropathy. Hemoglobin A1c 6.0.SSI.    Patient on Glucophage 1000 mg twice a day prior to admission.   -Resumed glucophage at 1000mg  bid    -continue diet carb mod.   -sugars improved      12. History of CVA. Plavix currently on hold (would continue to hold while on xarelto). ?further lumbar surgery? 13. Hyperlipidemia. Lipitor 14. Neurogenic bowel:  continent.   -miralax prn  -continued scheduled senokot-s 15. Tobacco abuse: Counsel   LOS (Days) 15 A FACE TO FACE EVALUATION WAS PERFORMED  Meredith Staggers, MD 07/19/2016 11:14 AM

## 2016-07-19 NOTE — Discharge Summary (Signed)
Discharge summary job [phone]

## 2016-07-19 NOTE — Discharge Summary (Signed)
Rodney Crane, COPADO NO.:  [id number]  MEDICAL RECORD NO.:  [id number]  LOCATION:  4M08C                        FACILITY:  [facility]  PHYSICIAN:  Meredith Staggers, M.D.DATE OF BIRTH:  [birth year]  DATE OF ADMISSION:  07/04/2016 DATE OF DISCHARGE:                              DISCHARGE SUMMARY   ANTICIPATED DISCHARGE DATE:  July 20, 2016.  DISCHARGE DIAGNOSES: 1. Decreased functional mobility secondary to central cord syndrome,     status post posterior cervical fusion with decompression on June 26, 2016. 2. Persistent deep vein thrombosis, right and left posterior tibial     veins, maintained on Xarelto. 3. Pain management. 4. Acute blood loss anemia. 5. Obstructive sleep apnea. 6. Hypertension. 7. Diabetes mellitus with peripheral neuropathy. 8. History of cerebrovascular accident. 9. Hyperlipidemia. 10.Neurogenic bowel. 11.Tobacco abuse.  HOSPITAL COURSE:  This is a 66 year old right-handed male with history of diabetes mellitus, CVA, hypertension, and cervical diskectomy in 2004 with multiple lumbar disk surgeries and tobacco abuse.  He lives with spouse, 1 level home; was independent until approximately 1 month ago with multiple falls; was using a rolling walker.  He presented on June 26, 2016, with progressive back and leg pain as well as multiple falls, followed by [facility]patient [facility] Services.  MRI showed spinal stenosis at C3-C4 as well as C2-C3.  Preoperative echocardiogram with ejection fraction of 65% and grade 1 diastolic dysfunction.  Underwent posterior cervical fusion at C2-C5, C2-C4 and decompression on June 26, 2016, per Dr. Rolena Infante.  Maintained on short-term intubation.  Acute blood loss anemia, he was transfused.  Hospital course, pain management.  Cervical collar in place.  Physical and occupational therapy ongoing.  The patient was admitted for a comprehensive rehab program.  PAST MEDICAL HISTORY:  See discharge  diagnoses.  SOCIAL HISTORY:  Lives with spouse.  Had been independent until recently.  FUNCTIONAL STATUS UPON ADMISSION TO REHAB SERVICES:  +2 physical assist, sit to stand; max assist, stand pivot transfers; max total assist, activities of daily living.  PHYSICAL EXAMINATION:  VITAL SIGNS:  Blood pressure 129/69, pulse 94, temperature 98, and respirations 18. GENERAL:  This was an alert male, oriented x3. HEENT:  EOMs intact. NECK:  Cervical collar in place. CARDIAC:  Regular rate and rhythm, without murmur. ABDOMEN:  Soft, nontender.  Good bowel sounds. LUNGS:  Clear to auscultation without wheeze. EXTREMITIES:  Right upper extremity:  Shoulder abduction 4-/5.  Elbow flexion and extension 4/5.  Wrist and hand 4+/5.  Left upper extremity: Shoulder abduction 4-/5.  Elbow flexion and extension 4/5.  Wrist and hand 4+/5, stronger than the right.  Right lower extremity:  2/5 knee extension, 2/5 ankle dorsi and plantar flexion.  Left lower extremity: 3+/5 knee extension, 4/5 ankle dorsi and plantar flexion.  REHABILITATION HOSPITAL COURSE:  The patient was admitted to inpatient rehab services with therapies initiated on a 3-hour daily basis, consisting of physical therapy, occupational therapy, and rehabilitation nursing.  The following issues were addressed during the patient's rehabilitation stay.  Pertaining to Mr. Dault' central cord syndrome, he had undergone posterior cervical fusion per Dr. Rolena Infante, cervical collar at all times.  He was also followed closely  for issues of lumbar stenosis, which would be addressed as an outpatient.  Routine venous Doppler study showed persistent DVT right and left posterior tibial veins, initially on Lovenox, transitioned to Xarelto that he would remain on as an outpatient with plan to recheck Dopplers in approximately 1 month to 6 weeks.  The patient had been on Plavix prior to admission for history of CVA, this was held while on Xarelto.   Pain management ongoing with the use of Robaxin and oxycodone as needed. Blood pressure is well controlled on present regimen with no orthostatic hypotension.  He did have a history of diabetes mellitus and peripheral neuropathy, hemoglobin A1c of 6.  He was on Glucophage 500 mg b.i.d., this was adjusted as needed.  He continued on Lipitor for hyperlipidemia.  Bowels with some constipation, resolved with laxative assistance.  The patient did have a history of tobacco abuse.  He received full counsel in regard to cessation of nicotine products.  The patient received weekly collaborative interdisciplinary team conferences to discuss estimated length of stay, family teaching, any barriers to his discharge.  He was ambulating 150 feet rolling walker, supervision with excellent overall progress.  Much improved gait speed, step length. Ambulates in the community to the gift shop with his assistive device. Discussed all obstacles and safety issues.  Activities of daily living and homemaking; participated in balance activities, bathing, dressing, grooming, and hygiene, required some minimal assist for lower body dressing.  It was discussed, no driving.  Full family teaching was completed and planned discharge to home.  DISCHARGE MEDICATIONS: 1. Lipitor 20 mg p.o. daily. 2. Hydrochlorothiazide 12.5 mg p.o. daily. 3. Avapro 300 mg p.o. daily. 4. Glucophage 500 mg b.i.d. 5. Toprol-XL 50 mg p.o. daily. 6. Protonix 40 mg p.o. daily. 7. MiraLax daily. 8. Xarelto 20 mg p.o. daily. 9. Robaxin 500 mg p.o. every 6 hours as needed for muscle spasms. 10.Oxycodone immediate release 10 mg p.o. every 4 hours as needed for     pain.  DIET:  Diabetic diet.  SPECIAL INSTRUCTIONS:  Cervical collar at all times.  The patient should receive followup Dopplers in 1 month to 6 weeks for monitoring of DVTs. Hold Plavix while on Xarelto.  FOLLOWUP:  The patient should follow up with Dr. Alger Simons at  the Outpatient Rehab Service office as directed; Dr. Rolena Infante, Orthopedic Services, call for appointment; and Dr. Marton Redwood, medical management.     Lauraine Rinne, P.A.   ______________________________ Meredith Staggers, M.D.    DA/MEDQ  D:  07/19/2016  T:  07/19/2016  Job:  [id number]  cc:   Duane Lope D. Rolena Infante, M.D. Marton Redwood, M.D. Meredith Staggers, M.D.

## 2016-07-20 LAB — CBC
HCT: 31.6 % — ABNORMAL LOW (ref 39.0–52.0)
HEMOGLOBIN: 10.3 g/dL — AB (ref 13.0–17.0)
MCH: 29.3 pg (ref 26.0–34.0)
MCHC: 32.6 g/dL (ref 30.0–36.0)
MCV: 90 fL (ref 78.0–100.0)
PLATELETS: 335 10*3/uL (ref 150–400)
RBC: 3.51 MIL/uL — AB (ref 4.22–5.81)
RDW: 13.6 % (ref 11.5–15.5)
WBC: 7.9 10*3/uL (ref 4.0–10.5)

## 2016-07-20 LAB — GLUCOSE, CAPILLARY: GLUCOSE-CAPILLARY: 122 mg/dL — AB (ref 65–99)

## 2016-07-20 MED ORDER — METOPROLOL SUCCINATE ER 50 MG PO TB24: 50.0000 mg | ORAL_TABLET | Freq: Every day | ORAL | 1 refills | Status: DC

## 2016-07-20 MED ORDER — RIVAROXABAN 20 MG PO TABS: 20.0000 mg | ORAL_TABLET | Freq: Every day | ORAL | 1 refills | Status: DC

## 2016-07-20 MED ORDER — FUROSEMIDE 20 MG PO TABS: 20.0000 mg | ORAL_TABLET | Freq: Every day | ORAL | 0 refills | Status: DC

## 2016-07-20 MED ORDER — VALSARTAN 320 MG PO TABS: 160.0000 mg | ORAL_TABLET | Freq: Every day | ORAL | 5 refills | Status: DC

## 2016-07-20 MED ORDER — ATORVASTATIN CALCIUM 40 MG PO TABS: 20.0000 mg | ORAL_TABLET | Freq: Every day | ORAL | 7 refills | Status: AC

## 2016-07-20 MED ORDER — PANTOPRAZOLE SODIUM 40 MG PO TBEC: 40.0000 mg | DELAYED_RELEASE_TABLET | Freq: Every day | ORAL | 1 refills | Status: DC

## 2016-07-20 MED ORDER — HYDROCHLOROTHIAZIDE 12.5 MG PO CAPS: 12.5000 mg | ORAL_CAPSULE | Freq: Every day | ORAL | 0 refills | Status: DC

## 2016-07-20 MED ORDER — METHOCARBAMOL 500 MG PO TABS: 500.0000 mg | ORAL_TABLET | Freq: Four times a day (QID) | ORAL | 0 refills | Status: DC | PRN

## 2016-07-20 MED ORDER — OXYCODONE HCL 10 MG PO TABS: 10.0000 mg | ORAL_TABLET | ORAL | 0 refills | Status: DC | PRN

## 2016-07-20 MED ORDER — POTASSIUM CHLORIDE CRYS ER 20 MEQ PO TBCR
20.0000 meq | EXTENDED_RELEASE_TABLET | Freq: Every day | ORAL | 1 refills | Status: DC
Start: 2016-07-20 — End: 2017-03-24

## 2016-07-20 MED ORDER — METFORMIN HCL 500 MG PO TABS: 500.0000 mg | ORAL_TABLET | Freq: Two times a day (BID) | ORAL | 1 refills | Status: DC

## 2016-07-20 NOTE — Progress Notes (Signed)
[facility]     PROGRESS NOTE    Subjective/Complaints: Pt doing well. Excited to go home  ROS: pt denies nausea, vomiting, diarrhea, cough, shortness of breath or chest pain       Objective: Vital Signs: Blood pressure (!) 131/58, pulse 84, temperature 98.2 F (36.8 C), temperature source Oral, resp. rate 20, height 6\' 4"  (1.93 m), weight 125.7 kg (277 lb 3.2 oz), SpO2 100 %. No results found.  Recent Labs  07/20/16 0552  WBC 7.9  HGB 10.3*  HCT 31.6*  PLT 335   No results for input(s): NA, K, CL, GLUCOSE, BUN, CREATININE, CALCIUM in the last 72 hours.  Invalid input(s): CO CBG (last 3)   Recent Labs  07/19/16 1735 07/19/16 2058 07/20/16 0656  GLUCAP 124* 154* 122*    Wt Readings from Last 3 Encounters:  07/20/16 125.7 kg (277 lb 3.2 oz)  07/02/16 133.7 kg (294 lb 11.2 oz)  06/23/16 130.2 kg (287 lb)    Physical Exam:  Constitutional: He is oriented to person, place, and time. He appears well-developed.  HENT:  Head: Normocephalic and atraumatic.  Eyes: Conjunctivae and EOM are normal. Left eye exhibits no discharge.  Neck:  Posterior neck incision with SL drainage Cardiovascular: RRR Respiratory: CTA B. Normal effort GI: Soft. Bowel sounds are normal.  ND/NT, + bowel sounds  Musculoskeletal: He exhibits tr edema in upper and lower ext. tr ankles/feet Neurological: He is alert and oriented to person, place, and time.  Motor: RUE: Shoulder abduction 4-/5, elbow flexion/extension 4/5, wrist/hand 4 to 4+ to 5/5 LUE: Shoulder abduction 4-/5, elbow flexion/extension 4/5, wrist/hand 4+ to 5/5 RLE: HF, KE 3/5, ADF/PF   4/5 LLE: HF, KE 4-/5, ADF/PF 4/5 Sensation diminished to light touch in hands and feet--slowly improving DTRs symmetric   Skin: Skin is warm and dry. Incision with scab. No drainage/sutures Psychiatric: pleasant and cooperative.    Assessment/Plan: 1. 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 Physiatrist is providing close team supervision and 24 hour management of active medical problems listed below. Physiatrist and rehab team continue to assess barriers to discharge/monitor patient progress toward functional and medical goals.  Function:  Bathing Bathing position   Position: Shower  Bathing parts Body parts bathed by patient: Right arm, Left arm, Chest, Abdomen, Right upper leg, Left upper leg, Right lower leg, Left lower leg, Front perineal area, Buttocks, Back Body parts bathed by helper: Back  Bathing assist Assist Level: Set up   Set up : To obtain items  Upper Body Dressing/Undressing Upper body dressing   What is the patient wearing?: Pull over shirt/dress     Pull over shirt/dress - Perfomed by patient: Thread/unthread right sleeve, Thread/unthread left sleeve, Put head through opening, Pull shirt over trunk Pull over shirt/dress - Perfomed by helper: Pull shirt over trunk     Orthosis activity level: Performed by helper  Upper body assist Assist Level: More than reasonable time   Set up : To obtain clothing/put away  Lower Body Dressing/Undressing Lower body dressing   What is the patient wearing?: Pants, Shoes, AFO, Ted Hose, Underwear Underwear - Performed by patient: Thread/unthread right underwear leg, Thread/unthread left underwear leg, Pull underwear up/down Underwear - Performed by helper: Thread/unthread right underwear leg, Thread/unthread left underwear leg, Pull underwear up/down Pants- Performed by patient: Thread/unthread right pants leg, Thread/unthread left pants leg, Pull pants  up/down Pants- Performed by helper: Pull pants up/down   Non-skid slipper socks- Performed by helper: Don/doff right sock, Don/doff left sock     Shoes - Performed by patient: Don/doff right shoe, Don/doff left shoe, Fasten right, Fasten  left Shoes - Performed by helper: Don/doff right shoe, Don/doff left shoe AFO - Performed by patient: Don/doff right AFO AFO - Performed by helper: Don/doff right AFO   TED Hose - Performed by helper: Don/doff left TED hose, Don/doff right TED hose  Lower body assist Assist for lower body dressing: Supervision or verbal cues      Toileting Toileting Toileting activity did not occur:  (using urinal, no BM) Toileting steps completed by patient: Adjust clothing prior to toileting, Performs perineal hygiene, Adjust clothing after toileting Toileting steps completed by helper: Performs perineal hygiene, Adjust clothing after toileting Toileting Assistive Devices: Grab bar or rail  Toileting assist Assist level: Supervision or verbal cues   Transfers Chair/bed transfer   Chair/bed transfer method: Stand pivot Chair/bed transfer assist level: Supervision or verbal cues Chair/bed transfer assistive device: Armrests, Walker, Orthosis Mechanical lift: Ecologist Ambulation activity did not occur: Safety/medical concerns   Max distance: 15 ft Assist level: Supervision or verbal cues   Wheelchair   Type: Manual Max wheelchair distance: 150' Assist Level: No help, No cues, assistive device, takes more than reasonable amount of time  Cognition Comprehension Comprehension assist level: Follows complex conversation/direction with no assist  Expression Expression assist level: Expresses complex ideas: With no assist  Social Interaction Social Interaction assist level: Interacts appropriately with others - No medications needed.  Problem Solving Problem solving assist level: Solves complex problems: Recognizes & self-corrects  Memory Memory assist level: Complete Independence: No helper   Medical Problem List and Plan: 1.  Decreased functional mobility secondary to central cord syndrome status post posterior cervical fusion C2-C5, C2-C4 decompression 06/26/2016 as well as  lumbar stenosis with planned surgical intervention  -dc home today  -Patient to see MD in the office for transitional care encounter in 1-2 weeks. 2.  DVT Prophylaxis/Anticoagulation: Dopplers with persistent DVT in right and left posterior tibial veins. Other clots have resolved  -continue xarelto and re-check dopplers as outpt in a month to 6 weeks- 20mg  daily  -reviewed edema mgt with patient.    -will tend to have some swelling associate with the DVT's and lower ext weakness 3. Pain Management: Robaxin and oxycodone as needed 4. Mood: Provide emotional support 5. Neuropsych: This patient is capable of making decisions on his own behalf. 6. Skin/Wound Care: central area of eschar with decreased drainage   -dry dressing daily, wife knows how to dress  -he may shower 7. Fluids/Electrolytes/Nutrition:    -encourage PO 8. Acute blood loss anemia.  hgb stable 10.7 9. OSA. CPAP 10. Hypertension. HCTZ 12.5 mg daily, Toprol-XL 50 mg daily, Avapro 300 mg daily. Monitor with increased mobility 11. Diabetes mellitus with peripheral neuropathy. Hemoglobin A1c 6.0.SSI.    Patient on Glucophage 1000 mg twice a day prior to admission.   -Resumed glucophage at 1000mg  bid    -continue diet carb mod.   -sugars improved      12. History of CVA. Plavix currently on hold (would continue to hold while on xarelto).   13. Hyperlipidemia. Lipitor 14. Neurogenic bowel:  continent.   -miralax prn  -continued scheduled senokot-s 15. Tobacco abuse: Counsel   LOS (Days) 16 A FACE TO FACE EVALUATION WAS PERFORMED  Meredith Staggers, MD 07/20/2016 9:10  AM    

## 2016-07-20 NOTE — Progress Notes (Signed)
Social Work  Discharge Note  The overall goal for the admission was met for:   Discharge location: Yes - home with wife who can provide 24/7 supervision  Length of Stay: Yes - 16 days  Discharge activity level: Yes - supervision overall  Home/community participation: Yes  Services provided included: MD, RD, PT, OT, RN, TR, Pharmacy and [place]: Medicare and Private Insurance: [employer] [place]  Follow-up services arranged: Home Health: RN, PT, OT via [facility], DME: bariatric rolling walker, bari bedside commode and tub bench via [facility] and Patient/Family has no preference for HH/DME agencies  Comments (or additional information):  Patient/Family verbalized understanding of follow-up arrangements: Yes  Individual responsible for coordination of the follow-up plan: pt  Confirmed correct DME delivered: Keyari Kleeman 07/20/2016    Kamylah Manzo

## 2016-07-20 NOTE — Patient Care Conference (Signed)
Inpatient RehabilitationTeam Conference and Plan of Care Update Date: 07/19/2016   Time: 2:00 PM    Patient Name: Rodney Crane      Medical Record Number: [id number]  Date of Birth: [birth year] Sex: Male         Room/Bed: 4M08C/4M08C-01 Payor Info: Payor: MEDICARE / Plan: MEDICARE PART A AND B / Product Type: *No Product type* /    Admitting Diagnosis: Central Cord Syndrome  Admit Date/Time:  07/04/2016  4:43 PM Admission Comments: No comment available   Primary Diagnosis:  Central cord syndrome (HCC) Principal Problem: Central cord syndrome [facility])  Patient Active Problem List   Diagnosis Date Noted  . Acute deep vein thrombosis (DVT) of distal vein of both lower extremities ([place]) 07/07/2016  . Abdominal distention   . Abdominal pain   . S/P cervical spinal fusion   . Type 2 diabetes mellitus with peripheral neuropathy (HCC)   . Hyperlipidemia   . Neurogenic bowel   . Myelopathy ([place])   . OSA on CPAP   . Surgery, elective   . History of CVA (cerebrovascular accident)   . Benign essential HTN   . History of cervical spinal surgery   . History of lumbar surgery   . Tobacco abuse   . Diastolic dysfunction   . Acute blood loss anemia   . Post-operative pain   . Tachycardia   . Leukocytosis   . Spinal stenosis of lumbar region   . Central cord syndrome ([place])   . Acute hypoxemic respiratory failure ([place])   . Spinal stenosis in cervical region 06/27/2016  . Cervical stenosis of spinal canal 06/25/2016  . Weakness of extremity 06/25/2016  . OSA (obstructive sleep apnea)   . Hypertension   . Diabetes ([place])   . DYSPNEA ON EXERTION 10/06/2009  . Nonspecific (abnormal) findings on radiological and other examination of body structure 10/06/2009  . CT, CHEST, ABNORMAL 10/06/2009  . COLONIC POLYPS 10/05/2009  . DIABETES, TYPE 2 10/05/2009  . HYPERLIPIDEMIA 10/05/2009  . 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 Essential hypertension 10/05/2009  . CVA 10/05/2009  . 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 Alger Simons Social Worker Present: Lennart Pall, LCSW Nurse Present: Heather Betzler, RN PT Present: Canary Brim, PT OT Present: Roanna Epley, [place];Napoleon Form, OT SLP Present: Stormy Fabian, SLP PPS Coordinator present : Daiva Nakayama, RN, CRRN     Current Status/Progress Goal Weekly Team Focus  Medical   making excellent functional gains. pain improved. neck wound healing. still some swelling in legs/ankles  prepare medically for discharge  edema control, wound care   Bowel/Bladder   Continent of B & B, LBM 07/18/16  Min assist  continue with plan of care   Swallow/Nutrition/ Hydration             ADL's   Supervision Overall  Upgraded to supervision overall  Family ed, d/c planning, pt with planned d/c home tomorrow   Mobility   S overall, pt/family education  upgraded to S overall ambulatory in home, added stair goal at S for strengthening  pt/family education to prepare for d/c, dynamic standing balance, activity tolerance   Communication             Safety/Cognition/ Behavioral Observations            Pain   pain level 4; after medication 2  less than 2  continue plan of care  Skin   skin is intact; incision to posterior neck is CDI  skin to remain free from breakdown  continue plan of care      *See Care Plan and progress notes for long and short-term goals.  Barriers to Discharge: n/a    Possible Resolutions to Barriers:  n/a    Discharge Planning/Teaching Needs:  Home with wife who can provide supervision to light assistance.  TEaching completed with wife.   Team Discussion:  Pt reaching goals and ready for d/c tomorrow.  All family ed completed.  No concerns and recommend to start with [facility] follow up.  Revisions to Treatment Plan:  None   Continued Need for Acute Rehabilitation Level of Care: The patient requires daily  medical management by a physician with specialized training in physical medicine and rehabilitation for the following conditions: Daily direction of a multidisciplinary physical rehabilitation program to ensure safe treatment while eliciting the highest outcome that is of practical value to the patient.: Yes Daily medical management of patient stability for increased activity during participation in an intensive rehabilitation regime.: Yes Daily analysis of laboratory values and/or radiology reports with any subsequent need for medication adjustment of medical intervention for : Post surgical problems;Neurological problems  Yitzchak Kothari 07/20/2016, 10:24 AM

## 2016-07-20 NOTE — Progress Notes (Signed)
Pt d/c to home via personal vehicle with wife at side.  TLSO brace on properly, wife verbalizes understanding of when to remove brace and care for incision.  D/C instructions provided to pt via PAC.  Equipment rec/d prior to d/c.

## 2016-07-22 DIAGNOSIS — I82443 Acute embolism and thrombosis of tibial vein, bilateral: Secondary | ICD-10-CM | POA: Diagnosis not present

## 2016-07-22 DIAGNOSIS — G4733 Obstructive sleep apnea (adult) (pediatric): Secondary | ICD-10-CM | POA: Diagnosis not present

## 2016-07-22 DIAGNOSIS — Z8673 Personal history of transient ischemic attack (TIA), and cerebral infarction without residual deficits: Secondary | ICD-10-CM | POA: Diagnosis not present

## 2016-07-22 DIAGNOSIS — Z87891 Personal history of nicotine dependence: Secondary | ICD-10-CM | POA: Diagnosis not present

## 2016-07-22 DIAGNOSIS — I10 Essential (primary) hypertension: Secondary | ICD-10-CM | POA: Diagnosis not present

## 2016-07-22 DIAGNOSIS — S14129D Central cord syndrome at unspecified level of cervical spinal cord, subsequent encounter: Secondary | ICD-10-CM | POA: Diagnosis not present

## 2016-07-22 DIAGNOSIS — M48062 Spinal stenosis, lumbar region with neurogenic claudication: Secondary | ICD-10-CM | POA: Diagnosis not present

## 2016-07-22 DIAGNOSIS — G8250 Quadriplegia, unspecified: Secondary | ICD-10-CM | POA: Diagnosis not present

## 2016-07-22 DIAGNOSIS — K592 Neurogenic bowel, not elsewhere classified: Secondary | ICD-10-CM | POA: Diagnosis not present

## 2016-07-22 DIAGNOSIS — W19XXXD Unspecified fall, subsequent encounter: Secondary | ICD-10-CM | POA: Diagnosis not present

## 2016-07-22 DIAGNOSIS — M5000 Cervical disc disorder with myelopathy, unspecified cervical region: Secondary | ICD-10-CM | POA: Diagnosis not present

## 2016-07-22 DIAGNOSIS — E1142 Type 2 diabetes mellitus with diabetic polyneuropathy: Secondary | ICD-10-CM | POA: Diagnosis not present

## 2016-07-25 DIAGNOSIS — M48062 Spinal stenosis, lumbar region with neurogenic claudication: Secondary | ICD-10-CM | POA: Diagnosis not present

## 2016-07-25 DIAGNOSIS — I10 Essential (primary) hypertension: Secondary | ICD-10-CM | POA: Diagnosis not present

## 2016-07-25 DIAGNOSIS — E1142 Type 2 diabetes mellitus with diabetic polyneuropathy: Secondary | ICD-10-CM | POA: Diagnosis not present

## 2016-07-25 DIAGNOSIS — S14129D Central cord syndrome at unspecified level of cervical spinal cord, subsequent encounter: Secondary | ICD-10-CM | POA: Diagnosis not present

## 2016-07-25 DIAGNOSIS — I82443 Acute embolism and thrombosis of tibial vein, bilateral: Secondary | ICD-10-CM | POA: Diagnosis not present

## 2016-07-25 DIAGNOSIS — G8250 Quadriplegia, unspecified: Secondary | ICD-10-CM | POA: Diagnosis not present

## 2016-07-26 DIAGNOSIS — E1142 Type 2 diabetes mellitus with diabetic polyneuropathy: Secondary | ICD-10-CM | POA: Diagnosis not present

## 2016-07-26 DIAGNOSIS — I82443 Acute embolism and thrombosis of tibial vein, bilateral: Secondary | ICD-10-CM | POA: Diagnosis not present

## 2016-07-26 DIAGNOSIS — I10 Essential (primary) hypertension: Secondary | ICD-10-CM | POA: Diagnosis not present

## 2016-07-26 DIAGNOSIS — G8250 Quadriplegia, unspecified: Secondary | ICD-10-CM | POA: Diagnosis not present

## 2016-07-26 DIAGNOSIS — S14129D Central cord syndrome at unspecified level of cervical spinal cord, subsequent encounter: Secondary | ICD-10-CM | POA: Diagnosis not present

## 2016-07-26 DIAGNOSIS — M48062 Spinal stenosis, lumbar region with neurogenic claudication: Secondary | ICD-10-CM | POA: Diagnosis not present

## 2016-07-27 ENCOUNTER — Telehealth: Payer: Self-pay | Admitting: *Deleted

## 2016-07-27 NOTE — Telephone Encounter (Signed)
Physical Therapist has completed homehealth physical therapy evaluation and is asking for verbal orders 3week2 followed by 2week2 to address balance, strengthening, and gait training.  Verbal orders given

## 2016-07-29 DIAGNOSIS — I10 Essential (primary) hypertension: Secondary | ICD-10-CM | POA: Diagnosis not present

## 2016-07-29 DIAGNOSIS — M48062 Spinal stenosis, lumbar region with neurogenic claudication: Secondary | ICD-10-CM | POA: Diagnosis not present

## 2016-07-29 DIAGNOSIS — I82443 Acute embolism and thrombosis of tibial vein, bilateral: Secondary | ICD-10-CM | POA: Diagnosis not present

## 2016-07-29 DIAGNOSIS — G8250 Quadriplegia, unspecified: Secondary | ICD-10-CM | POA: Diagnosis not present

## 2016-07-29 DIAGNOSIS — E1142 Type 2 diabetes mellitus with diabetic polyneuropathy: Secondary | ICD-10-CM | POA: Diagnosis not present

## 2016-07-29 DIAGNOSIS — S14129D Central cord syndrome at unspecified level of cervical spinal cord, subsequent encounter: Secondary | ICD-10-CM | POA: Diagnosis not present

## 2016-08-01 ENCOUNTER — Encounter: Payer: Medicare Other | Attending: Physical Medicine & Rehabilitation | Admitting: Physical Medicine & Rehabilitation

## 2016-08-01 ENCOUNTER — Encounter: Payer: Self-pay | Admitting: Physical Medicine & Rehabilitation

## 2016-08-01 VITALS — BP 123/74 | HR 79 | Resp 14

## 2016-08-01 DIAGNOSIS — G4733 Obstructive sleep apnea (adult) (pediatric): Secondary | ICD-10-CM | POA: Insufficient documentation

## 2016-08-01 DIAGNOSIS — E1142 Type 2 diabetes mellitus with diabetic polyneuropathy: Secondary | ICD-10-CM | POA: Diagnosis not present

## 2016-08-01 DIAGNOSIS — F17290 Nicotine dependence, other tobacco product, uncomplicated: Secondary | ICD-10-CM | POA: Diagnosis not present

## 2016-08-01 DIAGNOSIS — Z8249 Family history of ischemic heart disease and other diseases of the circulatory system: Secondary | ICD-10-CM | POA: Diagnosis not present

## 2016-08-01 DIAGNOSIS — S14129S Central cord syndrome at unspecified level of cervical spinal cord, sequela: Secondary | ICD-10-CM | POA: Diagnosis not present

## 2016-08-01 DIAGNOSIS — I10 Essential (primary) hypertension: Secondary | ICD-10-CM | POA: Diagnosis not present

## 2016-08-01 DIAGNOSIS — G8250 Quadriplegia, unspecified: Secondary | ICD-10-CM | POA: Diagnosis not present

## 2016-08-01 DIAGNOSIS — M48062 Spinal stenosis, lumbar region with neurogenic claudication: Secondary | ICD-10-CM | POA: Diagnosis not present

## 2016-08-01 DIAGNOSIS — R2681 Unsteadiness on feet: Secondary | ICD-10-CM | POA: Insufficient documentation

## 2016-08-01 DIAGNOSIS — Z86718 Personal history of other venous thrombosis and embolism: Secondary | ICD-10-CM | POA: Diagnosis not present

## 2016-08-01 DIAGNOSIS — Z981 Arthrodesis status: Secondary | ICD-10-CM | POA: Insufficient documentation

## 2016-08-01 DIAGNOSIS — Z7901 Long term (current) use of anticoagulants: Secondary | ICD-10-CM | POA: Insufficient documentation

## 2016-08-01 DIAGNOSIS — Z8673 Personal history of transient ischemic attack (TIA), and cerebral infarction without residual deficits: Secondary | ICD-10-CM | POA: Insufficient documentation

## 2016-08-01 DIAGNOSIS — I82443 Acute embolism and thrombosis of tibial vein, bilateral: Secondary | ICD-10-CM | POA: Diagnosis not present

## 2016-08-01 DIAGNOSIS — E669 Obesity, unspecified: Secondary | ICD-10-CM | POA: Insufficient documentation

## 2016-08-01 DIAGNOSIS — I824Z3 Acute embolism and thrombosis of unspecified deep veins of distal lower extremity, bilateral: Secondary | ICD-10-CM | POA: Diagnosis not present

## 2016-08-01 DIAGNOSIS — S14129D Central cord syndrome at unspecified level of cervical spinal cord, subsequent encounter: Secondary | ICD-10-CM | POA: Diagnosis not present

## 2016-08-01 DIAGNOSIS — X58XXXS Exposure to other specified factors, sequela: Secondary | ICD-10-CM | POA: Insufficient documentation

## 2016-08-01 DIAGNOSIS — M48061 Spinal stenosis, lumbar region without neurogenic claudication: Secondary | ICD-10-CM | POA: Insufficient documentation

## 2016-08-01 DIAGNOSIS — K592 Neurogenic bowel, not elsewhere classified: Secondary | ICD-10-CM | POA: Diagnosis not present

## 2016-08-01 DIAGNOSIS — M1990 Unspecified osteoarthritis, unspecified site: Secondary | ICD-10-CM | POA: Insufficient documentation

## 2016-08-01 DIAGNOSIS — E785 Hyperlipidemia, unspecified: Secondary | ICD-10-CM | POA: Diagnosis not present

## 2016-08-01 MED ORDER — OXYCODONE HCL 10 MG PO TABS: 10.0000 mg | ORAL_TABLET | ORAL | 0 refills | Status: DC | PRN

## 2016-08-01 MED ORDER — METHOCARBAMOL 500 MG PO TABS: 500.0000 mg | ORAL_TABLET | Freq: Four times a day (QID) | ORAL | 2 refills | Status: DC | PRN

## 2016-08-01 MED ORDER — BLOOD GLUCOSE MONITOR KIT: PACK | 0 refills | Status: AC

## 2016-08-01 NOTE — Patient Instructions (Signed)
PLEASE FEEL FREE TO CALL OUR OFFICE WITH ANY PROBLEMS OR QUESTIONS ([phone])      

## 2016-08-01 NOTE — Progress Notes (Addendum)
Subjective:    Patient ID: Rodney Crane, male    DOB: March 16, 1951, 66 y.o.   MRN: [id number]  HPI  This is a transitional care visit for Mr. Rodney Crane. Mr Comp is here in follow up of her cervical SCI.  He is continuing to progress from a motor and sensory standpoint. He's moving independently with awalker. He can bathe and dress. He's able to write now. HH PT is walking him out of the house on pavement and uneven ground as well.   Pain is stable to improved. He uses his oxycodone when he needs it. He tends to use it more when he's active. He is taking robaxin for spasms. He wears his c-collar religiously.   His leg swelling has improved for the most part. He remains on xarelto 20mg  daily. His xarelto has been on hold since his operation and initiation of the xareltoHe is taking miralax daily and is bowel continent consistently. His bladder seems to be working well. He is using the regular toilet seat now, in fact.   Sleep is good. His sugars are under better control although his glucometer has just died. He asked me about his bp medication and wondered if his diovan and hctz could be combined into one medication.     Pain Inventory Average Pain 5 Pain Right Now 3 My pain is intermittent  In the last 24 hours, has pain interfered with the following? General activity 5 Relation with others 5 Enjoyment of life 5 What TIME of day is your pain at its worst? evening Sleep (in general) Fair  Pain is worse with: some activites Pain improves with: medication Relief from Meds: 5  Mobility walk with assistance use a walker how many minutes can you walk? 10 ability to climb steps?  no do you drive?  no needs help with transfers Do you have any goals in this area?  yes  Function retired I need assistance with the following:  dressing, bathing and meal prep  Neuro/Psych numbness tingling trouble walking  Prior Studies Any changes since last visit?  no  Physicians  involved in your care Any changes since last visit?  no   Family History  Problem Relation Age of Onset  . Multiple sclerosis Sister   . Dementia Mother   . Cancer Father   . Sleep apnea Brother   . Hypertension Brother   . Colon cancer Neg Hx   . Stomach cancer Neg Hx    Social History   Social History  . Marital status: Married    Spouse name: Nicki Reaper  . Number of children: 2  . Years of education: 14   Occupational History  .      retired   Social History Main Topics  . Smoking status: Current Some Day Smoker    Types: Cigarettes    Last attempt to quit: 01/21/2011  . Smokeless tobacco: Never Used     Comment: 2-3 times weekly  . Alcohol use No  . Drug use: No  . Sexual activity: Not Asked   Other Topics Concern  . None   Social History Narrative   Patient is right handed and resides with wife   Past Surgical History:  Procedure Laterality Date  . CARPAL TUNNEL RELEASE     bilateral  . CERVICAL DISCECTOMY  2004  . LUMBAR DISC SURGERY     x6  . POSTERIOR CERVICAL FUSION/FORAMINOTOMY N/A 06/27/2016   Procedure: POSTERIOR CERVICAL FUSION C2-C5; C2-C4 Decompression;  Surgeon: Duane Lope  Rolena Infante, MD;  Location: [facility];  Service: Orthopedics;  Laterality: N/A;   Past Medical History:  Diagnosis Date  . Arthritis   . Colon polyps   . CVA (cerebral infarction) 2010  . Diabetes mellitus   . Dizziness   . ED (erectile dysfunction)   . Foot drop   . H/O Bell's palsy   . Hyperlipidemia   . Hypertension   . Obesity   . OSA on CPAP   . Spinal stenosis   . Stroke ([place])   . Unsteady gait    BP 123/74 (BP Location: Right Arm, Patient Position: Sitting, Cuff Size: Large)   Pulse 79   Resp 14   SpO2 98%   Opioid Risk Score:   Fall Risk Score:  `1  Depression screen PHQ 2/9  No flowsheet data found.  Review of Systems  Constitutional: Negative.   HENT: Negative.   Eyes: Negative.   Respiratory: Positive for apnea.   Cardiovascular: Negative.     Gastrointestinal: Negative.   Endocrine: Negative.   Genitourinary: Negative.   Musculoskeletal: Positive for gait problem.  Skin: Negative.   Allergic/Immunologic: Negative.   Neurological: Positive for numbness.       Tingling   Hematological: Negative.   Psychiatric/Behavioral: Negative.   All other systems reviewed and are negative.      Objective:   Physical Exam  Constitutional: He is oriented to person, place, and time. He appears well-developed.  HENT:  Head: Normocephalic and atraumatic.  Eyes: EOMI.  Neck:  Minimal drainage from inferior portion of wound--rest of wound well approximated Cardiovascular: RRR Respiratory: CTA B GI: NT/ND Musculoskeletal: He exhibits tr to no edema in  ankles/feet Neurological: He is alert and oriented to person, place, and time.  Motor: RUE: Shoulder abduction 4/5, elbow flexion/extension 4/5, wrist/hand  4+ to 5/5 LUE: Shoulder abduction 4/5, elbow flexion/extension 4/5, wrist/hand 4+ to 5/5 RLE: HF, KE 4/5, ADF/PF   4/5 LLE: HF, KE 4/5, ADF/PF 4/5 Sensation diminished to light touch in hands and feet--but better with proprioception DTRs symmetric  Ambulates with slight ER of right leg to help clear foot. Tends to land with foot more flat than on right heel when foot strikes. Skin:  intact Psychiatric: pleasant and cooperative.       Assessment & Plan:  Medical Problem List and Plan: 1. Decreased functional mobility secondary to central cord syndrome status post posterior cervical fusion C2-C5, C2-C4 decompression 06/26/2016 as well as lumbar stenosis with planned surgical intervention             -continue HEP  -made a referral to [facility] for PT and OT 2. DVT Prophylaxis/Anticoagulation: Dopplers with persistent DVT in right and left posterior tibial veins. Other clots have resolved             -continue xarelto and re-check dopplers in 3 weeks in [place]--- '20mg'$  daily             -swelling largely  improved 3. Pain Management: Robaxin and oxycodone as needed--refilled both today  -don't expect him to need these long term -collar for support.  4. Mood: Provide emotional support 5. Neuropsych: This patient is capable of making decisions on his own behalf. 6. Skin/Wound Care: inferior aspect of wound healing---minimal drainage at this point  -can transition to bandaid 9. OSA. CPAP 10. Hypertension. HCTZ 12.5 mg daily, Toprol-XL 50 mg daily, Avapro 300 mg daily.    -can convert to combo drug if he chooses. bp under good  control at present 11. Diabetes mellitus with peripheral neuropathy.   -continue glucophage             -sugars stable at home.   -wrote order for home glucometer and kit                 12. History of CVA. Continue to hold plavix until xarelto stopped 13. Hyperlipidemia. Lipitor 14. Neurogenic bowel:  continent.              -miralax daily may continue 15. Tobacco abuse: smoking a cigar "every now and then"  Thirty minutes of face to face patient care time were spent during this visit. All questions were encouraged and answered. Follow up with me in about 6 weeks.

## 2016-08-01 NOTE — Addendum Note (Signed)
Addended by: Alger Simons T on: 08/01/2016 04:17 PM   Modules accepted: Orders

## 2016-08-02 DIAGNOSIS — E1142 Type 2 diabetes mellitus with diabetic polyneuropathy: Secondary | ICD-10-CM | POA: Diagnosis not present

## 2016-08-02 DIAGNOSIS — Z6836 Body mass index (BMI) 36.0-36.9, adult: Secondary | ICD-10-CM | POA: Diagnosis not present

## 2016-08-02 DIAGNOSIS — I10 Essential (primary) hypertension: Secondary | ICD-10-CM | POA: Diagnosis not present

## 2016-08-02 DIAGNOSIS — I82443 Acute embolism and thrombosis of tibial vein, bilateral: Secondary | ICD-10-CM | POA: Diagnosis not present

## 2016-08-02 DIAGNOSIS — E1129 Type 2 diabetes mellitus with other diabetic kidney complication: Secondary | ICD-10-CM | POA: Diagnosis not present

## 2016-08-02 DIAGNOSIS — Z981 Arthrodesis status: Secondary | ICD-10-CM | POA: Diagnosis not present

## 2016-08-02 DIAGNOSIS — S14129D Central cord syndrome at unspecified level of cervical spinal cord, subsequent encounter: Secondary | ICD-10-CM | POA: Diagnosis not present

## 2016-08-02 DIAGNOSIS — Z86718 Personal history of other venous thrombosis and embolism: Secondary | ICD-10-CM | POA: Insufficient documentation

## 2016-08-02 DIAGNOSIS — I82409 Acute embolism and thrombosis of unspecified deep veins of unspecified lower extremity: Secondary | ICD-10-CM | POA: Diagnosis not present

## 2016-08-02 DIAGNOSIS — G8250 Quadriplegia, unspecified: Secondary | ICD-10-CM | POA: Diagnosis not present

## 2016-08-02 DIAGNOSIS — M48062 Spinal stenosis, lumbar region with neurogenic claudication: Secondary | ICD-10-CM | POA: Diagnosis not present

## 2016-08-02 DIAGNOSIS — M4712 Other spondylosis with myelopathy, cervical region: Secondary | ICD-10-CM | POA: Insufficient documentation

## 2016-08-02 DIAGNOSIS — Z7901 Long term (current) use of anticoagulants: Secondary | ICD-10-CM | POA: Diagnosis not present

## 2016-08-03 DIAGNOSIS — S14129D Central cord syndrome at unspecified level of cervical spinal cord, subsequent encounter: Secondary | ICD-10-CM | POA: Diagnosis not present

## 2016-08-03 DIAGNOSIS — M48062 Spinal stenosis, lumbar region with neurogenic claudication: Secondary | ICD-10-CM | POA: Diagnosis not present

## 2016-08-03 DIAGNOSIS — E1142 Type 2 diabetes mellitus with diabetic polyneuropathy: Secondary | ICD-10-CM | POA: Diagnosis not present

## 2016-08-03 DIAGNOSIS — G8250 Quadriplegia, unspecified: Secondary | ICD-10-CM | POA: Diagnosis not present

## 2016-08-03 DIAGNOSIS — I82443 Acute embolism and thrombosis of tibial vein, bilateral: Secondary | ICD-10-CM | POA: Diagnosis not present

## 2016-08-03 DIAGNOSIS — I10 Essential (primary) hypertension: Secondary | ICD-10-CM | POA: Diagnosis not present

## 2016-08-04 ENCOUNTER — Telehealth: Payer: Self-pay | Admitting: *Deleted

## 2016-08-04 NOTE — Telephone Encounter (Signed)
Marlowe Kays, OT, [email or web address]  left a message asking for verbal orders 2week4.  Contacted OT and gave verbal orders per office protocol

## 2016-08-05 DIAGNOSIS — G8250 Quadriplegia, unspecified: Secondary | ICD-10-CM | POA: Diagnosis not present

## 2016-08-05 DIAGNOSIS — I82443 Acute embolism and thrombosis of tibial vein, bilateral: Secondary | ICD-10-CM | POA: Diagnosis not present

## 2016-08-05 DIAGNOSIS — M48062 Spinal stenosis, lumbar region with neurogenic claudication: Secondary | ICD-10-CM | POA: Diagnosis not present

## 2016-08-05 DIAGNOSIS — I10 Essential (primary) hypertension: Secondary | ICD-10-CM | POA: Diagnosis not present

## 2016-08-05 DIAGNOSIS — S14129D Central cord syndrome at unspecified level of cervical spinal cord, subsequent encounter: Secondary | ICD-10-CM | POA: Diagnosis not present

## 2016-08-05 DIAGNOSIS — E1142 Type 2 diabetes mellitus with diabetic polyneuropathy: Secondary | ICD-10-CM | POA: Diagnosis not present

## 2016-08-08 ENCOUNTER — Telehealth: Payer: Self-pay | Admitting: *Deleted

## 2016-08-08 DIAGNOSIS — S14129D Central cord syndrome at unspecified level of cervical spinal cord, subsequent encounter: Secondary | ICD-10-CM | POA: Diagnosis not present

## 2016-08-08 DIAGNOSIS — E1142 Type 2 diabetes mellitus with diabetic polyneuropathy: Secondary | ICD-10-CM | POA: Diagnosis not present

## 2016-08-08 DIAGNOSIS — G8250 Quadriplegia, unspecified: Secondary | ICD-10-CM | POA: Diagnosis not present

## 2016-08-08 DIAGNOSIS — I82443 Acute embolism and thrombosis of tibial vein, bilateral: Secondary | ICD-10-CM | POA: Diagnosis not present

## 2016-08-08 DIAGNOSIS — M48062 Spinal stenosis, lumbar region with neurogenic claudication: Secondary | ICD-10-CM | POA: Diagnosis not present

## 2016-08-08 DIAGNOSIS — I10 Essential (primary) hypertension: Secondary | ICD-10-CM | POA: Diagnosis not present

## 2016-08-08 NOTE — Telephone Encounter (Signed)
[facility] called to get verbal order to see Rodney Crane this week 3xwk1 and then he should got to outpt PT next week.  Approval given

## 2016-08-10 ENCOUNTER — Telehealth: Payer: Self-pay | Admitting: Physical Medicine & Rehabilitation

## 2016-08-10 DIAGNOSIS — E1142 Type 2 diabetes mellitus with diabetic polyneuropathy: Secondary | ICD-10-CM | POA: Diagnosis not present

## 2016-08-10 DIAGNOSIS — M48062 Spinal stenosis, lumbar region with neurogenic claudication: Secondary | ICD-10-CM | POA: Diagnosis not present

## 2016-08-10 DIAGNOSIS — G8250 Quadriplegia, unspecified: Secondary | ICD-10-CM | POA: Diagnosis not present

## 2016-08-10 DIAGNOSIS — I82443 Acute embolism and thrombosis of tibial vein, bilateral: Secondary | ICD-10-CM | POA: Diagnosis not present

## 2016-08-10 DIAGNOSIS — S14129D Central cord syndrome at unspecified level of cervical spinal cord, subsequent encounter: Secondary | ICD-10-CM | POA: Diagnosis not present

## 2016-08-10 DIAGNOSIS — I10 Essential (primary) hypertension: Secondary | ICD-10-CM | POA: Diagnosis not present

## 2016-08-10 NOTE — Telephone Encounter (Signed)
Erin PT with [facility] needs to get verbal orders for 3w1 this week and discharge at end of this week so patient can start outpatient therapy next week.  Please call her at [phone].

## 2016-08-10 NOTE — Telephone Encounter (Signed)
Returned call for verbal orders and notate in patients chart

## 2016-08-12 DIAGNOSIS — E1142 Type 2 diabetes mellitus with diabetic polyneuropathy: Secondary | ICD-10-CM | POA: Diagnosis not present

## 2016-08-12 DIAGNOSIS — I10 Essential (primary) hypertension: Secondary | ICD-10-CM | POA: Diagnosis not present

## 2016-08-12 DIAGNOSIS — S14129D Central cord syndrome at unspecified level of cervical spinal cord, subsequent encounter: Secondary | ICD-10-CM | POA: Diagnosis not present

## 2016-08-12 DIAGNOSIS — M48062 Spinal stenosis, lumbar region with neurogenic claudication: Secondary | ICD-10-CM | POA: Diagnosis not present

## 2016-08-12 DIAGNOSIS — G8250 Quadriplegia, unspecified: Secondary | ICD-10-CM | POA: Diagnosis not present

## 2016-08-12 DIAGNOSIS — I82443 Acute embolism and thrombosis of tibial vein, bilateral: Secondary | ICD-10-CM | POA: Diagnosis not present

## 2016-08-15 ENCOUNTER — Telehealth: Payer: Self-pay

## 2016-08-15 NOTE — Telephone Encounter (Signed)
Rodney Crane PT with [facility] has requested a verbal order to continue Home PT twice a week for two weeks. Verbal Order has been given. Rodney Crane can be reached at [phone] if there are any questions or concerns.

## 2016-08-17 DIAGNOSIS — S14129D Central cord syndrome at unspecified level of cervical spinal cord, subsequent encounter: Secondary | ICD-10-CM | POA: Diagnosis not present

## 2016-08-17 DIAGNOSIS — M48062 Spinal stenosis, lumbar region with neurogenic claudication: Secondary | ICD-10-CM | POA: Diagnosis not present

## 2016-08-17 DIAGNOSIS — E1142 Type 2 diabetes mellitus with diabetic polyneuropathy: Secondary | ICD-10-CM | POA: Diagnosis not present

## 2016-08-17 DIAGNOSIS — I82443 Acute embolism and thrombosis of tibial vein, bilateral: Secondary | ICD-10-CM | POA: Diagnosis not present

## 2016-08-17 DIAGNOSIS — G8250 Quadriplegia, unspecified: Secondary | ICD-10-CM | POA: Diagnosis not present

## 2016-08-17 DIAGNOSIS — I10 Essential (primary) hypertension: Secondary | ICD-10-CM | POA: Diagnosis not present

## 2016-08-19 DIAGNOSIS — M48062 Spinal stenosis, lumbar region with neurogenic claudication: Secondary | ICD-10-CM | POA: Diagnosis not present

## 2016-08-19 DIAGNOSIS — I82443 Acute embolism and thrombosis of tibial vein, bilateral: Secondary | ICD-10-CM | POA: Diagnosis not present

## 2016-08-19 DIAGNOSIS — G8250 Quadriplegia, unspecified: Secondary | ICD-10-CM | POA: Diagnosis not present

## 2016-08-19 DIAGNOSIS — I10 Essential (primary) hypertension: Secondary | ICD-10-CM | POA: Diagnosis not present

## 2016-08-19 DIAGNOSIS — E1142 Type 2 diabetes mellitus with diabetic polyneuropathy: Secondary | ICD-10-CM | POA: Diagnosis not present

## 2016-08-19 DIAGNOSIS — S14129D Central cord syndrome at unspecified level of cervical spinal cord, subsequent encounter: Secondary | ICD-10-CM | POA: Diagnosis not present

## 2016-08-22 DIAGNOSIS — M48062 Spinal stenosis, lumbar region with neurogenic claudication: Secondary | ICD-10-CM | POA: Diagnosis not present

## 2016-08-22 DIAGNOSIS — G8250 Quadriplegia, unspecified: Secondary | ICD-10-CM | POA: Diagnosis not present

## 2016-08-22 DIAGNOSIS — I82443 Acute embolism and thrombosis of tibial vein, bilateral: Secondary | ICD-10-CM | POA: Diagnosis not present

## 2016-08-22 DIAGNOSIS — S14129D Central cord syndrome at unspecified level of cervical spinal cord, subsequent encounter: Secondary | ICD-10-CM | POA: Diagnosis not present

## 2016-08-22 DIAGNOSIS — Z9889 Other specified postprocedural states: Secondary | ICD-10-CM | POA: Diagnosis not present

## 2016-08-22 DIAGNOSIS — E1142 Type 2 diabetes mellitus with diabetic polyneuropathy: Secondary | ICD-10-CM | POA: Diagnosis not present

## 2016-08-22 DIAGNOSIS — I10 Essential (primary) hypertension: Secondary | ICD-10-CM | POA: Diagnosis not present

## 2016-08-22 DIAGNOSIS — Z4789 Encounter for other orthopedic aftercare: Secondary | ICD-10-CM | POA: Diagnosis not present

## 2016-08-24 DIAGNOSIS — I10 Essential (primary) hypertension: Secondary | ICD-10-CM | POA: Diagnosis not present

## 2016-08-24 DIAGNOSIS — E1142 Type 2 diabetes mellitus with diabetic polyneuropathy: Secondary | ICD-10-CM | POA: Diagnosis not present

## 2016-08-24 DIAGNOSIS — S14129D Central cord syndrome at unspecified level of cervical spinal cord, subsequent encounter: Secondary | ICD-10-CM | POA: Diagnosis not present

## 2016-08-24 DIAGNOSIS — G8250 Quadriplegia, unspecified: Secondary | ICD-10-CM | POA: Diagnosis not present

## 2016-08-24 DIAGNOSIS — I82443 Acute embolism and thrombosis of tibial vein, bilateral: Secondary | ICD-10-CM | POA: Diagnosis not present

## 2016-08-24 DIAGNOSIS — M48062 Spinal stenosis, lumbar region with neurogenic claudication: Secondary | ICD-10-CM | POA: Diagnosis not present

## 2016-08-28 ENCOUNTER — Emergency Department (HOSPITAL_COMMUNITY)
Admission: EM | Admit: 2016-08-28 | Discharge: 2016-08-28 | Disposition: A | Discharge status: Home / Self Care | Payer: Medicare Other | Attending: Emergency Medicine | Admitting: Emergency Medicine

## 2016-08-28 ENCOUNTER — Emergency Department (HOSPITAL_BASED_OUTPATIENT_CLINIC_OR_DEPARTMENT_OTHER): Admit: 2016-08-28 | Discharge: 2016-08-28 | Disposition: A | Discharge status: Home / Self Care | Payer: Medicare Other

## 2016-08-28 ENCOUNTER — Emergency Department (HOSPITAL_COMMUNITY): Payer: Medicare Other

## 2016-08-28 ENCOUNTER — Encounter (HOSPITAL_COMMUNITY): Payer: Self-pay | Admitting: Emergency Medicine

## 2016-08-28 DIAGNOSIS — M5431 Sciatica, right side: Secondary | ICD-10-CM | POA: Diagnosis not present

## 2016-08-28 DIAGNOSIS — Z7984 Long term (current) use of oral hypoglycemic drugs: Secondary | ICD-10-CM | POA: Diagnosis not present

## 2016-08-28 DIAGNOSIS — M5441 Lumbago with sciatica, right side: Secondary | ICD-10-CM | POA: Diagnosis not present

## 2016-08-28 DIAGNOSIS — M79651 Pain in right thigh: Secondary | ICD-10-CM

## 2016-08-28 DIAGNOSIS — E1140 Type 2 diabetes mellitus with diabetic neuropathy, unspecified: Secondary | ICD-10-CM | POA: Diagnosis not present

## 2016-08-28 DIAGNOSIS — Z79899 Other long term (current) drug therapy: Secondary | ICD-10-CM | POA: Insufficient documentation

## 2016-08-28 DIAGNOSIS — Z8673 Personal history of transient ischemic attack (TIA), and cerebral infarction without residual deficits: Secondary | ICD-10-CM | POA: Insufficient documentation

## 2016-08-28 DIAGNOSIS — I10 Essential (primary) hypertension: Secondary | ICD-10-CM | POA: Insufficient documentation

## 2016-08-28 DIAGNOSIS — R609 Edema, unspecified: Secondary | ICD-10-CM

## 2016-08-28 DIAGNOSIS — F17210 Nicotine dependence, cigarettes, uncomplicated: Secondary | ICD-10-CM | POA: Insufficient documentation

## 2016-08-28 DIAGNOSIS — M6281 Muscle weakness (generalized): Secondary | ICD-10-CM | POA: Diagnosis not present

## 2016-08-28 DIAGNOSIS — E119 Type 2 diabetes mellitus without complications: Secondary | ICD-10-CM | POA: Insufficient documentation

## 2016-08-28 DIAGNOSIS — R29898 Other symptoms and signs involving the musculoskeletal system: Secondary | ICD-10-CM

## 2016-08-28 DIAGNOSIS — Z7901 Long term (current) use of anticoagulants: Secondary | ICD-10-CM | POA: Insufficient documentation

## 2016-08-28 DIAGNOSIS — M25551 Pain in right hip: Secondary | ICD-10-CM | POA: Diagnosis present

## 2016-08-28 HISTORY — DX: Acute embolism and thrombosis of unspecified deep veins of unspecified lower extremity: I82.409

## 2016-08-28 LAB — COMPREHENSIVE METABOLIC PANEL
ALT: 22 U/L (ref 17–63)
ANION GAP: 9 (ref 5–15)
AST: 24 U/L (ref 15–41)
Albumin: 3.5 g/dL (ref 3.5–5.0)
Alkaline Phosphatase: 100 U/L (ref 38–126)
BUN: 10 mg/dL (ref 6–20)
CHLORIDE: 101 mmol/L (ref 101–111)
CO2: 23 mmol/L (ref 22–32)
Calcium: 9.4 mg/dL (ref 8.9–10.3)
Creatinine, Ser: 1.16 mg/dL (ref 0.61–1.24)
GFR calc Af Amer: >60 mL/min (ref >60)
GFR calc non Af Amer: >60 mL/min (ref >60)
GLUCOSE: 224 mg/dL — AB (ref 65–99)
POTASSIUM: 4.1 mmol/L (ref 3.5–5.1)
SODIUM: 133 mmol/L — AB (ref 135–145)
TOTAL PROTEIN: 7.3 g/dL (ref 6.5–8.1)
Total Bilirubin: 0.4 mg/dL (ref 0.3–1.2)

## 2016-08-28 LAB — CBC WITH DIFFERENTIAL/PLATELET
BASOS ABS: 0 10*3/uL (ref 0.0–0.1)
Basophils Relative: 0 %
EOS PCT: 2 %
Eosinophils Absolute: 0.1 10*3/uL (ref 0.0–0.7)
HEMATOCRIT: 33.2 % — AB (ref 39.0–52.0)
Hemoglobin: 11.1 g/dL — ABNORMAL LOW (ref 13.0–17.0)
LYMPHS ABS: 1.7 10*3/uL (ref 0.7–4.0)
LYMPHS PCT: 26 %
MCH: 29.4 pg (ref 26.0–34.0)
MCHC: 33.4 g/dL (ref 30.0–36.0)
MCV: 88.1 fL (ref 78.0–100.0)
MONO ABS: 0.5 10*3/uL (ref 0.1–1.0)
Monocytes Relative: 7 %
Neutro Abs: 4.3 10*3/uL (ref 1.7–7.7)
Neutrophils Relative %: 65 %
PLATELETS: 310 10*3/uL (ref 150–400)
RBC: 3.77 MIL/uL — ABNORMAL LOW (ref 4.22–5.81)
RDW: 13.8 % (ref 11.5–15.5)
WBC: 6.6 10*3/uL (ref 4.0–10.5)

## 2016-08-28 LAB — I-STAT CG4 LACTIC ACID, ED: Lactic Acid, Venous: 1.54 mmol/L (ref 0.5–1.9)

## 2016-08-28 MED ORDER — CYCLOBENZAPRINE HCL 10 MG PO TABS: 10.0000 mg | ORAL_TABLET | Freq: Two times a day (BID) | ORAL | 0 refills | Status: DC | PRN

## 2016-08-28 MED ORDER — DIAZEPAM 5 MG PO TABS
5.0000 mg | ORAL_TABLET | Freq: Once | ORAL | Status: AC
  Administered 2016-08-28: 5 mg via ORAL
  Filled 2016-08-28: qty 1

## 2016-08-28 MED ORDER — FENTANYL CITRATE (PF) 100 MCG/2ML IJ SOLN
100.0000 ug | Freq: Once | INTRAMUSCULAR | Status: AC
  Administered 2016-08-28: 100 ug via INTRAVENOUS
  Filled 2016-08-28: qty 2

## 2016-08-28 MED ORDER — HYDROMORPHONE HCL 1 MG/ML IJ SOLN
1.0000 mg | Freq: Once | INTRAMUSCULAR | Status: AC
  Administered 2016-08-28: 1 mg via INTRAMUSCULAR
  Filled 2016-08-28: qty 1

## 2016-08-28 MED ORDER — HYDROCODONE-ACETAMINOPHEN 5-325 MG PO TABS: 2.0000 | ORAL_TABLET | ORAL | 0 refills | Status: DC | PRN

## 2016-08-28 NOTE — ED Notes (Signed)
MRI called and states pt not tolerating laying flat for MRI. EDP made aware, going down to MRI to speak with patient.

## 2016-08-28 NOTE — ED Notes (Signed)
Patient transported to MRI 

## 2016-08-28 NOTE — ED Triage Notes (Signed)
Pt c/o leg swelling, right hip pain-- had surgery on neck-- march 3rd by Dr. Rolena Infante-- went home March 30th -- had blood clots in both legs-- has been on xeralto since. Hx of drop foot on right side, wears a brace. Hip hurts constantly -- leg is swelling more daily.

## 2016-08-28 NOTE — Progress Notes (Addendum)
VASCULAR LAB PRELIMINARY  PRELIMINARY  PRELIMINARY  PRELIMINARY  Bilateral lower extremity venous duplex completed.    Preliminary report:  There is no new DVT or SVT noted in the bilateral lower extremities.  The previous DVT found in March appear to have resolved.  Gave report to Dr. Lesia Sago, [facility], RVT 08/28/2016, 4:25 PM

## 2016-08-28 NOTE — Discharge Instructions (Signed)
You no longer have a blood clot in your right leg.  Discuss medicine management with your doctor as you had bilateral leg clots in your chart.  If you were given medicines take as directed.  If you are on coumadin or contraceptives realize their levels and effectiveness is altered by many different medicines.  If you have any reaction (rash, tongues swelling, other) to the medicines stop taking and see a physician.    If your blood pressure was elevated in the ER make sure you follow up for management with a primary doctor or return for chest pain, shortness of breath or stroke symptoms.  Please follow up as directed and return to the ER or see a physician for new or worsening symptoms.  Thank you. Vitals:   08/28/16 1258 08/28/16 1416 08/28/16 1419 08/28/16 1545  BP:  (!) 170/99  (!) 182/89  Pulse:   79 65  Resp:   20 (!) 21  Temp:      TempSrc:      SpO2:   100% 100%  Weight: 270 lb (122.5 kg)     Height: 6\' 4"  (1.93 m)

## 2016-08-28 NOTE — ED Provider Notes (Signed)
[facility] DEPT Provider Note   CSN: [id number] Arrival date & time: 08/28/16  1250     History   Chief Complaint Chief Complaint  Patient presents with  . Hip Pain  . Leg Swelling    HPI Rodney Crane is a 66 y.o. male.  HPI Patient presents to the emergency department with hip pain and leg swelling.  The patient states that legs been swelling.  He does have blood clots that he developed after his neck surgery at the end of March.  Patient is on Xarelto the patient is a chronic foot drop on the right.  He should states that his pain in the hip hurts worse with movement and palpation. The patient denies chest pain, shortness of breath, headache,blurred vision, neck pain, fever, cough, weakness, numbness, dizziness, anorexia, edema, abdominal pain, nausea, vomiting, diarrhea, rash, back pain, dysuria, hematemesis, bloody stool, near syncope, or syncope.  Patient states he does have some back discomfort but mainly at the hip and lateral thigh Past Medical History:  Diagnosis Date  . Arthritis   . Colon polyps   . CVA (cerebral infarction) 2010  . Diabetes mellitus   . Dizziness   . DVT (deep venous thrombosis) (HCC)    bilateral  . ED (erectile dysfunction)   . Foot drop   . H/O Bell's palsy   . Hyperlipidemia   . Hypertension   . Obesity   . OSA on CPAP   . Spinal stenosis   . Stroke ([place])   . Unsteady gait     Patient Active Problem List   Diagnosis Date Noted  . Acute deep vein thrombosis (DVT) of distal vein of both lower extremities ([place]) 07/07/2016  . Abdominal distention   . Abdominal pain   . S/P cervical spinal fusion   . Type 2 diabetes mellitus with peripheral neuropathy (HCC)   . Hyperlipidemia   . Neurogenic bowel   . Myelopathy ([place])   . OSA on CPAP   . Surgery, elective   . History of CVA (cerebrovascular accident)   . Benign essential HTN   . History of cervical spinal surgery   . History of lumbar surgery   . Tobacco abuse   . Diastolic  dysfunction   . Acute blood loss anemia   . Post-operative pain   . Tachycardia   . Leukocytosis   . Spinal stenosis of lumbar region   . Central cord syndrome ([place])   . Acute hypoxemic respiratory failure ([place])   . Spinal stenosis in cervical region 06/27/2016  . Cervical stenosis of spinal canal 06/25/2016  . Weakness of extremity 06/25/2016  . OSA (obstructive sleep apnea)   . Hypertension   . Diabetes ([place])   . DYSPNEA ON EXERTION 10/06/2009  . Nonspecific (abnormal) findings on radiological and other examination of body structure 10/06/2009  . CT, CHEST, ABNORMAL 10/06/2009  . COLONIC POLYPS 10/05/2009  . DIABETES, TYPE 2 10/05/2009  . HYPERLIPIDEMIA 10/05/2009  . 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 Essential hypertension 10/05/2009  . CVA 10/05/2009  . 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 CARPAL TUNNEL RELEASE     bilateral  . CERVICAL DISCECTOMY  2004  . LUMBAR DISC SURGERY     x6  . POSTERIOR CERVICAL FUSION/FORAMINOTOMY N/A 06/27/2016   Procedure: POSTERIOR CERVICAL FUSION C2-C5; C2-C4 Decompression;  Surgeon: Melina Schools, MD;  Location: [facility];  Service: Orthopedics;  Laterality: N/A;       Home  Medications    Prior to Admission medications   Medication Sig Start Date End Date Taking? Authorizing Provider  atorvastatin (LIPITOR) 40 MG tablet Take 0.5 tablets (20 mg total) by mouth at bedtime. 07/20/16   Angiulli, Lavon Paganini, PA-C  blood glucose meter kit and supplies KIT Dispense based on patient and insurance preference. Use up to four times daily as directed. (FOR ICD-9 250.00, 250.01). 08/01/16   Meredith Staggers, MD  furosemide (LASIX) 20 MG tablet Take 1 tablet (20 mg total) by mouth daily. 07/20/16   Angiulli, Lavon Paganini, PA-C  hydrochlorothiazide (MICROZIDE) 12.5 MG capsule Take 1 capsule (12.5 mg total) by mouth daily. 07/20/16   Angiulli, Lavon Paganini, PA-C  metFORMIN (GLUCOPHAGE) 500 MG tablet Take 1 tablet (500  mg total) by mouth 2 (two) times daily with a meal. 07/20/16   Angiulli, Lavon Paganini, PA-C  methocarbamol (ROBAXIN) 500 MG tablet Take 1 tablet (500 mg total) by mouth every 6 (six) hours as needed for muscle spasms. 08/01/16   Meredith Staggers, MD  metoprolol succinate (TOPROL XL) 50 MG 24 hr tablet Take 1 tablet (50 mg total) by mouth daily. 07/20/16   Angiulli, Lavon Paganini, PA-C  Oxycodone HCl 10 MG TABS Take 1 tablet (10 mg total) by mouth every 4 (four) hours as needed. 08/01/16   Meredith Staggers, MD  pantoprazole (PROTONIX) 40 MG tablet Take 1 tablet (40 mg total) by mouth daily at 6 PM. 07/20/16   Angiulli, Lavon Paganini, PA-C  polyethylene glycol (MIRALAX / GLYCOLAX) packet Take 17 g by mouth 2 (two) times daily. 07/04/16   Mariel Aloe, MD  potassium chloride SA (K-DUR,KLOR-CON) 20 MEQ tablet Take 1 tablet (20 mEq total) by mouth daily. 07/20/16   Angiulli, Lavon Paganini, PA-C  rivaroxaban (XARELTO) 20 MG TABS tablet Take 1 tablet (20 mg total) by mouth daily with supper. 07/20/16   Angiulli, Lavon Paganini, PA-C  senna-docusate (SENOKOT-S) 8.6-50 MG tablet Take 1 tablet by mouth at bedtime. 07/04/16   Mariel Aloe, MD  valsartan (DIOVAN) 320 MG tablet Take 0.5 tablets (160 mg total) by mouth daily. 07/20/16   Angiulli, Lavon Paganini, PA-C    Family History Family History  Problem Relation Age of Onset  . Multiple sclerosis Sister   . Dementia Mother   . Cancer Father   . Sleep apnea Brother   . Hypertension Brother   . Colon cancer Neg Hx   . Stomach cancer Neg Hx     Social History Social History  Substance Use Topics  . Smoking status: Current Some Day Smoker    Types: Cigarettes    Last attempt to quit: 01/21/2011  . Smokeless tobacco: Never Used     Comment: 2-3 times weekly  . Alcohol use No     Allergies   Patient has no known allergies.   Review of Systems Review of Systems All other systems negative except as documented in the HPI. All pertinent positives and negatives as reviewed in the  HPI.  Physical Exam Updated Vital Signs BP (!) 182/89   Pulse 65   Temp 98.3 F (36.8 C) (Oral)   Resp (!) 21   Ht 6' 4" (1.93 m)   Wt 122.5 kg   SpO2 100%   BMI 32.87 kg/m   Physical Exam  Constitutional: He is oriented to person, place, and time. He appears well-developed and well-nourished. No distress.  HENT:  Head: Normocephalic and atraumatic.  Mouth/Throat: Oropharynx is clear and moist.  Eyes: Pupils are equal, round, and reactive to light.  Neck: Normal range of motion. Neck supple.  Cardiovascular: Normal rate, regular rhythm and normal heart sounds.  Exam reveals no gallop and no friction rub.   No murmur heard. Pulmonary/Chest: Effort normal and breath sounds normal. No respiratory distress. He has no wheezes.  Abdominal: Soft. Bowel sounds are normal. He exhibits no distension. There is no tenderness.  Neurological: He is alert and oriented to person, place, and time. He exhibits normal muscle tone. Coordination normal.  Skin: Skin is warm and dry. Capillary refill takes less than 2 seconds. No rash noted. No erythema.  Psychiatric: He has a normal mood and affect. His behavior is normal.  Nursing note and vitals reviewed.    ED Treatments / Results  Labs (all labs ordered are listed, but only abnormal results are displayed) Labs Reviewed  COMPREHENSIVE METABOLIC PANEL - Abnormal; Notable for the following:       Result Value   Sodium 133 (*)    Glucose, Bld 224 (*)    All other components within normal limits  CBC WITH DIFFERENTIAL/PLATELET - Abnormal; Notable for the following:    RBC 3.77 (*)    Hemoglobin 11.1 (*)    HCT 33.2 (*)    All other components within normal limits  I-STAT CG4 LACTIC ACID, ED    EKG  EKG Interpretation None       Radiology No results found.  Procedures Procedures (including critical care time)  Medications Ordered in ED Medications  fentaNYL (SUBLIMAZE) injection 100 mcg (100 mcg Intravenous Given 08/28/16 1544)      Initial Impression / Assessment and Plan / ED Course  I have reviewed the triage vital signs and the nursing notes.  Pertinent labs & imaging results that were available during my care of the patient were reviewed by me and considered in my medical decision making (see chart for details).    Patient does not have any low back discomfort.  It seems to be palpable lateral hip pain and thigh pain.  Patient will get Doppler studies to further assess the blood clots.  Patient has chronic foot drop, so no neurological deficits other than that.  Final Clinical Impressions(s) / ED Diagnoses   Final diagnoses:  None    New Prescriptions New Prescriptions   No medications on file     Rebeca Allegra 08/28/16 1613    Elnora Morrison, MD 08/28/16 1649    Elnora Morrison, MD 08/28/16 [phone]

## 2016-08-28 NOTE — ED Notes (Signed)
Vascular tech is aware of patient 

## 2016-08-28 NOTE — ED Notes (Signed)
Vascular tech at bedside. °

## 2016-08-29 ENCOUNTER — Ambulatory Visit: Payer: Medicare Other | Admitting: Physical Therapy

## 2016-08-29 DIAGNOSIS — M961 Postlaminectomy syndrome, not elsewhere classified: Secondary | ICD-10-CM | POA: Diagnosis not present

## 2016-08-29 DIAGNOSIS — Z4789 Encounter for other orthopedic aftercare: Secondary | ICD-10-CM | POA: Diagnosis not present

## 2016-08-31 ENCOUNTER — Ambulatory Visit: Payer: Medicare Other | Admitting: Occupational Therapy

## 2016-09-05 ENCOUNTER — Encounter: Payer: Medicare Other | Admitting: Occupational Therapy

## 2016-09-05 DIAGNOSIS — R29898 Other symptoms and signs involving the musculoskeletal system: Secondary | ICD-10-CM | POA: Diagnosis not present

## 2016-09-05 DIAGNOSIS — M961 Postlaminectomy syndrome, not elsewhere classified: Secondary | ICD-10-CM | POA: Diagnosis not present

## 2016-09-05 DIAGNOSIS — Z4789 Encounter for other orthopedic aftercare: Secondary | ICD-10-CM | POA: Diagnosis not present

## 2016-09-07 ENCOUNTER — Ambulatory Visit: Payer: Medicare Other | Admitting: Physical Therapy

## 2016-09-07 ENCOUNTER — Encounter: Payer: Medicare Other | Admitting: Occupational Therapy

## 2016-09-12 ENCOUNTER — Encounter: Payer: Medicare Other | Admitting: Physical Medicine & Rehabilitation

## 2016-09-13 ENCOUNTER — Ambulatory Visit: Payer: Medicare Other | Admitting: Physical Therapy

## 2016-09-13 ENCOUNTER — Encounter: Payer: Medicare Other | Admitting: Occupational Therapy

## 2016-09-13 DIAGNOSIS — E119 Type 2 diabetes mellitus without complications: Secondary | ICD-10-CM | POA: Diagnosis not present

## 2016-09-13 DIAGNOSIS — H35363 Drusen (degenerative) of macula, bilateral: Secondary | ICD-10-CM | POA: Diagnosis not present

## 2016-09-13 DIAGNOSIS — H40013 Open angle with borderline findings, low risk, bilateral: Secondary | ICD-10-CM | POA: Diagnosis not present

## 2016-09-13 DIAGNOSIS — H25013 Cortical age-related cataract, bilateral: Secondary | ICD-10-CM | POA: Diagnosis not present

## 2016-09-15 ENCOUNTER — Encounter (HOSPITAL_COMMUNITY): Payer: Self-pay | Admitting: Orthopedic Surgery

## 2016-09-15 ENCOUNTER — Ambulatory Visit: Payer: Medicare Other | Admitting: Physical Therapy

## 2016-09-15 ENCOUNTER — Encounter: Payer: Medicare Other | Admitting: Occupational Therapy

## 2016-09-20 ENCOUNTER — Encounter: Payer: Medicare Other | Admitting: Occupational Therapy

## 2016-09-20 ENCOUNTER — Ambulatory Visit: Payer: Medicare Other | Admitting: Physical Therapy

## 2016-09-20 DIAGNOSIS — R262 Difficulty in walking, not elsewhere classified: Secondary | ICD-10-CM | POA: Diagnosis not present

## 2016-09-20 DIAGNOSIS — M5416 Radiculopathy, lumbar region: Secondary | ICD-10-CM | POA: Diagnosis not present

## 2016-09-22 ENCOUNTER — Ambulatory Visit: Payer: Medicare Other | Admitting: Physical Therapy

## 2016-09-22 ENCOUNTER — Encounter: Payer: Medicare Other | Admitting: Occupational Therapy

## 2016-09-23 NOTE — Addendum Note (Signed)
Addendum  created 09/23/16 1058 by Effie Berkshire, MD   Sign clinical note

## 2016-09-27 ENCOUNTER — Encounter: Payer: Medicare Other | Admitting: Occupational Therapy

## 2016-09-27 ENCOUNTER — Ambulatory Visit: Payer: Medicare Other | Admitting: Physical Therapy

## 2016-09-27 DIAGNOSIS — R262 Difficulty in walking, not elsewhere classified: Secondary | ICD-10-CM | POA: Diagnosis not present

## 2016-09-27 DIAGNOSIS — M5416 Radiculopathy, lumbar region: Secondary | ICD-10-CM | POA: Diagnosis not present

## 2016-09-29 ENCOUNTER — Encounter: Payer: Medicare Other | Admitting: Occupational Therapy

## 2016-09-29 ENCOUNTER — Ambulatory Visit: Payer: Medicare Other | Admitting: Physical Therapy

## 2016-09-29 DIAGNOSIS — R262 Difficulty in walking, not elsewhere classified: Secondary | ICD-10-CM | POA: Diagnosis not present

## 2016-09-29 DIAGNOSIS — M5416 Radiculopathy, lumbar region: Secondary | ICD-10-CM | POA: Diagnosis not present

## 2016-10-03 DIAGNOSIS — Z4789 Encounter for other orthopedic aftercare: Secondary | ICD-10-CM | POA: Diagnosis not present

## 2016-10-04 ENCOUNTER — Ambulatory Visit: Payer: Medicare Other | Admitting: Physical Therapy

## 2016-10-04 ENCOUNTER — Encounter: Payer: Medicare Other | Admitting: Occupational Therapy

## 2016-10-05 DIAGNOSIS — M5416 Radiculopathy, lumbar region: Secondary | ICD-10-CM | POA: Diagnosis not present

## 2016-10-05 DIAGNOSIS — R262 Difficulty in walking, not elsewhere classified: Secondary | ICD-10-CM | POA: Diagnosis not present

## 2016-10-06 ENCOUNTER — Encounter: Payer: Medicare Other | Admitting: Occupational Therapy

## 2016-10-06 ENCOUNTER — Ambulatory Visit: Payer: Medicare Other | Admitting: Physical Therapy

## 2016-10-06 DIAGNOSIS — M5416 Radiculopathy, lumbar region: Secondary | ICD-10-CM | POA: Diagnosis not present

## 2016-10-06 DIAGNOSIS — R262 Difficulty in walking, not elsewhere classified: Secondary | ICD-10-CM | POA: Diagnosis not present

## 2016-10-11 ENCOUNTER — Ambulatory Visit: Payer: Medicare Other | Admitting: Physical Therapy

## 2016-10-11 ENCOUNTER — Encounter: Payer: Medicare Other | Admitting: Occupational Therapy

## 2016-10-12 DIAGNOSIS — M5416 Radiculopathy, lumbar region: Secondary | ICD-10-CM | POA: Diagnosis not present

## 2016-10-12 DIAGNOSIS — R262 Difficulty in walking, not elsewhere classified: Secondary | ICD-10-CM | POA: Diagnosis not present

## 2016-10-13 ENCOUNTER — Ambulatory Visit: Payer: Medicare Other | Admitting: Physical Therapy

## 2016-10-13 ENCOUNTER — Encounter: Payer: Medicare Other | Admitting: Occupational Therapy

## 2016-10-13 DIAGNOSIS — Z4789 Encounter for other orthopedic aftercare: Secondary | ICD-10-CM | POA: Diagnosis not present

## 2016-10-13 DIAGNOSIS — Z79891 Long term (current) use of opiate analgesic: Secondary | ICD-10-CM | POA: Diagnosis not present

## 2016-10-13 DIAGNOSIS — M961 Postlaminectomy syndrome, not elsewhere classified: Secondary | ICD-10-CM | POA: Diagnosis not present

## 2016-10-13 DIAGNOSIS — M4807 Spinal stenosis, lumbosacral region: Secondary | ICD-10-CM | POA: Diagnosis not present

## 2016-10-14 DIAGNOSIS — R262 Difficulty in walking, not elsewhere classified: Secondary | ICD-10-CM | POA: Diagnosis not present

## 2016-10-14 DIAGNOSIS — M5416 Radiculopathy, lumbar region: Secondary | ICD-10-CM | POA: Diagnosis not present

## 2016-10-18 ENCOUNTER — Ambulatory Visit: Payer: Medicare Other | Admitting: Physical Therapy

## 2016-10-18 ENCOUNTER — Encounter: Payer: Medicare Other | Admitting: Occupational Therapy

## 2016-10-19 DIAGNOSIS — R262 Difficulty in walking, not elsewhere classified: Secondary | ICD-10-CM | POA: Diagnosis not present

## 2016-10-19 DIAGNOSIS — M5416 Radiculopathy, lumbar region: Secondary | ICD-10-CM | POA: Diagnosis not present

## 2016-10-20 ENCOUNTER — Encounter: Payer: Medicare Other | Admitting: Occupational Therapy

## 2016-10-20 ENCOUNTER — Ambulatory Visit: Payer: Medicare Other | Admitting: Physical Therapy

## 2016-10-21 DIAGNOSIS — M5416 Radiculopathy, lumbar region: Secondary | ICD-10-CM | POA: Diagnosis not present

## 2016-10-21 DIAGNOSIS — R262 Difficulty in walking, not elsewhere classified: Secondary | ICD-10-CM | POA: Diagnosis not present

## 2016-10-25 DIAGNOSIS — M5416 Radiculopathy, lumbar region: Secondary | ICD-10-CM | POA: Diagnosis not present

## 2016-10-25 DIAGNOSIS — R262 Difficulty in walking, not elsewhere classified: Secondary | ICD-10-CM | POA: Diagnosis not present

## 2016-10-27 DIAGNOSIS — R262 Difficulty in walking, not elsewhere classified: Secondary | ICD-10-CM | POA: Diagnosis not present

## 2016-10-27 DIAGNOSIS — M5416 Radiculopathy, lumbar region: Secondary | ICD-10-CM | POA: Diagnosis not present

## 2016-11-01 DIAGNOSIS — R262 Difficulty in walking, not elsewhere classified: Secondary | ICD-10-CM | POA: Diagnosis not present

## 2016-11-01 DIAGNOSIS — M5416 Radiculopathy, lumbar region: Secondary | ICD-10-CM | POA: Diagnosis not present

## 2016-11-02 DIAGNOSIS — E1129 Type 2 diabetes mellitus with other diabetic kidney complication: Secondary | ICD-10-CM | POA: Diagnosis not present

## 2016-11-02 DIAGNOSIS — R350 Frequency of micturition: Secondary | ICD-10-CM | POA: Diagnosis not present

## 2016-11-02 DIAGNOSIS — I10 Essential (primary) hypertension: Secondary | ICD-10-CM | POA: Diagnosis not present

## 2016-11-02 DIAGNOSIS — I82409 Acute embolism and thrombosis of unspecified deep veins of unspecified lower extremity: Secondary | ICD-10-CM | POA: Diagnosis not present

## 2016-11-02 DIAGNOSIS — M21379 Foot drop, unspecified foot: Secondary | ICD-10-CM | POA: Diagnosis not present

## 2016-11-02 DIAGNOSIS — Z7901 Long term (current) use of anticoagulants: Secondary | ICD-10-CM | POA: Diagnosis not present

## 2016-11-02 DIAGNOSIS — Z6835 Body mass index (BMI) 35.0-35.9, adult: Secondary | ICD-10-CM | POA: Diagnosis not present

## 2016-11-03 DIAGNOSIS — R262 Difficulty in walking, not elsewhere classified: Secondary | ICD-10-CM | POA: Diagnosis not present

## 2016-11-03 DIAGNOSIS — M5416 Radiculopathy, lumbar region: Secondary | ICD-10-CM | POA: Diagnosis not present

## 2016-11-08 DIAGNOSIS — M5416 Radiculopathy, lumbar region: Secondary | ICD-10-CM | POA: Diagnosis not present

## 2016-11-08 DIAGNOSIS — R262 Difficulty in walking, not elsewhere classified: Secondary | ICD-10-CM | POA: Diagnosis not present

## 2016-11-11 DIAGNOSIS — R262 Difficulty in walking, not elsewhere classified: Secondary | ICD-10-CM | POA: Diagnosis not present

## 2016-11-11 DIAGNOSIS — M5416 Radiculopathy, lumbar region: Secondary | ICD-10-CM | POA: Diagnosis not present

## 2016-11-15 DIAGNOSIS — M5416 Radiculopathy, lumbar region: Secondary | ICD-10-CM | POA: Diagnosis not present

## 2016-11-15 DIAGNOSIS — R262 Difficulty in walking, not elsewhere classified: Secondary | ICD-10-CM | POA: Diagnosis not present

## 2016-11-17 DIAGNOSIS — M5416 Radiculopathy, lumbar region: Secondary | ICD-10-CM | POA: Diagnosis not present

## 2016-11-17 DIAGNOSIS — R262 Difficulty in walking, not elsewhere classified: Secondary | ICD-10-CM | POA: Diagnosis not present

## 2016-11-21 DIAGNOSIS — M7711 Lateral epicondylitis, right elbow: Secondary | ICD-10-CM | POA: Diagnosis not present

## 2016-11-21 DIAGNOSIS — Z4789 Encounter for other orthopedic aftercare: Secondary | ICD-10-CM | POA: Diagnosis not present

## 2016-11-21 DIAGNOSIS — M4302 Spondylolysis, cervical region: Secondary | ICD-10-CM | POA: Diagnosis not present

## 2016-11-22 DIAGNOSIS — R262 Difficulty in walking, not elsewhere classified: Secondary | ICD-10-CM | POA: Diagnosis not present

## 2016-11-22 DIAGNOSIS — M5416 Radiculopathy, lumbar region: Secondary | ICD-10-CM | POA: Diagnosis not present

## 2016-11-25 DIAGNOSIS — R262 Difficulty in walking, not elsewhere classified: Secondary | ICD-10-CM | POA: Diagnosis not present

## 2016-11-25 DIAGNOSIS — M5416 Radiculopathy, lumbar region: Secondary | ICD-10-CM | POA: Diagnosis not present

## 2016-11-29 DIAGNOSIS — R262 Difficulty in walking, not elsewhere classified: Secondary | ICD-10-CM | POA: Diagnosis not present

## 2016-11-29 DIAGNOSIS — M5416 Radiculopathy, lumbar region: Secondary | ICD-10-CM | POA: Diagnosis not present

## 2016-12-01 DIAGNOSIS — R262 Difficulty in walking, not elsewhere classified: Secondary | ICD-10-CM | POA: Diagnosis not present

## 2016-12-01 DIAGNOSIS — M5416 Radiculopathy, lumbar region: Secondary | ICD-10-CM | POA: Diagnosis not present

## 2016-12-05 DIAGNOSIS — M5416 Radiculopathy, lumbar region: Secondary | ICD-10-CM | POA: Diagnosis not present

## 2016-12-05 DIAGNOSIS — R262 Difficulty in walking, not elsewhere classified: Secondary | ICD-10-CM | POA: Diagnosis not present

## 2016-12-12 DIAGNOSIS — M5416 Radiculopathy, lumbar region: Secondary | ICD-10-CM | POA: Diagnosis not present

## 2016-12-12 DIAGNOSIS — R262 Difficulty in walking, not elsewhere classified: Secondary | ICD-10-CM | POA: Diagnosis not present

## 2016-12-14 DIAGNOSIS — R262 Difficulty in walking, not elsewhere classified: Secondary | ICD-10-CM | POA: Diagnosis not present

## 2016-12-14 DIAGNOSIS — M5416 Radiculopathy, lumbar region: Secondary | ICD-10-CM | POA: Diagnosis not present

## 2016-12-19 DIAGNOSIS — M5416 Radiculopathy, lumbar region: Secondary | ICD-10-CM | POA: Diagnosis not present

## 2016-12-19 DIAGNOSIS — R262 Difficulty in walking, not elsewhere classified: Secondary | ICD-10-CM | POA: Diagnosis not present

## 2016-12-21 DIAGNOSIS — M5416 Radiculopathy, lumbar region: Secondary | ICD-10-CM | POA: Diagnosis not present

## 2016-12-21 DIAGNOSIS — R262 Difficulty in walking, not elsewhere classified: Secondary | ICD-10-CM | POA: Diagnosis not present

## 2016-12-27 DIAGNOSIS — M4807 Spinal stenosis, lumbosacral region: Secondary | ICD-10-CM | POA: Diagnosis not present

## 2016-12-27 DIAGNOSIS — M961 Postlaminectomy syndrome, not elsewhere classified: Secondary | ICD-10-CM | POA: Diagnosis not present

## 2016-12-29 ENCOUNTER — Telehealth: Payer: Self-pay | Admitting: Neurology

## 2016-12-29 NOTE — Telephone Encounter (Signed)
Pt called his CPAP is shorting out during the night-coming on and off. He has called [facility] but they did not mention anything about repairing it, just that medicare will pay every 5 yrs for CPAP. I skyped Myriam Jacobson, she advised patient to call [facility] to discuss having it repaired and if it cannot then he will call back and schedule an appt.  FYI

## 2016-12-30 DIAGNOSIS — R262 Difficulty in walking, not elsewhere classified: Secondary | ICD-10-CM | POA: Diagnosis not present

## 2016-12-30 DIAGNOSIS — M5416 Radiculopathy, lumbar region: Secondary | ICD-10-CM | POA: Diagnosis not present

## 2017-01-03 DIAGNOSIS — M5416 Radiculopathy, lumbar region: Secondary | ICD-10-CM | POA: Diagnosis not present

## 2017-01-03 DIAGNOSIS — R262 Difficulty in walking, not elsewhere classified: Secondary | ICD-10-CM | POA: Diagnosis not present

## 2017-01-05 DIAGNOSIS — M5416 Radiculopathy, lumbar region: Secondary | ICD-10-CM | POA: Diagnosis not present

## 2017-01-05 DIAGNOSIS — R262 Difficulty in walking, not elsewhere classified: Secondary | ICD-10-CM | POA: Diagnosis not present

## 2017-01-10 DIAGNOSIS — M5416 Radiculopathy, lumbar region: Secondary | ICD-10-CM | POA: Diagnosis not present

## 2017-01-10 DIAGNOSIS — R262 Difficulty in walking, not elsewhere classified: Secondary | ICD-10-CM | POA: Diagnosis not present

## 2017-01-12 DIAGNOSIS — R262 Difficulty in walking, not elsewhere classified: Secondary | ICD-10-CM | POA: Diagnosis not present

## 2017-01-12 DIAGNOSIS — M5416 Radiculopathy, lumbar region: Secondary | ICD-10-CM | POA: Diagnosis not present

## 2017-01-19 DIAGNOSIS — R262 Difficulty in walking, not elsewhere classified: Secondary | ICD-10-CM | POA: Diagnosis not present

## 2017-01-19 DIAGNOSIS — M5416 Radiculopathy, lumbar region: Secondary | ICD-10-CM | POA: Diagnosis not present

## 2017-01-24 DIAGNOSIS — R262 Difficulty in walking, not elsewhere classified: Secondary | ICD-10-CM | POA: Diagnosis not present

## 2017-01-24 DIAGNOSIS — M5416 Radiculopathy, lumbar region: Secondary | ICD-10-CM | POA: Diagnosis not present

## 2017-01-24 DIAGNOSIS — Z23 Encounter for immunization: Secondary | ICD-10-CM | POA: Diagnosis not present

## 2017-01-26 ENCOUNTER — Encounter: Payer: Self-pay | Admitting: Internal Medicine

## 2017-01-31 DIAGNOSIS — R262 Difficulty in walking, not elsewhere classified: Secondary | ICD-10-CM | POA: Diagnosis not present

## 2017-01-31 DIAGNOSIS — M5416 Radiculopathy, lumbar region: Secondary | ICD-10-CM | POA: Diagnosis not present

## 2017-02-01 DIAGNOSIS — N50811 Right testicular pain: Secondary | ICD-10-CM | POA: Diagnosis not present

## 2017-02-01 DIAGNOSIS — Z6835 Body mass index (BMI) 35.0-35.9, adult: Secondary | ICD-10-CM | POA: Diagnosis not present

## 2017-02-01 DIAGNOSIS — N390 Urinary tract infection, site not specified: Secondary | ICD-10-CM | POA: Diagnosis not present

## 2017-02-01 DIAGNOSIS — R8290 Unspecified abnormal findings in urine: Secondary | ICD-10-CM | POA: Diagnosis not present

## 2017-02-01 DIAGNOSIS — R350 Frequency of micturition: Secondary | ICD-10-CM | POA: Diagnosis not present

## 2017-02-13 DIAGNOSIS — E1140 Type 2 diabetes mellitus with diabetic neuropathy, unspecified: Secondary | ICD-10-CM | POA: Diagnosis not present

## 2017-02-13 DIAGNOSIS — Z125 Encounter for screening for malignant neoplasm of prostate: Secondary | ICD-10-CM | POA: Diagnosis not present

## 2017-02-13 DIAGNOSIS — E7849 Other hyperlipidemia: Secondary | ICD-10-CM | POA: Diagnosis not present

## 2017-02-13 DIAGNOSIS — R262 Difficulty in walking, not elsewhere classified: Secondary | ICD-10-CM | POA: Diagnosis not present

## 2017-02-13 DIAGNOSIS — Z0000 Encounter for general adult medical examination without abnormal findings: Secondary | ICD-10-CM | POA: Diagnosis not present

## 2017-02-13 DIAGNOSIS — M5416 Radiculopathy, lumbar region: Secondary | ICD-10-CM | POA: Diagnosis not present

## 2017-02-13 DIAGNOSIS — I10 Essential (primary) hypertension: Secondary | ICD-10-CM | POA: Diagnosis not present

## 2017-02-15 DIAGNOSIS — M5416 Radiculopathy, lumbar region: Secondary | ICD-10-CM | POA: Diagnosis not present

## 2017-02-15 DIAGNOSIS — R262 Difficulty in walking, not elsewhere classified: Secondary | ICD-10-CM | POA: Diagnosis not present

## 2017-02-16 DIAGNOSIS — Z86718 Personal history of other venous thrombosis and embolism: Secondary | ICD-10-CM | POA: Diagnosis not present

## 2017-02-16 DIAGNOSIS — Z23 Encounter for immunization: Secondary | ICD-10-CM | POA: Diagnosis not present

## 2017-02-16 DIAGNOSIS — G4733 Obstructive sleep apnea (adult) (pediatric): Secondary | ICD-10-CM | POA: Diagnosis not present

## 2017-02-16 DIAGNOSIS — M21371 Foot drop, right foot: Secondary | ICD-10-CM | POA: Diagnosis not present

## 2017-02-16 DIAGNOSIS — I10 Essential (primary) hypertension: Secondary | ICD-10-CM | POA: Diagnosis not present

## 2017-02-16 DIAGNOSIS — Z0000 Encounter for general adult medical examination without abnormal findings: Secondary | ICD-10-CM | POA: Diagnosis not present

## 2017-02-16 DIAGNOSIS — Z6836 Body mass index (BMI) 36.0-36.9, adult: Secondary | ICD-10-CM | POA: Diagnosis not present

## 2017-02-16 DIAGNOSIS — Z1389 Encounter for screening for other disorder: Secondary | ICD-10-CM | POA: Diagnosis not present

## 2017-02-16 DIAGNOSIS — E1140 Type 2 diabetes mellitus with diabetic neuropathy, unspecified: Secondary | ICD-10-CM | POA: Diagnosis not present

## 2017-02-16 DIAGNOSIS — M4712 Other spondylosis with myelopathy, cervical region: Secondary | ICD-10-CM | POA: Diagnosis not present

## 2017-02-16 DIAGNOSIS — E1129 Type 2 diabetes mellitus with other diabetic kidney complication: Secondary | ICD-10-CM | POA: Diagnosis not present

## 2017-02-16 DIAGNOSIS — M21372 Foot drop, left foot: Secondary | ICD-10-CM | POA: Diagnosis not present

## 2017-02-16 DIAGNOSIS — E7849 Other hyperlipidemia: Secondary | ICD-10-CM | POA: Diagnosis not present

## 2017-02-17 ENCOUNTER — Other Ambulatory Visit: Payer: Self-pay | Admitting: Internal Medicine

## 2017-02-17 DIAGNOSIS — F17200 Nicotine dependence, unspecified, uncomplicated: Secondary | ICD-10-CM

## 2017-02-17 DIAGNOSIS — Z0000 Encounter for general adult medical examination without abnormal findings: Secondary | ICD-10-CM

## 2017-02-21 ENCOUNTER — Ambulatory Visit
Admission: RE | Admit: 2017-02-21 | Discharge: 2017-02-21 | Disposition: A | Discharge status: Home / Self Care | Payer: Medicare Other | Source: Ambulatory Visit | Attending: Internal Medicine | Admitting: Internal Medicine

## 2017-02-21 DIAGNOSIS — Z0000 Encounter for general adult medical examination without abnormal findings: Secondary | ICD-10-CM

## 2017-02-21 DIAGNOSIS — F17200 Nicotine dependence, unspecified, uncomplicated: Secondary | ICD-10-CM

## 2017-02-21 DIAGNOSIS — Z87891 Personal history of nicotine dependence: Secondary | ICD-10-CM | POA: Diagnosis not present

## 2017-02-21 DIAGNOSIS — I77811 Abdominal aortic ectasia: Secondary | ICD-10-CM | POA: Insufficient documentation

## 2017-02-21 DIAGNOSIS — Z136 Encounter for screening for cardiovascular disorders: Secondary | ICD-10-CM | POA: Diagnosis not present

## 2017-02-22 DIAGNOSIS — M5416 Radiculopathy, lumbar region: Secondary | ICD-10-CM | POA: Diagnosis not present

## 2017-02-22 DIAGNOSIS — R262 Difficulty in walking, not elsewhere classified: Secondary | ICD-10-CM | POA: Diagnosis not present

## 2017-02-28 DIAGNOSIS — M961 Postlaminectomy syndrome, not elsewhere classified: Secondary | ICD-10-CM | POA: Diagnosis not present

## 2017-02-28 DIAGNOSIS — Z09 Encounter for follow-up examination after completed treatment for conditions other than malignant neoplasm: Secondary | ICD-10-CM | POA: Diagnosis not present

## 2017-03-01 DIAGNOSIS — M5416 Radiculopathy, lumbar region: Secondary | ICD-10-CM | POA: Diagnosis not present

## 2017-03-01 DIAGNOSIS — R262 Difficulty in walking, not elsewhere classified: Secondary | ICD-10-CM | POA: Diagnosis not present

## 2017-03-07 DIAGNOSIS — M5416 Radiculopathy, lumbar region: Secondary | ICD-10-CM | POA: Diagnosis not present

## 2017-03-07 DIAGNOSIS — R262 Difficulty in walking, not elsewhere classified: Secondary | ICD-10-CM | POA: Diagnosis not present

## 2017-03-09 DIAGNOSIS — R262 Difficulty in walking, not elsewhere classified: Secondary | ICD-10-CM | POA: Diagnosis not present

## 2017-03-09 DIAGNOSIS — M5416 Radiculopathy, lumbar region: Secondary | ICD-10-CM | POA: Diagnosis not present

## 2017-03-15 DIAGNOSIS — R262 Difficulty in walking, not elsewhere classified: Secondary | ICD-10-CM | POA: Diagnosis not present

## 2017-03-15 DIAGNOSIS — M5416 Radiculopathy, lumbar region: Secondary | ICD-10-CM | POA: Diagnosis not present

## 2017-03-21 DIAGNOSIS — R262 Difficulty in walking, not elsewhere classified: Secondary | ICD-10-CM | POA: Diagnosis not present

## 2017-03-21 DIAGNOSIS — M5416 Radiculopathy, lumbar region: Secondary | ICD-10-CM | POA: Diagnosis not present

## 2017-03-23 DIAGNOSIS — M5416 Radiculopathy, lumbar region: Secondary | ICD-10-CM | POA: Diagnosis not present

## 2017-03-23 DIAGNOSIS — R262 Difficulty in walking, not elsewhere classified: Secondary | ICD-10-CM | POA: Diagnosis not present

## 2017-03-24 ENCOUNTER — Ambulatory Visit (INDEPENDENT_AMBULATORY_CARE_PROVIDER_SITE_OTHER): Payer: Medicare Other | Admitting: Internal Medicine

## 2017-03-24 ENCOUNTER — Encounter: Payer: Self-pay | Admitting: Internal Medicine

## 2017-03-24 VITALS — BP 140/80 | HR 72 | Ht 72.75 in | Wt 278.5 lb

## 2017-03-24 DIAGNOSIS — E119 Type 2 diabetes mellitus without complications: Secondary | ICD-10-CM

## 2017-03-24 DIAGNOSIS — Z86010 Personal history of colonic polyps: Secondary | ICD-10-CM | POA: Diagnosis not present

## 2017-03-24 DIAGNOSIS — Z7902 Long term (current) use of antithrombotics/antiplatelets: Secondary | ICD-10-CM | POA: Diagnosis not present

## 2017-03-24 NOTE — Progress Notes (Signed)
HISTORY OF PRESENT ILLNESS:  Rodney Crane is a 66 y.o. male , [place] native and [employer] fan, with multiple medical problems who presents today with his wife regarding surveillance colonoscopy. I last saw the patient 05/03/2011 for surveillance colonoscopy. Previous index exam in [place] with tubular adenoma. His most recent colonoscopy in 2013 revealed multiple diminutive adenomas (6 all less than 5 mm) and mild sigmoid diverticulosis. Follow-up in 5 years recommended. The patient did receive a recall letter earlier this year. Unfortunately, since his last colonoscopy, he has had a number of significant medical problems. First, several years ago suffered CVA/TIA fortunately is on chronic Plavix therapy. Earlier this year he had traumatic cervical cord injury for which he required urgent decompression and cervical fusion. Subsequently developed deep vein thrombosis for which she was on Xarelto for 6 months. He has gradually improved regarding his cervical myelopathy and is in regular rehabilitation. His ambulation has significantly improved. She does have a history of sleep apnea and uses CPAP at night. 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 GI review of systems is entirely negative. In particular, no abdominal pain, change in bowel habits, or bleeding. He does have issues with constipation he will use MiraLAX. Review of outside blood work from May 2018 finds a unremarkable comprehensive metabolic panel except for elevated glucose. CBC unremarkable with hemoglobin 11.1.  REVIEW OF SYSTEMS:  All non-GI ROS negative unless otherwise stated in the history of present illness except for arthritis, muscular weakness  Past Medical History:  Diagnosis Date  . Arthritis   . Colon polyps   . CVA (cerebral infarction) 2010  . Diabetes mellitus   . Dizziness   . DVT (deep venous thrombosis) (HCC)    bilateral  . ED (erectile dysfunction)   . Foot drop   . H/O Bell's palsy   .  Hyperlipidemia   . Hypertension   . Obesity   . OSA on CPAP   . Spinal stenosis   . Stroke ([place])   . Unsteady gait     Past Surgical History:  Procedure Laterality Date  . CARPAL TUNNEL RELEASE Bilateral   . CERVICAL DISCECTOMY  2004  . LUMBAR DISC SURGERY     x6  . POSTERIOR CERVICAL FUSION/FORAMINOTOMY N/A 06/27/2016   Procedure: POSTERIOR CERVICAL FUSION C2-C5; C2-C4 Decompression;  Surgeon: Melina Schools, MD;  Location: [facility];  Service: Orthopedics;  Laterality: N/A;    Social History Rodney Crane  reports that he has been smoking cigarettes.  he has never used smokeless tobacco. He reports that he does not drink alcohol or use drugs.  family history includes Dementia in his mother; Diabetes in his mother; Hypertension in his brother; Lung cancer in his father; Multiple sclerosis in his sister; Sleep apnea in his brother.  No Known Allergies     PHYSICAL EXAMINATION: Vital signs: BP 140/80 (BP Location: Left Arm, Patient Position: Sitting, Cuff Size: Normal)   Pulse 72   Ht 6' 0.75" (1.848 m) Comment: height measured without shoes  Wt 278 lb 8 oz (126.3 kg)   BMI 37.00 kg/m   Constitutional: generally well-appearing, no acute distress Psychiatric: alert and oriented x3, cooperative Eyes: extraocular movements intact, anicteric, conjunctiva pink Mouth: oral pharynx moist, no lesions Neck: supple no lymphadenopathy Cardiovascular: heart regular rate and rhythm, no murmur Lungs: clear to auscultation bilaterally Abdomen: soft, obese, nontender, nondistended, no obvious ascites, no peritoneal signs, normal bowel sounds, no organomegaly Rectal: Deferred until colonoscopy Extremities: no clubbing, cyanosis,  or lower extremity edema bilaterally Skin: no lesions on visible extremities Neuro: Cranial nerves intact.   ASSESSMENT:  #1. History of multiple adenomatous colon polyps. Due for surveillance #2. Multiple interval medical problems since his last examination as  outlined. #3. On chronic Plavix therapy for cerebrovascular disease   PLAN:  #1. Recommend that he continue his rehabilitation for an additional 6 months. That point we can set up his surveillance colonoscopy. The patient is HIGH RISK as we will have him remain on Plavix for the examination. We discussed the pros and cons of this strategy. As well, we will need to hold his diabetic medications the morning of the procedure to avoid and wanted hypoglycemia.The nature of the procedure, as well as the risks, benefits, and alternatives were carefully and thoroughly reviewed with the patient. Ample time for discussion and questions allowed. The patient understood, was satisfied, and agreed to proceed. My CMA has put a note in to contact the patient in 6 months. They know to contact this office if they do not hear from [place] or have any questions or problems. #2. Consider prophylactic PPI such as omeprazole 20 mg daily given his comorbidities and the need for chronic platelet therapy.

## 2017-03-24 NOTE — Patient Instructions (Signed)
I will call you in 6 months to set up your colonoscopy

## 2017-03-27 DIAGNOSIS — R262 Difficulty in walking, not elsewhere classified: Secondary | ICD-10-CM | POA: Diagnosis not present

## 2017-03-27 DIAGNOSIS — M5416 Radiculopathy, lumbar region: Secondary | ICD-10-CM | POA: Diagnosis not present

## 2017-03-28 DIAGNOSIS — M4807 Spinal stenosis, lumbosacral region: Secondary | ICD-10-CM | POA: Diagnosis not present

## 2017-03-28 DIAGNOSIS — Z79891 Long term (current) use of opiate analgesic: Secondary | ICD-10-CM | POA: Diagnosis not present

## 2017-04-05 DIAGNOSIS — M5416 Radiculopathy, lumbar region: Secondary | ICD-10-CM | POA: Diagnosis not present

## 2017-04-05 DIAGNOSIS — R262 Difficulty in walking, not elsewhere classified: Secondary | ICD-10-CM | POA: Diagnosis not present

## 2017-04-10 DIAGNOSIS — M5416 Radiculopathy, lumbar region: Secondary | ICD-10-CM | POA: Diagnosis not present

## 2017-04-10 DIAGNOSIS — R262 Difficulty in walking, not elsewhere classified: Secondary | ICD-10-CM | POA: Diagnosis not present

## 2017-04-12 DIAGNOSIS — M5416 Radiculopathy, lumbar region: Secondary | ICD-10-CM | POA: Diagnosis not present

## 2017-04-12 DIAGNOSIS — R262 Difficulty in walking, not elsewhere classified: Secondary | ICD-10-CM | POA: Diagnosis not present

## 2017-04-24 DIAGNOSIS — M5416 Radiculopathy, lumbar region: Secondary | ICD-10-CM | POA: Diagnosis not present

## 2017-04-24 DIAGNOSIS — R262 Difficulty in walking, not elsewhere classified: Secondary | ICD-10-CM | POA: Diagnosis not present

## 2017-04-26 DIAGNOSIS — R262 Difficulty in walking, not elsewhere classified: Secondary | ICD-10-CM | POA: Diagnosis not present

## 2017-04-26 DIAGNOSIS — M5416 Radiculopathy, lumbar region: Secondary | ICD-10-CM | POA: Diagnosis not present

## 2017-04-27 ENCOUNTER — Telehealth: Payer: Self-pay

## 2017-04-27 NOTE — Telephone Encounter (Signed)
I called pt and reminded pt to bring his cpap to his appt on 05/01/17 with Dr. Rexene Alberts. Pt's cpap wireless data has not updated since September. Pt verbalized understanding.

## 2017-04-30 ENCOUNTER — Encounter: Payer: Self-pay | Admitting: Neurology

## 2017-05-01 ENCOUNTER — Ambulatory Visit (INDEPENDENT_AMBULATORY_CARE_PROVIDER_SITE_OTHER): Payer: Medicare Other | Admitting: Neurology

## 2017-05-01 ENCOUNTER — Encounter: Payer: Self-pay | Admitting: Neurology

## 2017-05-01 ENCOUNTER — Encounter (INDEPENDENT_AMBULATORY_CARE_PROVIDER_SITE_OTHER): Payer: Self-pay

## 2017-05-01 VITALS — BP 188/100 | HR 92 | Ht 74.0 in | Wt 279.0 lb

## 2017-05-01 DIAGNOSIS — Z9989 Dependence on other enabling machines and devices: Secondary | ICD-10-CM

## 2017-05-01 DIAGNOSIS — G4733 Obstructive sleep apnea (adult) (pediatric): Secondary | ICD-10-CM

## 2017-05-01 NOTE — Progress Notes (Signed)
Subjective:    Patient ID: Rodney Crane is a 67 y.o. male.  HPI     Interim history:   Mr. Maalouf is a very friendly 67 year old right-handed gentleman with an underlying medical history of hypertension, smoking, hyperlipidemia, type 2 diabetes, degenerative back disease, s/p 7 back surgeris, and stroke (left thalamic on MRI), who presents for follow-up consultation follow-up consultation of his stroke and sleep apnea, for her yearly check up. The patient is accompanied by his wife today. I last saw him on 04/27/2016, at which time he reported difficulty using his CPAP because of flare up of his back condition. He has had multiple back surgeries and was not able to sleep in his bed. He was using his pain medication sparingly. I encouraged him to try to use CPAP as much is possible. He had no new strokelike symptoms in the interim.  Today, 05/01/2017: I reviewed his CPAP compliance data from 04/01/2017 through 04/30/2017 which is a total of 30 days, during which time he used his machine every night with percent used days greater than 4 hours at 100%, indicating superb compliance. Average usage of 8 hours and 5 minutes, which is great, residual AHI suboptimal at 10.4 per hour, mostly secondary to obstructive events, leak on the lower end with the 95th percentile at 7.6 L/m on a pressure of 7 cm with EPR of 3. He reports that he was unable to get a new CPAP machine and replacing his old one. He did get a refurbished CPAP machine as his own machine could not be repaired or replaced as I understand. He is doing better. He had therapy, and HH therapy, now aqua therapy, some feeling returned in the toes. He uses a cane during the day and walker during the night. He needs a new Rx for supplies.   Of note, he presented to the emergency room on 08/28/2016 with right thigh pain and swelling. He had no neurological new focal findings. He has a chronic foot drop. He had an interim hospital admission for central  cord syndrome and had cervical spine surgery in March 2018. He had an EMG and nerve conduction test in February 2018 which I reviewed: Conclusion: There is electrodiagnostic evidence of chronic length-dependent sensorimotor axonal neuropathy. No suggestion of myopathy/myositis. 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 The Tibialis Anterior showed acute/ongoing denervation which could be due to radiculopathy or neuropathy. I would have expected more demyelinating-rage changes by now on NCV if the etiology of his weakness were due to demyelinating disease (such as Acute Inflammatory Demyelinating Polyneuropathy). Given history of worsening weakness after fall and neck injury, likely severe cervical stenosis as cause for his symptoms.   The patient's allergies, current medications, family history, past medical history, past social history, past surgical history and problem list were reviewed and updated as appropriate.   Previously (copied from previous notes for reference):   I saw him on 04/23/2015, at which time he reported doing well.he had a recent MRI with contrast on 01/01/15. He had seen Dr. Einar Gip in cardiology and a stress test, which was fine, per patient. His brain MRI w contrast did show evidence of an evolving L thalamic infarct. I had suggested a cardiology referral because of chest pains reported.    I reviewed his CPAP compliance data from 03/20/2016 through 04/18/2016, which is a total of 30 days, during which time he used his machine only 8 days. His last 90 day compliance  showed full compliance until 03/25/2016.   I saw him on 12/22/2014 at which time he reported a recent history of left facial numbness and tingling and subjective feeling of tongue swelling. On examination he had decreased sensation of his left midface and very slight right grip strength weakness. On MRI brain he had evidence of a recent left thalamic stroke. We  talked about stroke prevention, he was advised to continue with Plavix and I suggested a repeat brain MRI with contrast as well as a stroke consult with Dr. Erlinda Hong. I also suggested a cardiology referral due to chest pains reported.   I reviewed his CPAP compliance data from 03/22/15 through 04/20/15, which is a total of 30 days during which time the patient used CPAP every night, with percent used days greater than 4 hours at 100%, indicating superb compliance with an average usage of 7 hours and 6 minutes, residual AHI low at 1 per hour, leak low with the 95th percentile at 5.5 L/m on a pressure of 7 cm with EPR of 3.   I saw him on 01/15/2014 for sleep apnea follow-up at which time the patient reported doing well with CPAP and he was using it and was fully compliant with treatment. He reported that he had residual back pain and may need a spinal cord stimulator. He reported a 20 pound weight loss overall. I suggested a one-year follow-up.   He had a brain MRI without contrast on 12/04/2014: Punctate subacute infarction in the left thalamus. Mild leukoarosis, unchanged. This is most likely due to chronic microvascular disease. Increased signal intensity in the pons that may represent a demyelinating process versus chronic microvascular disease, unchanged. He had a carotid Doppler study at [facility] on 12/10/2014 with impression: Minimal bilateral atherosclerosis without flow limiting stenosis, less than 50% stenosis bilaterally.   He reports that he started having some symptoms about a month ago. He started having numbness and tingling in his left face and his tongue felt swollen. He had changes in his taste and report some loss of appetite. He has lost weight. He had some grip strength weakness on the right which his wife noted that he tried to downplay it at the time. She became really concerned that she also noted slurring of speech intermittently, especially when he was tired and  fatigued. He reports significant fatigue for the past month or 2. He has a history of foot drop and low back pain and these symptoms seem unchanged. Since his MRI earlier this month he was told to stop his baby aspirin and start Plavix. He has been taking Plavix regularly since then. He does admit that he was not always fully compliant with his baby aspirin. He had some intermittent chest pain. He did not bring this up necessarily at his doctor's appointment. He tries to work out regularly. He has been exercising at the gym 3 to even 4 times a week. He has not seen a cardiologist in years. He does admit to smoking some cigarettes. His wife provides additional information. She states that he tries to drink water and avoid sodas. He has improved in that regard. He still may not drink enough water. He is fully compliant with CPAP therapy. He had recent supplies delivered.   He had an MRI lumbar spine with and without contrast through Dr. Susa Day on 10/18/2006 (Indication: Left-sided back pain radiating to both legs. Weakness and numbness on the left side, ongoing for 60 months. History per lumbar surgery): 1.  Multilevel central, foraminal, and subarticular lateral recess stenoses as detailed above, with the most striking findings at the L3-L4 level. 2. There is a suggestion of dural enhancement and potentially peripheral nerve root clumping in the lower portion of the thecal sac, suspicious for mild arachnoiditis.   I reviewed his CPAP compliance data from 11/21/2014 through 12/20/2014 which is a total of 30 days during which time he used his machine every night with percent used days greater than 4 hours at 100%, indicating superb compliance with an average usage of 7 hours and 16 minutes, residual AHI at 0.8 per hour, leaked low with the 95th percentile at 3.5 L/m on a pressure of 7 cm with EPR of 3.   I saw him on 07/15/2013, at which time we talked about his sleep study results as well as his  compliance data. He reported having adjusted well to CPAP, but he had nasal congestion and some nasal dryness. He felt better and his wife endorsed that he slept better.   I reviewed his compliance data from 12/14/2013 through 01/12/2014 which is a total of 30 days during which time he used CPAP every night. Percent used days greater than 4 hours was 100%, indicating superb compliance. Average usage was 8 hours and 12 minutes, residual AHI low at 1.2 per hour and leak very low at 1.2 L per minute at the 95th percentile. Pressure at 7 cm with EPR of 3.     I first met him on 04/12/2013, at which time he complained of snoring and excessive daytime somnolence. I suggested he have a sleep study. He had a split-night sleep study on 05/28/2013 and went over his test results with him in detail today. His baseline sleep efficiency was reduced at 68.1% with a latency to sleep of 33 minutes and wake after sleep onset of 46.5 minutes with moderate to severe sleep fragmentation noted. His arousal index was mildly elevated. He had an increased percentage of stage I and stage II sleep, absence of slow-wave sleep, and a reduced percentage of REM sleep with a mildly prolonged REM latency. He had no significant EKG changes or periodic leg movements of sleep. He had moderate to loud snoring. He had a total AHI of 15.2 per hour, rising to 38 per hour in REM sleep. His baseline oxygen saturation was 95% with a nadir of 78% during REM sleep. He was then titrated on CPAP. His sleep efficiency improved. His arousal index improved. His sleep architecture showed an increased percentage of stage II sleep, absence of slow-wave sleep and a reduced percentage of REM sleep. Average oxygen saturation was 96%, nadir was 92%, greatly improved. He was titrated on CPAP from 5-7 cm with a reduction of his AHI to 0 per hour on the final pressure. He had REM sleep but no supine sleep was achieved during this part of the study. He indicated that he  does not sleep on his back. He is status post multiple back operations. Based on the test results I prescribed CPAP for him.   I reviewed the patient's CPAP compliance data from 06/09/2013 to 07/08/2013, which is a total of 30 days, during which time the patient used CPAP every day except for 1 day. The average usage for all days was 7 hours and 44 minutes. The percent used days greater than 4 hours was 97 %, indicating excellent compliance. The residual AHI was 1.3 per hour, indicating an appropriate treatment pressure of 7 cwp with EPR of 3.  I reviewed his compliance data of with his machine: 06/10/2013 through 07/14/2013 which is a total of 35 days during which time use CPAP every night with a percent used days greater than 4 hours of 100%, indicating excellent compliance, average usage was 7 hours and 47 minutes, residual AHI low at 1.3 per hour and leak was low at 1.4. Pressure was 7 cm with EPR of 3.  His Past Medical History Is Significant For: Past Medical History:  Diagnosis Date  . Arthritis   . Colon polyps   . CVA (cerebral infarction) 2010  . Diabetes mellitus   . Dizziness   . DVT (deep venous thrombosis) (HCC)    bilateral  . ED (erectile dysfunction)   . Foot drop   . H/O Bell's palsy   . Hyperlipidemia   . Hypertension   . Obesity   . OSA on CPAP   . Spinal stenosis   . Stroke ([place])   . Unsteady gait     His Past Surgical History Is Significant For: Past Surgical History:  Procedure Laterality Date  . CARPAL TUNNEL RELEASE Bilateral   . CERVICAL DISCECTOMY  2004  . LUMBAR DISC SURGERY     x6  . POSTERIOR CERVICAL FUSION/FORAMINOTOMY N/A 06/27/2016   Procedure: POSTERIOR CERVICAL FUSION C2-C5; C2-C4 Decompression;  Surgeon: Melina Schools, MD;  Location: [facility];  Service: Orthopedics;  Laterality: N/A;    His Family History Is Significant For: Family History  Problem Relation Age of Onset  . Multiple sclerosis Sister   . Dementia Mother   . Diabetes Mother    . Lung cancer Father   . Sleep apnea Brother   . Hypertension Brother   . Colon cancer Neg Hx   . Stomach cancer Neg Hx     His Social History Is Significant For: Social History   Socioeconomic History  . Marital status: Married    Spouse name: Nicki Reaper  . Number of children: 2  . Years of education: 38  . Highest education level: None  Social Needs  . Financial resource strain: None  . Food insecurity - worry: None  . Food insecurity - inability: None  . Transportation needs - medical: None  . Transportation needs - non-medical: None  Occupational History  . Occupation: RETIRED    Comment: retired  Tobacco Use  . Smoking status: Current Some Day Smoker    Types: Cigarettes    Last attempt to quit: 01/21/2011    Years since quitting: 6.2  . Smokeless tobacco: Never Used  . Tobacco comment: 2-3 times weekly  Substance and Sexual Activity  . Alcohol use: No  . Drug use: No  . Sexual activity: None  Other Topics Concern  . None  Social History Narrative   Patient is right handed and resides with wife    His Allergies Are:  No Known Allergies:   His Current Medications Are:  Outpatient Encounter Medications as of 05/01/2017  Medication Sig  . atorvastatin (LIPITOR) 40 MG tablet Take 0.5 tablets (20 mg total) by mouth at bedtime.  . blood glucose meter kit and supplies KIT Dispense based on patient and insurance preference. Use up to four times daily as directed. (FOR ICD-9 250.00, 250.01).  Marland Kitchen clopidogrel (PLAVIX) 75 MG tablet Take 1 tablet by mouth daily.  . cyclobenzaprine (FLEXERIL) 10 MG tablet Take 1 tablet (10 mg total) by mouth 2 (two) times daily as needed for muscle spasms. (Patient taking differently: Take 10 mg by mouth every 8 (  eight) hours as needed for muscle spasms. )  . metFORMIN (GLUCOPHAGE) 1000 MG tablet Take 1,000 mg by mouth 2 (two) times daily with a meal.  . metoprolol tartrate (LOPRESSOR) 25 MG tablet Take 25 mg by mouth 2 (two) times daily.  Marland Kitchen  oxyCODONE-acetaminophen (PERCOCET/ROXICET) 5-325 MG tablet Take 1 tablet by mouth 3 (three) times daily as needed.  . polyethylene glycol (MIRALAX / GLYCOLAX) packet Take 17 g by mouth 2 (two) times daily.  . pregabalin (LYRICA) 75 MG capsule Take 75 mg by mouth daily.  . tamsulosin (FLOMAX) 0.4 MG CAPS capsule Take 0.4 mg by mouth daily.  . valsartan-hydrochlorothiazide (DIOVAN-HCT) 160-12.5 MG tablet Take 1 tablet by mouth daily.  . [DISCONTINUED] metFORMIN (GLUCOPHAGE) 500 MG tablet Take 1 tablet (500 mg total) by mouth 2 (two) times daily with a meal.  . [DISCONTINUED] metoprolol succinate (TOPROL XL) 50 MG 24 hr tablet Take 1 tablet (50 mg total) by mouth daily.   No facility-administered encounter medications on file as of 05/01/2017.   :  Review of Systems:  Out of a complete 14 point review of systems, all are reviewed and negative with the exception of these symptoms as listed below: Review of Systems  Neurological:       Pt presents today to discuss his cpap. Pt has a refurbished machine from [facility] and is unsure if the pressure on it is set correctly since it is a refurbished machine.    Objective:  Neurological Exam  Physical Exam Physical Examination:   Vitals:   05/01/17 0918  BP: (!) 188/100  Pulse: 92    General Examination: The patient is a very pleasant 67 y.o. male in no acute distress. He appears well-developed and well-nourished and well groomed.   HEENT: Normocephalic, atraumatic, pupils are equal, round and reactive to light and accommodation. Extraocular tracking is good without limitation to gaze excursion or nystagmus noted. Normal smooth pursuit is noted. Hearing is grossly intact. Face is symmetric with normal facial animation and slight decrease in sensation in the right midface. Speech is clear to me with no dysarthria noted and no hypophonia. There is no lip, neck/head, jaw or voice tremor. Neck is supple with full range of passive and active motion.  Oropharynx exam reveals: no significant changed.   Chest: Clear to auscultation without wheezing, rhonchi or crackles noted.  Heart: S1+S2+0, regular and normal without murmurs, rubs or gallops noted.   Abdomen: Soft, non-tender and non-distended with normal bowel sounds appreciated on auscultation.  Extremities: There is no edema in the distal lower extremities bilaterally. R AFO in place for chronic R foot drop. His right leg is smaller in caliber than the L, unchanged.   Skin: Warm and dry without trophic changes noted. There are no varicose veins.  Musculoskeletal: exam reveals no obvious joint deformities, tenderness or joint swelling or erythema.   Neurologically:  Mental status: The patient is awake, alert and oriented in all 4 spheres. His memory, attention, language and knowledge are appropriate. There is no aphasia, agnosia, apraxia or anomia. Speech is clear with normal prosody and enunciation. Thought process is linear. Mood is congruent and affect is normal.  Cranial nerves are as described above under HEENT exam. In addition, shoulder shrug is normal with equal shoulder height noted. Motor exam: mild hand muscle wasting. Global strength of 4 out of 5. Slightly weaker on the right side.right foot drop is not new.Romberg is not tested for safety. Reflexes are 1+, in the UEs and trace  left knee, otherwise absent. Fine motor skills are mildly impaired.   Cerebellar testing shows no dysmetria or intention tremor. There is no truncal or gait ataxia.  Sensory exam is intact to light touch, but decreased in the distal LEs, right more than left.  Gait, station and balance: He has loss of lumbar lordosis and has a mildly stooped posture, unchanged . No veering to one side is noted. No leaning to one side is noted. Posture is mildly stooped and he uses a single point cane. Walks slowly and cautiously.  Assessment and Plan:   In summary, Damar Petit is a very pleasant 67 year old  male with an underlying medical history of hypertension, smoking, hyperlipidemia, type 2 diabetes, degenerative back disease, s/p multiple (7?) back surgeris, and prior TIAs and left thalamic stroke, who presents for FU consultation of his OSA on CPAP and Hx of thalamic stroke in the past. He is recent s/p neck surgery for cervical myelopathy. He has come a long way and is working hard on rehab. He is commended for his superb CPAP compliance, has a refurbished CPAP currently. Is residual AHI is suboptimal around 10 per hour. I would like to increase his CPAP pressure to 8 cm at this time. He also needs new supplies. I provided an order for new supplies which we will send to his DME company directly as well as the pressure change. He is advised to call us this week if he does not hear back from his DME company. I suggested a one-year checkup for sleep apnea. Neurologically otherwise he has done well. His strength and fine motor skills have improved. He went from wheelchair to walker and is now mostly able to use his cane. This is remarkable. I answered all their questions today and they were in agreement with the plan. I spent 30 minutes in total face-to-face time with the patient, more than 50% of which was spent in counseling and coordination of care, reviewing test results, reviewing medication and discussing or reviewing the diagnosis of OSA, its prognosis and treatment options. Pertinent laboratory and imaging test results that were available during this visit with the patient were reviewed by me and considered in my medical decision making (see chart for details).

## 2017-05-01 NOTE — Patient Instructions (Addendum)
Please continue using your CPAP regularly. While your insurance requires that you use CPAP at least 4 hours each night on 70% of the nights, I recommend, that you not skip any nights and use it throughout the night if you can. Getting used to CPAP and staying with the treatment long term does take time and patience and discipline. Untreated obstructive sleep apnea when it is moderate to severe can have an adverse impact on cardiovascular health and raise her risk for heart disease, arrhythmias, hypertension, congestive heart failure, stroke and diabetes. Untreated obstructive sleep apnea causes sleep disruption, nonrestorative sleep, and sleep deprivation. This can have an impact on your day to day functioning and cause daytime sleepiness and impairment of cognitive function, memory loss, mood disturbance, and problems focussing. Using CPAP regularly can improve these symptoms.   You have done so well with the CPAP. At this point, I would like to increase the pressure to 8 cm. I will renew your supply. If you don't hear back in the next 2 days, let us know immediately.

## 2017-05-02 DIAGNOSIS — R262 Difficulty in walking, not elsewhere classified: Secondary | ICD-10-CM | POA: Diagnosis not present

## 2017-05-02 DIAGNOSIS — M5416 Radiculopathy, lumbar region: Secondary | ICD-10-CM | POA: Diagnosis not present

## 2017-05-04 DIAGNOSIS — R262 Difficulty in walking, not elsewhere classified: Secondary | ICD-10-CM | POA: Diagnosis not present

## 2017-05-04 DIAGNOSIS — M5416 Radiculopathy, lumbar region: Secondary | ICD-10-CM | POA: Diagnosis not present

## 2017-05-09 DIAGNOSIS — M5416 Radiculopathy, lumbar region: Secondary | ICD-10-CM | POA: Diagnosis not present

## 2017-05-09 DIAGNOSIS — R262 Difficulty in walking, not elsewhere classified: Secondary | ICD-10-CM | POA: Diagnosis not present

## 2017-05-16 DIAGNOSIS — R262 Difficulty in walking, not elsewhere classified: Secondary | ICD-10-CM | POA: Diagnosis not present

## 2017-05-16 DIAGNOSIS — M5416 Radiculopathy, lumbar region: Secondary | ICD-10-CM | POA: Diagnosis not present

## 2017-05-18 DIAGNOSIS — M5416 Radiculopathy, lumbar region: Secondary | ICD-10-CM | POA: Diagnosis not present

## 2017-05-18 DIAGNOSIS — R262 Difficulty in walking, not elsewhere classified: Secondary | ICD-10-CM | POA: Diagnosis not present

## 2017-05-23 DIAGNOSIS — M5416 Radiculopathy, lumbar region: Secondary | ICD-10-CM | POA: Diagnosis not present

## 2017-05-23 DIAGNOSIS — R262 Difficulty in walking, not elsewhere classified: Secondary | ICD-10-CM | POA: Diagnosis not present

## 2017-05-25 DIAGNOSIS — R262 Difficulty in walking, not elsewhere classified: Secondary | ICD-10-CM | POA: Diagnosis not present

## 2017-05-25 DIAGNOSIS — M5416 Radiculopathy, lumbar region: Secondary | ICD-10-CM | POA: Diagnosis not present

## 2017-05-29 DIAGNOSIS — R262 Difficulty in walking, not elsewhere classified: Secondary | ICD-10-CM | POA: Diagnosis not present

## 2017-05-29 DIAGNOSIS — M5416 Radiculopathy, lumbar region: Secondary | ICD-10-CM | POA: Diagnosis not present

## 2017-06-06 DIAGNOSIS — R262 Difficulty in walking, not elsewhere classified: Secondary | ICD-10-CM | POA: Diagnosis not present

## 2017-06-06 DIAGNOSIS — M5416 Radiculopathy, lumbar region: Secondary | ICD-10-CM | POA: Diagnosis not present

## 2017-06-08 DIAGNOSIS — R262 Difficulty in walking, not elsewhere classified: Secondary | ICD-10-CM | POA: Diagnosis not present

## 2017-06-08 DIAGNOSIS — M5416 Radiculopathy, lumbar region: Secondary | ICD-10-CM | POA: Diagnosis not present

## 2017-06-13 DIAGNOSIS — L300 Nummular dermatitis: Secondary | ICD-10-CM | POA: Diagnosis not present

## 2017-06-13 DIAGNOSIS — B360 Pityriasis versicolor: Secondary | ICD-10-CM | POA: Diagnosis not present

## 2017-06-13 DIAGNOSIS — R262 Difficulty in walking, not elsewhere classified: Secondary | ICD-10-CM | POA: Diagnosis not present

## 2017-06-13 DIAGNOSIS — M5416 Radiculopathy, lumbar region: Secondary | ICD-10-CM | POA: Diagnosis not present

## 2017-06-15 DIAGNOSIS — R262 Difficulty in walking, not elsewhere classified: Secondary | ICD-10-CM | POA: Diagnosis not present

## 2017-06-15 DIAGNOSIS — M5416 Radiculopathy, lumbar region: Secondary | ICD-10-CM | POA: Diagnosis not present

## 2017-06-19 DIAGNOSIS — Z6836 Body mass index (BMI) 36.0-36.9, adult: Secondary | ICD-10-CM | POA: Diagnosis not present

## 2017-06-19 DIAGNOSIS — E1129 Type 2 diabetes mellitus with other diabetic kidney complication: Secondary | ICD-10-CM | POA: Diagnosis not present

## 2017-06-19 DIAGNOSIS — E7849 Other hyperlipidemia: Secondary | ICD-10-CM | POA: Diagnosis not present

## 2017-06-19 DIAGNOSIS — M4712 Other spondylosis with myelopathy, cervical region: Secondary | ICD-10-CM | POA: Diagnosis not present

## 2017-06-19 DIAGNOSIS — E1140 Type 2 diabetes mellitus with diabetic neuropathy, unspecified: Secondary | ICD-10-CM | POA: Diagnosis not present

## 2017-06-19 DIAGNOSIS — I10 Essential (primary) hypertension: Secondary | ICD-10-CM | POA: Diagnosis not present

## 2017-07-17 DIAGNOSIS — M47812 Spondylosis without myelopathy or radiculopathy, cervical region: Secondary | ICD-10-CM | POA: Diagnosis not present

## 2017-07-17 DIAGNOSIS — Z4889 Encounter for other specified surgical aftercare: Secondary | ICD-10-CM | POA: Diagnosis not present

## 2017-09-01 DIAGNOSIS — G894 Chronic pain syndrome: Secondary | ICD-10-CM | POA: Diagnosis not present

## 2018-01-01 DIAGNOSIS — H2513 Age-related nuclear cataract, bilateral: Secondary | ICD-10-CM | POA: Diagnosis not present

## 2018-01-01 DIAGNOSIS — H25013 Cortical age-related cataract, bilateral: Secondary | ICD-10-CM | POA: Diagnosis not present

## 2018-01-01 DIAGNOSIS — H40013 Open angle with borderline findings, low risk, bilateral: Secondary | ICD-10-CM | POA: Diagnosis not present

## 2018-01-01 DIAGNOSIS — E119 Type 2 diabetes mellitus without complications: Secondary | ICD-10-CM | POA: Diagnosis not present

## 2018-01-23 DIAGNOSIS — Z6836 Body mass index (BMI) 36.0-36.9, adult: Secondary | ICD-10-CM | POA: Diagnosis not present

## 2018-01-23 DIAGNOSIS — Z8673 Personal history of transient ischemic attack (TIA), and cerebral infarction without residual deficits: Secondary | ICD-10-CM | POA: Diagnosis not present

## 2018-01-23 DIAGNOSIS — E1129 Type 2 diabetes mellitus with other diabetic kidney complication: Secondary | ICD-10-CM | POA: Diagnosis not present

## 2018-01-23 DIAGNOSIS — I10 Essential (primary) hypertension: Secondary | ICD-10-CM | POA: Diagnosis not present

## 2018-01-23 DIAGNOSIS — E1140 Type 2 diabetes mellitus with diabetic neuropathy, unspecified: Secondary | ICD-10-CM | POA: Diagnosis not present

## 2018-01-23 DIAGNOSIS — M21379 Foot drop, unspecified foot: Secondary | ICD-10-CM | POA: Diagnosis not present

## 2018-01-23 DIAGNOSIS — E7849 Other hyperlipidemia: Secondary | ICD-10-CM | POA: Diagnosis not present

## 2018-01-23 DIAGNOSIS — L84 Corns and callosities: Secondary | ICD-10-CM | POA: Diagnosis not present

## 2018-02-06 ENCOUNTER — Ambulatory Visit (INDEPENDENT_AMBULATORY_CARE_PROVIDER_SITE_OTHER): Payer: Medicare Other | Admitting: Podiatry

## 2018-02-06 ENCOUNTER — Encounter: Payer: Self-pay | Admitting: Podiatry

## 2018-02-06 DIAGNOSIS — L84 Corns and callosities: Secondary | ICD-10-CM

## 2018-02-06 DIAGNOSIS — E0843 Diabetes mellitus due to underlying condition with diabetic autonomic (poly)neuropathy: Secondary | ICD-10-CM

## 2018-02-06 DIAGNOSIS — M21371 Foot drop, right foot: Secondary | ICD-10-CM | POA: Diagnosis not present

## 2018-02-12 DIAGNOSIS — Z23 Encounter for immunization: Secondary | ICD-10-CM | POA: Diagnosis not present

## 2018-02-12 DIAGNOSIS — Z79899 Other long term (current) drug therapy: Secondary | ICD-10-CM | POA: Diagnosis not present

## 2018-02-12 NOTE — Progress Notes (Signed)
   HPI: 67 year old male presents the office today as a new patient for evaluation of a dark callus on the back of the right heel.  Lesion is been present for approximately 2-3 months now.  Patient does not have pain.  He is a type II diabetic with manifestations of neuropathy.  He is currently taking Lyrica but he states it does not help alleviate his symptoms.  He also has a history of cervical spine fracture.  He presents for follow-up treatment evaluation.  Past Medical History:  Diagnosis Date  . Arthritis   . Colon polyps   . CVA (cerebral infarction) 2010  . Diabetes mellitus   . Dizziness   . DVT (deep venous thrombosis) (HCC)    bilateral  . ED (erectile dysfunction)   . Foot drop   . H/O Bell's palsy   . Hyperlipidemia   . Hypertension   . Obesity   . OSA on CPAP   . Spinal stenosis   . Stroke ([place])   . Unsteady gait      Physical Exam: General: The patient is alert and oriented x3 in no acute distress.  Dermatology: Hyperkeratotic hyperpigmented callus lesion noted to the posterior aspect of the right heel.  Skin is warm, dry and supple bilateral lower extremities. Negative for open lesions or macerations.  Vascular: Palpable pedal pulses bilaterally. No edema or erythema noted. Capillary refill within normal limits.  Neurological: Epicritic and protective threshold diminished bilaterally.   Musculoskeletal Exam: Range of motion within normal limits to all pedal and ankle joints bilateral.  Foot drop noted to the right lower extremity  Assessment: 1.  Foot drop right lower extremity 2.  Diabetes mellitus with polyneuropathy 3.  Pre-ulcerative callus lesion right posterior heel   Plan of Care:  1. Patient evaluated.   2.  Excisional debridement of the callus lesion was performed using a chisel blade without incident or bleeding. 3.  Continue wearing the ankle-foot orthosis 4.  Today we are going to make an appointment with Liliane Channel, Pedorthist, for diabetic shoes  with Plastizote insoles 5.  Return to clinic as needed      Edrick Kins, DPM [facility]  Dr. Edrick Kins, DPM    [address], [place] [address]                Office [phone]  Fax [phone]

## 2018-02-14 ENCOUNTER — Ambulatory Visit: Payer: Medicare Other | Admitting: Orthotics

## 2018-02-14 DIAGNOSIS — L84 Corns and callosities: Secondary | ICD-10-CM

## 2018-02-14 DIAGNOSIS — M21371 Foot drop, right foot: Secondary | ICD-10-CM

## 2018-02-14 NOTE — Progress Notes (Signed)
Patient presents today for diabetic shoe measurement and foam casting.  Goals of diabetic shoes/inserts to offer protection from conditions secondary to DM2, offer relief from sheer forces that could lead to ulcerations, protect the foot, and offer greater stability. Patient is under supervision of DPM [place] Physician managing patients DM2: Marton Redwood Patient has following documented conditions to qualify for diabetic shoes/inserts: see doc chart notes Patient measured with brannock device: 10.5

## 2018-03-14 ENCOUNTER — Ambulatory Visit (INDEPENDENT_AMBULATORY_CARE_PROVIDER_SITE_OTHER): Payer: Medicare Other | Admitting: Orthotics

## 2018-03-14 DIAGNOSIS — M21371 Foot drop, right foot: Secondary | ICD-10-CM | POA: Diagnosis not present

## 2018-03-14 DIAGNOSIS — E0843 Diabetes mellitus due to underlying condition with diabetic autonomic (poly)neuropathy: Secondary | ICD-10-CM | POA: Diagnosis not present

## 2018-03-14 DIAGNOSIS — L84 Corns and callosities: Secondary | ICD-10-CM

## 2018-04-10 NOTE — Progress Notes (Signed)

## 2018-04-12 ENCOUNTER — Encounter: Payer: Self-pay | Admitting: Podiatry

## 2018-04-12 ENCOUNTER — Ambulatory Visit (INDEPENDENT_AMBULATORY_CARE_PROVIDER_SITE_OTHER): Payer: Medicare Other | Admitting: Podiatry

## 2018-04-12 DIAGNOSIS — L84 Corns and callosities: Secondary | ICD-10-CM | POA: Diagnosis not present

## 2018-04-12 DIAGNOSIS — B351 Tinea unguium: Secondary | ICD-10-CM | POA: Diagnosis not present

## 2018-04-12 DIAGNOSIS — M79675 Pain in left toe(s): Secondary | ICD-10-CM

## 2018-04-12 DIAGNOSIS — M79674 Pain in right toe(s): Secondary | ICD-10-CM | POA: Diagnosis not present

## 2018-04-12 DIAGNOSIS — E1142 Type 2 diabetes mellitus with diabetic polyneuropathy: Secondary | ICD-10-CM

## 2018-04-12 NOTE — Progress Notes (Signed)
Complaint:  Visit Type: Patient returns to my office for continued preventative foot care services. 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  The patient presents for preventative foot care services.  He says he was sen by Dr.  Amalia Hailey last time who trimmed his blackened callus right heel. No changes to ROS  Podiatric Exam: Vascular: dorsalis pedis and posterior tibial pulses are palpable bilateral. Capillary return is immediate. Temperature gradient is WNL. Skin turgor WNL  Sensorium: Diminished  Semmes Weinstein monofilament test. Diminished  tactile sensation bilaterally. Nail Exam: Pt has thick disfigured discolored nails with subungual debris noted bilateral entire nail hallux through fifth toenails Ulcer Exam: There is no evidence of ulcer or pre-ulcerative changes or infection. Orthopedic Exam: Muscle tone and strength are WNL. No limitations in general ROM. No crepitus or effusions noted. Foot type and digits show no abnormalities. Bony prominences are unremarkable. Skin: No Porokeratosis. No infection or ulcers.  Blackened heel callus medial aspect right heel.  Diagnosis:  Onychomycosis, , Pain in right toe, pain in left toes  Heel Callus right foot  Treatment & Plan Procedures and Treatment: Consent by patient was obtained for treatment procedures.   Debridement of mycotic and hypertrophic toenails, 1 through 5 bilateral and clearing of subungual debris. No ulceration, no infection noted. Debride callus right heel. Return Visit-Office Procedure: Patient instructed to return to the office for a follow up visit 3 months for continued evaluation and treatment.    Gardiner Barefoot DPM

## 2018-05-01 ENCOUNTER — Telehealth: Payer: Self-pay

## 2018-05-01 NOTE — Telephone Encounter (Signed)
I called pt and reminded him to bring his cpap to his appt with Dr. Rexene Alberts tomorrow. Pt verbalized understanding.

## 2018-05-02 ENCOUNTER — Telehealth: Payer: Self-pay

## 2018-05-02 ENCOUNTER — Ambulatory Visit: Payer: Medicare Other | Admitting: Neurology

## 2018-05-02 NOTE — Telephone Encounter (Signed)
Pt did not show for their appt with Dr. Athar today.  

## 2018-05-03 ENCOUNTER — Telehealth: Payer: Self-pay

## 2018-05-03 ENCOUNTER — Encounter: Payer: Self-pay | Admitting: Neurology

## 2018-05-03 NOTE — Telephone Encounter (Signed)
I called pt and reminded him to bring his cpap to his appt with Dr. Rexene Alberts on Monday. I reminded pt of his appt date and time. Pt verbalized understanding.

## 2018-05-07 ENCOUNTER — Ambulatory Visit (INDEPENDENT_AMBULATORY_CARE_PROVIDER_SITE_OTHER): Payer: Medicare Other | Admitting: Neurology

## 2018-05-07 ENCOUNTER — Encounter: Payer: Self-pay | Admitting: Neurology

## 2018-05-07 VITALS — BP 159/90 | HR 72 | Ht 75.0 in | Wt 279.0 lb

## 2018-05-07 DIAGNOSIS — Z9989 Dependence on other enabling machines and devices: Secondary | ICD-10-CM

## 2018-05-07 DIAGNOSIS — Z8673 Personal history of transient ischemic attack (TIA), and cerebral infarction without residual deficits: Secondary | ICD-10-CM | POA: Diagnosis not present

## 2018-05-07 DIAGNOSIS — G4733 Obstructive sleep apnea (adult) (pediatric): Secondary | ICD-10-CM

## 2018-05-07 NOTE — Patient Instructions (Signed)
You should be eligible for a new CPAP machine by mid next month, please get in touch with [facility] about the date and they can send me a request. You can also call me at that point. You may need a follow up within 90 days, if you do start on new equipment.

## 2018-05-07 NOTE — Progress Notes (Signed)
Subjective:    Patient ID: Rodney Crane is a 68 y.o. male.  HPI     Interim history:   Mr. Rodney Crane is a very friendly 68 year old right-handed gentleman with an underlying medical history of hypertension, smoking, hyperlipidemia, type 2 diabetes, degenerative back disease, s/p 7 back surgeries, and stroke (left thalamic on MRI), who presents for follow-up consultation follow-up consultation of his sleep apnea, for his yearly check up. The patient is accompanied by his wife again today. I last saw him on 05/01/2017, at which time he was compliant with his CPAP. I increased his pressure at the time because of residual sleep-disordered breathing noted. From the neurological standpoint he was stable. He was advised to follow-up in one year.  Today, 05/07/2018: I reviewed his CPAP compliance data from 04/08/2018 through 05/07/2018 which is a total of 30 days, during which time he used his machine 29 days with percent used days greater than 4 hours at 97%, indicating excellent compliance with an average usage of 8 hours and 20 minutes, residual AHI borderline at 6.6 per hour, leak acceptable with the 95th percentile at 8.1 L/m on a pressure of 7 cm with EPR of 3. Of note, he may be eligible for a new CPAP machine as he was set up with CPAP in February 2015. He reports doing well with his CPAP, he is compliant with treatment, he would like to see about getting her new machine if possible. His DME company is [employer]. Neurologically, he feels stable, no new symptoms. He uses a cane. He still wears an AFO on the right.   The patient's allergies, current medications, family history, past medical history, past social history, past surgical history and problem list were reviewed and updated as appropriate.    Previously (copied from previous notes for reference):    Of note, he presented to the emergency room on 08/28/2016 with right thigh pain and swelling. He had no neurological new focal  findings. He has a chronic foot drop. He had an interim hospital admission for central cord syndrome and had cervical spine surgery in March 2018. He had an EMG and nerve conduction test in February 2018 which I reviewed: Conclusion: There is electrodiagnostic evidence of chronic length-dependent sensorimotor axonal neuropathy. No suggestion of myopathy/myositis. 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 The Tibialis Anterior showed acute/ongoing denervation which could be due to radiculopathy or neuropathy. I would have expected more demyelinating-rage changes by now on NCV if the etiology of his weakness were due to demyelinating disease (such as Acute Inflammatory Demyelinating Polyneuropathy). Given history of worsening weakness after fall and neck injury, likely severe cervical stenosis as cause for his symptoms.    I saw him on 04/27/2016, at which time he reported difficulty using his CPAP because of flare up of his back condition. He has had multiple back surgeries and was not able to sleep in his bed. He was using his pain medication sparingly. I encouraged him to try to use CPAP as much is possible. He had no new strokelike symptoms in the interim.   I reviewed his CPAP compliance data from 04/01/2017 through 04/30/2017 which is a total of 30 days, during which time he used his machine every night with percent used days greater than 4 hours at 100%, indicating superb compliance. Average usage of 8 hours and 5 minutes, which is great, residual AHI suboptimal at 10.4 per hour, mostly secondary to obstructive events, leak  on the lower end with the 95th percentile at 7.6 L/m on a pressure of 7 cm with EPR of 3.  I saw him on 04/23/2015, at which time he reported doing well.he had a recent MRI with contrast on 01/01/15. He had seen Dr. Einar Gip in cardiology and a stress test, which was fine, per patient. His brain MRI w contrast did show evidence of  an evolving L thalamic infarct. I had suggested a cardiology referral because of chest pains reported.    I reviewed his CPAP compliance data from 03/20/2016 through 04/18/2016, which is a total of 30 days, during which time he used his machine only 8 days. His last 90 day compliance showed full compliance until 03/25/2016.   I saw him on 12/22/2014 at which time he reported a recent history of left facial numbness and tingling and subjective feeling of tongue swelling. On examination he had decreased sensation of his left midface and very slight right grip strength weakness. On MRI brain he had evidence of a recent left thalamic stroke. We talked about stroke prevention, he was advised to continue with Plavix and I suggested a repeat brain MRI with contrast as well as a stroke consult with Dr. Erlinda Hong. I also suggested a cardiology referral due to chest pains reported.   I reviewed his CPAP compliance data from 03/22/15 through 04/20/15, which is a total of 30 days during which time the patient used CPAP every night, with percent used days greater than 4 hours at 100%, indicating superb compliance with an average usage of 7 hours and 6 minutes, residual AHI low at 1 per hour, leak low with the 95th percentile at 5.5 L/m on a pressure of 7 cm with EPR of 3.   I saw him on 01/15/2014 for sleep apnea follow-up at which time the patient reported doing well with CPAP and he was using it and was fully compliant with treatment. He reported that he had residual back pain and may need a spinal cord stimulator. He reported a 20 pound weight loss overall. I suggested a one-year follow-up.   He had a brain MRI without contrast on 12/04/2014: Punctate subacute infarction in the left thalamus. Mild leukoarosis, unchanged. This is most likely due to chronic microvascular disease. Increased signal intensity in the pons that may represent a demyelinating process versus chronic microvascular disease, unchanged. He had a carotid  Doppler study at [facility] on 12/10/2014 with impression: Minimal bilateral atherosclerosis without flow limiting stenosis, less than 50% stenosis bilaterally.   He reports that he started having some symptoms about a month ago. He started having numbness and tingling in his left face and his tongue felt swollen. He had changes in his taste and report some loss of appetite. He has lost weight. He had some grip strength weakness on the right which his wife noted that he tried to downplay it at the time. She became really concerned that she also noted slurring of speech intermittently, especially when he was tired and fatigued. He reports significant fatigue for the past month or 2. He has a history of foot drop and low back pain and these symptoms seem unchanged. Since his MRI earlier this month he was told to stop his baby aspirin and start Plavix. He has been taking Plavix regularly since then. He does admit that he was not always fully compliant with his baby aspirin. He had some intermittent chest pain. He did not bring this up necessarily at his doctor's appointment.  He tries to work out regularly. He has been exercising at the gym 3 to even 4 times a week. He has not seen a cardiologist in years. He does admit to smoking some cigarettes. His wife provides additional information. She states that he tries to drink water and avoid sodas. He has improved in that regard. He still may not drink enough water. He is fully compliant with CPAP therapy. He had recent supplies delivered.   He had an MRI lumbar spine with and without contrast through Dr. Susa Day on 10/18/2006 (Indication: Left-sided back pain radiating to both legs. Weakness and numbness on the left side, ongoing for 60 months. History per lumbar surgery): 1. Multilevel central, foraminal, and subarticular lateral recess stenoses as detailed above, with the most striking findings at the L3-L4 level. 2. There is a suggestion of  dural enhancement and potentially peripheral nerve root clumping in the lower portion of the thecal sac, suspicious for mild arachnoiditis.   I reviewed his CPAP compliance data from 11/21/2014 through 12/20/2014 which is a total of 30 days during which time he used his machine every night with percent used days greater than 4 hours at 100%, indicating superb compliance with an average usage of 7 hours and 16 minutes, residual AHI at 0.8 per hour, leaked low with the 95th percentile at 3.5 L/m on a pressure of 7 cm with EPR of 3.   I saw him on 07/15/2013, at which time we talked about his sleep study results as well as his compliance data. He reported having adjusted well to CPAP, but he had nasal congestion and some nasal dryness. He felt better and his wife endorsed that he slept better.   I reviewed his compliance data from 12/14/2013 through 01/12/2014 which is a total of 30 days during which time he used CPAP every night. Percent used days greater than 4 hours was 100%, indicating superb compliance. Average usage was 8 hours and 12 minutes, residual AHI low at 1.2 per hour and leak very low at 1.2 L per minute at the 95th percentile. Pressure at 7 cm with EPR of 3.     I first met him on 04/12/2013, at which time he complained of snoring and excessive daytime somnolence. I suggested he have a sleep study. He had a split-night sleep study on 05/28/2013 and went over his test results with him in detail today. His baseline sleep efficiency was reduced at 68.1% with a latency to sleep of 33 minutes and wake after sleep onset of 46.5 minutes with moderate to severe sleep fragmentation noted. His arousal index was mildly elevated. He had an increased percentage of stage I and stage II sleep, absence of slow-wave sleep, and a reduced percentage of REM sleep with a mildly prolonged REM latency. He had no significant EKG changes or periodic leg movements of sleep. He had moderate to loud snoring. He had a  total AHI of 15.2 per hour, rising to 38 per hour in REM sleep. His baseline oxygen saturation was 95% with a nadir of 78% during REM sleep. He was then titrated on CPAP. His sleep efficiency improved. His arousal index improved. His sleep architecture showed an increased percentage of stage II sleep, absence of slow-wave sleep and a reduced percentage of REM sleep. Average oxygen saturation was 96%, nadir was 92%, greatly improved. He was titrated on CPAP from 5-7 cm with a reduction of his AHI to 0 per hour on the final pressure. He had REM sleep  but no supine sleep was achieved during this part of the study. He indicated that he does not sleep on his back. He is status post multiple back operations. Based on the test results I prescribed CPAP for him.   I reviewed the patient's CPAP compliance data from 06/09/2013 to 07/08/2013, which is a total of 30 days, during which time the patient used CPAP every day except for 1 day. The average usage for all days was 7 hours and 44 minutes. The percent used days greater than 4 hours was 97 %, indicating excellent compliance. The residual AHI was 1.3 per hour, indicating an appropriate treatment pressure of 7 cwp with EPR of 3.     I reviewed his compliance data of with his machine: 06/10/2013 through 07/14/2013 which is a total of 35 days during which time use CPAP every night with a percent used days greater than 4 hours of 100%, indicating excellent compliance, average usage was 7 hours and 47 minutes, residual AHI low at 1.3 per hour and leak was low at 1.4. Pressure was 7 cm with EPR of 3.  His Past Medical History Is Significant For: Past Medical History:  Diagnosis Date  . Arthritis   . Colon polyps   . CVA (cerebral infarction) 2010  . Diabetes mellitus   . Dizziness   . DVT (deep venous thrombosis) (HCC)    bilateral  . ED (erectile dysfunction)   . Foot drop   . H/O Bell's palsy   . Hyperlipidemia   . Hypertension   . Obesity   . OSA on CPAP    . Spinal stenosis   . Stroke ([place])   . Unsteady gait     His Past Surgical History Is Significant For: Past Surgical History:  Procedure Laterality Date  . CARPAL TUNNEL RELEASE Bilateral   . CERVICAL DISCECTOMY  2004  . LUMBAR DISC SURGERY     x6  . POSTERIOR CERVICAL FUSION/FORAMINOTOMY N/A 06/27/2016   Procedure: POSTERIOR CERVICAL FUSION C2-C5; C2-C4 Decompression;  Surgeon: Melina Schools, MD;  Location: [place];  Service: Orthopedics;  Laterality: N/A;    His Family History Is Significant For: Family History  Problem Relation Age of Onset  . Multiple sclerosis Sister   . Dementia Mother   . Diabetes Mother   . Lung cancer Father   . Sleep apnea Brother   . Hypertension Brother   . Colon cancer Neg Hx   . Stomach cancer Neg Hx     His Social History Is Significant For: Social History   Socioeconomic History  . Marital status: Married    Spouse name: Nicki Reaper  . Number of children: 2  . Years of education: 72  . Highest education level: Not on file  Occupational History  . Occupation: RETIRED    Comment: retired  Scientific laboratory technician  . Financial resource strain: Not on file  . Food insecurity:    Worry: Not on file    Inability: Not on file  . Transportation needs:    Medical: Not on file    Non-medical: Not on file  Tobacco Use  . Smoking status: Current Some Day Smoker    Types: Cigarettes    Last attempt to quit: 01/21/2011    Years since quitting: 7.2  . Smokeless tobacco: Never Used  . Tobacco comment: 2-3 times weekly  Substance and Sexual Activity  . Alcohol use: No  . Drug use: No  . Sexual activity: Not on file  Lifestyle  .  Physical activity:    Days per week: Not on file    Minutes per session: Not on file  . Stress: Not on file  Relationships  . Social connections:    Talks on phone: Not on file    Gets together: Not on file    Attends religious service: Not on file    Active member of club or organization: Not on file    Attends meetings  of clubs or organizations: Not on file    Relationship status: Not on file  Other Topics Concern  . Not on file  Social History Narrative   Patient is right handed and resides with wife    His Allergies Are:  No Known Allergies:   His Current Medications Are:  Outpatient Encounter Medications as of 05/07/2018  Medication Sig  . atorvastatin (LIPITOR) 40 MG tablet Take 0.5 tablets (20 mg total) by mouth at bedtime.  . blood glucose meter kit and supplies KIT Dispense based on patient and insurance preference. Use up to four times daily as directed. (FOR ICD-9 250.00, 250.01).  Marland Kitchen clopidogrel (PLAVIX) 75 MG tablet Take 1 tablet by mouth daily.  . cyclobenzaprine (FLEXERIL) 10 MG tablet Take 1 tablet (10 mg total) by mouth 2 (two) times daily as needed for muscle spasms. (Patient taking differently: Take 10 mg by mouth every 8 (eight) hours as needed for muscle spasms. )  . metFORMIN (GLUCOPHAGE) 1000 MG tablet Take 1,000 mg by mouth 2 (two) times daily with a meal.  . metoprolol tartrate (LOPRESSOR) 25 MG tablet Take 25 mg by mouth 2 (two) times daily.  Marland Kitchen oxyCODONE-acetaminophen (PERCOCET/ROXICET) 5-325 MG tablet Take 1 tablet by mouth 3 (three) times daily as needed.  . polyethylene glycol (MIRALAX / GLYCOLAX) packet Take 17 g by mouth 2 (two) times daily.  . sildenafil (REVATIO) 20 MG tablet sildenafil (antihypertensive) 20 mg tablet  . tamsulosin (FLOMAX) 0.4 MG CAPS capsule Take 0.4 mg by mouth daily.  . valsartan-hydrochlorothiazide (DIOVAN-HCT) 160-12.5 MG tablet Take 1 tablet by mouth daily.  . [DISCONTINUED] pregabalin (LYRICA) 75 MG capsule Take 75 mg by mouth daily.   No facility-administered encounter medications on file as of 05/07/2018.   :  Review of Systems:  Out of a complete 14 point review of systems, all are reviewed and negative with the exception of these symptoms as listed below:  Review of Systems  Neurological:       Pt presents today to discuss his cpap. Pt  reports that it is going well. Pt is wondering if he qualifies for new machine.    Objective:  Neurological Exam  Physical Exam Physical Examination:   Vitals:   05/07/18 1456  BP: (!) 159/90  Pulse: 72   General Examination: The patient is a very pleasant 68 y.o. male in no acute distress. He appears well-developed and well-nourished and well groomed.   HEENT:Normocephalic, atraumatic, pupils are equal, round and reactive to light and accommodation. Extraocular tracking is good without limitation to gaze excursion or nystagmus noted. Normal smooth pursuit is noted. Corrective eyeglasses in place. Hearing is grossly intact. Face is symmetric with normal facial animation and slight decrease in sensation in the right midface. Speech is clear, no dysarthria noted and no hypophonia. There is no lip, neck/head, jaw or voice tremor. Neck with mildly decreased ROM. Oropharynx exam reveals: no significant changes.  Chest:Clear to auscultation without wheezing, rhonchi or crackles noted.  Heart:S1+S2+0, regular and normal without murmurs, rubs or gallops noted.  Abdomen:Soft, non-tender and non-distended.  Extremities:There isnoedema in the distal lower extremities bilaterally. R AFO in place for chronic R foot drop. His right leg is smaller in caliber than the L, unchanged.   Skin: Warm and dry without trophic changes noted. There are no varicose veins.  Musculoskeletal: exam reveals no obvious joint deformities, tenderness or joint swelling or erythema.   Neurologically:  Mental status: The patient is awake, alert and oriented in all 4 spheres. His memory, attention, language and knowledge are appropriate. There is no aphasia, agnosia, apraxia or anomia. Speech is clear with normal prosody and enunciation. Thought process is linear. Mood is congruent and affect is normal.  Cranial nerves are as described above under HEENT exam.  Motor exam: mild hand muscle wasting. Global  strength of 4 out of 5. Slightly weaker on the right side, stable R AFO. Romberg is not tested for safety. Fine motor skills are mildly impaired.   Cerebellar testing shows no dysmetria or intention tremor. There is no truncal or gait ataxia.  Sensory exam is intact to light touch.  Gait, station and balance: He has loss of lumbar lordosis and has a mildly stooped posture, unchanged . No veering to one side is noted. No leaning to one side is noted. Posture ismildly stooped and he uses a single point cane. Walks slowly and cautiously, all stable.  Assessment and Plan:   In summary, Akul Leggette is a very pleasant48 year old male with an underlying medical history of hypertension, smoking, hyperlipidemia, type 2 diabetes, degenerative back disease, s/p multiple back surgeris, and prior TIAs and left thalamic stroke, who presents for FU consultation of his OSA, well established on CPAP. He has a Hx ofL thalamic stroke in the past. He had neck surgery for cervical myelopathy. He has come a long way and has worked hard on rehab. He is commended for his superb CPAP compliance, has a refurbished CPAP currently, but should be eligible for a new CPAP machine by sometime next month and we will try to arrange for a new machine through his DME company at the time. He may need a 90 day follow-up after starting on new equipment. He is aware of this at this point. He is commended first treatment adherence and his endeavor to maintain healthy lifestyle and secondary stroke prevention. I suggested a one-year checkup for sleep apnea. Neurologically, he has done well. His strength and fine motor skills have improved. He went from wheelchair to walker and is now mostly able to use his cane.  I answered all their questions today and the patient and his wife were in agreement. I spent 25 minutes in total face-to-face time with the patient, more than 50% of which was spent in counseling and coordination of care, reviewing  test results, reviewing medication and discussing or reviewing the diagnosis of OSA, prior stroke, its prognosis and treatment options. Pertinent laboratory and imaging test results that were available during this visit with the patient were reviewed by me and considered in my medical decision making (see chart for details).

## 2018-05-08 ENCOUNTER — Telehealth: Payer: Self-pay

## 2018-05-08 DIAGNOSIS — Z9989 Dependence on other enabling machines and devices: Principal | ICD-10-CM

## 2018-05-08 DIAGNOSIS — G4733 Obstructive sleep apnea (adult) (pediatric): Secondary | ICD-10-CM

## 2018-05-08 NOTE — Telephone Encounter (Signed)
Received this notice from [facility]: "The pt will be eligible for a new replacement unit after 06/10/2018. If you want to go ahead and have a rx entered with pressure settings we can hold until next month to process."

## 2018-05-08 NOTE — Telephone Encounter (Signed)
Order for new cpap sent to [facility] via community message. Confirmation received that the order transmitted was successful.    

## 2018-05-08 NOTE — Telephone Encounter (Signed)
New CPAP order entered for use after 06/10/18.

## 2018-05-23 NOTE — Telephone Encounter (Signed)
I called pt to discuss. No answer, left a message asking him to call me back.  We have already sent an order to [facility] for a replacement cpap after 06/10/2018. Pt should contact [facility] to discuss further. Pt does need a 31-90 day follow up after starting cpap. If pt calls back, please explain this to him and assist him in getting scheduled in April or May.

## 2018-05-23 NOTE — Telephone Encounter (Signed)
Pt has called back and has been made aware of message from [employer]

## 2018-05-23 NOTE — Telephone Encounter (Signed)
I called pt again. No answer, left a message asking him to call me back.  When pt calls back, please go ahead and schedule him for his 31-90 day follow up in April or May. This was instructed in my previous note as well.

## 2018-05-23 NOTE — Telephone Encounter (Signed)
Pt has called so Dr Rexene Alberts can be aware that as of 06-10-2018 he is eligible for a new CPAP, pt asking to be called to discuss

## 2018-06-11 DIAGNOSIS — Z79891 Long term (current) use of opiate analgesic: Secondary | ICD-10-CM | POA: Diagnosis not present

## 2018-06-11 DIAGNOSIS — G894 Chronic pain syndrome: Secondary | ICD-10-CM | POA: Diagnosis not present

## 2018-06-11 DIAGNOSIS — M961 Postlaminectomy syndrome, not elsewhere classified: Secondary | ICD-10-CM | POA: Insufficient documentation

## 2018-06-11 DIAGNOSIS — M51369 Other intervertebral disc degeneration, lumbar region without mention of lumbar back pain or lower extremity pain: Secondary | ICD-10-CM | POA: Insufficient documentation

## 2018-06-11 DIAGNOSIS — M5136 Other intervertebral disc degeneration, lumbar region: Secondary | ICD-10-CM | POA: Insufficient documentation

## 2018-06-27 DIAGNOSIS — G4733 Obstructive sleep apnea (adult) (pediatric): Secondary | ICD-10-CM | POA: Diagnosis not present

## 2018-06-27 DIAGNOSIS — Z8673 Personal history of transient ischemic attack (TIA), and cerebral infarction without residual deficits: Secondary | ICD-10-CM | POA: Diagnosis not present

## 2018-06-27 DIAGNOSIS — I7300 Raynaud's syndrome without gangrene: Secondary | ICD-10-CM | POA: Diagnosis not present

## 2018-06-27 DIAGNOSIS — R82998 Other abnormal findings in urine: Secondary | ICD-10-CM | POA: Diagnosis not present

## 2018-06-27 DIAGNOSIS — Z6835 Body mass index (BMI) 35.0-35.9, adult: Secondary | ICD-10-CM | POA: Diagnosis not present

## 2018-06-27 DIAGNOSIS — Z0000 Encounter for general adult medical examination without abnormal findings: Secondary | ICD-10-CM | POA: Diagnosis not present

## 2018-06-27 DIAGNOSIS — Z1331 Encounter for screening for depression: Secondary | ICD-10-CM | POA: Diagnosis not present

## 2018-06-27 DIAGNOSIS — I10 Essential (primary) hypertension: Secondary | ICD-10-CM | POA: Diagnosis not present

## 2018-06-27 DIAGNOSIS — E1140 Type 2 diabetes mellitus with diabetic neuropathy, unspecified: Secondary | ICD-10-CM | POA: Diagnosis not present

## 2018-06-27 DIAGNOSIS — Z1339 Encounter for screening examination for other mental health and behavioral disorders: Secondary | ICD-10-CM | POA: Diagnosis not present

## 2018-06-27 DIAGNOSIS — E1129 Type 2 diabetes mellitus with other diabetic kidney complication: Secondary | ICD-10-CM | POA: Diagnosis not present

## 2018-06-27 DIAGNOSIS — E7849 Other hyperlipidemia: Secondary | ICD-10-CM | POA: Diagnosis not present

## 2018-06-27 DIAGNOSIS — Z125 Encounter for screening for malignant neoplasm of prostate: Secondary | ICD-10-CM | POA: Diagnosis not present

## 2018-06-27 DIAGNOSIS — J984 Other disorders of lung: Secondary | ICD-10-CM | POA: Diagnosis not present

## 2018-06-27 DIAGNOSIS — I77811 Abdominal aortic ectasia: Secondary | ICD-10-CM | POA: Diagnosis not present

## 2018-07-03 DIAGNOSIS — H40013 Open angle with borderline findings, low risk, bilateral: Secondary | ICD-10-CM | POA: Diagnosis not present

## 2018-07-12 ENCOUNTER — Encounter: Payer: Self-pay | Admitting: Podiatry

## 2018-07-12 ENCOUNTER — Ambulatory Visit: Payer: Medicare Other | Admitting: Podiatry

## 2018-07-12 ENCOUNTER — Other Ambulatory Visit: Payer: Self-pay

## 2018-07-12 ENCOUNTER — Ambulatory Visit (INDEPENDENT_AMBULATORY_CARE_PROVIDER_SITE_OTHER): Payer: Medicare Other | Admitting: Podiatry

## 2018-07-12 DIAGNOSIS — M79675 Pain in left toe(s): Secondary | ICD-10-CM | POA: Diagnosis not present

## 2018-07-12 DIAGNOSIS — B351 Tinea unguium: Secondary | ICD-10-CM

## 2018-07-12 DIAGNOSIS — E1142 Type 2 diabetes mellitus with diabetic polyneuropathy: Secondary | ICD-10-CM

## 2018-07-12 DIAGNOSIS — M79674 Pain in right toe(s): Secondary | ICD-10-CM | POA: Diagnosis not present

## 2018-07-12 DIAGNOSIS — M21371 Foot drop, right foot: Secondary | ICD-10-CM

## 2018-07-12 NOTE — Progress Notes (Signed)
Complaint:  Visit Type: Patient returns to my office for continued preventative foot care services. 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  The patient presents for preventative foot care services.  He says he was sen by Dr.  Amalia Hailey last time who trimmed his blackened callus right heel. No changes to ROS  Podiatric Exam: Vascular: dorsalis pedis and posterior tibial pulses are palpable bilateral. Capillary return is immediate. Temperature gradient is WNL. Skin turgor WNL  Sensorium: Diminished  Semmes Weinstein monofilament test. Diminished  tactile sensation bilaterally. Nail Exam: Pt has thick disfigured discolored nails with subungual debris noted bilateral entire nail hallux through fifth toenails Ulcer Exam: There is no evidence of ulcer or pre-ulcerative changes or infection. Orthopedic Exam: Muscle tone and strength are WNL. No limitations in general ROM. No crepitus or effusions noted. Foot type and digits show no abnormalities. Bony prominences are unremarkable. Skin: No Porokeratosis. No infection or ulcers.  Blackened heel callus medial aspect right heel.  Diagnosis:  Onychomycosis, , Pain in right toe, pain in left toes  Heel Callus right foot  Treatment & Plan Procedures and Treatment: Consent by patient was obtained for treatment procedures.   Debridement of mycotic and hypertrophic toenails, 1 through 5 bilateral and clearing of subungual debris. No ulceration, no infection noted. Debride callus right heel. Return Visit-Office Procedure: Patient instructed to return to the office for a follow up visit 3 months for continued evaluation and treatment.    Gardiner Barefoot DPM

## 2018-10-15 ENCOUNTER — Ambulatory Visit: Payer: Medicare Other | Admitting: Podiatry

## 2018-10-25 ENCOUNTER — Encounter: Payer: Self-pay | Admitting: Podiatry

## 2018-10-25 ENCOUNTER — Ambulatory Visit (INDEPENDENT_AMBULATORY_CARE_PROVIDER_SITE_OTHER): Payer: Medicare Other | Admitting: Podiatry

## 2018-10-25 ENCOUNTER — Other Ambulatory Visit: Payer: Self-pay

## 2018-10-25 DIAGNOSIS — M79675 Pain in left toe(s): Secondary | ICD-10-CM

## 2018-10-25 DIAGNOSIS — B351 Tinea unguium: Secondary | ICD-10-CM

## 2018-10-25 DIAGNOSIS — M79674 Pain in right toe(s): Secondary | ICD-10-CM | POA: Diagnosis not present

## 2018-10-25 NOTE — Progress Notes (Signed)
Complaint:  Visit Type: Patient returns to my office for continued preventative foot care services. 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  The patient presents for preventative foot care services.   No changes to ROS  Podiatric Exam: Vascular: dorsalis pedis and posterior tibial pulses are palpable bilateral. Capillary return is immediate. Temperature gradient is WNL. Skin turgor WNL  Sensorium: Diminished  Semmes Weinstein monofilament test. Diminished  tactile sensation bilaterally. Nail Exam: Pt has thick disfigured discolored nails with subungual debris noted bilateral entire nail hallux through fifth toenails Ulcer Exam: There is no evidence of ulcer or pre-ulcerative changes or infection. Orthopedic Exam: Muscle tone and strength are WNL. No limitations in general ROM. No crepitus or effusions noted. Foot type and digits show no abnormalities. Bony prominences are unremarkable. Skin: No Porokeratosis. No infection or ulcers.  Asymptomatic heel callus.  Healing callus over first metatarsal right foot.  Diagnosis:  Onychomycosis, , Pain in right toe, pain in left toes    Treatment & Plan Procedures and Treatment: Consent by patient was obtained for treatment procedures.   Debridement of mycotic and hypertrophic toenails, 1 through 5 bilateral and clearing of subungual debris. No ulceration, no infection noted.  Return Visit-Office Procedure: Patient instructed to return to the office for a follow up visit 3 months for continued evaluation and treatment.    Gardiner Barefoot DPM

## 2018-10-29 DIAGNOSIS — Z79891 Long term (current) use of opiate analgesic: Secondary | ICD-10-CM | POA: Diagnosis not present

## 2018-11-15 ENCOUNTER — Encounter: Payer: Self-pay | Admitting: General Surgery

## 2018-11-15 NOTE — Progress Notes (Signed)
Cc: patient having a problem with stomach burning, need to schedule colonoscopy

## 2018-11-19 ENCOUNTER — Ambulatory Visit (INDEPENDENT_AMBULATORY_CARE_PROVIDER_SITE_OTHER): Payer: Medicare Other | Admitting: Internal Medicine

## 2018-11-19 ENCOUNTER — Encounter: Payer: Self-pay | Admitting: Internal Medicine

## 2018-11-19 VITALS — Ht 75.0 in | Wt 279.0 lb

## 2018-11-19 DIAGNOSIS — Z7902 Long term (current) use of antithrombotics/antiplatelets: Secondary | ICD-10-CM | POA: Diagnosis not present

## 2018-11-19 DIAGNOSIS — Z86010 Personal history of colonic polyps: Secondary | ICD-10-CM

## 2018-11-19 DIAGNOSIS — K219 Gastro-esophageal reflux disease without esophagitis: Secondary | ICD-10-CM | POA: Diagnosis not present

## 2018-11-19 DIAGNOSIS — E119 Type 2 diabetes mellitus without complications: Secondary | ICD-10-CM

## 2018-11-19 MED ORDER — OMEPRAZOLE 20 MG PO CPDR: 20.0000 mg | DELAYED_RELEASE_CAPSULE | Freq: Every day | ORAL | 11 refills | Status: DC

## 2018-11-19 MED ORDER — PLENVU 140 G PO SOLR: 1.0000 | ORAL | 0 refills | Status: DC

## 2018-11-19 NOTE — Addendum Note (Signed)
Addended byDebbe Mounts on: 11/19/2018 03:55 PM   Modules accepted: Orders

## 2018-11-19 NOTE — Addendum Note (Signed)
Addended byDebbe Mounts on: 11/19/2018 03:53 PM   Modules accepted: Orders

## 2018-11-19 NOTE — Patient Instructions (Addendum)
You have been scheduled for a colonoscopy. Please follow written instructions given to you at your visit today.  Please pick up your prep supplies at the pharmacy within the next 1-3 days. If you use inhalers (even only as needed), please bring them with you on the day of your procedure.  You have been instructed to stay on your Plavix as per your visit with Dr. Henrene Pastor. Pros, Cons and limitation of the strategy were discussed with you by Dr. Henrene Pastor.   You will need to hold your metformin morning of your procedure.   We have sent the following medications to your pharmacy for you to pick up at your convenience: Omeprazole   If you are age 68 or older, your body mass index should be between 23-30. Your Body mass index is 34.87 kg/m. If this is out of the aforementioned range listed, please consider follow up with your Primary Care Provider.  Thank you for choosing me and [facility].  Dr. Scarlette Shorts

## 2018-11-19 NOTE — Progress Notes (Signed)
HISTORY OF PRESENT ILLNESS:  Rodney Crane is a 68 y.o. male, [place] native and [place] fan, with multiple medical problems who presents today via telehealth medicine during the coronavirus pandemic regarding problems with dyspepsia and the need for surveillance colonoscopy.  Patient has a history of multiple adenomatous colon polyps.  Previous colonoscopies in [place] and most recently here January 2013.  He was seen in this office March 24, 2017 regarding surveillance colonoscopy due to interval medical problems as outlined that day, the procedure was postponed.  He is on chronic Plavix therapy for cerebrovascular disease.  He did well with regards to his rehabilitation and now ambulates with a cane.  He is ready to schedule his surveillance colonoscopy.  He saw Dr. Brigitte Pulse June 28, 2018 regarding cold hands and feet.  I have reviewed that office note.  As well, outside blood work from March 2029 hemoglobin A1c 6.8.  Unremarkable CBC with hemoglobin 12.7.  Patient's GI review of systems is remarkable for chronic constipation for which he takes MiraLAX daily.  He also has active reflux symptoms as manifested by burning for which he takes antacids.  No dysphasia.  Review of x-ray file shows no relevant interval GI abnormalities  REVIEW OF SYSTEMS:  All non-GI ROS negative unless otherwise stated in the HPI except for arthritis  Past Medical History:  Diagnosis Date  . Abdominal aortic ectasia ([place])   . Arthritis   . Cervical spondylosis   . Colon polyps   . CVA (cerebral infarction) 2010  . Diabetes mellitus   . Dizziness   . DVT (deep venous thrombosis) ([facility])    bilateral  . ED (erectile dysfunction)   . Foot drop   . H/O Bell's palsy   . Hyperlipidemia   . Hypertension   . Obesity   . OSA on CPAP   . Raynauds syndrome   . Restrictive lung disease   . Spinal stenosis   . Stroke ([place])   . Unsteady gait     Past Surgical History:  Procedure Laterality Date  . CARPAL TUNNEL RELEASE  Bilateral   . CERVICAL DISCECTOMY  2004  . LUMBAR DISC SURGERY     x6  . POSTERIOR CERVICAL FUSION/FORAMINOTOMY N/A 06/27/2016   Procedure: POSTERIOR CERVICAL FUSION C2-C5; C2-C4 Decompression;  Surgeon: Melina Schools, MD;  Location: [facility];  Service: Orthopedics;  Laterality: N/A;    Social History Rodney Crane  reports that he quit smoking about 7 years ago. His smoking use included cigarettes. He has never used smokeless tobacco. He reports that he does not drink alcohol or use drugs.  family history includes Dementia in his mother; Diabetes in his mother; Hypertension in his brother; Lung cancer in his father; Multiple sclerosis in his sister; Sleep apnea in his brother.  No Known Allergies     PHYSICAL EXAMINATION: No physical exam with telehealth medicine visit   ASSESSMENT:  1.  GERD.  Active symptoms requiring an acid therapy.  No alarm features 2.  History of multiple adenomatous colon polyps.  Due for surveillance.  Last examination 2013 3.  Multiple significant medical problems including diabetes mellitus on oral agents and cerebrovascular disease on Plavix.  PLAN:  1.  Reflux precautions 2.  PRESCRIBE OMEPRAZOLE 20 mg daily; #30; 11 refills.  This will treat his active GERD symptoms and provide prophylaxis against upper GI bleeding in a patient on chronic Plavix therapy greater than age 51 with multiple comorbidities 3.  SCHEDULE colonoscopy in [place] for polyp surveillance.  The patient is HIGH RISK given his comorbidities and the need to address his antiplatelet therapy and diabetes therapy.The nature of the procedure, as well as the risks, benefits, and alternatives were carefully and thoroughly reviewed with the patient. Ample time for discussion and questions allowed. The patient understood, was satisfied, and agreed to proceed. 4.  HOLD diabetic medications (metformin) the day of the examination to avoid hypoglycemia 5.  Patient has been instructed to [employer]  throughout.  We discussed the pros, cons, and limitations of the strategy  This telehealth medicine audio only visit was initiated by consented for by the patient who was in his home while I was in my office during the encounter.  He understands her may be associated professional charge for this service which totaled 25 minutes

## 2018-11-26 ENCOUNTER — Telehealth: Payer: Self-pay | Admitting: Internal Medicine

## 2018-11-26 NOTE — Telephone Encounter (Signed)

## 2018-11-26 NOTE — Telephone Encounter (Signed)
No to all answer °

## 2018-11-27 ENCOUNTER — Encounter: Payer: Self-pay | Admitting: Internal Medicine

## 2018-11-27 ENCOUNTER — Other Ambulatory Visit: Payer: Self-pay

## 2018-11-27 ENCOUNTER — Ambulatory Visit (AMBULATORY_SURGERY_CENTER): Payer: Medicare Other | Admitting: Internal Medicine

## 2018-11-27 VITALS — BP 111/68 | HR 74 | Temp 96.4°F | Resp 18 | Ht 75.0 in | Wt 279.0 lb

## 2018-11-27 DIAGNOSIS — D123 Benign neoplasm of transverse colon: Secondary | ICD-10-CM | POA: Diagnosis not present

## 2018-11-27 DIAGNOSIS — D124 Benign neoplasm of descending colon: Secondary | ICD-10-CM

## 2018-11-27 DIAGNOSIS — D122 Benign neoplasm of ascending colon: Secondary | ICD-10-CM | POA: Diagnosis not present

## 2018-11-27 DIAGNOSIS — Z86010 Personal history of colonic polyps: Secondary | ICD-10-CM

## 2018-11-27 MED ORDER — SODIUM CHLORIDE 0.9 % IV SOLN: 500.0000 mL | Freq: Once | INTRAVENOUS | Status: DC

## 2018-11-27 NOTE — Progress Notes (Signed)
Called to room to assist during endoscopic procedure.  Patient ID and intended procedure confirmed with present staff. Received instructions for my participation in the procedure from the performing physician.  

## 2018-11-27 NOTE — Progress Notes (Signed)
Temperature taken by S.S., CMA, VS taken by Riki Sheer, LPN

## 2018-11-27 NOTE — Progress Notes (Signed)
PT taken to PACU. Monitors in place. VSS. Report given to RN. 

## 2018-11-27 NOTE — Progress Notes (Signed)
Pt's states no medical or surgical changes since previsit or office visit. 

## 2018-11-27 NOTE — Op Note (Addendum)
[name] Name: Rodney Crane Procedure Date: 11/27/2018 3:20 PM MRN: [id number] Endoscopist: Docia Chuck. Henrene Pastor , MD Age: 68 Referring MD:  Date of Birth: 03-08-1951 Gender: Male Account #: [id number] Procedure:                Colonoscopy with cold snare polypectomy x 5 Indications:              High risk colon cancer surveillance: Personal                            history of multiple (3 or more) adenomas. Previous                            examinations 2006 ([place]); 2013 (here) Medicines:                Monitored Anesthesia Care Procedure:                Pre-Anesthesia Assessment:                           - Prior to the procedure, a History and Physical                            was performed, and patient medications and                            allergies were reviewed. The patient's tolerance of                            previous anesthesia was also reviewed. The risks                            and benefits of the procedure and the sedation                            options and risks were discussed with the patient.                            All questions were answered, and informed consent                            was obtained. Prior Anticoagulants: The patient has                            taken Plavix (clopidogrel), last dose was day of                            procedure. ASA Grade Assessment: III - A patient                            with severe systemic disease. After reviewing the                            risks and benefits, the patient was deemed in  satisfactory condition to undergo the procedure.                           After obtaining informed consent, the colonoscope                            was passed under direct vision. Throughout the                            procedure, the patient's blood pressure, pulse, and                            oxygen saturations were monitored continuously. The                             Colonoscope was introduced through the anus and                            advanced to the the cecum, identified by                            appendiceal orifice and ileocecal valve. The                            ileocecal valve, appendiceal orifice, and rectum                            were photographed. The quality of the bowel                            preparation was excellent. The colonoscopy was                            performed without difficulty. The patient tolerated                            the procedure well. The bowel preparation used was                            SUPREP via split dose instruction. Scope In: 3:30:16 PM Scope Out: 3:52:28 PM Scope Withdrawal Time: 0 hours 17 minutes 20 seconds  Total Procedure Duration: 0 hours 22 minutes 12 seconds  Findings:                 Five polyps were found in the descending colon,                            transverse colon and ascending colon. The polyps                            were 2 to 7 mm in size. These polyps were removed                            with a cold snare. Resection and retrieval  were                            complete.                           Internal hemorrhoids were found during retroflexion.                           The exam was otherwise without abnormality on                            direct and retroflexion views. Complications:            No immediate complications. Estimated blood loss:                            None. Estimated Blood Loss:     Estimated blood loss: none. Impression:               - Five 2 to 7 mm polyps in the descending colon, in                            the transverse colon and in the ascending colon,                            removed with a cold snare. Resected and retrieved.                           - Internal hemorrhoids.                           - The examination was otherwise normal on direct                            and retroflexion  views. Recommendation:           - Repeat colonoscopy in 5 years.                           - Patient has a contact number available for                            emergencies. The signs and symptoms of potential                            delayed complications were discussed with the                            patient. Return to normal activities tomorrow.                            Written discharge instructions were provided to the                            patient.                           -  Resume previous diet.                           - Continue present medications INCLUDING PLAVIX.                           - Await pathology results. Docia Chuck. Henrene Pastor, MD 11/27/2018 4:00:22 PM This report has been signed electronically.

## 2018-11-27 NOTE — Patient Instructions (Addendum)
Thank you for allowing [place] to care for you today!  Await pathology results by mail, approximately 2 weeks.  Recommend next colonoscopy in 5 years because exam was incomplete  Resume previous diet and medications today, Including Plavix.  Return to your normal activities tomorrow.      YOU HAD AN ENDOSCOPIC PROCEDURE TODAY AT [facility]:   Refer to the procedure report that was given to you for any specific questions about what was found during the examination.  If the procedure report does not answer your questions, please call your gastroenterologist to clarify.  If you requested that your care partner not be given the details of your procedure findings, then the procedure report has been included in a sealed envelope for you to review at your convenience later.  YOU SHOULD EXPECT: Some feelings of bloating in the abdomen. Passage of more gas than usual.  Walking can help get rid of the air that was put into your GI tract during the procedure and reduce the bloating. If you had a lower endoscopy (such as a colonoscopy or flexible sigmoidoscopy) you may notice spotting of blood in your stool or on the toilet paper. If you underwent a bowel prep for your procedure, you may not have a normal bowel movement for a few days.  Please Note:  You might notice some irritation and congestion in your nose or some drainage.  This is from the oxygen used during your procedure.  There is no need for concern and it should clear up in a day or so.  SYMPTOMS TO REPORT IMMEDIATELY:   Following lower endoscopy (colonoscopy or flexible sigmoidoscopy):  Excessive amounts of blood in the stool  Significant tenderness or worsening of abdominal pains  Swelling of the abdomen that is new, acute  Fever of 100F or higher   For urgent or emergent issues, a gastroenterologist can be reached at any hour by calling [phone].   DIET:  We do recommend a small meal at first, but then you may  proceed to your regular diet.  Drink plenty of fluids but you should avoid alcoholic beverages for 24 hours.  ACTIVITY:  You should plan to take it easy for the rest of today and you should NOT DRIVE or use heavy machinery until tomorrow (because of the sedation medicines used during the test).    FOLLOW UP: Our staff will call the number listed on your records 48-72 hours following your procedure to check on you and address any questions or concerns that you may have regarding the information given to you following your procedure. If we do not reach you, we will leave a message.  We will attempt to reach you two times.  During this call, we will ask if you have developed any symptoms of COVID 19. If you develop any symptoms (ie: fever, flu-like symptoms, shortness of breath, cough etc.) before then, please call [phone].  If you test positive for Covid 19 in the 2 weeks post procedure, please call and report this information to [place].    If any biopsies were taken you will be contacted by phone or by letter within the next 1-3 weeks.  Please call us at [phone] if you have not heard about the biopsies in 3 weeks.    SIGNATURES/CONFIDENTIALITY: You and/or your care partner have signed paperwork which will be entered into your electronic medical record.  These signatures attest to the fact that that the information above on your After  Visit Summary has been reviewed and is understood.  Full responsibility of the confidentiality of this discharge information lies with you and/or your care-partner. 

## 2018-11-29 ENCOUNTER — Telehealth: Payer: Self-pay | Admitting: *Deleted

## 2018-11-29 NOTE — Telephone Encounter (Signed)
  Follow up Call-  Call back number 11/27/2018  Post procedure Call Back phone  # [phone]  Permission to leave phone message Yes  Some recent data might be hidden     Patient questions:  Do you have a fever, pain , or abdominal swelling? No. Pain Score  0 *  Have you tolerated food without any problems? Yes.    Have you been able to return to your normal activities? Yes.    Do you have any questions about your discharge instructions: Diet   No. Medications  No. Follow up visit  No.  Do you have questions or concerns about your Care? No.  Actions: * If pain score is 4 or above: No action needed, pain <4.  1. Have you developed a fever since your procedure? no  2.   Have you had an respiratory symptoms (SOB or cough) since your procedure? no  3.   Have you tested positive for COVID 19 since your procedure no  4.   Have you had any family members/close contacts diagnosed with the COVID 19 since your procedure?  no   If yes to any of these questions please route to Joylene John, RN and Alphonsa Gin, Therapist, sports.

## 2018-12-05 ENCOUNTER — Encounter: Payer: Self-pay | Admitting: Internal Medicine

## 2018-12-13 DIAGNOSIS — M5136 Other intervertebral disc degeneration, lumbar region: Secondary | ICD-10-CM | POA: Diagnosis not present

## 2019-01-02 DIAGNOSIS — Z23 Encounter for immunization: Secondary | ICD-10-CM | POA: Diagnosis not present

## 2019-01-03 DIAGNOSIS — R42 Dizziness and giddiness: Secondary | ICD-10-CM | POA: Diagnosis not present

## 2019-01-03 DIAGNOSIS — I10 Essential (primary) hypertension: Secondary | ICD-10-CM | POA: Diagnosis not present

## 2019-01-03 DIAGNOSIS — E1129 Type 2 diabetes mellitus with other diabetic kidney complication: Secondary | ICD-10-CM | POA: Diagnosis not present

## 2019-01-03 DIAGNOSIS — E1140 Type 2 diabetes mellitus with diabetic neuropathy, unspecified: Secondary | ICD-10-CM | POA: Diagnosis not present

## 2019-01-03 DIAGNOSIS — E785 Hyperlipidemia, unspecified: Secondary | ICD-10-CM | POA: Diagnosis not present

## 2019-01-08 DIAGNOSIS — H40013 Open angle with borderline findings, low risk, bilateral: Secondary | ICD-10-CM | POA: Diagnosis not present

## 2019-01-08 DIAGNOSIS — E7849 Other hyperlipidemia: Secondary | ICD-10-CM | POA: Diagnosis not present

## 2019-01-08 DIAGNOSIS — H04123 Dry eye syndrome of bilateral lacrimal glands: Secondary | ICD-10-CM | POA: Diagnosis not present

## 2019-01-08 DIAGNOSIS — H2513 Age-related nuclear cataract, bilateral: Secondary | ICD-10-CM | POA: Diagnosis not present

## 2019-01-08 DIAGNOSIS — E119 Type 2 diabetes mellitus without complications: Secondary | ICD-10-CM | POA: Diagnosis not present

## 2019-01-08 DIAGNOSIS — E1140 Type 2 diabetes mellitus with diabetic neuropathy, unspecified: Secondary | ICD-10-CM | POA: Diagnosis not present

## 2019-01-24 ENCOUNTER — Encounter: Payer: Self-pay | Admitting: Podiatry

## 2019-01-24 ENCOUNTER — Ambulatory Visit (INDEPENDENT_AMBULATORY_CARE_PROVIDER_SITE_OTHER): Payer: Medicare Other | Admitting: Podiatry

## 2019-01-24 ENCOUNTER — Other Ambulatory Visit: Payer: Self-pay

## 2019-01-24 DIAGNOSIS — M79675 Pain in left toe(s): Secondary | ICD-10-CM | POA: Diagnosis not present

## 2019-01-24 DIAGNOSIS — B351 Tinea unguium: Secondary | ICD-10-CM

## 2019-01-24 DIAGNOSIS — M21371 Foot drop, right foot: Secondary | ICD-10-CM

## 2019-01-24 DIAGNOSIS — M79674 Pain in right toe(s): Secondary | ICD-10-CM

## 2019-01-24 DIAGNOSIS — E1142 Type 2 diabetes mellitus with diabetic polyneuropathy: Secondary | ICD-10-CM

## 2019-01-24 NOTE — Progress Notes (Signed)
Complaint:  Visit Type: Patient returns to my office for continued preventative foot care services. 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  The patient presents for preventative foot care services.   No changes to ROS  Podiatric Exam: Vascular: dorsalis pedis and posterior tibial pulses are palpable bilateral. Capillary return is immediate. Temperature gradient is WNL. Skin turgor WNL  Sensorium: Diminished  Semmes Weinstein monofilament test. Diminished  tactile sensation bilaterally. Nail Exam: Pt has thick disfigured discolored nails with subungual debris noted bilateral entire nail hallux through fifth toenails Ulcer Exam: There is no evidence of ulcer or pre-ulcerative changes or infection. Orthopedic Exam: Muscle tone and strength are WNL. No limitations in general ROM. No crepitus or effusions noted. Foot type and digits show no abnormalities. Bony prominences are unremarkable. Skin: No Porokeratosis. No infection or ulcers.  Asymptomatic heel callus.  Diagnosis:  Onychomycosis, , Pain in right toe, pain in left toes    Treatment & Plan Procedures and Treatment: Consent by patient was obtained for treatment procedures.   Debridement of mycotic and hypertrophic toenails, 1 through 5 bilateral and clearing of subungual debris. No ulceration, no infection noted.  Return Visit-Office Procedure: Patient instructed to return to the office for a follow up visit 3 months for continued evaluation and treatment.    Gardiner Barefoot DPM

## 2019-02-13 ENCOUNTER — Other Ambulatory Visit: Payer: Medicare Other | Admitting: Orthotics

## 2019-04-23 DIAGNOSIS — Z5181 Encounter for therapeutic drug level monitoring: Secondary | ICD-10-CM | POA: Diagnosis not present

## 2019-04-23 DIAGNOSIS — Z79899 Other long term (current) drug therapy: Secondary | ICD-10-CM | POA: Diagnosis not present

## 2019-05-02 ENCOUNTER — Ambulatory Visit (INDEPENDENT_AMBULATORY_CARE_PROVIDER_SITE_OTHER): Payer: Medicare Other | Admitting: Podiatry

## 2019-05-02 ENCOUNTER — Other Ambulatory Visit: Payer: Self-pay

## 2019-05-02 ENCOUNTER — Encounter: Payer: Self-pay | Admitting: Podiatry

## 2019-05-02 DIAGNOSIS — B351 Tinea unguium: Secondary | ICD-10-CM | POA: Diagnosis not present

## 2019-05-02 DIAGNOSIS — M21371 Foot drop, right foot: Secondary | ICD-10-CM

## 2019-05-02 DIAGNOSIS — M79674 Pain in right toe(s): Secondary | ICD-10-CM | POA: Diagnosis not present

## 2019-05-02 DIAGNOSIS — M79675 Pain in left toe(s): Secondary | ICD-10-CM | POA: Diagnosis not present

## 2019-05-02 DIAGNOSIS — E1142 Type 2 diabetes mellitus with diabetic polyneuropathy: Secondary | ICD-10-CM

## 2019-05-02 NOTE — Progress Notes (Signed)
Complaint:  Visit Type: Patient returns to my office for continued preventative foot care services. 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  The patient presents for preventative foot care services.   No changes to ROS  Podiatric Exam: Vascular: dorsalis pedis and posterior tibial pulses are palpable bilateral. Capillary return is immediate. Temperature gradient is WNL. Skin turgor WNL  Sensorium: Diminished  Semmes Weinstein monofilament test. Diminished  tactile sensation bilaterally. Nail Exam: Pt has thick disfigured discolored nails with subungual debris noted bilateral entire nail hallux through fifth toenails Ulcer Exam: There is no evidence of ulcer or pre-ulcerative changes or infection. Orthopedic Exam: Muscle tone and strength are WNL. No limitations in general ROM. No crepitus or effusions noted. Foot type and digits show no abnormalities. Bony prominences are unremarkable. Skin: No Porokeratosis. No infection or ulcers.  Asymptomatic heel callus.  Diagnosis:  Onychomycosis, , Pain in right toe, pain in left toes    Treatment & Plan Procedures and Treatment: Consent by patient was obtained for treatment procedures.   Debridement of mycotic and hypertrophic toenails, 1 through 5 bilateral and clearing of subungual debris. No ulceration, no infection noted.  Return Visit-Office Procedure: Patient instructed to return to the office for a follow up visit 3 months for continued evaluation and treatment.    Gardiner Barefoot DPM

## 2019-05-09 ENCOUNTER — Encounter: Payer: Self-pay | Admitting: Neurology

## 2019-05-09 ENCOUNTER — Ambulatory Visit (INDEPENDENT_AMBULATORY_CARE_PROVIDER_SITE_OTHER): Payer: Medicare Other | Admitting: Neurology

## 2019-05-09 ENCOUNTER — Other Ambulatory Visit: Payer: Self-pay

## 2019-05-09 VITALS — BP 156/99 | HR 95 | Temp 97.3°F | Ht 75.0 in | Wt 267.0 lb

## 2019-05-09 DIAGNOSIS — G4733 Obstructive sleep apnea (adult) (pediatric): Secondary | ICD-10-CM

## 2019-05-09 DIAGNOSIS — Z9989 Dependence on other enabling machines and devices: Secondary | ICD-10-CM

## 2019-05-09 DIAGNOSIS — Z8673 Personal history of transient ischemic attack (TIA), and cerebral infarction without residual deficits: Secondary | ICD-10-CM | POA: Diagnosis not present

## 2019-05-09 DIAGNOSIS — M21371 Foot drop, right foot: Secondary | ICD-10-CM | POA: Diagnosis not present

## 2019-05-09 NOTE — Patient Instructions (Addendum)
Please continue using your CPAP regularly. While your insurance requires that you use CPAP at least 4 hours each night on 70% of the nights, I recommend, that you not skip any nights and use it throughout the night if you can. Getting used to CPAP and staying with the treatment long term does take time and patience and discipline. Untreated obstructive sleep apnea when it is moderate to severe can have an adverse impact on cardiovascular health and raise her risk for heart disease, arrhythmias, hypertension, congestive heart failure, stroke and diabetes. Untreated obstructive sleep apnea causes sleep disruption, nonrestorative sleep, and sleep deprivation. This can have an impact on your day to day functioning and cause daytime sleepiness and impairment of cognitive function, memory loss, mood disturbance, and problems focussing. Using CPAP regularly can improve these symptoms.  Keep up the good work! We can see you in 1 year.

## 2019-05-09 NOTE — Progress Notes (Signed)
Subjective:    Patient ID: Rodney Crane is a 69 y.o. male.  HPI     Interim history:  Rodney Crane is a 69 year old right-handed gentleman with an underlying medical history of hypertension, smoking, hyperlipidemia, type 2 diabetes, degenerative back disease, s/p 7 back surgeries, and stroke (left thalamic on MRI), who presents for follow-up consultation of his sleep apnea. The patient is unaccompanied today. I last saw him on 05/07/2018, at which time he was compliant with his CPAP.  I wrote for a new machine.  He felt stable from the neurological and stroke standpoint.  He was using a cane, he was still using an AFO on the right.   Today, 05/09/2019: I reviewed his CPAP compliance data from 04/08/2019 through 05/07/2019 which is a total of 30 days, during which time he used his machine every night with percent use days greater than 4 hours at 100%, indicating excellent compliance with an average usage of 8 hours and 20 minutes, residual AHI at goal at 0.9/h, leak acceptable with a 95th percentile at 10.2 L/min on a pressure of 7 cm with EPR of 2.  Set up date was July of last year. He reports doing rather well with his new machine, he has adapted well to the transition, uses nasal pillows, sometimes the headgear tends to wear out quicker than he can replace it by insurance coverage.  His DME company is [employer] and keeps him generally up-to-date with his supplies.  He has had no new medical issues and is planning to take the Covid vaccine.  His brother and brother's family got sick with the Covid vaccine in [place], patient's wife and family have been okay thankfully.  He continues to use a brace on the right ankle, sometimes his actual AFO but it is often uncomfortable by the end of the day. He has had no falls, no neurologic symptoms thankfully.   The patient's allergies, current medications, family history, past medical history, past social history, past surgical history and problem list were  reviewed and updated as appropriate.    Previously (copied from previous notes for reference):    I saw him on 05/01/2017, at which time he was compliant with his CPAP. I increased his pressure at the time because of residual sleep-disordered breathing noted. From the neurological standpoint he was stable. He was advised to follow-up in one year.   I reviewed his CPAP compliance data from 04/08/2018 through 05/07/2018 which is a total of 30 days, during which time he used his machine 29 days with percent used days greater than 4 hours at 97%, indicating excellent compliance with an average usage of 8 hours and 20 minutes, residual AHI borderline at 6.6 per hour, leak acceptable with the 95th percentile at 8.1 L/m on a pressure of 7 cm with EPR of 3.     Of note, he presented to the emergency room on 08/28/2016 with right thigh pain and swelling. He had no neurological new focal findings. He has a chronic foot drop. He had an interim hospital admission for central cord syndrome and had cervical spine surgery in March 2018. He had an EMG and nerve conduction test in February 2018 which I reviewed: Conclusion: There is electrodiagnostic evidence of chronic length-dependent sensorimotor axonal neuropathy. No suggestion of myopathy/myositis. 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 The Tibialis Anterior showed acute/ongoing denervation which could be due to radiculopathy or neuropathy. I would have expected more demyelinating-rage  changes by now on NCV if the etiology of his weakness were due to demyelinating disease (such as Acute Inflammatory Demyelinating Polyneuropathy). Given history of worsening weakness after fall and neck injury, likely severe cervical stenosis as cause for his symptoms.      I saw him on 04/27/2016, at which time he reported difficulty using his CPAP because of flare up of his back condition. He has had multiple back  surgeries and was not able to sleep in his bed. He was using his pain medication sparingly. I encouraged him to try to use CPAP as much is possible. He had no new strokelike symptoms in the interim.   I reviewed his CPAP compliance data from 04/01/2017 through 04/30/2017 which is a total of 30 days, during which time he used his machine every night with percent used days greater than 4 hours at 100%, indicating superb compliance. Average usage of 8 hours and 5 minutes, which is great, residual AHI suboptimal at 10.4 per hour, mostly secondary to obstructive events, leak on the lower end with the 95th percentile at 7.6 L/m on a pressure of 7 cm with EPR of 3.  I saw him on 04/23/2015, at which time he reported doing well.he had a recent MRI with contrast on 01/01/15. He had seen Dr. Einar Gip in cardiology and a stress test, which was fine, per patient. His brain MRI w contrast did show evidence of an evolving L thalamic infarct. I had suggested a cardiology referral because of chest pains reported.    I reviewed his CPAP compliance data from 03/20/2016 through 04/18/2016, which is a total of 30 days, during which time he used his machine only 8 days. His last 90 day compliance showed full compliance until 03/25/2016.   I saw him on 12/22/2014 at which time he reported a recent history of left facial numbness and tingling and subjective feeling of tongue swelling. On examination he had decreased sensation of his left midface and very slight right grip strength weakness. On MRI brain he had evidence of a recent left thalamic stroke. We talked about stroke prevention, he was advised to continue with Plavix and I suggested a repeat brain MRI with contrast as well as a stroke consult with Dr. Erlinda Hong. I also suggested a cardiology referral due to chest pains reported.   I reviewed his CPAP compliance data from 03/22/15 through 04/20/15, which is a total of 30 days during which time the patient used CPAP every night, with  percent used days greater than 4 hours at 100%, indicating superb compliance with an average usage of 7 hours and 6 minutes, residual AHI low at 1 per hour, leak low with the 95th percentile at 5.5 L/m on a pressure of 7 cm with EPR of 3.   I saw him on 01/15/2014 for sleep apnea follow-up at which time the patient reported doing well with CPAP and he was using it and was fully compliant with treatment. He reported that he had residual back pain and may need a spinal cord stimulator. He reported a 20 pound weight loss overall. I suggested a one-year follow-up.   He had a brain MRI without contrast on 12/04/2014: Punctate subacute infarction in the left thalamus. Mild leukoarosis, unchanged. This is most likely due to chronic microvascular disease. Increased signal intensity in the pons that may represent a demyelinating process versus chronic microvascular disease, unchanged. He had a carotid Doppler study at [facility] on 12/10/2014 with impression: Minimal bilateral atherosclerosis without  flow limiting stenosis, less than 50% stenosis bilaterally.   He reports that he started having some symptoms about a month ago. He started having numbness and tingling in his left face and his tongue felt swollen. He had changes in his taste and report some loss of appetite. He has lost weight. He had some grip strength weakness on the right which his wife noted that he tried to downplay it at the time. She became really concerned that she also noted slurring of speech intermittently, especially when he was tired and fatigued. He reports significant fatigue for the past month or 2. He has a history of foot drop and low back pain and these symptoms seem unchanged. Since his MRI earlier this month he was told to stop his baby aspirin and start Plavix. He has been taking Plavix regularly since then. He does admit that he was not always fully compliant with his baby aspirin. He had some intermittent chest  pain. He did not bring this up necessarily at his doctor's appointment. He tries to work out regularly. He has been exercising at the gym 3 to even 4 times a week. He has not seen a cardiologist in years. He does admit to smoking some cigarettes. His wife provides additional information. She states that he tries to drink water and avoid sodas. He has improved in that regard. He still may not drink enough water. He is fully compliant with CPAP therapy. He had recent supplies delivered.   He had an MRI lumbar spine with and without contrast through Dr. Susa Day on 10/18/2006 (Indication: Left-sided back pain radiating to both legs. Weakness and numbness on the left side, ongoing for 60 months. History per lumbar surgery): 1. Multilevel central, foraminal, and subarticular lateral recess stenoses as detailed above, with the most striking findings at the L3-L4 level. 2. There is a suggestion of dural enhancement and potentially peripheral nerve root clumping in the lower portion of the thecal sac, suspicious for mild arachnoiditis.   I reviewed his CPAP compliance data from 11/21/2014 through 12/20/2014 which is a total of 30 days during which time he used his machine every night with percent used days greater than 4 hours at 100%, indicating superb compliance with an average usage of 7 hours and 16 minutes, residual AHI at 0.8 per hour, leaked low with the 95th percentile at 3.5 L/m on a pressure of 7 cm with EPR of 3.   I saw him on 07/15/2013, at which time we talked about his sleep study results as well as his compliance data. He reported having adjusted well to CPAP, but he had nasal congestion and some nasal dryness. He felt better and his wife endorsed that he slept better.   I reviewed his compliance data from 12/14/2013 through 01/12/2014 which is a total of 30 days during which time he used CPAP every night. Percent used days greater than 4 hours was 100%, indicating superb compliance.  Average usage was 8 hours and 12 minutes, residual AHI low at 1.2 per hour and leak very low at 1.2 L per minute at the 95th percentile. Pressure at 7 cm with EPR of 3.     I first met him on 04/12/2013, at which time he complained of snoring and excessive daytime somnolence. I suggested he have a sleep study. He had a split-night sleep study on 05/28/2013 and went over his test results with him in detail today. His baseline sleep efficiency was reduced at 68.1% with a latency  to sleep of 33 minutes and wake after sleep onset of 46.5 minutes with moderate to severe sleep fragmentation noted. His arousal index was mildly elevated. He had an increased percentage of stage I and stage II sleep, absence of slow-wave sleep, and a reduced percentage of REM sleep with a mildly prolonged REM latency. He had no significant EKG changes or periodic leg movements of sleep. He had moderate to loud snoring. He had a total AHI of 15.2 per hour, rising to 38 per hour in REM sleep. His baseline oxygen saturation was 95% with a nadir of 78% during REM sleep. He was then titrated on CPAP. His sleep efficiency improved. His arousal index improved. His sleep architecture showed an increased percentage of stage II sleep, absence of slow-wave sleep and a reduced percentage of REM sleep. Average oxygen saturation was 96%, nadir was 92%, greatly improved. He was titrated on CPAP from 5-7 cm with a reduction of his AHI to 0 per hour on the final pressure. He had REM sleep but no supine sleep was achieved during this part of the study. He indicated that he does not sleep on his back. He is status post multiple back operations. Based on the test results I prescribed CPAP for him.   I reviewed the patient's CPAP compliance data from 06/09/2013 to 07/08/2013, which is a total of 30 days, during which time the patient used CPAP every day except for 1 day. The average usage for all days was 7 hours and 44 minutes. The percent used days greater  than 4 hours was 97 %, indicating excellent compliance. The residual AHI was 1.3 per hour, indicating an appropriate treatment pressure of 7 cwp with EPR of 3.     I reviewed his compliance data of with his machine: 06/10/2013 through 07/14/2013 which is a total of 35 days during which time use CPAP every night with a percent used days greater than 4 hours of 100%, indicating excellent compliance, average usage was 7 hours and 47 minutes, residual AHI low at 1.3 per hour and leak was low at 1.4. Pressure was 7 cm with EPR of 3.  His Past Medical History Is Significant For: Past Medical History:  Diagnosis Date  . Abdominal aortic ectasia ([place])   . Arthritis   . Cervical spondylosis   . Colon polyps   . CVA (cerebral infarction) 2010  . Diabetes mellitus   . Dizziness   . DVT (deep venous thrombosis) ([facility])    bilateral  . ED (erectile dysfunction)   . Foot drop   . H/O Bell's palsy   . Hyperlipidemia   . Hypertension   . Obesity   . OSA on CPAP   . Raynauds syndrome   . Restrictive lung disease   . Spinal stenosis   . Stroke ([place])   . Unsteady gait     His Past Surgical History Is Significant For: Past Surgical History:  Procedure Laterality Date  . CARPAL TUNNEL RELEASE Bilateral   . CERVICAL DISCECTOMY  2004  . LUMBAR DISC SURGERY     x6  . POSTERIOR CERVICAL FUSION/FORAMINOTOMY N/A 06/27/2016   Procedure: POSTERIOR CERVICAL FUSION C2-C5; C2-C4 Decompression;  Surgeon: Melina Schools, MD;  Location: [place];  Service: Orthopedics;  Laterality: N/A;    His Family History Is Significant For: Family History  Problem Relation Age of Onset  . Multiple sclerosis Sister   . Dementia Mother   . Diabetes Mother   . Lung cancer Father   .  Sleep apnea Brother   . Hypertension Brother   . Colon cancer Neg Hx   . Stomach cancer Neg Hx     His Social History Is Significant For: Social History   Socioeconomic History  . Marital status: Married    Spouse name: Nicki Reaper  .  Number of children: 2  . Years of education: 13  . Highest education level: Not on file  Occupational History  . Occupation: RETIRED    Comment: retired  Tobacco Use  . Smoking status: Former Smoker    Types: Cigarettes    Quit date: 01/21/2011    Years since quitting: 8.3  . Smokeless tobacco: Never Used  . Tobacco comment: still smokes occasional cigars  Substance and Sexual Activity  . Alcohol use: No  . Drug use: No  . Sexual activity: Not on file  Other Topics Concern  . Not on file  Social History Narrative   Patient is right handed and resides with wife   Social Determinants of Health   Financial Resource Strain:   . Difficulty of Paying Living Expenses: Not on file  Food Insecurity:   . Worried About Charity fundraiser in the Last Year: Not on file  . Ran Out of Food in the Last Year: Not on file  Transportation Needs:   . Lack of Transportation (Medical): Not on file  . Lack of Transportation (Non-Medical): Not on file  Physical Activity:   . Days of Exercise per Week: Not on file  . Minutes of Exercise per Session: Not on file  Stress:   . Feeling of Stress : Not on file  Social Connections:   . Frequency of Communication with Friends and Family: Not on file  . Frequency of Social Gatherings with Friends and Family: Not on file  . Attends Religious Services: Not on file  . Active Member of Clubs or Organizations: Not on file  . Attends Archivist Meetings: Not on file  . Marital Status: Not on file    His Allergies Are:  No Known Allergies:   His Current Medications Are:  Outpatient Encounter Medications as of 05/09/2019  Medication Sig  . ACCU-CHEK GUIDE test strip USE TO TEST BLOOD SUGAR DAILY  . atorvastatin (LIPITOR) 40 MG tablet Take 0.5 tablets (20 mg total) by mouth at bedtime.  . blood glucose meter kit and supplies KIT Dispense based on patient and insurance preference. Use up to four times daily as directed. (FOR ICD-9 250.00,  250.01).  Marland Kitchen clopidogrel (PLAVIX) 75 MG tablet Take 1 tablet by mouth daily.  . cyclobenzaprine (FLEXERIL) 10 MG tablet Take 1 tablet (10 mg total) by mouth 2 (two) times daily as needed for muscle spasms. (Patient taking differently: Take 10 mg by mouth every 8 (eight) hours as needed for muscle spasms. )  . metFORMIN (GLUCOPHAGE) 1000 MG tablet Take 1,000 mg by mouth 2 (two) times daily with a meal.  . omeprazole (PRILOSEC) 20 MG capsule Take 1 capsule (20 mg total) by mouth daily.  Marland Kitchen oxyCODONE-acetaminophen (PERCOCET/ROXICET) 5-325 MG tablet Take 1 tablet by mouth 3 (three) times daily as needed.  . polyethylene glycol (MIRALAX / GLYCOLAX) packet Take 17 g by mouth 2 (two) times daily.  . sildenafil (REVATIO) 20 MG tablet sildenafil (antihypertensive) 20 mg tablet  . tamsulosin (FLOMAX) 0.4 MG CAPS capsule Take 0.4 mg by mouth daily.  . valsartan-hydrochlorothiazide (DIOVAN-HCT) 160-12.5 MG tablet Take 1 tablet by mouth daily.   No facility-administered encounter medications on  file as of 05/09/2019.  :  Review of Systems:  Out of a complete 14 point review of systems, all are reviewed and negative with the exception of these symptoms as listed below: Review of Systems  Neurological:       Pt reports good usage with cpap machine. No issues reported.    Objective:  Neurological Exam  Physical Exam Physical Examination:   Vitals:   05/09/19 1404  BP: (!) 156/99  Pulse: 95  Temp: (!) 97.3 F (36.3 C)    General Examination: The patient is a very pleasant 69 y.o. male in no acute distress. He appears well-developed and well-nourished and well groomed. Repeat blood pressure was 134/84.  HEENT:Normocephalic, atraumatic, pupils are equal, round and reactive to light, extraocular tracking is good without limitation to gaze excursion or nystagmus noted. Normal smooth pursuit is noted. Corrective eyeglasses in place. Hearing is grossly intact. Face is symmetric with normal facial  animation and slight decrease in sensation in the right midface. Speech is clear, no dysarthria noted and no hypophonia. There is no lip, neck/head, jaw or voice tremor. Neck with mildly decreased ROM. Oropharynx exam reveals: no significant changes, but Moderate mouth dryness noted.  Chest:Clear to auscultation without wheezing, rhonchi or crackles noted.  Heart:S1+S2+0, regular and normal without murmurs, rubs or gallops noted.   Abdomen:Soft, non-tender and non-distended.  Extremities:There isnoedema in the distal lower extremities bilaterally.R AFO in place for chronic R foot drop.His right leg is smaller in caliber than the L, unchanged.   Skin: Warm and dry without trophic changes noted. There are no varicose veins.  Musculoskeletal: exam reveals no obvious joint deformities, tenderness or joint swelling or erythema.   Neurologically:  Mental status: The patient is awake, alert and oriented in all 4 spheres. His memory, attention, language and knowledge are appropriate. There is no aphasia, agnosia, apraxia or anomia. Speech is clear with normal prosody and enunciation. Thought process is linear. Mood is congruent and affect is normal.  Cranial nerves are as described above under HEENT exam.  Motor exam:mild hand muscle wasting. Global strength of 4 out of 5. Slightly weaker on the right side, Right foot drop, has a Velcro type brace around the right ankle. Romberg is not tested for safety.Fine motor skills are mildly impaired. Cerebellar testing shows no dysmetria or intention tremor. There is no truncal or gait ataxia.  Sensory exam is intact to light touch. Gait, station and balance: His posture is fairly good Today, he walks with a single-point cane, no obvious limp, but does walk a little slowly and cautiously, stable overall. Assessment and Plan:   In summary, Chaunce Winkels is a very pleasant60 year old male with an underlying medical history of  hypertension, prior smoking, hyperlipidemia, type 2 diabetes, degenerative back disease, s/p multiple back surgeris, and prior TIAs and left thalamic stroke, who presents for FU consultation of his OSA, well established on CPAP. He has a Hx ofL thalamic strokein the past. He had neck surgery for cervical myelopathy. He has come a long way and has worked hard on rehab. He Received his new CPAP machine last year, he is fully compliant with it and very pleased with the new equipment.  He uses nasal pillows successfully.  He is advised to continue to stay active mentally and physically.  He is advised to stay well-hydrated and follow-up routinely for sleep apnea in 1 year, sooner if needed.  I answered all his questions today and he was in agreement. I spent  30 minutes in total face-to-face time and in reviewing records during pre-charting, more than 50% of which was spent in counseling and coordination of care, reviewing test results, reviewing medication and discussing or reviewing the diagnosis of OSA, the prognosis and treatment options. Pertinent laboratory and imaging test results that were available during this visit with the patient were reviewed by me and considered in my medical decision making (see chart for details).

## 2019-06-04 ENCOUNTER — Other Ambulatory Visit: Payer: Self-pay

## 2019-06-04 ENCOUNTER — Emergency Department (HOSPITAL_COMMUNITY): Payer: Medicare Other

## 2019-06-04 ENCOUNTER — Inpatient Hospital Stay (HOSPITAL_COMMUNITY)
Admission: EM | Admit: 2019-06-04 | Discharge: 2019-06-06 | DRG: 042 | Disposition: A | Discharge status: Home / Self Care | Payer: Medicare Other | Attending: Internal Medicine | Admitting: Internal Medicine

## 2019-06-04 DIAGNOSIS — N529 Male erectile dysfunction, unspecified: Secondary | ICD-10-CM | POA: Diagnosis present

## 2019-06-04 DIAGNOSIS — E1122 Type 2 diabetes mellitus with diabetic chronic kidney disease: Secondary | ICD-10-CM | POA: Diagnosis present

## 2019-06-04 DIAGNOSIS — Z79891 Long term (current) use of opiate analgesic: Secondary | ICD-10-CM

## 2019-06-04 DIAGNOSIS — I63419 Cerebral infarction due to embolism of unspecified middle cerebral artery: Secondary | ICD-10-CM | POA: Diagnosis not present

## 2019-06-04 DIAGNOSIS — M4802 Spinal stenosis, cervical region: Secondary | ICD-10-CM | POA: Diagnosis present

## 2019-06-04 DIAGNOSIS — R402362 Coma scale, best motor response, obeys commands, at arrival to emergency department: Secondary | ICD-10-CM | POA: Diagnosis present

## 2019-06-04 DIAGNOSIS — Z720 Tobacco use: Secondary | ICD-10-CM | POA: Diagnosis present

## 2019-06-04 DIAGNOSIS — Z20822 Contact with and (suspected) exposure to covid-19: Secondary | ICD-10-CM | POA: Diagnosis present

## 2019-06-04 DIAGNOSIS — I639 Cerebral infarction, unspecified: Secondary | ICD-10-CM

## 2019-06-04 DIAGNOSIS — Z8249 Family history of ischemic heart disease and other diseases of the circulatory system: Secondary | ICD-10-CM

## 2019-06-04 DIAGNOSIS — N182 Chronic kidney disease, stage 2 (mild): Secondary | ICD-10-CM | POA: Diagnosis present

## 2019-06-04 DIAGNOSIS — I129 Hypertensive chronic kidney disease with stage 1 through stage 4 chronic kidney disease, or unspecified chronic kidney disease: Secondary | ICD-10-CM | POA: Diagnosis not present

## 2019-06-04 DIAGNOSIS — Z7902 Long term (current) use of antithrombotics/antiplatelets: Secondary | ICD-10-CM

## 2019-06-04 DIAGNOSIS — I69320 Aphasia following cerebral infarction: Secondary | ICD-10-CM

## 2019-06-04 DIAGNOSIS — R531 Weakness: Secondary | ICD-10-CM | POA: Diagnosis not present

## 2019-06-04 DIAGNOSIS — Z79899 Other long term (current) drug therapy: Secondary | ICD-10-CM

## 2019-06-04 DIAGNOSIS — I69954 Hemiplegia and hemiparesis following unspecified cerebrovascular disease affecting left non-dominant side: Secondary | ICD-10-CM | POA: Diagnosis not present

## 2019-06-04 DIAGNOSIS — I7300 Raynaud's syndrome without gangrene: Secondary | ICD-10-CM | POA: Diagnosis present

## 2019-06-04 DIAGNOSIS — R413 Other amnesia: Secondary | ICD-10-CM | POA: Diagnosis not present

## 2019-06-04 DIAGNOSIS — Z86718 Personal history of other venous thrombosis and embolism: Secondary | ICD-10-CM

## 2019-06-04 DIAGNOSIS — F17290 Nicotine dependence, other tobacco product, uncomplicated: Secondary | ICD-10-CM | POA: Diagnosis present

## 2019-06-04 DIAGNOSIS — E785 Hyperlipidemia, unspecified: Secondary | ICD-10-CM | POA: Diagnosis present

## 2019-06-04 DIAGNOSIS — Z981 Arthrodesis status: Secondary | ICD-10-CM

## 2019-06-04 DIAGNOSIS — R29700 NIHSS score 0: Secondary | ICD-10-CM | POA: Diagnosis present

## 2019-06-04 DIAGNOSIS — M47812 Spondylosis without myelopathy or radiculopathy, cervical region: Secondary | ICD-10-CM | POA: Diagnosis present

## 2019-06-04 DIAGNOSIS — E1169 Type 2 diabetes mellitus with other specified complication: Secondary | ICD-10-CM

## 2019-06-04 DIAGNOSIS — I10 Essential (primary) hypertension: Secondary | ICD-10-CM | POA: Diagnosis present

## 2019-06-04 DIAGNOSIS — R402252 Coma scale, best verbal response, oriented, at arrival to emergency department: Secondary | ICD-10-CM | POA: Diagnosis present

## 2019-06-04 DIAGNOSIS — Z86010 Personal history of colonic polyps: Secondary | ICD-10-CM

## 2019-06-04 DIAGNOSIS — R4781 Slurred speech: Secondary | ICD-10-CM | POA: Diagnosis not present

## 2019-06-04 DIAGNOSIS — R402142 Coma scale, eyes open, spontaneous, at arrival to emergency department: Secondary | ICD-10-CM | POA: Diagnosis present

## 2019-06-04 DIAGNOSIS — R4701 Aphasia: Secondary | ICD-10-CM | POA: Diagnosis not present

## 2019-06-04 DIAGNOSIS — G4733 Obstructive sleep apnea (adult) (pediatric): Secondary | ICD-10-CM | POA: Diagnosis not present

## 2019-06-04 DIAGNOSIS — E1142 Type 2 diabetes mellitus with diabetic polyneuropathy: Secondary | ICD-10-CM

## 2019-06-04 DIAGNOSIS — I63232 Cerebral infarction due to unspecified occlusion or stenosis of left carotid arteries: Secondary | ICD-10-CM | POA: Diagnosis not present

## 2019-06-04 DIAGNOSIS — E1129 Type 2 diabetes mellitus with other diabetic kidney complication: Secondary | ICD-10-CM | POA: Diagnosis present

## 2019-06-04 DIAGNOSIS — Z9989 Dependence on other enabling machines and devices: Secondary | ICD-10-CM

## 2019-06-04 DIAGNOSIS — M21371 Foot drop, right foot: Secondary | ICD-10-CM | POA: Diagnosis present

## 2019-06-04 DIAGNOSIS — Z7984 Long term (current) use of oral hypoglycemic drugs: Secondary | ICD-10-CM

## 2019-06-04 DIAGNOSIS — N400 Enlarged prostate without lower urinary tract symptoms: Secondary | ICD-10-CM | POA: Diagnosis present

## 2019-06-04 DIAGNOSIS — E1140 Type 2 diabetes mellitus with diabetic neuropathy, unspecified: Secondary | ICD-10-CM | POA: Diagnosis not present

## 2019-06-04 LAB — DIFFERENTIAL
Abs Immature Granulocytes: 0.01 10*3/uL (ref 0.00–0.07)
Basophils Absolute: 0 10*3/uL (ref 0.0–0.1)
Basophils Relative: 1 %
Eosinophils Absolute: 0.4 10*3/uL (ref 0.0–0.5)
Eosinophils Relative: 6 %
Immature Granulocytes: 0 %
Lymphocytes Relative: 30 %
Lymphs Abs: 1.8 10*3/uL (ref 0.7–4.0)
Monocytes Absolute: 0.9 10*3/uL (ref 0.1–1.0)
Monocytes Relative: 14 %
Neutro Abs: 3.1 10*3/uL (ref 1.7–7.7)
Neutrophils Relative %: 49 %

## 2019-06-04 LAB — COMPREHENSIVE METABOLIC PANEL
ALT: 33 U/L (ref 0–44)
AST: 42 U/L — ABNORMAL HIGH (ref 15–41)
Albumin: 3.8 g/dL (ref 3.5–5.0)
Alkaline Phosphatase: 318 U/L — ABNORMAL HIGH (ref 38–126)
Anion gap: 13 (ref 5–15)
BUN: 18 mg/dL (ref 8–23)
CO2: 24 mmol/L (ref 22–32)
Calcium: 9.7 mg/dL (ref 8.9–10.3)
Chloride: 101 mmol/L (ref 98–111)
Creatinine, Ser: 1.28 mg/dL — ABNORMAL HIGH (ref 0.61–1.24)
GFR calc Af Amer: >60 mL/min (ref >60)
GFR calc non Af Amer: 57 mL/min — ABNORMAL LOW (ref >60)
Glucose, Bld: 126 mg/dL — ABNORMAL HIGH (ref 70–99)
Potassium: 4.4 mmol/L (ref 3.5–5.1)
Sodium: 138 mmol/L (ref 135–145)
Total Bilirubin: 0.7 mg/dL (ref 0.3–1.2)
Total Protein: 8.3 g/dL — ABNORMAL HIGH (ref 6.5–8.1)

## 2019-06-04 LAB — CBC
HCT: 37.6 % — ABNORMAL LOW (ref 39.0–52.0)
Hemoglobin: 12.2 g/dL — ABNORMAL LOW (ref 13.0–17.0)
MCH: 29.5 pg (ref 26.0–34.0)
MCHC: 32.4 g/dL (ref 30.0–36.0)
MCV: 90.8 fL (ref 80.0–100.0)
Platelets: 350 10*3/uL (ref 150–400)
RBC: 4.14 MIL/uL — ABNORMAL LOW (ref 4.22–5.81)
RDW: 13.1 % (ref 11.5–15.5)
WBC: 6.2 10*3/uL (ref 4.0–10.5)
nRBC: 0 % (ref 0.0–0.2)

## 2019-06-04 LAB — APTT: aPTT: 32 seconds (ref 24–36)

## 2019-06-04 LAB — I-STAT CHEM 8, ED
BUN: 27 mg/dL — ABNORMAL HIGH (ref 8–23)
Calcium, Ion: 1.14 mmol/L — ABNORMAL LOW (ref 1.15–1.40)
Chloride: 102 mmol/L (ref 98–111)
Creatinine, Ser: 1.3 mg/dL — ABNORMAL HIGH (ref 0.61–1.24)
Glucose, Bld: 126 mg/dL — ABNORMAL HIGH (ref 70–99)
HCT: 37 % — ABNORMAL LOW (ref 39.0–52.0)
Hemoglobin: 12.6 g/dL — ABNORMAL LOW (ref 13.0–17.0)
Potassium: 5.5 mmol/L — ABNORMAL HIGH (ref 3.5–5.1)
Sodium: 136 mmol/L (ref 135–145)
TCO2: 29 mmol/L (ref 22–32)

## 2019-06-04 LAB — PROTIME-INR
INR: 1 (ref 0.8–1.2)
Prothrombin Time: 12.9 seconds (ref 11.4–15.2)

## 2019-06-04 MED ORDER — SODIUM CHLORIDE 0.9% FLUSH
3.0000 mL | Freq: Once | INTRAVENOUS | Status: DC
Start: 2019-06-04 — End: 2019-06-06

## 2019-06-04 NOTE — ED Provider Notes (Signed)
[facility] EMERGENCY DEPARTMENT Provider Note   CSN: [id number] Arrival date & time: 06/04/19  1655     History No chief complaint on file.   Rodney Crane is a 69 y.o. male.  Patient with hypertension, hyperlipidemia, type 2 diabetes, degenerative back diseaseand stroke (left thalamic on MRI in 3/19) --presents to the emergency department with multiple episodes, greater than 5, of difficulty speaking today.  Patient describes knowing what he wants to say but being unable to get the words out or stumbling over the words with speech.  This has been intermittent throughout the day and started at approximately 9 AM.  Last episode was 1 to 2 hours ago.  For instance, he was having a conversation with his brother on the phone today and "could not talk" for about 5 minutes.  Patient has some residual foot drop and weakness in his right leg but states that this morning both legs felt heavy.  No upper extremity symptoms.  No double vision or loss of vision.  He states that the base of his tongue felt swollen at times during these episodes.  No facial droop or slurred speech.  No vertigo.  Patient takes Plavix and is compliant.  No recent illnesses.         Past Medical History:  Diagnosis Date  . Abdominal aortic ectasia ([place])   . Arthritis   . Cervical spondylosis   . Colon polyps   . CVA (cerebral infarction) 2010  . Diabetes mellitus   . Dizziness   . DVT (deep venous thrombosis) ([facility])    bilateral  . ED (erectile dysfunction)   . Foot drop   . H/O Bell's palsy   . Hyperlipidemia   . Hypertension   . Obesity   . OSA on CPAP   . Raynauds syndrome   . Restrictive lung disease   . Spinal stenosis   . Stroke ([place])   . Unsteady gait     Patient Active Problem List   Diagnosis Date Noted  . Pain due to onychomycosis of toenails of both feet 10/25/2018  . Acute deep vein thrombosis (DVT) of distal vein of both lower extremities ([place]) 07/07/2016  . Abdominal  distention   . Abdominal pain   . S/P cervical spinal fusion   . Type 2 diabetes mellitus with peripheral neuropathy ([facility])   . Hyperlipidemia   . Neurogenic bowel   . Myelopathy ([place])   . OSA on CPAP   . Surgery, elective   . History of CVA (cerebrovascular accident)   . Benign essential HTN   . History of cervical spinal surgery   . History of lumbar surgery   . Tobacco abuse   . Diastolic dysfunction   . Acute blood loss anemia   . Post-operative pain   . Tachycardia   . Leukocytosis   . Spinal stenosis of lumbar region   . Central cord syndrome ([place])   . Acute hypoxemic respiratory failure ([place])   . Spinal stenosis in cervical region 06/27/2016  . Cervical stenosis of spinal canal 06/25/2016  . Weakness of extremity 06/25/2016  . OSA (obstructive sleep apnea)   . Hypertension   . Diabetes ([place])   . DYSPNEA ON EXERTION 10/06/2009  . Nonspecific (abnormal) findings on radiological and other examination of body structure 10/06/2009  . CT, CHEST, ABNORMAL 10/06/2009  . COLONIC POLYPS 10/05/2009  . DIABETES, TYPE 2 10/05/2009  . HYPERLIPIDEMIA 10/05/2009  . 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 Essential hypertension 10/05/2009  .  CVA 10/05/2009  . 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 CARPAL TUNNEL RELEASE Bilateral   . CERVICAL DISCECTOMY  2004  . LUMBAR DISC SURGERY     x6  . POSTERIOR CERVICAL FUSION/FORAMINOTOMY N/A 06/27/2016   Procedure: POSTERIOR CERVICAL FUSION C2-C5; C2-C4 Decompression;  Surgeon: Melina Schools, MD;  Location: [facility];  Service: Orthopedics;  Laterality: N/A;       Family History  Problem Relation Age of Onset  . Multiple sclerosis Sister   . Dementia Mother   . Diabetes Mother   . Lung cancer Father   . Sleep apnea Brother   . Hypertension Brother   . Colon cancer Neg Hx   . Stomach cancer Neg Hx     Social History   Tobacco Use  . Smoking status: Former Smoker    Types:  Cigarettes    Quit date: 01/21/2011    Years since quitting: 8.3  . Smokeless tobacco: Never Used  . Tobacco comment: still smokes occasional cigars  Substance Use Topics  . Alcohol use: No  . Drug use: No    Home Medications Prior to Admission medications   Medication Sig Start Date End Date Taking? Authorizing Provider  ACCU-CHEK GUIDE test strip USE TO TEST BLOOD SUGAR DAILY 05/14/18   [provider]  atorvastatin (LIPITOR) 40 MG tablet Take 0.5 tablets (20 mg total) by mouth at bedtime. 07/20/16   Angiulli, Lavon Paganini, PA-C  blood glucose meter kit and supplies KIT Dispense based on patient and insurance preference. Use up to four times daily as directed. (FOR ICD-9 250.00, 250.01). 08/01/16   Meredith Staggers, MD  clopidogrel (PLAVIX) 75 MG tablet Take 1 tablet by mouth daily. 03/15/17   [provider]  cyclobenzaprine (FLEXERIL) 10 MG tablet Take 1 tablet (10 mg total) by mouth 2 (two) times daily as needed for muscle spasms. Patient taking differently: Take 10 mg by mouth every 8 (eight) hours as needed for muscle spasms.  08/28/16   Elnora Morrison, MD  metFORMIN (GLUCOPHAGE) 1000 MG tablet Take 1,000 mg by mouth 2 (two) times daily with a meal.    [provider]  omeprazole (PRILOSEC) 20 MG capsule Take 1 capsule (20 mg total) by mouth daily. 11/19/18   Irene Shipper, MD  oxyCODONE-acetaminophen (PERCOCET/ROXICET) 5-325 MG tablet Take 1 tablet by mouth 3 (three) times daily as needed. 03/13/17   [provider]  polyethylene glycol (MIRALAX / GLYCOLAX) packet Take 17 g by mouth 2 (two) times daily. 07/04/16   Mariel Aloe, MD  sildenafil (REVATIO) 20 MG tablet sildenafil (antihypertensive) 20 mg tablet    [provider]  tamsulosin (FLOMAX) 0.4 MG CAPS capsule Take 0.4 mg by mouth daily. 02/20/17   [provider]  valsartan-hydrochlorothiazide (DIOVAN-HCT) 160-12.5 MG tablet Take 1 tablet by mouth daily. 02/18/17   [provider]    Allergies    Patient has no known allergies.  Review of Systems   Review of Systems  Constitutional: Negative for fever.  HENT: Negative for congestion, dental problem, rhinorrhea and sinus pressure.   Eyes: Negative for photophobia, discharge, redness and visual disturbance.  Respiratory: Negative for shortness of breath.   Cardiovascular: Negative for chest pain.  Gastrointestinal: Negative for nausea and vomiting.  Musculoskeletal: Negative for gait problem, neck pain and neck stiffness.  Skin: Negative for rash.  Neurological: Positive for speech difficulty. Negative for syncope, weakness, light-headedness, numbness and headaches.  Psychiatric/Behavioral: Negative for confusion.    Physical Exam Updated Vital Signs BP (!) 154/92 (BP Location: Right Arm)   Pulse 78   Temp 98.4 F (36.9 C) (Oral)   Resp 15   SpO2 100%   Physical Exam Vitals and nursing note reviewed.  Constitutional:      Appearance: He is well-developed.  HENT:     Head: Normocephalic and atraumatic.     Right Ear: Tympanic membrane, ear canal and external ear normal.     Left Ear: Tympanic membrane, ear canal and external ear normal.     Nose: Nose normal.     Mouth/Throat:     Pharynx: Uvula midline.  Eyes:     General: Lids are normal.     Conjunctiva/sclera: Conjunctivae normal.     Pupils: Pupils are equal, round, and reactive to light.  Cardiovascular:     Rate and Rhythm: Normal rate and regular rhythm.  Pulmonary:     Effort: Pulmonary effort is normal.     Breath sounds: Normal breath sounds.  Abdominal:     Palpations: Abdomen is soft.     Tenderness: There is no abdominal tenderness.  Musculoskeletal:        General: Normal range of motion.     Cervical back: Normal range of motion and neck supple. No tenderness or bony tenderness.  Skin:    General: Skin is warm and dry.  Neurological:     Mental Status: He is alert and oriented to person, place, and time.  Mental status is at baseline.     GCS: GCS eye subscore is 4. GCS verbal subscore is 5. GCS motor subscore is 6.     Cranial Nerves: No cranial nerve deficit.     Sensory: No sensory deficit.     Motor: No abnormal muscle tone.     Coordination: Coordination normal.     Gait: Gait normal.     Deep Tendon Reflexes: Reflexes are normal and symmetric.     Comments: Patient with normal strength in upper and lower extremities with exception of right ankle consistent with previous foot drop.     ED Results / Procedures / Treatments   Labs (all labs ordered are listed, but only abnormal results are displayed) Labs Reviewed  CBC - Abnormal; Notable for the following components:      Result Value   RBC 4.14 (*)    Hemoglobin 12.2 (*)    HCT 37.6 (*)    All other components within normal limits  COMPREHENSIVE METABOLIC PANEL - Abnormal; Notable for the following components:   Glucose, Bld 126 (*)    Creatinine, Ser 1.28 (*)    Total Protein 8.3 (*)    AST 42 (*)    Alkaline Phosphatase 318 (*)    GFR calc non Af Amer 57 (*)    All other components within normal limits  I-STAT CHEM 8, ED - Abnormal; Notable for the following components:   Potassium 5.5 (*)    BUN 27 (*)    Creatinine, Ser 1.30 (*)    Glucose, Bld 126 (*)    Calcium, Ion 1.14 (*)    Hemoglobin 12.6 (*)    HCT 37.0 (*)    All other components within normal limits  RESPIRATORY PANEL BY RT PCR (FLU A&B, COVID)  PROTIME-INR  APTT  DIFFERENTIAL  CBG MONITORING, ED    ED ECG REPORT   Date: 06/04/2019  Rate: 90  Rhythm: normal sinus rhythm  QRS Axis:  normal  Intervals: normal  ST/T Wave abnormalities: nonspecific T wave changes  Conduction Disutrbances:none  Narrative Interpretation:   Old EKG Reviewed: unchanged except lateral t-waves more pronounced today  I have personally reviewed the EKG tracing and agree with the computerized printout as noted.  Radiology CT HEAD WO CONTRAST  Result Date:  06/04/2019 CLINICAL DATA:  Slurred speech, memory loss, lack of concentration, symptoms since 9 a.m. today EXAM: CT HEAD WITHOUT CONTRAST TECHNIQUE: Contiguous axial images were obtained from the base of the skull through the vertex without intravenous contrast. COMPARISON:  06/25/2016 FINDINGS: Brain: Dilated perivascular space versus chronic lacunar infarct left thalamus unchanged. No acute infarct or hemorrhage. Lateral ventricles and remaining midline structures are unremarkable. No acute extra-axial fluid collections. No mass effect. Vascular: No hyperdense vessel or unexpected calcification. Skull: Normal. Negative for fracture or focal lesion. Sinuses/Orbits: No acute finding. Other: None IMPRESSION: 1. No acute intracranial process.  The Electronically Signed   By: Randa Ngo M.D.   On: 06/04/2019 18:23    Procedures Procedures (including critical care time)  Medications Ordered in ED Medications  sodium chloride flush (NS) 0.9 % injection 3 mL (has no administration in time range)    ED Course  I have reviewed the triage vital signs and the nursing notes.  Pertinent labs & imaging results that were available during my care of the patient were reviewed by me and considered in my medical decision making (see chart for details).  Patient seen and examined. Work-up reassuring to this point.  Vital signs reviewed and are as follows: BP (!) 148/129   Pulse 79   Temp 98.4 F (36.9 C) (Oral)   Resp 17   SpO2 100%   I touched base with Dr. Rory Percy.  Request MRI brain without contrast, call back when complete.  MRI completed.  Reviewed with neuro hospitalist.  After discussion with neuroradiologist, patient appears to have several punctate infarcts concerning for embolic stroke.  Patient updated.  Dr. Rory Percy has seen.    I have discussed the case with Dr. Roel Cluck who will see patient.     MDM Rules/Calculators/A&P                      Admit for stroke.   Final Clinical  Impression(s) / ED Diagnoses Final diagnoses:  Cerebrovascular accident (CVA), unspecified mechanism [facility])    Rx / [place] Orders ED Discharge Orders    None       Carlisle Cater, Hershal Coria 06/05/19 0023    Carmin Muskrat, MD 06/05/19 1036

## 2019-06-04 NOTE — ED Triage Notes (Signed)
Pt from PCP for slurred speech and numbness of mouth that began around 0900 this am.  Pt stated "I know what I want to say but I couldn't get the words out".

## 2019-06-04 NOTE — Consult Note (Signed)
Neurology Consultation  Reason for Consult: Stroke Referring Physician: Alecia Lemming, PA / Adrian Prows MD  CC: Speech difficulty  History is obtained from: Patient, chart  HPI: Rodney Crane is a 69 y.o. male past medical history of stroke with no residual deficits, TIAs in the past, hypertension, hyperlipidemia, obesity, sleep apnea, baseline right foot drop likely secondary to multiple back surgeries, tobacco abuse, came to the emergency room for evaluation of multiple episodes concerning for strokes. He says that he woke up normal this morning and around 9 AM noticed that he was unable to say what he wanted to say.  He said he knew the words but they would not come out of his mouth.  He was at a bank with his wife and could not explain to her what he wanted to do in terms of the transaction at the bank.  He also got a phone call from a family member and was having a difficult time maintaining a conversation.  He was seen by his primary care physician, who also noted that the patient might be having some difficulty using his right leg-she has a right foot drop at baseline but he was having some more difficulty using the right hip.  He was sent in for an emergent stroke evaluation. On initial emergency room evaluation and screening, his NIH stroke scale was 0.  I was called for further recommendations on his care and recommended an MRI be done. After the completion of MRI, I reviewed the MRI with the neuroradiologist which was concerning for punctate scattered acute/subacute strokes in both the cerebral hemispheres.  He denies having had a big stroke in the past and his stroke was incidentally found on his brain imaging-left thalamic area.  He says that he has had episodes at times where he has had numbness on his face and was told that those were probably TIAs.  Also reports having the sensation that his tongue is swollen since this morning.  No dyspnea or dysphagia.  For sleep apnea, he follows  with Dr. Star Age at [facility] neurology, and reports compliance to his CPAP.   LKW: 9 AM on 06/04/2019 tpa given?: no, outside the window, NIH 0 Premorbid modified Rankin scale (mRS): 2-due to cane use for foot drop   ROS: ROS was performed and is negative except as noted in the HPI.    Past Medical History:  Diagnosis Date  . Abdominal aortic ectasia ([place])   . Arthritis   . Cervical spondylosis   . Colon polyps   . CVA (cerebral infarction) 2010  . Diabetes mellitus   . Dizziness   . DVT (deep venous thrombosis) ([facility])    bilateral  . ED (erectile dysfunction)   . Foot drop   . H/O Bell's palsy   . Hyperlipidemia   . Hypertension   . Obesity   . OSA on CPAP   . Raynauds syndrome   . Restrictive lung disease   . Spinal stenosis   . Stroke ([place])   . Unsteady gait     Family History  Problem Relation Age of Onset  . Multiple sclerosis Sister   . Dementia Mother   . Diabetes Mother   . Lung cancer Father   . Sleep apnea Brother   . Hypertension Brother   . Colon cancer Neg Hx   . Stomach cancer Neg Hx    Social History:   reports that he quit smoking about 8 years ago. His smoking use included cigarettes. He  has never used smokeless tobacco. He reports that he does not drink alcohol or use drugs.  Medications  Current Facility-Administered Medications:  .  sodium chloride flush (NS) 0.9 % injection 3 mL, 3 mL, Intravenous, Once, Lawyer, Harrell Gave, PA-C  Current Outpatient Medications:  .  ACCU-CHEK GUIDE test strip, USE TO TEST BLOOD SUGAR DAILY, Disp: , Rfl:  .  atorvastatin (LIPITOR) 40 MG tablet, Take 0.5 tablets (20 mg total) by mouth at bedtime., Disp: 30 tablet, Rfl: 7 .  blood glucose meter kit and supplies KIT, Dispense based on patient and insurance preference. Use up to four times daily as directed. (FOR ICD-9 250.00, 250.01)., Disp: 1 each, Rfl: 0 .  clopidogrel (PLAVIX) 75 MG tablet, Take 1 tablet by mouth daily., Disp: , Rfl:  .  cyclobenzaprine  (FLEXERIL) 10 MG tablet, Take 1 tablet (10 mg total) by mouth 2 (two) times daily as needed for muscle spasms. (Patient taking differently: Take 10 mg by mouth every 8 (eight) hours as needed for muscle spasms. ), Disp: 12 tablet, Rfl: 0 .  metFORMIN (GLUCOPHAGE) 1000 MG tablet, Take 1,000 mg by mouth 2 (two) times daily with a meal., Disp: , Rfl:  .  omeprazole (PRILOSEC) 20 MG capsule, Take 1 capsule (20 mg total) by mouth daily., Disp: 30 capsule, Rfl: 11 .  oxyCODONE-acetaminophen (PERCOCET/ROXICET) 5-325 MG tablet, Take 1 tablet by mouth 3 (three) times daily as needed., Disp: , Rfl: 0 .  polyethylene glycol (MIRALAX / GLYCOLAX) packet, Take 17 g by mouth 2 (two) times daily., Disp: , Rfl:  .  sildenafil (REVATIO) 20 MG tablet, sildenafil (antihypertensive) 20 mg tablet, Disp: , Rfl:  .  tamsulosin (FLOMAX) 0.4 MG CAPS capsule, Take 0.4 mg by mouth daily., Disp: , Rfl: 11 .  valsartan-hydrochlorothiazide (DIOVAN-HCT) 160-12.5 MG tablet, Take 1 tablet by mouth daily., Disp: , Rfl: 11  Exam: Current vital signs: BP (!) 161/89   Pulse 71   Temp 98.4 F (36.9 C) (Oral)   Resp 20   SpO2 100%  Vital signs in last 24 hours: Temp:  [98.4 F (36.9 C)] 98.4 F (36.9 C) (02/09 1706) Pulse Rate:  [71-92] 71 (02/09 2215) Resp:  [15-26] 20 (02/09 2215) BP: (148-173)/(87-129) 161/89 (02/09 2215) SpO2:  [100 %] 100 % (02/09 2215) GENERAL: Awake, alert in NAD HEENT: - Normocephalic and atraumatic, dry mm, no LN++, no Thyromegally LUNGS - Clear to auscultation bilaterally with no wheezes CV - S1S2 RRR, no m/r/g, equal pulses bilaterally. ABDOMEN - Soft, nontender, nondistended with normoactive BS Ext: warm, well perfused, intact peripheral pulses, no edema  NEURO:  Mental Status: AA&Ox3  Language: speech is nondysarthric.  Naming, repetition, fluency, and comprehension intact. Cranial Nerves: PERRL. EOMI, visual fields full, no facial asymmetry, facial sensation intact, hearing intact,  tongue/uvula/soft palate midline, normal sternocleidomastoid and trapezius muscle strength. No evidence of tongue atrophy or fibrillations Motor: Right plantar and dorsiflexion are 1/5-other than that symmetric antigravity without any drift in any of the extremities.. Tone: is normal and bulk is normal Sensation- Intact to light touch bilaterally Coordination: FTN intact bilaterally  NIHSS-0   Labs I have reviewed labs in epic and the results pertinent to this consultation are: CBC    Component Value Date/Time   WBC 6.2 06/04/2019 1704   RBC 4.14 (L) 06/04/2019 1704   HGB 12.6 (L) 06/04/2019 1725   HCT 37.0 (L) 06/04/2019 1725   PLT 350 06/04/2019 1704   MCV 90.8 06/04/2019 1704   MCH 29.5 06/04/2019  1704   MCHC 32.4 06/04/2019 1704   RDW 13.1 06/04/2019 1704   LYMPHSABS 1.8 06/04/2019 1704   MONOABS 0.9 06/04/2019 1704   EOSABS 0.4 06/04/2019 1704   BASOSABS 0.0 06/04/2019 1704    CMP     Component Value Date/Time   NA 136 06/04/2019 1725   K 5.5 (H) 06/04/2019 1725   CL 102 06/04/2019 1725   CO2 24 06/04/2019 1704   GLUCOSE 126 (H) 06/04/2019 1725   BUN 27 (H) 06/04/2019 1725   CREATININE 1.30 (H) 06/04/2019 1725   CALCIUM 9.7 06/04/2019 1704   PROT 8.3 (H) 06/04/2019 1704   ALBUMIN 3.8 06/04/2019 1704   AST 42 (H) 06/04/2019 1704   ALT 33 06/04/2019 1704   ALKPHOS 318 (H) 06/04/2019 1704   BILITOT 0.7 06/04/2019 1704   GFRNONAA 57 (L) 06/04/2019 1704   GFRAA >60 06/04/2019 1704   Imaging I have reviewed the images obtained:  CT-scan of the brain-no acute process  MRI examination of the brain-although initially read normal, discussed with the neuroradiologist after review and there are multiple areas of punctate restricted diffusion without very clear correlate but the image also appears to be having some artifacts.  Given the symptoms, an embolic stroke is difficult to rule out.  Assessment: 69 year old with above past medical history presenting for episodes  of difficulty getting his words out and also possibly right-sided weakness. MRI brain, poor quality but likely consistent with bihemispheric punctate areas of restricted diffusion. He would benefit from admission for stroke risk factor work-up. He is outside the window for IV TPA. His exam is not consistent at this time with large vessel occlusion, but I will obtain vessel imaging for risk factor work-up completion.  Impression: Evaluate for stroke/likely cardioembolic  Recommendations: Admit to hospitalist Telemetry Frequent neurochecks For now continue his Plavix.  Will await vessel imaging and final decision on outpatient antiplatelet therapy after stroke team rounding. 2D echo A1c Lipid panel PT OT Speech therapy Allow for permissive hypertension-treat only if systolic blood pressures are more than 220 on a as needed basis.  Start normalizing blood pressure after 48 to 72 hours of the last known well. Outpatient blood pressure goal less than 140/90.  Stroke team will follow with you.   -- Amie Portland, MD [facility] Pager: [phone] If 7pm to 7am, please call on call as listed on AMION.

## 2019-06-04 NOTE — H&P (Signed)
Rodney Crane AXK:[id number] DOB: 09/19/1950 DOA: 06/04/2019     PCP: Marton Redwood, MD   Outpatient Specialists:     Neurology  Dr. Wilhemina Cash    Patient arrived to ER on 06/04/19 at 1655  Patient coming from: home Lives  With family    Chief Complaint: Difficulty with speech HPI: Rodney Crane is a 69 y.o. male with medical history significant of HLD, OSA on CPAP, HTN  smoking, hyperlipidemia, type 2 diabetes, degenerative back disease, s/p 7 back surgeries, and stroke (left thalamic on MRI), history of bilateral DVT history of foot drop and Bell's palsy   Presented with slurred speech and numbness in the mouth that started at 9 AM.  He states that he knew what he wanted to say but just could not get the words out Symptoms been coming and going throughout the day.He was trying to talk to his brother on the phone  He could not talk for about 5 minutes.  Reports residual foot drop weakness in the right leg but this morning both legs felt somewhat heavy.  No double vision.  No vertigo  Patient has known history of CVA and takes Plavix not on Asapirin Smokes cigars on occasion Walks with cane   Infectious risk factors:  Reports None   In  ER RAPID COVID TEST   in house  PCR testing  Pending  No results found for: SARSCOV2NAA   Regarding pertinent Chronic problems:     Hyperlipidemia -  on statins lipitor   HTN on valsartan     DM 2 -  Lab Results  Component Value Date   HGBA1C 6.0 (H) 06/27/2016     PO meds only      obesity-   BMI Readings from Last 1 Encounters:  05/09/19 33.37 kg/m     OSA -on nocturnal CPAP,     Hx of CVA -   on Aspirin 81 mg,  Plavix    CKD stage II  - baseline Cr 1.1 Lab Results  Component Value Date   CREATININE 1.30 (H) 06/04/2019   CREATININE 1.28 (H) 06/04/2019   CREATININE 1.16 08/28/2016   BPH flomax   While in ER: MRI done in Er showed multiple punctate   ER Provider Called:   Neurology   Dr.Arora  They Recommend admit  to medicine    Will see    in ER   The following Work up has been ordered so far:  Orders Placed This Encounter  Procedures  . CT HEAD WO CONTRAST  . MR BRAIN WO CONTRAST  . CT Angio Head W or Wo Contrast  . CT Angio Neck W and/or Wo Contrast  . Protime-INR  . APTT  . CBC  . Differential  . Comprehensive metabolic panel  . Diet NPO time specified  . Cardiac monitoring  . Stroke swallow screen  . NIH Stroke Scale  . Modified Stroke Scale (mNIHSS) Document mNIHSS assessment every 2 hours for a total of 12 hours  . Saline Lock IV, Maintain IV access  . If O2 Sat <94% administer O2 at 2 liters/minute via nasal cannula  . Janeece Riggers to [employer] [phone]  . Consult to Neuro Hospitalist  ALL PATIENTS BEING ADMITTED/HAVING PROCEDURES NEED COVID-19 SCREENING  . Consult to Neuro Hospitalist  ALL PATIENTS BEING ADMITTED/HAVING PROCEDURES NEED COVID-19 SCREENING  . Consult to hospitalist  ALL PATIENTS BEING ADMITTED/HAVING PROCEDURES NEED COVID-19 SCREENING  . Pulse oximetry, continuous  . I-stat chem 8,  ED  . CBG monitoring, ED  . EKG 12-Lead  . ED EKG     Following Medications were ordered in ER: Medications  sodium chloride flush (NS) 0.9 % injection 3 mL (has no administration in time range)        Consult Orders  (From admission, onward)         Start     Ordered   06/04/19 2334  Consult to hospitalist  ALL PATIENTS BEING ADMITTED/HAVING PROCEDURES NEED COVID-19 SCREENING Paged [facility], roxanne  Once    Comments: ALL PATIENTS BEING ADMITTED/HAVING PROCEDURES NEED COVID-19 SCREENING  Provider:  (Not yet assigned)  Question Answer Comment  Place call to: [facility]   Reason for Consult Admit      06/04/19 2333   06/04/19 2321  Consult to Neuro Hospitalist  ALL PATIENTS BEING ADMITTED/HAVING PROCEDURES NEED COVID-19 SCREENING Paged neuro, roxanne  Once    Comments: ALL PATIENTS BEING ADMITTED/HAVING PROCEDURES NEED COVID-19 SCREENING    Provider:  (Not yet assigned)  Question Answer Comment  Place call to: Neuro Hospitalist on call   Reason for Consult Admit      06/04/19 2320   06/04/19 2135  Consult to Neuro Hospitalist  ALL PATIENTS BEING ADMITTED/HAVING PROCEDURES NEED COVID-19 SCREENING Paged neuro, roxanne  Once    Comments: ALL PATIENTS BEING ADMITTED/HAVING PROCEDURES NEED COVID-19 SCREENING  Provider:  (Not yet assigned)  Question Answer Comment  Place call to: Neuro Hospitalist on call   Reason for Consult Admit      06/04/19 2134           Significant initial  Findings: Abnormal Labs Reviewed  CBC - Abnormal; Notable for the following components:      Result Value   RBC 4.14 (*)    Hemoglobin 12.2 (*)    HCT 37.6 (*)    All other components within normal limits  COMPREHENSIVE METABOLIC PANEL - Abnormal; Notable for the following components:   Glucose, Bld 126 (*)    Creatinine, Ser 1.28 (*)    Total Protein 8.3 (*)    AST 42 (*)    Alkaline Phosphatase 318 (*)    GFR calc non Af Amer 57 (*)    All other components within normal limits  I-STAT CHEM 8, ED - Abnormal; Notable for the following components:   Potassium 5.5 (*)    BUN 27 (*)    Creatinine, Ser 1.30 (*)    Glucose, Bld 126 (*)    Calcium, Ion 1.14 (*)    Hemoglobin 12.6 (*)    HCT 37.0 (*)    All other components within normal limits    Otherwise labs showing:    Recent Labs  Lab 06/04/19 1704 06/04/19 1725  NA 138 136  K 4.4 5.5*  CO2 24  --   GLUCOSE 126* 126*  BUN 18 27*  CREATININE 1.28* 1.30*  CALCIUM 9.7  --     Cr  Up from baseline see below Lab Results  Component Value Date   CREATININE 1.30 (H) 06/04/2019   CREATININE 1.28 (H) 06/04/2019   CREATININE 1.16 08/28/2016    Recent Labs  Lab 06/04/19 1704  AST 42*  ALT 33  ALKPHOS 318*  BILITOT 0.7  PROT 8.3*  ALBUMIN 3.8   Lab Results  Component Value Date   CALCIUM 9.7 06/04/2019     WBC      Component Value Date/Time   WBC 6.2  06/04/2019 1704   ANC  Component Value Date/Time   NEUTROABS 3.1 06/04/2019 1704   ALC No components found for: LYMPHAB    Plt: Lab Results  Component Value Date   PLT 350 06/04/2019    Lactic Acid, Venous    Component Value Date/Time   LATICACIDVEN 1.54 08/28/2016 1332     HG/HCT   stable,      Component Value Date/Time   HGB 12.6 (L) 06/04/2019 1725   HCT 37.0 (L) 06/04/2019 1725       ECG: Ordered Personally reviewed by me showing: HR : 90 Rhythm:  NSR,    nonspecific changes,  QTC 411      UA  ordered    CT HEAD  NON acute  CXR -   Ordered MRI brain showed  punctate lesions as per neurologist reviewed ED Triage Vitals  Enc Vitals Group     BP 06/04/19 1706 (!) 154/96     Pulse Rate 06/04/19 1706 92     Resp 06/04/19 1706 16     Temp 06/04/19 1706 98.4 F (36.9 C)     Temp Source 06/04/19 1706 Oral     SpO2 06/04/19 1706 100 %     Weight --      Height --      Head Circumference --      Peak Flow --      Pain Score 06/04/19 1702 0     Pain Loc --      Pain Edu? --      Excl. in [place]? --   TMAX(24)@       Latest  Blood pressure (!) 161/89, pulse 71, temperature 98.4 F (36.9 C), temperature source Oral, resp. rate 20, SpO2 100 %.     Hospitalist was called for admission for slurred speech difficulty finding words   Review of Systems:    Pertinent positives include: fatigue trouble word finding abnormal speech slurred speech, numbness tingling around the mouth  Constitutional:  No weight loss, night sweats, Fevers, chills,  weight loss  HEENT:  No headaches, Difficulty swallowing,Tooth/dental problems,Sore throat,  No sneezing, itching, ear ache, nasal congestion, post nasal drip,  Cardio-vascular:  No chest pain, Orthopnea, PND, anasarca, dizziness, palpitations.no Bilateral lower extremity swelling  GI:  No heartburn, indigestion, abdominal pain, nausea, vomiting, diarrhea, change in bowel habits, loss of appetite, melena, blood in  stool, hematemesis Resp:  no shortness of breath at rest. No dyspnea on exertion, No excess mucus, no productive cough, No non-productive cough, No coughing up of blood.No change in color of mucus.No wheezing. Skin:  no rash or lesions. No jaundice GU:  no dysuria, change in color of urine, no urgency or frequency. No straining to urinate.  No flank pain.  Musculoskeletal:  No joint pain or no joint swelling. No decreased range of motion. No back pain.  Psych:  No change in mood or affect. No depression or anxiety. No memory loss.  Neuro: no localizing neurological complaints, no tingling, no weakness, no double vision, no gait abnormality, no no confusion  All systems reviewed and apart from [place] all are negative  Past Medical History:   Past Medical History:  Diagnosis Date  . Abdominal aortic ectasia ([place])   . Arthritis   . Cervical spondylosis   . Colon polyps   . CVA (cerebral infarction) 2010  . Diabetes mellitus   . Dizziness   . DVT (deep venous thrombosis) ([facility])    bilateral  . ED (erectile dysfunction)   . Foot drop   .  H/O Bell's palsy   . Hyperlipidemia   . Hypertension   . Obesity   . OSA on CPAP   . Raynauds syndrome   . Restrictive lung disease   . Spinal stenosis   . Stroke ([place])   . Unsteady gait       Past Surgical History:  Procedure Laterality Date  . CARPAL TUNNEL RELEASE Bilateral   . CERVICAL DISCECTOMY  2004  . LUMBAR DISC SURGERY     x6  . POSTERIOR CERVICAL FUSION/FORAMINOTOMY N/A 06/27/2016   Procedure: POSTERIOR CERVICAL FUSION C2-C5; C2-C4 Decompression;  Surgeon: Melina Schools, MD;  Location: [place];  Service: Orthopedics;  Laterality: N/A;    Social History:  Ambulatory cane,      reports that he quit smoking about 8 years ago. His smoking use included cigarettes. He has never used smokeless tobacco. He reports that he does not drink alcohol or use drugs.   Family History:   Family History  Problem Relation Age of Onset  .  Multiple sclerosis Sister   . Dementia Mother   . Diabetes Mother   . Lung cancer Father   . Sleep apnea Brother   . Hypertension Brother   . Colon cancer Neg Hx   . Stomach cancer Neg Hx     Allergies: No Known Allergies   Prior to Admission medications   Medication Sig Start Date End Date Taking? Authorizing Provider  ACCU-CHEK GUIDE test strip USE TO TEST BLOOD SUGAR DAILY 05/14/18   [provider]  atorvastatin (LIPITOR) 40 MG tablet Take 0.5 tablets (20 mg total) by mouth at bedtime. 07/20/16   Angiulli, Lavon Paganini, PA-C  blood glucose meter kit and supplies KIT Dispense based on patient and insurance preference. Use up to four times daily as directed. (FOR ICD-9 250.00, 250.01). 08/01/16   Meredith Staggers, MD  clopidogrel (PLAVIX) 75 MG tablet Take 1 tablet by mouth daily. 03/15/17   [provider]  cyclobenzaprine (FLEXERIL) 10 MG tablet Take 1 tablet (10 mg total) by mouth 2 (two) times daily as needed for muscle spasms. Patient taking differently: Take 10 mg by mouth every 8 (eight) hours as needed for muscle spasms.  08/28/16   Elnora Morrison, MD  metFORMIN (GLUCOPHAGE) 1000 MG tablet Take 1,000 mg by mouth 2 (two) times daily with a meal.    [provider]  omeprazole (PRILOSEC) 20 MG capsule Take 1 capsule (20 mg total) by mouth daily. 11/19/18   Irene Shipper, MD  oxyCODONE-acetaminophen (PERCOCET/ROXICET) 5-325 MG tablet Take 1 tablet by mouth 3 (three) times daily as needed. 03/13/17   [provider]  polyethylene glycol (MIRALAX / GLYCOLAX) packet Take 17 g by mouth 2 (two) times daily. 07/04/16   Mariel Aloe, MD  sildenafil (REVATIO) 20 MG tablet sildenafil (antihypertensive) 20 mg tablet    [provider]  tamsulosin (FLOMAX) 0.4 MG CAPS capsule Take 0.4 mg by mouth daily. 02/20/17   [provider]  valsartan-hydrochlorothiazide (DIOVAN-HCT) 160-12.5 MG tablet Take 1 tablet by mouth daily. 02/18/17   [provider]   Physical Exam: Blood pressure (!) 161/89, pulse 71, temperature 98.4 F (36.9 C), temperature source Oral, resp. rate 20, SpO2 100 %. 1. General:  in No  Acute distress   well -appearing 2. Psychological: Alert and   Oriented 3. Head/ENT:    Dry Mucous Membranes  Head Non traumatic, neck supple                            Poor Dentition 4. SKIN:  decreased Skin turgor,  Skin clean Dry and intact no rash 5. Heart: Regular rate and rhythm no  Murmur, no Rub or gallop 6. Lungs:  no wheezes or crackles   7. Abdomen: Soft, non-tender, Non distended   obese  bowel sounds present 8. Lower extremities: no clubbing, cyanosis, no  edema 9. Neurologically    all 4 extremities cranial nerves II through XII intact, except for right side foot drop 10. MSK: Normal range of motion  All other LABS:     Recent Labs  Lab 06/04/19 1704 06/04/19 1725  WBC 6.2  --   NEUTROABS 3.1  --   HGB 12.2* 12.6*  HCT 37.6* 37.0*  MCV 90.8  --   PLT 350  --      Recent Labs  Lab 06/04/19 1704 06/04/19 1725  NA 138 136  K 4.4 5.5*  CL 101 102  CO2 24  --   GLUCOSE 126* 126*  BUN 18 27*  CREATININE 1.28* 1.30*  CALCIUM 9.7  --      Recent Labs  Lab 06/04/19 1704  AST 42*  ALT 33  ALKPHOS 318*  BILITOT 0.7  PROT 8.3*  ALBUMIN 3.8       Cultures: No results found for: SDES, SPECREQUEST, CULT, REPTSTATUS   Radiological Exams on Admission: CT HEAD WO CONTRAST  Result Date: 06/04/2019 CLINICAL DATA:  Slurred speech, memory loss, lack of concentration, symptoms since 9 a.m. today EXAM: CT HEAD WITHOUT CONTRAST TECHNIQUE: Contiguous axial images were obtained from the base of the skull through the vertex without intravenous contrast. COMPARISON:  06/25/2016 FINDINGS: Brain: Dilated perivascular space versus chronic lacunar infarct left thalamus unchanged. No acute infarct or hemorrhage. Lateral ventricles and remaining midline structures are unremarkable.  No acute extra-axial fluid collections. No mass effect. Vascular: No hyperdense vessel or unexpected calcification. Skull: Normal. Negative for fracture or focal lesion. Sinuses/Orbits: No acute finding. Other: None IMPRESSION: 1. No acute intracranial process.  The Electronically Signed   By: Randa Ngo M.D.   On: 06/04/2019 18:23   MR BRAIN WO CONTRAST  Addendum Date: 06/04/2019   ADDENDUM REPORT: 06/04/2019 23:41 ADDENDUM: I discussed the case with Dr. Rory Percy at 11:30 p.m. on 06/04/2019. There are actually a few punctate foci of hyperintensity on diffusion-weighted imaging within the subcortical white matter of both frontal lobes (series 3 images 36, 39 and 42). There is no clear correlate on the ADC map. The appearance is most compatible with recent, but subacute, punctate infarcts. Evaluation for possible embolic sources may be appropriate. Electronically Signed   By: Ulyses Jarred M.D.   On: 06/04/2019 23:41   Result Date: 06/04/2019 CLINICAL DATA:  Slurred speech and perioral numbness EXAM: MRI HEAD WITHOUT CONTRAST TECHNIQUE: Multiplanar, multiecho pulse sequences of the brain and surrounding structures were obtained without intravenous contrast. COMPARISON:  Brain MRI 06/25/2016 FINDINGS: Brain: No acute infarct, acute hemorrhage or extra-axial collection. Multifocal white matter hyperintensity, most commonly due to chronic ischemic microangiopathy. There is generalized atrophy without lobar predilection. No chronic microhemorrhage. Normal midline structures. Vascular: Normal flow voids. Skull and upper cervical spine: Normal marrow signal. Sinuses/Orbits: Negative. Other: None. IMPRESSION: 1. No acute intracranial abnormality. 2. Generalized atrophy and findings of chronic ischemic microangiopathy. Electronically Signed: By: Ulyses Jarred M.D. On:  06/04/2019 23:15    Chart has been reviewed    Assessment/Plan   69 y.o. male with medical history significant of HLD, OSA on CPAP, HTN   smoking, hyperlipidemia, type 2 diabetes, degenerative back disease, s/p 7 back surgeries, and stroke (left thalamic on MRI), history of bilateral DVT history of foot drop and Bell's palsy Admitted for CVA  Present on Admission: . CVA (cerebral vascular accident) ([place]) -  - will admit based on TIA/CVA protocol,        Monitor on Tele       MRA/MRI   Resulted - showing multiple acute ischemic CVA possible embolic?       CTA ordred       Echo to evaluate for possible embolic source,       May benefit from loop recorder no evidence of A. Fib        obtain cardiac enzymes,  ECG,   Lipid panel, TSH.        Order PT/OT evaluation.         passed swallow eval         Will make sure patient is on antiplatelet  Plavix          Allow permissive Hypertension keep BP <220/120        Neurology consulted Have seen pt in ER   . DM (diabetes mellitus), type 2 with renal complications (HCC) -  - Order Sensitive   SSI   -  check TSH and HgA1C  - Hold by mouth medications      hyperlipidemia (HCC) check lipid panel patient on Lipitor  . Essential hypertension allow permissive hypertension for tonight and restart medications between 48 to 72 hours after last time seen normal    . Spinal stenosis in cervical region chronic stable patient has chronic right-sided foot drop     OSA -restart CPAP if Covid negative  Other plan as per orders.  DVT prophylaxis:  SCD      Code Status:  FULL CODE as per patient   I had personally discussed CODE STATUS with patient    Family Communication:   Family not at  Bedside    Disposition Plan:      To home once workup is complete and patient is stable                     Would benefit from PT/OT eval prior to DC  Ordered                     Consults called: neurology aware  Admission status:  ED Disposition    ED Disposition Condition [place]: [place] [[id number]]  Level of Care: Telemetry Medical [104]  I expect  the patient will be discharged within 24 hours: No (not a candidate for 5C-Observation unit)  Covid Evaluation: Symptomatic Person Under Investigation (PUI)  Diagnosis: CVA (cerebral vascular accident) [facility]) [[id number]]  Admitting Physician: Toy Baker [[id number]]  Attending Physician: Toy Baker [[id number]]  Bed request comments: neuro        Obs      Level of care    tele   indefinitely please discontinue once patient no longer qualifies   Precautions: admitted as  PUI    If Covid PCR is negative  - please DC precautions    PPE: Used by the provider:   P100  eye Goggles,  Gloves    Jaylen Claude  06/04/2019, 12:44 AM    [facility]     after 2 AM please page floor coverage PA If 7AM-7PM, please contact the day team taking care of the patient using [email or web address]   Patient was evaluated in the context of the global COVID-19 pandemic, which necessitated consideration that the patient might be at risk for infection with the SARS-CoV-2 virus that causes COVID-19. Institutional protocols and algorithms that pertain to the evaluation of patients at risk for COVID-19 are in a state of rapid change based on information released by regulatory bodies including the CDC and federal and state organizations. These policies and algorithms were followed during the patient's care.

## 2019-06-05 ENCOUNTER — Other Ambulatory Visit: Payer: Self-pay

## 2019-06-05 ENCOUNTER — Observation Stay (HOSPITAL_COMMUNITY): Payer: Medicare Other

## 2019-06-05 ENCOUNTER — Observation Stay (HOSPITAL_BASED_OUTPATIENT_CLINIC_OR_DEPARTMENT_OTHER): Payer: Medicare Other

## 2019-06-05 ENCOUNTER — Encounter (HOSPITAL_COMMUNITY): Payer: Self-pay | Admitting: Internal Medicine

## 2019-06-05 DIAGNOSIS — I63232 Cerebral infarction due to unspecified occlusion or stenosis of left carotid arteries: Secondary | ICD-10-CM | POA: Diagnosis not present

## 2019-06-05 DIAGNOSIS — I7300 Raynaud's syndrome without gangrene: Secondary | ICD-10-CM | POA: Diagnosis present

## 2019-06-05 DIAGNOSIS — I639 Cerebral infarction, unspecified: Secondary | ICD-10-CM | POA: Diagnosis present

## 2019-06-05 DIAGNOSIS — Z20822 Contact with and (suspected) exposure to covid-19: Secondary | ICD-10-CM | POA: Diagnosis present

## 2019-06-05 DIAGNOSIS — I10 Essential (primary) hypertension: Secondary | ICD-10-CM

## 2019-06-05 DIAGNOSIS — N400 Enlarged prostate without lower urinary tract symptoms: Secondary | ICD-10-CM | POA: Diagnosis present

## 2019-06-05 DIAGNOSIS — M4802 Spinal stenosis, cervical region: Secondary | ICD-10-CM | POA: Diagnosis present

## 2019-06-05 DIAGNOSIS — M47812 Spondylosis without myelopathy or radiculopathy, cervical region: Secondary | ICD-10-CM | POA: Diagnosis present

## 2019-06-05 DIAGNOSIS — Z7902 Long term (current) use of antithrombotics/antiplatelets: Secondary | ICD-10-CM | POA: Diagnosis not present

## 2019-06-05 DIAGNOSIS — R4781 Slurred speech: Secondary | ICD-10-CM | POA: Diagnosis not present

## 2019-06-05 DIAGNOSIS — Z8249 Family history of ischemic heart disease and other diseases of the circulatory system: Secondary | ICD-10-CM | POA: Diagnosis not present

## 2019-06-05 DIAGNOSIS — Z86010 Personal history of colonic polyps: Secondary | ICD-10-CM | POA: Diagnosis not present

## 2019-06-05 DIAGNOSIS — I6389 Other cerebral infarction: Secondary | ICD-10-CM | POA: Diagnosis not present

## 2019-06-05 DIAGNOSIS — F17290 Nicotine dependence, other tobacco product, uncomplicated: Secondary | ICD-10-CM | POA: Diagnosis present

## 2019-06-05 DIAGNOSIS — Z86718 Personal history of other venous thrombosis and embolism: Secondary | ICD-10-CM | POA: Diagnosis not present

## 2019-06-05 DIAGNOSIS — I63413 Cerebral infarction due to embolism of bilateral middle cerebral arteries: Secondary | ICD-10-CM | POA: Diagnosis not present

## 2019-06-05 DIAGNOSIS — Z79899 Other long term (current) drug therapy: Secondary | ICD-10-CM | POA: Diagnosis not present

## 2019-06-05 DIAGNOSIS — N182 Chronic kidney disease, stage 2 (mild): Secondary | ICD-10-CM | POA: Diagnosis present

## 2019-06-05 DIAGNOSIS — I129 Hypertensive chronic kidney disease with stage 1 through stage 4 chronic kidney disease, or unspecified chronic kidney disease: Secondary | ICD-10-CM | POA: Diagnosis present

## 2019-06-05 DIAGNOSIS — R402252 Coma scale, best verbal response, oriented, at arrival to emergency department: Secondary | ICD-10-CM | POA: Diagnosis present

## 2019-06-05 DIAGNOSIS — R402362 Coma scale, best motor response, obeys commands, at arrival to emergency department: Secondary | ICD-10-CM | POA: Diagnosis present

## 2019-06-05 DIAGNOSIS — R402142 Coma scale, eyes open, spontaneous, at arrival to emergency department: Secondary | ICD-10-CM | POA: Diagnosis present

## 2019-06-05 DIAGNOSIS — I69320 Aphasia following cerebral infarction: Secondary | ICD-10-CM | POA: Diagnosis not present

## 2019-06-05 DIAGNOSIS — E1122 Type 2 diabetes mellitus with diabetic chronic kidney disease: Secondary | ICD-10-CM | POA: Diagnosis present

## 2019-06-05 DIAGNOSIS — N529 Male erectile dysfunction, unspecified: Secondary | ICD-10-CM | POA: Diagnosis present

## 2019-06-05 DIAGNOSIS — E785 Hyperlipidemia, unspecified: Secondary | ICD-10-CM | POA: Diagnosis present

## 2019-06-05 DIAGNOSIS — R29700 NIHSS score 0: Secondary | ICD-10-CM | POA: Diagnosis present

## 2019-06-05 DIAGNOSIS — M21371 Foot drop, right foot: Secondary | ICD-10-CM | POA: Diagnosis present

## 2019-06-05 DIAGNOSIS — G4733 Obstructive sleep apnea (adult) (pediatric): Secondary | ICD-10-CM | POA: Diagnosis present

## 2019-06-05 DIAGNOSIS — I63419 Cerebral infarction due to embolism of unspecified middle cerebral artery: Secondary | ICD-10-CM | POA: Diagnosis present

## 2019-06-05 LAB — URINALYSIS, ROUTINE W REFLEX MICROSCOPIC
Bilirubin Urine: NEGATIVE
Glucose, UA: NEGATIVE mg/dL
Hgb urine dipstick: NEGATIVE
Ketones, ur: 5 mg/dL — AB
Leukocytes,Ua: NEGATIVE
Nitrite: NEGATIVE
Protein, ur: NEGATIVE mg/dL
Specific Gravity, Urine: 1.013 (ref 1.005–1.030)
pH: 6 (ref 5.0–8.0)

## 2019-06-05 LAB — GLUCOSE, CAPILLARY
Glucose-Capillary: 193 mg/dL — ABNORMAL HIGH (ref 70–99)
Glucose-Capillary: 113 mg/dL — ABNORMAL HIGH (ref 70–99)
Glucose-Capillary: 195 mg/dL — ABNORMAL HIGH (ref 70–99)
Glucose-Capillary: 163 mg/dL — ABNORMAL HIGH (ref 70–99)
Glucose-Capillary: 202 mg/dL — ABNORMAL HIGH (ref 70–99)
Glucose-Capillary: 153 mg/dL — ABNORMAL HIGH (ref 70–99)

## 2019-06-05 LAB — RESPIRATORY PANEL BY RT PCR (FLU A&B, COVID)
Influenza A by PCR: NEGATIVE
Influenza B by PCR: NEGATIVE
SARS Coronavirus 2 by RT PCR: NEGATIVE

## 2019-06-05 LAB — LIPID PANEL
Cholesterol: 112 mg/dL (ref 0–200)
HDL: 38 mg/dL — ABNORMAL LOW (ref >40)
LDL Cholesterol: 56 mg/dL (ref 0–99)
Total CHOL/HDL Ratio: 2.9 RATIO
Triglycerides: 92 mg/dL (ref <150)
VLDL: 18 mg/dL (ref 0–40)

## 2019-06-05 LAB — ECHOCARDIOGRAM COMPLETE

## 2019-06-05 LAB — HIV ANTIBODY (ROUTINE TESTING W REFLEX): HIV Screen 4th Generation wRfx: NONREACTIVE

## 2019-06-05 MED ORDER — STUDY - PACIFIC (STROKE) - BAY 2433334 (BLUE BOTTLE) 5, 15, 25MG OR PLACEBO TABLET (PI-SETHI)
1.0000 | ORAL_TABLET | Freq: Every day | ORAL | Status: DC
  Administered 2019-06-05 – 2019-06-06 (×2): 1 via ORAL
  Filled 2019-06-05 (×2): qty 1

## 2019-06-05 MED ORDER — CLOPIDOGREL BISULFATE 75 MG PO TABS
75.0000 mg | ORAL_TABLET | Freq: Every day | ORAL | Status: DC
  Administered 2019-06-05 – 2019-06-06 (×2): 75 mg via ORAL
  Filled 2019-06-05 (×2): qty 1

## 2019-06-05 MED ORDER — INSULIN ASPART 100 UNIT/ML ~~LOC~~ SOLN
0.0000 [IU] | Freq: Three times a day (TID) | SUBCUTANEOUS | Status: DC
  Administered 2019-06-05: 12:00:00 2 [IU] via SUBCUTANEOUS
  Administered 2019-06-05: 3 [IU] via SUBCUTANEOUS
  Administered 2019-06-06: 08:00:00 1 [IU] via SUBCUTANEOUS
  Administered 2019-06-06: 13:00:00 2 [IU] via SUBCUTANEOUS

## 2019-06-05 MED ORDER — STUDY - PACIFIC (STROKE) - BAY 2433334 (GREEN BOTTLE) 5, 15, 25MG OR PLACEBO TABLET (PI-SETHI)
1.0000 | ORAL_TABLET | Freq: Every day | ORAL | Status: DC
  Filled 2019-06-05: qty 1

## 2019-06-05 MED ORDER — CYCLOBENZAPRINE HCL 10 MG PO TABS: 10.0000 mg | ORAL_TABLET | Freq: Three times a day (TID) | ORAL | Status: DC | PRN

## 2019-06-05 MED ORDER — PANTOPRAZOLE SODIUM 40 MG PO TBEC
40.0000 mg | DELAYED_RELEASE_TABLET | Freq: Every day | ORAL | Status: DC
  Administered 2019-06-05 – 2019-06-06 (×2): 40 mg via ORAL
  Filled 2019-06-05 (×2): qty 1

## 2019-06-05 MED ORDER — ACETAMINOPHEN 325 MG PO TABS
650.0000 mg | ORAL_TABLET | ORAL | Status: DC | PRN
  Administered 2019-06-05: 19:00:00 650 mg via ORAL
  Filled 2019-06-05 (×2): qty 2

## 2019-06-05 MED ORDER — STUDY - PACIFIC (STROKE) - BAY 2433334 (BLUE BOTTLE) 5, 15, 25MG OR PLACEBO TABLET (PI-SETHI)
1.0000 | ORAL_TABLET | Freq: Every day | ORAL | Status: DC
  Filled 2019-06-05: qty 1

## 2019-06-05 MED ORDER — INSULIN ASPART 100 UNIT/ML ~~LOC~~ SOLN: 0.0000 [IU] | Freq: Every day | SUBCUTANEOUS | Status: DC

## 2019-06-05 MED ORDER — STROKE: EARLY STAGES OF RECOVERY BOOK
Freq: Once | Status: AC
  Administered 2019-06-05: 02:00:00 1
  Filled 2019-06-05: qty 1

## 2019-06-05 MED ORDER — STUDY - PACIFIC (STROKE) - BAY 2433334 (GREEN BOTTLE) 5, 15, 25MG OR PLACEBO TABLET (PI-SETHI)
1.0000 | ORAL_TABLET | Freq: Every day | ORAL | Status: DC
  Administered 2019-06-05 – 2019-06-06 (×2): 1 via ORAL
  Filled 2019-06-05 (×2): qty 1

## 2019-06-05 MED ORDER — SODIUM CHLORIDE 0.9 % IV SOLN: INTRAVENOUS | Status: DC

## 2019-06-05 MED ORDER — ACETAMINOPHEN 650 MG RE SUPP: 650.0000 mg | RECTAL | Status: DC | PRN

## 2019-06-05 MED ORDER — ATORVASTATIN CALCIUM 40 MG PO TABS
40.0000 mg | ORAL_TABLET | Freq: Every day | ORAL | Status: DC
  Administered 2019-06-05 (×2): 40 mg via ORAL
  Filled 2019-06-05 (×2): qty 1

## 2019-06-05 MED ORDER — ACETAMINOPHEN 160 MG/5ML PO SOLN: 650.0000 mg | ORAL | Status: DC | PRN

## 2019-06-05 MED ORDER — MECLIZINE HCL 12.5 MG PO TABS: 25.0000 mg | ORAL_TABLET | Freq: Three times a day (TID) | ORAL | Status: DC | PRN

## 2019-06-05 MED ORDER — IOHEXOL 350 MG/ML SOLN
80.0000 mL | Freq: Once | INTRAVENOUS | Status: AC | PRN
  Administered 2019-06-05: 01:00:00 80 mL via INTRAVENOUS

## 2019-06-05 MED ORDER — SENNOSIDES-DOCUSATE SODIUM 8.6-50 MG PO TABS: 1.0000 | ORAL_TABLET | Freq: Every evening | ORAL | Status: DC | PRN

## 2019-06-05 NOTE — Progress Notes (Signed)
  Echocardiogram 2D Echocardiogram has been performed.  Rodney Crane 06/05/2019, 9:37 AM

## 2019-06-05 NOTE — Progress Notes (Signed)
Progress Note    Rodney Crane  [id number] DOB: 31-Jan-1951  DOA: 06/04/2019 PCP: Marton Redwood, MD    Brief Narrative:   Chief complaint: speech difficulty  Medical records reviewed and are as summarized below:  Rodney Crane is an 69 y.o. male with a past medical history that includes stroke with no residual deficits, hypertension, hyperlipidemia, sleep apnea, obesity, tobacco use [place] emergency department February 9 with chief complaint of speech difficulty.  Valuated by neurology in the emergency department and concern for stroke/likely cardioembolic and recommend admission for work-up.  Assessment/Plan:   Principal Problem:   CVA (cerebrovascular accident) ([place]) Active Problems:   DM (diabetes mellitus), type 2 with renal complications ([place])   Spinal stenosis in cervical region   Essential hypertension   OSA on CPAP   #1.  Acute CVA.  MRI with subacute frontal lobe MCA infarcts, embolic secondary to unknown source.  Echo with an EF of 65 to 70%, no source of embolus, LDL 56, lower extremity Doppler with no DVT.  Carotid Doppler results pending.  Evaluated by PT and OT who recommend no further therapies needed -Continue Plavix and statin -Follow carotid Doppler results -Further recommendations per neuro  #2.  Diabetes type 2.  IMA globin A1c in progress.  Serum glucose 126 on admission.  Home medications include Metformin -Hold Metformin for now -Sliding scale insulin for optimal control  #3.  Spinal stenosis.  Appears stable at baseline. -Continue home meds  #4.  Hypertension.  Blood pressure on the high end of normal.  Home medications include hydrochlorothiazide/valsartan. -Continue home meds  Family Communication/Anticipated D/C date and plan/Code Status   DVT prophylaxis: Lovenox ordered. Code Status: Full Code.  Family Communication: patient Disposition Plan: home when ready   Medical Consultants:    [facility]   Anti-Infectives:     None  Subjective:   Awake alert reports symptoms mostly subsided.  Tracheally his speech is "much better" but complains of the back of his tongue feeling numb  Objective:    Vitals:   06/05/19 0600 06/05/19 0827 06/05/19 1045 06/05/19 1149  BP: (!) 152/91 (!) 149/100 (!) 154/94 (!) 149/100  Pulse: 68 86 80 78  Resp: 20 18 18 17   Temp: 98.6 F (37 C) 98.6 F (37 C) 98.6 F (37 C) 98.6 F (37 C)  TempSrc: Oral Oral Oral Oral  SpO2: 100% 100% 100% 100%   No intake or output data in the 24 hours ending 06/05/19 1357 There were no vitals filed for this visit.  Exam: General: Obese alert calm cooperative no acute distress CV: Regular rate and rhythm no murmur gallop or rub no lower extremity edema Respiratory: No increased work of breathing breath sounds clear bilaterally I hear no rhonchi no wheezes no crackles Abdomen: Nondistended soft positive bowel sounds throughout nontender to palpation no guarding or rebounding Musculoskeletal: Joints without swelling/erythema moves all extremities spontaneously Neuro: Alert and oriented x3 speech clear facial symmetry bilateral grip 5 out of 5 lower extremity strength 5 out of 5 bilaterally  Data Reviewed:   I have personally reviewed following labs and imaging studies:  Labs: Labs show the following:   Basic Metabolic Panel: Recent Labs  Lab 06/04/19 1704 06/04/19 1725  NA 138 136  K 4.4 5.5*  CL 101 102  CO2 24  --   GLUCOSE 126* 126*  BUN 18 27*  CREATININE 1.28* 1.30*  CALCIUM 9.7  --    GFR CrCl cannot be calculated (Unknown ideal weight.).  Liver Function Tests: Recent Labs  Lab 06/04/19 1704  AST 42*  ALT 33  ALKPHOS 318*  BILITOT 0.7  PROT 8.3*  ALBUMIN 3.8   No results for input(s): LIPASE, AMYLASE in the last 168 hours. No results for input(s): AMMONIA in the last 168 hours. Coagulation profile Recent Labs  Lab 06/04/19 1704  INR 1.0    CBC: Recent Labs  Lab 06/04/19 1704 06/04/19 1725   WBC 6.2  --   NEUTROABS 3.1  --   HGB 12.2* 12.6*  HCT 37.6* 37.0*  MCV 90.8  --   PLT 350  --    Cardiac Enzymes: No results for input(s): CKTOTAL, CKMB, CKMBINDEX, TROPONINI in the last 168 hours. BNP (last 3 results) No results for input(s): PROBNP in the last 8760 hours. CBG: Recent Labs  Lab 06/05/19 0205 06/05/19 0604 06/05/19 0820 06/05/19 1150  GLUCAP 193* 113* 195* 163*   D-Dimer: No results for input(s): DDIMER in the last 72 hours. Hgb A1c: No results for input(s): HGBA1C in the last 72 hours. Lipid Profile: Recent Labs    06/05/19 0325  CHOL 112  HDL 38*  LDLCALC 56  TRIG 92  CHOLHDL 2.9   Thyroid function studies: No results for input(s): TSH, T4TOTAL, T3FREE, THYROIDAB in the last 72 hours.  Invalid input(s): FREET3 Anemia work up: No results for input(s): VITAMINB12, FOLATE, FERRITIN, TIBC, IRON, RETICCTPCT in the last 72 hours. Sepsis Labs: Recent Labs  Lab 06/04/19 1704  WBC 6.2    Microbiology Recent Results (from the past 240 hour(s))  Respiratory Panel by RT PCR (Flu A&B, Covid) - Nasopharyngeal Swab     Status: None   Collection Time: 06/05/19 12:37 AM   Specimen: Nasopharyngeal Swab  Result Value Ref Range Status   SARS Coronavirus 2 by RT PCR NEGATIVE NEGATIVE Final    Comment: (NOTE) SARS-CoV-2 target nucleic acids are NOT DETECTED. The SARS-CoV-2 RNA is generally detectable in upper respiratoy specimens during the acute phase of infection. The lowest concentration of SARS-CoV-2 viral copies this assay can detect is 131 copies/mL. A negative result does not preclude SARS-Cov-2 infection and should not be used as the sole basis for treatment or other patient management decisions. A negative result may occur with  improper specimen collection/handling, submission of specimen other than nasopharyngeal swab, presence of viral mutation(s) within the areas targeted by this assay, and inadequate number of viral copies (<131  copies/mL). A negative result must be combined with clinical observations, patient history, and epidemiological information. The expected result is Negative. Fact Sheet for Patients:  PinkCheek.be Fact Sheet for Healthcare Providers:  GravelBags.it This test is not yet ap proved or cleared by the [place] FDA and  has been authorized for detection and/or diagnosis of SARS-CoV-2 by FDA under an Emergency Use Authorization (EUA). This EUA will remain  in effect (meaning this test can be used) for the duration of the COVID-19 declaration under Section 564(b)(1) of the Act, 21 U.S.C. section 360bbb-3(b)(1), unless the authorization is terminated or revoked sooner.    Influenza A by PCR NEGATIVE NEGATIVE Final   Influenza B by PCR NEGATIVE NEGATIVE Final    Comment: (NOTE) The Xpert Xpress SARS-CoV-2/FLU/RSV assay is intended as an aid in  the diagnosis of influenza from Nasopharyngeal swab specimens and  should not be used as a sole basis for treatment. Nasal washings and  aspirates are unacceptable for Xpert Xpress SARS-CoV-2/FLU/RSV  testing. Fact Sheet for Patients: PinkCheek.be Fact Sheet for Healthcare Providers: GravelBags.it  This test is not yet approved or cleared by the [place] and  has been authorized for detection and/or diagnosis of SARS-CoV-2 by  FDA under an Emergency Use Authorization (EUA). This EUA will remain  in effect (meaning this test can be used) for the duration of the  Covid-19 declaration under Section 564(b)(1) of the Act, 21  U.S.C. section 360bbb-3(b)(1), unless the authorization is  terminated or revoked. Performed at [facility], [place] [address] [place], [place] [address]     Procedures and diagnostic studies:  CT Angio Head W or Wo Contrast  Result Date: 06/05/2019 CLINICAL DATA:  Stroke follow-up EXAM: CT  ANGIOGRAPHY HEAD AND NECK TECHNIQUE: Multidetector CT imaging of the head and neck was performed using the standard protocol during bolus administration of intravenous contrast. Multiplanar CT image reconstructions and MIPs were obtained to evaluate the vascular anatomy. Carotid stenosis measurements (when applicable) are obtained utilizing NASCET criteria, using the distal internal carotid diameter as the denominator. CONTRAST:  53mL OMNIPAQUE IOHEXOL 350 MG/ML SOLN COMPARISON:  Brain MRI 06/04/2019 FINDINGS: CTA NECK FINDINGS SKELETON: C4-7 anterior spinal fusion and C2-6 posterior instrumented fusion. OTHER NECK: Normal pharynx, larynx and major salivary glands. No cervical lymphadenopathy. Unremarkable thyroid gland. UPPER CHEST: No pneumothorax or pleural effusion. No nodules or masses. AORTIC ARCH: There is no calcific atherosclerosis of the aortic arch. There is no aneurysm, dissection or hemodynamically significant stenosis of the visualized portion of the aorta. Conventional 3 vessel aortic branching pattern. The visualized proximal subclavian arteries are widely patent. RIGHT CAROTID SYSTEM: Normal without aneurysm, dissection or stenosis. LEFT CAROTID SYSTEM: No dissection, occlusion or aneurysm. Mild atherosclerotic calcification at the carotid bifurcation without hemodynamically significant stenosis. VERTEBRAL ARTERIES: Left dominant configuration. Both origins are clearly patent. There is no dissection, occlusion or flow-limiting stenosis to the skull base (V1-V3 segments). CTA HEAD FINDINGS POSTERIOR CIRCULATION: --Vertebral arteries: Minimal atherosclerotic calcification in the left V4 segment. --Posterior inferior cerebellar arteries (PICA): Patent origins from the vertebral arteries. --Anterior inferior cerebellar arteries (AICA): Patent origins from the basilar artery. --Basilar artery: Normal. --Superior cerebellar arteries: Normal. --Posterior cerebral arteries: Normal. Both originate from the  basilar artery. Posterior communicating arteries (p-comm) are diminutive or absent. ANTERIOR CIRCULATION: --Intracranial internal carotid arteries: Normal. --Anterior cerebral arteries (ACA): Normal. Both A1 segments are present. Patent anterior communicating artery (a-comm). --Middle cerebral arteries (MCA): Normal. VENOUS SINUSES: As permitted by contrast timing, patent. ANATOMIC VARIANTS: None Review of the MIP images confirms the above findings. IMPRESSION: 1. No emergent large vessel occlusion or hemodynamically significant stenosis of the head or neck. 2. Mild left carotid bifurcation atherosclerotic calcification without hemodynamically significant stenosis. Electronically Signed   By: Ulyses Jarred M.D.   On: 06/05/2019 01:44   DG Chest 2 View  Result Date: 06/05/2019 CLINICAL DATA:  Stroke EXAM: CHEST - 2 VIEW COMPARISON:  06/29/2016 FINDINGS: Normal heart size and stable mediastinal contours. Chronic interstitial coarsening at the bases with interstitial opacity seen since 2012 chest CT. There is no edema, consolidation, effusion, or pneumothorax. Cervical fusion hardware. Thoracic spondylosis with bridging osteophytes. IMPRESSION: Chronic lung disease without acute superimposed finding. Electronically Signed   By: Monte Fantasia M.D.   On: 06/05/2019 07:02   CT HEAD WO CONTRAST  Result Date: 06/04/2019 CLINICAL DATA:  Slurred speech, memory loss, lack of concentration, symptoms since 9 a.m. today EXAM: CT HEAD WITHOUT CONTRAST TECHNIQUE: Contiguous axial images were obtained from the base of the skull through the vertex without intravenous contrast. COMPARISON:  06/25/2016 FINDINGS: Brain: Dilated perivascular space versus chronic lacunar infarct left thalamus unchanged. No acute infarct or hemorrhage. Lateral ventricles and remaining midline structures are unremarkable. No acute extra-axial fluid collections. No mass effect. Vascular: No hyperdense vessel or unexpected calcification. Skull:  Normal. Negative for fracture or focal lesion. Sinuses/Orbits: No acute finding. Other: None IMPRESSION: 1. No acute intracranial process.  The Electronically Signed   By: Randa Ngo M.D.   On: 06/04/2019 18:23   CT Angio Neck W and/or Wo Contrast  Result Date: 06/05/2019 CLINICAL DATA:  Stroke follow-up EXAM: CT ANGIOGRAPHY HEAD AND NECK TECHNIQUE: Multidetector CT imaging of the head and neck was performed using the standard protocol during bolus administration of intravenous contrast. Multiplanar CT image reconstructions and MIPs were obtained to evaluate the vascular anatomy. Carotid stenosis measurements (when applicable) are obtained utilizing NASCET criteria, using the distal internal carotid diameter as the denominator. CONTRAST:  43mL OMNIPAQUE IOHEXOL 350 MG/ML SOLN COMPARISON:  Brain MRI 06/04/2019 FINDINGS: CTA NECK FINDINGS SKELETON: C4-7 anterior spinal fusion and C2-6 posterior instrumented fusion. OTHER NECK: Normal pharynx, larynx and major salivary glands. No cervical lymphadenopathy. Unremarkable thyroid gland. UPPER CHEST: No pneumothorax or pleural effusion. No nodules or masses. AORTIC ARCH: There is no calcific atherosclerosis of the aortic arch. There is no aneurysm, dissection or hemodynamically significant stenosis of the visualized portion of the aorta. Conventional 3 vessel aortic branching pattern. The visualized proximal subclavian arteries are widely patent. RIGHT CAROTID SYSTEM: Normal without aneurysm, dissection or stenosis. LEFT CAROTID SYSTEM: No dissection, occlusion or aneurysm. Mild atherosclerotic calcification at the carotid bifurcation without hemodynamically significant stenosis. VERTEBRAL ARTERIES: Left dominant configuration. Both origins are clearly patent. There is no dissection, occlusion or flow-limiting stenosis to the skull base (V1-V3 segments). CTA HEAD FINDINGS POSTERIOR CIRCULATION: --Vertebral arteries: Minimal atherosclerotic calcification in the left  V4 segment. --Posterior inferior cerebellar arteries (PICA): Patent origins from the vertebral arteries. --Anterior inferior cerebellar arteries (AICA): Patent origins from the basilar artery. --Basilar artery: Normal. --Superior cerebellar arteries: Normal. --Posterior cerebral arteries: Normal. Both originate from the basilar artery. Posterior communicating arteries (p-comm) are diminutive or absent. ANTERIOR CIRCULATION: --Intracranial internal carotid arteries: Normal. --Anterior cerebral arteries (ACA): Normal. Both A1 segments are present. Patent anterior communicating artery (a-comm). --Middle cerebral arteries (MCA): Normal. VENOUS SINUSES: As permitted by contrast timing, patent. ANATOMIC VARIANTS: None Review of the MIP images confirms the above findings. IMPRESSION: 1. No emergent large vessel occlusion or hemodynamically significant stenosis of the head or neck. 2. Mild left carotid bifurcation atherosclerotic calcification without hemodynamically significant stenosis. Electronically Signed   By: Ulyses Jarred M.D.   On: 06/05/2019 01:44   MR BRAIN WO CONTRAST  Addendum Date: 06/04/2019   ADDENDUM REPORT: 06/04/2019 23:41 ADDENDUM: I discussed the case with Dr. Rory Percy at 11:30 p.m. on 06/04/2019. There are actually a few punctate foci of hyperintensity on diffusion-weighted imaging within the subcortical white matter of both frontal lobes (series 3 images 36, 39 and 42). There is no clear correlate on the ADC map. The appearance is most compatible with recent, but subacute, punctate infarcts. Evaluation for possible embolic sources may be appropriate. Electronically Signed   By: Ulyses Jarred M.D.   On: 06/04/2019 23:41   Result Date: 06/04/2019 CLINICAL DATA:  Slurred speech and perioral numbness EXAM: MRI HEAD WITHOUT CONTRAST TECHNIQUE: Multiplanar, multiecho pulse sequences of the brain and surrounding structures were obtained without intravenous contrast. COMPARISON:  Brain MRI 06/25/2016  FINDINGS: Brain: No acute infarct, acute hemorrhage or extra-axial  collection. Multifocal white matter hyperintensity, most commonly due to chronic ischemic microangiopathy. There is generalized atrophy without lobar predilection. No chronic microhemorrhage. Normal midline structures. Vascular: Normal flow voids. Skull and upper cervical spine: Normal marrow signal. Sinuses/Orbits: Negative. Other: None. IMPRESSION: 1. No acute intracranial abnormality. 2. Generalized atrophy and findings of chronic ischemic microangiopathy. Electronically Signed: By: Ulyses Jarred M.D. On: 06/04/2019 23:15   ECHOCARDIOGRAM COMPLETE  Result Date: 06/05/2019    ECHOCARDIOGRAM REPORT   Patient Name:   Vedanth Chieffo Date of Exam: 06/05/2019 Medical Rec #:  [id number]     Height:       75.0 in Accession #:    [id number]    Weight:       267.0 lb Date of Birth:  1950-12-15     BSA:          2.48 m Patient Age:    89 years      BP:           149/100 mmHg Patient Gender: M             HR:           86 bpm. Exam Location:  Inpatient Procedure: 2D Echo, Cardiac Doppler and Color Doppler Indications:    Stroke  History:        Patient has prior history of Echocardiogram examinations, most                 recent 06/26/2016. Abnormal ECG, Stroke, Signs/Symptoms:Dyspnea                 and Dizziness/Lightheadedness; Risk Factors:Diabetes,                 Hypertension, Sleep Apnea, Current Smoker and Dyslipidemia.  Sonographer:    Roseanna Rainbow RDCS Referring Phys: [place]  1. Left ventricular ejection fraction, by estimation, is 65 to 70%. The left ventricle has normal function. The left ventrical has no regional wall motion abnormalities. There is moderately increased left ventricular hypertrophy of the basal-septal segment. Left ventricular diastolic parameters were normal.  2. Right ventricular systolic function is normal. The right ventricular size is normal. Tricuspid regurgitation signal is inadequate for assessing  PA pressure.  3. The mitral valve is normal in structure and function. no evidence of mitral valve regurgitation. No evidence of mitral stenosis.  4. The aortic valve is normal in structure and function. Aortic valve regurgitation is not visualized. No aortic stenosis is present.  5. Aortic dilatation noted. 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  6. The inferior vena cava is normal in size with <50% respiratory variability, suggesting right atrial pressure of 8 mmHg. FINDINGS  Left Ventricle: Left ventricular ejection fraction, by estimation, is 65 to 70%. The left ventricle has normal function. The left ventricle has no regional wall motion abnormalities. The left ventricular internal cavity size was normal in size. There is  moderately increased. There is moderately increased left ventricular hypertrophy of the basal-septal segment. Left ventricular diastolic parameters were normal. Right Ventricle: The right ventricular size is normal. No increase in right ventricular wall thickness. Right ventricular systolic function is normal. Tricuspid regurgitation signal is inadequate for assessing PA pressure. Left Atrium: Left atrial size was normal in size. Right Atrium: Right atrial size was normal in size. Pericardium: There is no evidence of pericardial effusion. Mitral Valve: The mitral valve is normal in structure and function. Normal mobility of the mitral valve leaflets. No evidence  of mitral valve regurgitation. No evidence of mitral valve stenosis. Tricuspid Valve: The tricuspid valve is normal in structure. Tricuspid valve regurgitation is not demonstrated. No evidence of tricuspid stenosis. Aortic Valve: The aortic valve is normal in structure and function. Aortic valve regurgitation is not visualized. No aortic stenosis is present. Pulmonic Valve: The pulmonic valve was normal in structure. Pulmonic valve regurgitation is not visualized. No evidence of pulmonic stenosis. Aorta: The  aortic root is normal in size and structure and aortic dilatation noted. 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 Venous: The inferior vena cava is normal in size with less than 50% respiratory variability, suggesting right atrial pressure of 8 mmHg. The inferior vena cava and the hepatic vein show a normal flow pattern. IAS/Shunts: The interatrial septum appears to be lipomatous. No atrial level shunt detected by color flow Doppler.  LEFT VENTRICLE PLAX 2D LVIDd:         3.60 cm       Diastology LVIDs:         2.68 cm       LV e' lateral:   10.30 cm/s LV PW:         1.41 cm       LV E/e' lateral: 6.8 LV IVS:        1.43 cm       LV e' medial:    6.31 cm/s LVOT diam:     2.10 cm       LV E/e' medial:  11.1 LV SV:         55.07 ml LV SV Index:   10.93 LVOT Area:     3.46 cm  LV Volumes (MOD) LV area d, A2C:    22.00 cm LV area d, A4C:    22.50 cm LV area s, A2C:    9.95 cm LV area s, A4C:    11.50 cm LV major d, A2C:   7.59 cm LV major d, A4C:   6.67 cm LV major s, A2C:   5.96 cm LV major s, A4C:   5.21 cm LV vol d, MOD A2C: 54.1 ml LV vol d, MOD A4C: 62.1 ml LV vol s, MOD A2C: 14.2 ml LV vol s, MOD A4C: 21.7 ml LV SV MOD A2C:     39.9 ml LV SV MOD A4C:     62.1 ml LV SV MOD BP:      42.9 ml RIGHT VENTRICLE            IVC RV S prime:     9.25 cm/s  IVC diam: 1.81 cm TAPSE (M-mode): 1.9 cm LEFT ATRIUM           Index       RIGHT ATRIUM           Index LA diam:      2.80 cm 1.13 cm/m  RA Area:     12.10 cm LA Vol (A2C): 65.9 ml 26.56 ml/m RA Volume:   26.50 ml  10.68 ml/m LA Vol (A4C): 26.7 ml 10.76 ml/m  AORTIC VALVE LVOT Vmax:   93.10 cm/s LVOT Vmean:  56.100 cm/s LVOT VTI:    0.159 m  AORTA Ao Root diam: 3.70 cm Ao Asc diam:  4.10 cm MITRAL VALVE MV Area (PHT): 4.31 cm             SHUNTS MV Decel Time: 176 msec             Systemic VTI:  0.16 m MV E velocity: 69.80 cm/s 103 cm/s  Systemic Diam: 2.10 cm MV A velocity: 80.10 cm/s 70.3 cm/s MV E/A ratio:  0.87       1.5 Fransico Him  MD Electronically signed by Fransico Him MD Signature Date/Time: 06/05/2019/10:52:29 AM    Final    VAS [place] LOWER EXTREMITY VENOUS (DVT)  Result Date: 06/05/2019  Lower Venous DVTStudy Indications: Stroke.  Comparison Study: 08/28/2016- negative lower extremity venous duplex Performing Technologist: Maudry Mayhew MHA, RDMS, RVT, RDCS  Examination Guidelines: A complete evaluation includes B-mode imaging, spectral Doppler, color Doppler, and power Doppler as needed of all accessible portions of each vessel. Bilateral testing is considered an integral part of a complete examination. Limited examinations for reoccurring indications may be performed as noted. The reflux portion of the exam is performed with the patient in reverse Trendelenburg.  +---------+---------------+---------+-----------+----------+--------------+  RIGHT     Compressibility Phasicity Spontaneity Properties Thrombus Aging  +---------+---------------+---------+-----------+----------+--------------+  CFV       Full            Yes       Yes                                    +---------+---------------+---------+-----------+----------+--------------+  SFJ       Full                                                             +---------+---------------+---------+-----------+----------+--------------+  FV Prox   Full                                                             +---------+---------------+---------+-----------+----------+--------------+  FV Mid    Full                                                             +---------+---------------+---------+-----------+----------+--------------+  FV Distal Full                                                             +---------+---------------+---------+-----------+----------+--------------+  PFV       Full                                                             +---------+---------------+---------+-----------+----------+--------------+  POP       Full            Yes       Yes                                     +---------+---------------+---------+-----------+----------+--------------+  PTV       Full                                                             +---------+---------------+---------+-----------+----------+--------------+  PERO      Full                                                             +---------+---------------+---------+-----------+----------+--------------+   +---------+---------------+---------+-----------+----------+--------------+  LEFT      Compressibility Phasicity Spontaneity Properties Thrombus Aging  +---------+---------------+---------+-----------+----------+--------------+  CFV       Full            Yes       Yes                                    +---------+---------------+---------+-----------+----------+--------------+  SFJ       Full                                                             +---------+---------------+---------+-----------+----------+--------------+  FV Prox   Full                                                             +---------+---------------+---------+-----------+----------+--------------+  FV Mid    Full                                                             +---------+---------------+---------+-----------+----------+--------------+  FV Distal Full                                                             +---------+---------------+---------+-----------+----------+--------------+  PFV       Full                                                             +---------+---------------+---------+-----------+----------+--------------+  POP       Full            Yes       Yes                                    +---------+---------------+---------+-----------+----------+--------------+  PTV       Full                                                             +---------+---------------+---------+-----------+----------+--------------+  PERO      Full                                                              +---------+---------------+---------+-----------+----------+--------------+     Summary: RIGHT: - There is no evidence of deep vein thrombosis in the lower extremity.  - No cystic structure found in the popliteal fossa.  LEFT: - There is no evidence of deep vein thrombosis in the lower extremity.  - No cystic structure found in the popliteal fossa.  *See table(s) above for measurements and observations.    Preliminary     Medications:    atorvastatin  40 mg Oral QHS   clopidogrel  75 mg Oral Daily   insulin aspart  0-5 Units Subcutaneous QHS   insulin aspart  0-9 Units Subcutaneous TID WC   pantoprazole  40 mg Oral Daily   sodium chloride flush  3 mL Intravenous Once   Continuous Infusions:  sodium chloride 75 mL/hr at 06/05/19 0231     LOS: 0 days   Radene Gunning NP [facility]   How to contact the [facility] Attending or Consulting provider [place] or covering provider during after hours [place], for this patient?  1. Check the care team in [facility] and look for a) attending/consulting [facility] provider listed and b) the [facility] team listed 2. Log into [email or web address] and use [place] universal password to access. If you do not have the password, please contact the hospital operator. 3. Locate the [facility] provider you are looking for under [facility] and page to a number that you can be directly reached. 4. If you still have difficulty reaching the provider, please page the [facility] (Director on Call) for the Hospitalists listed on amion for assistance.  06/05/2019, 1:57 PM

## 2019-06-05 NOTE — Progress Notes (Signed)
STROKE TEAM PROGRESS NOTE   INTERVAL HISTORY Pt sitting in bed, stated that he had word finding difficulty yesterday 9am while talking to his wife. The symptoms on and off fluctuate throughout the day and it happened again when he talked to his brother over the phone in the pm. His wife talked to his PCP and recommended to come to ER for evaluation. MRI showed 3-4 punctate b/l frontal infarcts.  Discussed with pt about loop recorder and Bayer clinical trials, he is in agreement.  Vitals:   06/05/19 0600 06/05/19 0827 06/05/19 1045 06/05/19 1149  BP: (!) 152/91 (!) 149/100 (!) 154/94 (!) 149/100  Pulse: 68 86 80 78  Resp: 20 18 18 17   Temp: 98.6 F (37 C) 98.6 F (37 C) 98.6 F (37 C) 98.6 F (37 C)  TempSrc: Oral Oral Oral Oral  SpO2: 100% 100% 100% 100%    CBC:  Recent Labs  Lab 06/04/19 1704 06/04/19 1725  WBC 6.2  --   NEUTROABS 3.1  --   HGB 12.2* 12.6*  HCT 37.6* 37.0*  MCV 90.8  --   PLT 350  --     Basic Metabolic Panel:  Recent Labs  Lab 06/04/19 1704 06/04/19 1725  NA 138 136  K 4.4 5.5*  CL 101 102  CO2 24  --   GLUCOSE 126* 126*  BUN 18 27*  CREATININE 1.28* 1.30*  CALCIUM 9.7  --    Lipid Panel:     Component Value Date/Time   CHOL 112 06/05/2019 0325   TRIG 92 06/05/2019 0325   HDL 38 (L) 06/05/2019 0325   CHOLHDL 2.9 06/05/2019 0325   VLDL 18 06/05/2019 0325   LDLCALC 56 06/05/2019 0325   HgbA1c:  Lab Results  Component Value Date   HGBA1C 6.0 (H) 06/27/2016   Urine Drug Screen: No results found for: LABOPIA, COCAINSCRNUR, LABBENZ, AMPHETMU, THCU, LABBARB  Alcohol Level No results found for: ETH  IMAGING past 48 hours CT Angio Head W or Wo Contrast  Result Date: 06/05/2019 CLINICAL DATA:  Stroke follow-up EXAM: CT ANGIOGRAPHY HEAD AND NECK TECHNIQUE: Multidetector CT imaging of the head and neck was performed using the standard protocol during bolus administration of intravenous contrast. Multiplanar CT image reconstructions and MIPs  were obtained to evaluate the vascular anatomy. Carotid stenosis measurements (when applicable) are obtained utilizing NASCET criteria, using the distal internal carotid diameter as the denominator. CONTRAST:  70mL OMNIPAQUE IOHEXOL 350 MG/ML SOLN COMPARISON:  Brain MRI 06/04/2019 FINDINGS: CTA NECK FINDINGS SKELETON: C4-7 anterior spinal fusion and C2-6 posterior instrumented fusion. OTHER NECK: Normal pharynx, larynx and major salivary glands. No cervical lymphadenopathy. Unremarkable thyroid gland. UPPER CHEST: No pneumothorax or pleural effusion. No nodules or masses. AORTIC ARCH: There is no calcific atherosclerosis of the aortic arch. There is no aneurysm, dissection or hemodynamically significant stenosis of the visualized portion of the aorta. Conventional 3 vessel aortic branching pattern. The visualized proximal subclavian arteries are widely patent. RIGHT CAROTID SYSTEM: Normal without aneurysm, dissection or stenosis. LEFT CAROTID SYSTEM: No dissection, occlusion or aneurysm. Mild atherosclerotic calcification at the carotid bifurcation without hemodynamically significant stenosis. VERTEBRAL ARTERIES: Left dominant configuration. Both origins are clearly patent. There is no dissection, occlusion or flow-limiting stenosis to the skull base (V1-V3 segments). CTA HEAD FINDINGS POSTERIOR CIRCULATION: --Vertebral arteries: Minimal atherosclerotic calcification in the left V4 segment. --Posterior inferior cerebellar arteries (PICA): Patent origins from the vertebral arteries. --Anterior inferior cerebellar arteries (AICA): Patent origins from the basilar artery. --Basilar artery:  Normal. --Superior cerebellar arteries: Normal. --Posterior cerebral arteries: Normal. Both originate from the basilar artery. Posterior communicating arteries (p-comm) are diminutive or absent. ANTERIOR CIRCULATION: --Intracranial internal carotid arteries: Normal. --Anterior cerebral arteries (ACA): Normal. Both A1 segments are  present. Patent anterior communicating artery (a-comm). --Middle cerebral arteries (MCA): Normal. VENOUS SINUSES: As permitted by contrast timing, patent. ANATOMIC VARIANTS: None Review of the MIP images confirms the above findings. IMPRESSION: 1. No emergent large vessel occlusion or hemodynamically significant stenosis of the head or neck. 2. Mild left carotid bifurcation atherosclerotic calcification without hemodynamically significant stenosis. Electronically Signed   By: Ulyses Jarred M.D.   On: 06/05/2019 01:44   DG Chest 2 View  Result Date: 06/05/2019 CLINICAL DATA:  Stroke EXAM: CHEST - 2 VIEW COMPARISON:  06/29/2016 FINDINGS: Normal heart size and stable mediastinal contours. Chronic interstitial coarsening at the bases with interstitial opacity seen since 2012 chest CT. There is no edema, consolidation, effusion, or pneumothorax. Cervical fusion hardware. Thoracic spondylosis with bridging osteophytes. IMPRESSION: Chronic lung disease without acute superimposed finding. Electronically Signed   By: Monte Fantasia M.D.   On: 06/05/2019 07:02   CT HEAD WO CONTRAST  Result Date: 06/04/2019 CLINICAL DATA:  Slurred speech, memory loss, lack of concentration, symptoms since 9 a.m. today EXAM: CT HEAD WITHOUT CONTRAST TECHNIQUE: Contiguous axial images were obtained from the base of the skull through the vertex without intravenous contrast. COMPARISON:  06/25/2016 FINDINGS: Brain: Dilated perivascular space versus chronic lacunar infarct left thalamus unchanged. No acute infarct or hemorrhage. Lateral ventricles and remaining midline structures are unremarkable. No acute extra-axial fluid collections. No mass effect. Vascular: No hyperdense vessel or unexpected calcification. Skull: Normal. Negative for fracture or focal lesion. Sinuses/Orbits: No acute finding. Other: None IMPRESSION: 1. No acute intracranial process.  The Electronically Signed   By: Randa Ngo M.D.   On: 06/04/2019 18:23   CT  Angio Neck W and/or Wo Contrast  Result Date: 06/05/2019 CLINICAL DATA:  Stroke follow-up EXAM: CT ANGIOGRAPHY HEAD AND NECK TECHNIQUE: Multidetector CT imaging of the head and neck was performed using the standard protocol during bolus administration of intravenous contrast. Multiplanar CT image reconstructions and MIPs were obtained to evaluate the vascular anatomy. Carotid stenosis measurements (when applicable) are obtained utilizing NASCET criteria, using the distal internal carotid diameter as the denominator. CONTRAST:  30mL OMNIPAQUE IOHEXOL 350 MG/ML SOLN COMPARISON:  Brain MRI 06/04/2019 FINDINGS: CTA NECK FINDINGS SKELETON: C4-7 anterior spinal fusion and C2-6 posterior instrumented fusion. OTHER NECK: Normal pharynx, larynx and major salivary glands. No cervical lymphadenopathy. Unremarkable thyroid gland. UPPER CHEST: No pneumothorax or pleural effusion. No nodules or masses. AORTIC ARCH: There is no calcific atherosclerosis of the aortic arch. There is no aneurysm, dissection or hemodynamically significant stenosis of the visualized portion of the aorta. Conventional 3 vessel aortic branching pattern. The visualized proximal subclavian arteries are widely patent. RIGHT CAROTID SYSTEM: Normal without aneurysm, dissection or stenosis. LEFT CAROTID SYSTEM: No dissection, occlusion or aneurysm. Mild atherosclerotic calcification at the carotid bifurcation without hemodynamically significant stenosis. VERTEBRAL ARTERIES: Left dominant configuration. Both origins are clearly patent. There is no dissection, occlusion or flow-limiting stenosis to the skull base (V1-V3 segments). CTA HEAD FINDINGS POSTERIOR CIRCULATION: --Vertebral arteries: Minimal atherosclerotic calcification in the left V4 segment. --Posterior inferior cerebellar arteries (PICA): Patent origins from the vertebral arteries. --Anterior inferior cerebellar arteries (AICA): Patent origins from the basilar artery. --Basilar artery: Normal.  --Superior cerebellar arteries: Normal. --Posterior cerebral arteries: Normal. Both originate from the basilar artery.  Posterior communicating arteries (p-comm) are diminutive or absent. ANTERIOR CIRCULATION: --Intracranial internal carotid arteries: Normal. --Anterior cerebral arteries (ACA): Normal. Both A1 segments are present. Patent anterior communicating artery (a-comm). --Middle cerebral arteries (MCA): Normal. VENOUS SINUSES: As permitted by contrast timing, patent. ANATOMIC VARIANTS: None Review of the MIP images confirms the above findings. IMPRESSION: 1. No emergent large vessel occlusion or hemodynamically significant stenosis of the head or neck. 2. Mild left carotid bifurcation atherosclerotic calcification without hemodynamically significant stenosis. Electronically Signed   By: Ulyses Jarred M.D.   On: 06/05/2019 01:44   MR BRAIN WO CONTRAST  Addendum Date: 06/04/2019   ADDENDUM REPORT: 06/04/2019 23:41 ADDENDUM: I discussed the case with Dr. Rory Percy at 11:30 p.m. on 06/04/2019. There are actually a few punctate foci of hyperintensity on diffusion-weighted imaging within the subcortical white matter of both frontal lobes (series 3 images 36, 39 and 42). There is no clear correlate on the ADC map. The appearance is most compatible with recent, but subacute, punctate infarcts. Evaluation for possible embolic sources may be appropriate. Electronically Signed   By: Ulyses Jarred M.D.   On: 06/04/2019 23:41   Result Date: 06/04/2019 CLINICAL DATA:  Slurred speech and perioral numbness EXAM: MRI HEAD WITHOUT CONTRAST TECHNIQUE: Multiplanar, multiecho pulse sequences of the brain and surrounding structures were obtained without intravenous contrast. COMPARISON:  Brain MRI 06/25/2016 FINDINGS: Brain: No acute infarct, acute hemorrhage or extra-axial collection. Multifocal white matter hyperintensity, most commonly due to chronic ischemic microangiopathy. There is generalized atrophy without lobar  predilection. No chronic microhemorrhage. Normal midline structures. Vascular: Normal flow voids. Skull and upper cervical spine: Normal marrow signal. Sinuses/Orbits: Negative. Other: None. IMPRESSION: 1. No acute intracranial abnormality. 2. Generalized atrophy and findings of chronic ischemic microangiopathy. Electronically Signed: By: Ulyses Jarred M.D. On: 06/04/2019 23:15   ECHOCARDIOGRAM COMPLETE  Result Date: 06/05/2019    ECHOCARDIOGRAM REPORT   Patient Name:   Rodney Crane Date of Exam: 06/05/2019 Medical Rec #:  [id number]     Height:       75.0 in Accession #:    [id number]    Weight:       267.0 lb Date of Birth:  1951-01-12     BSA:          2.48 m Patient Age:    24 years      BP:           149/100 mmHg Patient Gender: M             HR:           86 bpm. Exam Location:  Inpatient Procedure: 2D Echo, Cardiac Doppler and Color Doppler Indications:    Stroke  History:        Patient has prior history of Echocardiogram examinations, most                 recent 06/26/2016. Abnormal ECG, Stroke, Signs/Symptoms:Dyspnea                 and Dizziness/Lightheadedness; Risk Factors:Diabetes,                 Hypertension, Sleep Apnea, Current Smoker and Dyslipidemia.  Sonographer:    Roseanna Rainbow RDCS Referring Phys: [place]  1. Left ventricular ejection fraction, by estimation, is 65 to 70%. The left ventricle has normal function. The left ventrical has no regional wall motion abnormalities. There is moderately increased left ventricular hypertrophy of the basal-septal segment. Left ventricular diastolic parameters  were normal.  2. Right ventricular systolic function is normal. The right ventricular size is normal. Tricuspid regurgitation signal is inadequate for assessing PA pressure.  3. The mitral valve is normal in structure and function. no evidence of mitral valve regurgitation. No evidence of mitral stenosis.  4. The aortic valve is normal in structure and function. Aortic valve  regurgitation is not visualized. No aortic stenosis is present.  5. Aortic dilatation noted. 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  6. The inferior vena cava is normal in size with <50% respiratory variability, suggesting right atrial pressure of 8 mmHg. FINDINGS  Left Ventricle: Left ventricular ejection fraction, by estimation, is 65 to 70%. The left ventricle has normal function. The left ventricle has no regional wall motion abnormalities. The left ventricular internal cavity size was normal in size. There is  moderately increased. There is moderately increased left ventricular hypertrophy of the basal-septal segment. Left ventricular diastolic parameters were normal. Right Ventricle: The right ventricular size is normal. No increase in right ventricular wall thickness. Right ventricular systolic function is normal. Tricuspid regurgitation signal is inadequate for assessing PA pressure. Left Atrium: Left atrial size was normal in size. Right Atrium: Right atrial size was normal in size. Pericardium: There is no evidence of pericardial effusion. Mitral Valve: The mitral valve is normal in structure and function. Normal mobility of the mitral valve leaflets. No evidence of mitral valve regurgitation. No evidence of mitral valve stenosis. Tricuspid Valve: The tricuspid valve is normal in structure. Tricuspid valve regurgitation is not demonstrated. No evidence of tricuspid stenosis. Aortic Valve: The aortic valve is normal in structure and function. Aortic valve regurgitation is not visualized. No aortic stenosis is present. Pulmonic Valve: The pulmonic valve was normal in structure. Pulmonic valve regurgitation is not visualized. No evidence of pulmonic stenosis. Aorta: The aortic root is normal in size and structure and aortic dilatation noted. 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 Venous: The inferior vena cava is normal in size with less than 50% respiratory  variability, suggesting right atrial pressure of 8 mmHg. The inferior vena cava and the hepatic vein show a normal flow pattern. IAS/Shunts: The interatrial septum appears to be lipomatous. No atrial level shunt detected by color flow Doppler.  LEFT VENTRICLE PLAX 2D LVIDd:         3.60 cm       Diastology LVIDs:         2.68 cm       LV e' lateral:   10.30 cm/s LV PW:         1.41 cm       LV E/e' lateral: 6.8 LV IVS:        1.43 cm       LV e' medial:    6.31 cm/s LVOT diam:     2.10 cm       LV E/e' medial:  11.1 LV SV:         55.07 ml LV SV Index:   10.93 LVOT Area:     3.46 cm  LV Volumes (MOD) LV area d, A2C:    22.00 cm LV area d, A4C:    22.50 cm LV area s, A2C:    9.95 cm LV area s, A4C:    11.50 cm LV major d, A2C:   7.59 cm LV major d, A4C:   6.67 cm LV major s, A2C:   5.96 cm LV major s,  A4C:   5.21 cm LV vol d, MOD A2C: 54.1 ml LV vol d, MOD A4C: 62.1 ml LV vol s, MOD A2C: 14.2 ml LV vol s, MOD A4C: 21.7 ml LV SV MOD A2C:     39.9 ml LV SV MOD A4C:     62.1 ml LV SV MOD BP:      42.9 ml RIGHT VENTRICLE            IVC RV S prime:     9.25 cm/s  IVC diam: 1.81 cm TAPSE (M-mode): 1.9 cm LEFT ATRIUM           Index       RIGHT ATRIUM           Index LA diam:      2.80 cm 1.13 cm/m  RA Area:     12.10 cm LA Vol (A2C): 65.9 ml 26.56 ml/m RA Volume:   26.50 ml  10.68 ml/m LA Vol (A4C): 26.7 ml 10.76 ml/m  AORTIC VALVE LVOT Vmax:   93.10 cm/s LVOT Vmean:  56.100 cm/s LVOT VTI:    0.159 m  AORTA Ao Root diam: 3.70 cm Ao Asc diam:  4.10 cm MITRAL VALVE MV Area (PHT): 4.31 cm             SHUNTS MV Decel Time: 176 msec             Systemic VTI:  0.16 m MV E velocity: 69.80 cm/s 103 cm/s  Systemic Diam: 2.10 cm MV A velocity: 80.10 cm/s 70.3 cm/s MV E/A ratio:  0.87       1.5 Fransico Him MD Electronically signed by Fransico Him MD Signature Date/Time: 06/05/2019/10:52:29 AM    Final    VAS [place] LOWER EXTREMITY VENOUS (DVT)  Result Date: 06/05/2019  Lower Venous DVTStudy Indications: Stroke.  Comparison  Study: 08/28/2016- negative lower extremity venous duplex Performing Technologist: Maudry Mayhew MHA, RDMS, RVT, RDCS  Examination Guidelines: A complete evaluation includes B-mode imaging, spectral Doppler, color Doppler, and power Doppler as needed of all accessible portions of each vessel. Bilateral testing is considered an integral part of a complete examination. Limited examinations for reoccurring indications may be performed as noted. The reflux portion of the exam is performed with the patient in reverse Trendelenburg.  +---------+---------------+---------+-----------+----------+--------------+ RIGHT    CompressibilityPhasicitySpontaneityPropertiesThrombus Aging +---------+---------------+---------+-----------+----------+--------------+ CFV      Full           Yes      Yes                                 +---------+---------------+---------+-----------+----------+--------------+ SFJ      Full                                                        +---------+---------------+---------+-----------+----------+--------------+ FV Prox  Full                                                        +---------+---------------+---------+-----------+----------+--------------+ FV Mid   Full                                                        +---------+---------------+---------+-----------+----------+--------------+  FV DistalFull                                                        +---------+---------------+---------+-----------+----------+--------------+ PFV      Full                                                        +---------+---------------+---------+-----------+----------+--------------+ POP      Full           Yes      Yes                                 +---------+---------------+---------+-----------+----------+--------------+ PTV      Full                                                         +---------+---------------+---------+-----------+----------+--------------+ PERO     Full                                                        +---------+---------------+---------+-----------+----------+--------------+   +---------+---------------+---------+-----------+----------+--------------+ LEFT     CompressibilityPhasicitySpontaneityPropertiesThrombus Aging +---------+---------------+---------+-----------+----------+--------------+ CFV      Full           Yes      Yes                                 +---------+---------------+---------+-----------+----------+--------------+ SFJ      Full                                                        +---------+---------------+---------+-----------+----------+--------------+ FV Prox  Full                                                        +---------+---------------+---------+-----------+----------+--------------+ FV Mid   Full                                                        +---------+---------------+---------+-----------+----------+--------------+ FV DistalFull                                                        +---------+---------------+---------+-----------+----------+--------------+  PFV      Full                                                        +---------+---------------+---------+-----------+----------+--------------+ POP      Full           Yes      Yes                                 +---------+---------------+---------+-----------+----------+--------------+ PTV      Full                                                        +---------+---------------+---------+-----------+----------+--------------+ PERO     Full                                                        +---------+---------------+---------+-----------+----------+--------------+     Summary: RIGHT: - There is no evidence of deep vein thrombosis in the lower extremity.  - No cystic structure found in  the popliteal fossa.  LEFT: - There is no evidence of deep vein thrombosis in the lower extremity.  - No cystic structure found in the popliteal fossa.  *See table(s) above for measurements and observations.    Preliminary     PHYSICAL EXAM  Temp:  [97.7 F (36.5 C)-98.8 F (37.1 C)] 98.4 F (36.9 C) (02/10 1600) Pulse Rate:  [68-93] 77 (02/10 1600) Resp:  [15-26] 16 (02/10 1600) BP: (125-173)/(76-129) 125/83 (02/10 1600) SpO2:  [98 %-100 %] 100 % (02/10 1600)  General - Well nourished, well developed, in no apparent distress.  Ophthalmologic - fundi not visualized due to noncooperation.  Cardiovascular - Regular rhythm and rate.  Mental Status -  Level of arousal and orientation to time, place, and person were intact. Language including expression, naming, repetition, comprehension was assessed and found intact. Fund of Knowledge was assessed and was intact.  Cranial Nerves II - XII - II - Visual field intact OU. III, IV, VI - Extraocular movements intact. V - Facial sensation intact bilaterally. VII - Facial movement intact bilaterally. VIII - Hearing & vestibular intact bilaterally. X - Palate elevates symmetrically. XI - Chin turning & shoulder shrug intact bilaterally. XII - Tongue protrusion intact.  Motor Strength - The patient's strength was normal in all extremities and pronator drift was absent.  Bulk was normal and fasciculations were absent.   Motor Tone - Muscle tone was assessed at the neck and appendages and was normal.  Reflexes - The patient's reflexes were symmetrical in all extremities and he had no pathological reflexes.  Sensory - Light touch, temperature/pinprick were assessed and were symmetrical.    Coordination - The patient had normal movements in the hands and feet with no ataxia or dysmetria.  Tremor was absent.  Gait and Station - deferred.   ASSESSMENT/PLAN Mr. Rodney Crane is a 69 y.o. 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     MRI  B frontal lobe subcortical white matter subacute infarcts.    CTA head & neck no ELVO. Mild L ICA bifurcation   LE Doppler  No DVT  2D Echo EF 65-70%. No source of embolus   EP cardiology consulted for loop recorder placement tomorrow to look for AF as source of stroke  LDL 56  HgbA1c pending   SCDs for VTE prophylaxis  clopidogrel 75 mg daily prior to admission, now on clopidogrel 75 mg daily and Bayer trial study meds. Continue at discharge  Therapy recommendations:  None  Disposition:  Return home  Follow up Dr. Star Age at [facility]  Hypertension  Stable . BP goal normotensive  Hyperlipidemia  Home meds:  lipitor 40, resumed in hospital  LDL 56, goal < 70  Continue statin at discharge  Diabetes type II  HgbA1c pending, goal < 7.0  CBGs  SSI  PCP follow up  Tobacco abuse  Pt admitted cigar smoking from time to time  Smoking cessation counseling provided  Pt is willing to quit  Other Stroke Risk Factors  Advanced age  Hx stroke/TIA  11/2014 - asymptomatic L thalamic infarct per MRI done at [facility] in [place])  Obstructive sleep apnea, on CPAP at home  Hx DVT in 06/2016 after cervical spine surgery  Other Active Problems  CKD stage II  BPH on flomax  H/o Bell's Palsy  ED  Hospital day # 0  Rosalin Hawking, MD PhD Stroke Neurology 06/05/2019 5:40 PM    To contact Stroke Continuity provider, please refer to [email or web address] After hours, contact General Neurology

## 2019-06-05 NOTE — Progress Notes (Signed)
Bilateral lower extremity venous duplex completed. Refer to "CV Proc" under chart review to view preliminary results.  06/05/2019 11:21 AM Kelby Aline., MHA, RVT, RDCS, RDMS

## 2019-06-05 NOTE — Evaluation (Signed)
Speech Language Pathology Evaluation Patient Details Name: Rodney Crane MRN: [id number] DOB: [birth year] Today's Date: 06/05/2019 Time: [id number] SLP Time Calculation (min) (ACUTE ONLY): 38 min  Problem List:  Patient Active Problem List   Diagnosis Date Noted  . CVA (cerebral vascular accident) ([place]) 06/05/2019  . Pain due to onychomycosis of toenails of both feet 10/25/2018  . Acute deep vein thrombosis (DVT) of distal vein of both lower extremities ([place]) 07/07/2016  . Abdominal distention   . Abdominal pain   . S/P cervical spinal fusion   . Type 2 diabetes mellitus with peripheral neuropathy (HCC)   . Hyperlipidemia   . Neurogenic bowel   . Myelopathy ([place])   . OSA on CPAP   . Surgery, elective   . History of CVA (cerebrovascular accident)   . Benign essential HTN   . History of cervical spinal surgery   . History of lumbar surgery   . Tobacco abuse   . Diastolic dysfunction   . Acute blood loss anemia   . Post-operative pain   . Tachycardia   . Leukocytosis   . Spinal stenosis of lumbar region   . Central cord syndrome ([place])   . Acute hypoxemic respiratory failure ([place])   . Spinal stenosis in cervical region 06/27/2016  . Cervical stenosis of spinal canal 06/25/2016  . Weakness of extremity 06/25/2016  . OSA (obstructive sleep apnea)   . Hypertension   . Diabetes ([place])   . DYSPNEA ON EXERTION 10/06/2009  . Nonspecific (abnormal) findings on radiological and other examination of body structure 10/06/2009  . CT, CHEST, ABNORMAL 10/06/2009  . COLONIC POLYPS 10/05/2009  . DM (diabetes mellitus), type 2 with renal complications ([place]) [id number]  . Type 2 diabetes mellitus with hyperlipidemia ([place]) 10/05/2009  . 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 Essential hypertension 10/05/2009  . CVA (cerebrovascular accident) ([place]) 10/05/2009  . 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 Abdominal aortic ectasia ([place])    . Arthritis   . Cervical spondylosis   . Colon polyps   . CVA (cerebral infarction) 2010  . Diabetes mellitus   . Dizziness   . DVT (deep venous thrombosis) (HCC)    bilateral  . ED (erectile dysfunction)   . Foot drop   . H/O Bell's palsy   . Hyperlipidemia   . Hypertension   . Obesity   . OSA on CPAP   . Raynauds syndrome   . Restrictive lung disease   . Spinal stenosis   . Stroke ([place])   . Unsteady gait    Past Surgical History:  Past Surgical History:  Procedure Laterality Date  . CARPAL TUNNEL RELEASE Bilateral   . CERVICAL DISCECTOMY  2004  . LUMBAR DISC SURGERY     x6  . POSTERIOR CERVICAL FUSION/FORAMINOTOMY N/A 06/27/2016   Procedure: POSTERIOR CERVICAL FUSION C2-C5; C2-C4 Decompression;  Surgeon: Melina Schools, MD;  Location: [facility];  Service: Orthopedics;  Laterality: N/A;   HPI:  Rodney Crane is a 69 y.o. male with medical history significant of HLD, OSA on CPAP, HTN, smoking, hyperlipidemia, type 2 diabetes, degenerative back disease, s/p 7 back surgeries, and stroke (left thalamic on MRI), history of bilateral DVT history of foot drop and Bell's palsy. Pt had prior ST eval, but no current needs now.   Assessment / Plan / Recommendation Clinical Impression   Pt assessed for cognitive-linguistic functioning via COGNISTAT. Pt scored within the average range for subtests assessed, but  he reported feeling a little "foggy." Pt stated he currently lives at home with his wife, and she is able to assist with daily needs (medications, bills, etc), as needed. Further SLP services are not needed at this time, however, pt was made aware of outpatient SLP services if needed as he returns home to daily activities. Pt agreed with current recommendations.     SLP Assessment  SLP Recommendation/Assessment: Patient does not need any further Speech Lanaguage Pathology Services SLP Visit Diagnosis: Cognitive communication deficit (R41.841)    Follow Up Recommendations  None     Frequency and Duration           SLP Evaluation Cognition  Overall Cognitive Status: Within Functional Limits for tasks assessed Orientation Level: Oriented X4       Comprehension  Auditory Comprehension Overall Auditory Comprehension: Appears within functional limits for tasks assessed Visual Recognition/Discrimination Discrimination: Within Function Limits Reading Comprehension Reading Status: Not tested    Expression Expression Primary Mode of Expression: Verbal Verbal Expression Overall Verbal Expression: Appears within functional limits for tasks assessed   Oral / Motor  Oral Motor/Sensory Function Overall Oral Motor/Sensory Function: Within functional limits Motor Speech Overall Motor Speech: Appears within functional limits for tasks assessed   GO                   Aline August, Student SLP Office: [phone]  06/05/2019, 10:52 AM

## 2019-06-05 NOTE — Progress Notes (Signed)
I am in agreement with the assessment and documentation of (med administration)  Denyce Robert, SN [facility].

## 2019-06-05 NOTE — Progress Notes (Signed)
Occupational Therapy Evaluation Patient Details Name: Rodney Crane MRN: [id number] DOB: [birth year] Today's Date: 06/05/2019    History of Present Illness Rodney Crane is a 69 y.o. male past medical history of stroke with no residual deficits, TIAs in the past, hypertension, hyperlipidemia, obesity, sleep apnea, baseline right foot drop likely secondary to multiple back surgeries, tobacco abuse, came to the emergency room for evaluation of multiple episodes concerning for strokes. MRI results which was concerning for punctate scattered acute/subacute strokes in both the cerebral hemispheres.   Clinical Impression   PTA, pt was living at home with his wife, pt reports he was independent with ADL/IADL and modified independent with functional mobility at spc level. Pt currently reports he feels he is functioning at baseline. He required supervision during in room mobility and grooming completion at sink level.  Supervision provided for safety and line management, no physical assistance provided. He demonstrates minor instability with ambulation due to his baseline right foot drop. Pt appears to be functioning at baseline. Patient evaluated by Occupational Therapy with no further acute OT needs identified. All education has been completed and the patient has no further questions. See below for any follow-up Occupational Therapy or equipment needs. OT to sign off. Thank you for referral.      Follow Up Recommendations  No OT follow up    Equipment Recommendations  None recommended by OT    Recommendations for Other Services       Precautions / Restrictions Precautions Precautions: Fall Restrictions Weight Bearing Restrictions: No      Mobility Bed Mobility Overal bed mobility: Independent                Transfers Overall transfer level: Needs assistance Equipment used: Straight cane Transfers: Sit to/from Stand Sit to Stand: Supervision         General transfer comment:  supervision for safety and line management    Balance Overall balance assessment: Needs assistance Sitting-balance support: No upper extremity supported;Feet supported Sitting balance-Leahy Scale: Good     Standing balance support: Single extremity supported;During functional activity Standing balance-Leahy Scale: Fair Standing balance comment: single UE support for prolonged and more dynamic standing balance, intermittent no upper extremity support with static standing balance                           ADL either performed or assessed with clinical judgement   ADL Overall ADL's : Needs assistance/impaired                                     Functional mobility during ADLs: Supervision/safety;Cane General ADL Comments: overall supervision for safety with ADL completion and functional mobiltiy;pt reports he is at baseline     Vision Baseline Vision/History: Wears glasses Wears Glasses: At all times Patient Visual Report: No change from baseline Vision Assessment?: No apparent visual deficits     Perception     Praxis      Pertinent Vitals/Pain Pain Assessment: No/denies pain     Hand Dominance Right   Extremity/Trunk Assessment Upper Extremity Assessment Upper Extremity Assessment: Overall WFL for tasks assessed   Lower Extremity Assessment Lower Extremity Assessment: Defer to PT evaluation;RLE deficits/detail RLE Deficits / Details: right foot drop   Cervical / Trunk Assessment Cervical / Trunk Assessment: Other exceptions Cervical / Trunk Exceptions: previous cervical fusion   Communication Communication Communication: No difficulties  Cognition Arousal/Alertness: Awake/alert Behavior During Therapy: WFL for tasks assessed/performed Overall Cognitive Status: Within Functional Limits for tasks assessed                                     General Comments  vss    Exercises     Shoulder Instructions      Home  Living Family/patient expects to be discharged to:: Private residence Living Arrangements: Spouse/significant other Available Help at Discharge: Family;Other (Comment)(wife) Type of Home: House Home Access: Other (comment)(small step)     Home Layout: One level     Bathroom Shower/Tub: Teacher, early years/pre: Standard Bathroom Accessibility: Yes How Accessible: Accessible via walker Home Equipment: [place] - 2 wheels;Cane - single point;Grab bars - tub/shower;Tub bench;Wheelchair - manual      Lives With: Spouse    Prior Functioning/Environment Level of Independence: Independent                 OT Problem List: Impaired balance (sitting and/or standing);Decreased knowledge of precautions      OT Treatment/Interventions:      OT Goals(Current goals can be found in the care plan section) Acute Rehab OT Goals Patient Stated Goal: to go home OT Goal Formulation: With patient Time For Goal Achievement: 06/19/19 Potential to Achieve Goals: Good  OT Frequency:     Barriers to D/C:            Co-evaluation              AM-PAC OT "6 Clicks" Daily Activity     Outcome Measure Help from another person eating meals?: None Help from another person taking care of personal grooming?: None Help from another person toileting, which includes using toliet, bedpan, or urinal?: None Help from another person bathing (including washing, rinsing, drying)?: None Help from another person to put on and taking off regular upper body clothing?: None Help from another person to put on and taking off regular lower body clothing?: None 6 Click Score: 24   End of Session Equipment Utilized During Treatment: Gait belt(cane) Nurse Communication: Mobility status  Activity Tolerance: Patient tolerated treatment well Patient left: in chair;with call bell/phone within reach(pt verbalized understanding to not get up alone)  OT Visit Diagnosis: Other abnormalities of gait and  mobility (R26.89)                [id number] OT Time Calculation (min): 21 min Charges:  OT General Charges $OT Visit: 1 Visit OT Evaluation $OT Eval Low Complexity: [place] OTR/L Acute Rehabilitation Services Office: [phone]   Wyn Forster 06/05/2019, 10:58 AM

## 2019-06-05 NOTE — Research (Signed)
Pt had b/l frontal infarcts on MRI, time onset 9am yesterday. Stroke work up so far no clear etiology, non-lacunar infarct, no clear cardiac source. Will request loop recorder to rule out afib. At the meantime, pt is candidate for [employer] stroke trail. Discussed with pt and he is interested. Will enroll and randomize to the trial. He will continue plavix for stroke prevention. He will follow up with [facility] for trail follow up.   Pt had right distal LE DVT in 06/2016 after cervical spine surgery. As per pt, he was put on plavix until now. No coumadin, eliquis or xarelto. Repeat LE DVT in one week, no new DVT. Repeat LE DVT in 08/2016 showed DVT resolution. This admission, LE venous doppler negative for DVT.   Pt home meds includes omeprazole, however pt stated that he did not take omeprazole, instead he takes protonix at home. Will continue protonix on discharge. He can not take omeprazole due to on this clinical trial.   Rosalin Hawking, MD PhD Stroke Neurology 06/05/2019 5:50 PM

## 2019-06-05 NOTE — Evaluation (Signed)
Physical Therapy Evaluation Patient Details Name: Rodney Crane MRN: [id number] DOB: 1950-09-07 Today's Date: 06/05/2019   History of Present Illness  Rodney Crane is a 69 y.o. male past medical history of stroke with no residual deficits, TIAs in the past, hypertension, hyperlipidemia, obesity, sleep apnea, baseline right foot drop likely secondary to multiple back surgeries, tobacco abuse, came to the emergency room for evaluation of multiple episodes concerning for strokes. MRI results which was concerning for punctate scattered acute/subacute strokes in both the cerebral hemispheres.  Clinical Impression  Prior to admission, pt lives with his wife and is modified independent with mobility using a cane. Currently, pt denying residual deficits from baseline. Ambulating hallway distances with a cane and right AFO at a supervision level. Displays baseline right lower extremity weakness, balance impairments and gait abnormalities. Will follow acutely to progress mobility.    Follow Up Recommendations No PT follow up (politely declined OPPT currently)    Equipment Recommendations  None recommended by PT    Recommendations for Other Services       Precautions / Restrictions Precautions Precautions: Fall;Other (comment) Precaution Comments: R AFO Restrictions Weight Bearing Restrictions: No      Mobility  Bed Mobility Overal bed mobility: Independent                Transfers Overall transfer level: Needs assistance Equipment used: Straight cane Transfers: Sit to/from Stand Sit to Stand: Supervision         General transfer comment: supervision for safety and line management  Ambulation/Gait Ambulation/Gait assistance: Supervision Gait Distance (Feet): 250 Feet Assistive device: Straight cane Gait Pattern/deviations: Step-through pattern;Decreased dorsiflexion - right;Trunk flexed Gait velocity: decreased   General Gait Details: Pt requiring supervision for safety,  increased right foot external rotation, no gross unsteadiness. Prefers to cane in right hand  Stairs            Wheelchair Mobility    Modified Rankin (Stroke Patients Only) Modified Rankin (Stroke Patients Only) Pre-Morbid Rankin Score: No significant disability Modified Rankin: No significant disability     Balance Overall balance assessment: Needs assistance Sitting-balance support: No upper extremity supported;Feet supported Sitting balance-Leahy Scale: Good     Standing balance support: Single extremity supported;During functional activity Standing balance-Leahy Scale: Fair Standing balance comment: single UE support for prolonged and more dynamic standing balance, intermittent no upper extremity support with static standing balance                             Pertinent Vitals/Pain Pain Assessment: No/denies pain    Home Living Family/patient expects to be discharged to:: Private residence Living Arrangements: Spouse/significant other Available Help at Discharge: Family;Other (Comment)(wife) Type of Home: House Home Access: Other (comment)(small step)     Home Layout: One level Home Equipment: Walker - 2 wheels;Cane - single point;Grab bars - tub/shower;Tub bench;Wheelchair - manual      Prior Function Level of Independence: Independent               Hand Dominance   Dominant Hand: Right    Extremity/Trunk Assessment   Upper Extremity Assessment Upper Extremity Assessment: Overall WFL for tasks assessed    Lower Extremity Assessment Lower Extremity Assessment: RLE deficits/detail;LLE deficits/detail RLE Deficits / Details: Strength 5/5 except right ankle dorsiflexion/plantarflexion 1/5 (baseline) LLE Deficits / Details: Strength 5/5    Cervical / Trunk Assessment Cervical / Trunk Assessment: Other exceptions Cervical / Trunk Exceptions: previous cervical fusion  Communication  Communication: No difficulties  Cognition  Arousal/Alertness: Awake/alert Behavior During Therapy: WFL for tasks assessed/performed Overall Cognitive Status: Within Functional Limits for tasks assessed                                        General Comments      Exercises     Assessment/Plan    PT Assessment Patient needs continued PT services  PT Problem List Decreased strength;Decreased balance;Decreased mobility       PT Treatment Interventions DME instruction;Gait training;Stair training;Functional mobility training;Therapeutic activities;Therapeutic exercise;Balance training;Patient/family education    PT Goals (Current goals can be found in the Care Plan section)  Acute Rehab PT Goals Patient Stated Goal: to go home PT Goal Formulation: With patient Time For Goal Achievement: 06/19/19 Potential to Achieve Goals: Good    Frequency Min 3X/week   Barriers to discharge        Co-evaluation               AM-PAC PT "6 Clicks" Mobility  Outcome Measure Help needed turning from your back to your side while in a flat bed without using bedrails?: None Help needed moving from lying on your back to sitting on the side of a flat bed without using bedrails?: None Help needed moving to and from a bed to a chair (including a wheelchair)?: None Help needed standing up from a chair using your arms (e.g., wheelchair or bedside chair)?: None Help needed to walk in hospital room?: None Help needed climbing 3-5 steps with a railing? : A Little 6 Click Score: 23    End of Session Equipment Utilized During Treatment: Other (comment)(R AFO) Activity Tolerance: Patient tolerated treatment well Patient left: in bed;with call bell/phone within reach Nurse Communication: Mobility status PT Visit Diagnosis: Unsteadiness on feet (R26.81);Other abnormalities of gait and mobility (R26.89);Difficulty in walking, not elsewhere classified (R26.2)    Time: [id number] PT Time Calculation (min) (ACUTE ONLY): 28  min   Charges:   PT Evaluation $PT Eval Moderate Complexity: 1 Mod PT Treatments $Therapeutic Activity: 8-22 mins          Wyona Almas, PT, DPT Acute Rehabilitation Services Pager [phone] Office [phone]   [name] 06/05/2019, 3:16 PM

## 2019-06-06 ENCOUNTER — Encounter (HOSPITAL_COMMUNITY)
Admission: EM | Disposition: A | Discharge status: Home / Self Care | Payer: Self-pay | Source: Home / Self Care | Attending: Internal Medicine

## 2019-06-06 ENCOUNTER — Inpatient Hospital Stay (HOSPITAL_COMMUNITY): Payer: Medicare Other

## 2019-06-06 DIAGNOSIS — E1122 Type 2 diabetes mellitus with diabetic chronic kidney disease: Secondary | ICD-10-CM

## 2019-06-06 DIAGNOSIS — N182 Chronic kidney disease, stage 2 (mild): Secondary | ICD-10-CM

## 2019-06-06 DIAGNOSIS — I6389 Other cerebral infarction: Secondary | ICD-10-CM

## 2019-06-06 DIAGNOSIS — I10 Essential (primary) hypertension: Secondary | ICD-10-CM

## 2019-06-06 HISTORY — PX: LOOP RECORDER INSERTION: EP1214

## 2019-06-06 LAB — GLUCOSE, CAPILLARY
Glucose-Capillary: 135 mg/dL — ABNORMAL HIGH (ref 70–99)
Glucose-Capillary: 166 mg/dL — ABNORMAL HIGH (ref 70–99)

## 2019-06-06 LAB — HEMOGLOBIN A1C
Hgb A1c MFr Bld: 7.4 % — ABNORMAL HIGH (ref 4.8–5.6)
Mean Plasma Glucose: 166 mg/dL

## 2019-06-06 SURGERY — LOOP RECORDER INSERTION

## 2019-06-06 MED ORDER — CYCLOBENZAPRINE HCL 10 MG PO TABS: 10.0000 mg | ORAL_TABLET | Freq: Three times a day (TID) | ORAL | 0 refills | Status: AC | PRN

## 2019-06-06 MED ORDER — STUDY - INVESTIGATIONAL MEDICATION: 99 refills | Status: DC

## 2019-06-06 MED ORDER — STUDY - PACIFIC (STROKE) - BAY 2433334 (BLUE BOTTLE) 5, 15, 25MG OR PLACEBO TABLET (PI-SETHI): 1.0000 | ORAL_TABLET | Freq: Every day | ORAL | 0 refills | Status: DC

## 2019-06-06 MED ORDER — LIDOCAINE-EPINEPHRINE 1 %-1:100000 IJ SOLN
INTRAMUSCULAR | Status: AC
  Filled 2019-06-06: qty 1

## 2019-06-06 MED ORDER — PANTOPRAZOLE SODIUM 40 MG PO TBEC: 40.0000 mg | DELAYED_RELEASE_TABLET | Freq: Every day | ORAL | 1 refills | Status: AC

## 2019-06-06 MED ORDER — SENNOSIDES-DOCUSATE SODIUM 8.6-50 MG PO TABS: 1.0000 | ORAL_TABLET | Freq: Every evening | ORAL | Status: DC | PRN

## 2019-06-06 SURGICAL SUPPLY — 2 items
MONITOR REVEAL LINQ II (Prosthesis & Implant Heart) ×2 IMPLANT
PACK LOOP INSERTION (CUSTOM PROCEDURE TRAY) ×3 IMPLANT

## 2019-06-06 NOTE — Progress Notes (Signed)
PT Cancellation Note  Patient Details Name: Rodney Crane MRN: [id number] DOB: 01-Apr-1951   Cancelled Treatment:    Reason Eval/Treat Not Completed: Other (comment) Pt declining further need for acute PT; stating he has been performing room ambulation modI with cane and is hopeful for home today. No follow up PT recommendations and PT will sign off at this time. Please refer to prior note for PT evaluation. Thank you for the consult.     Wyona Almas, PT, DPT Acute Rehabilitation Services Pager [phone] Office [phone]    [name] 06/06/2019, 12:13 PM

## 2019-06-06 NOTE — Progress Notes (Signed)
STROKE TEAM PROGRESS NOTE   INTERVAL HISTORY Pt sitting in bed, no complains. Had loop recorder placed and also had MRI per research protocol done.    Vitals:   06/05/19 2321 06/06/19 0414 06/06/19 0800 06/06/19 1223  BP: 128/79 121/71 98/78 131/79  Pulse: 71 74 80 77  Resp: 18 18 17 18   Temp: 98.1 F (36.7 C) 98.1 F (36.7 C) 98 F (36.7 C) 98.3 F (36.8 C)  TempSrc: Oral Oral Oral Oral  SpO2: 100% 100% 100% 98%    CBC:  Recent Labs  Lab 06/04/19 1704 06/04/19 1725  WBC 6.2  --   NEUTROABS 3.1  --   HGB 12.2* 12.6*  HCT 37.6* 37.0*  MCV 90.8  --   PLT 350  --     Basic Metabolic Panel:  Recent Labs  Lab 06/04/19 1704 06/04/19 1725  NA 138 136  K 4.4 5.5*  CL 101 102  CO2 24  --   GLUCOSE 126* 126*  BUN 18 27*  CREATININE 1.28* 1.30*  CALCIUM 9.7  --    Lipid Panel:     Component Value Date/Time   CHOL 112 06/05/2019 0325   TRIG 92 06/05/2019 0325   HDL 38 (L) 06/05/2019 0325   CHOLHDL 2.9 06/05/2019 0325   VLDL 18 06/05/2019 0325   LDLCALC 56 06/05/2019 0325   HgbA1c:  Lab Results  Component Value Date   HGBA1C 7.4 (H) 06/05/2019   IMAGING past 48 hours CT Angio Head W or Wo Contrast  Result Date: 06/05/2019 CLINICAL DATA:  Stroke follow-up EXAM: CT ANGIOGRAPHY HEAD AND NECK TECHNIQUE: Multidetector CT imaging of the head and neck was performed using the standard protocol during bolus administration of intravenous contrast. Multiplanar CT image reconstructions and MIPs were obtained to evaluate the vascular anatomy. Carotid stenosis measurements (when applicable) are obtained utilizing NASCET criteria, using the distal internal carotid diameter as the denominator. CONTRAST:  66mL OMNIPAQUE IOHEXOL 350 MG/ML SOLN COMPARISON:  Brain MRI 06/04/2019 FINDINGS: CTA NECK FINDINGS SKELETON: C4-7 anterior spinal fusion and C2-6 posterior instrumented fusion. OTHER NECK: Normal pharynx, larynx and major salivary glands. No cervical lymphadenopathy. Unremarkable  thyroid gland. UPPER CHEST: No pneumothorax or pleural effusion. No nodules or masses. AORTIC ARCH: There is no calcific atherosclerosis of the aortic arch. There is no aneurysm, dissection or hemodynamically significant stenosis of the visualized portion of the aorta. Conventional 3 vessel aortic branching pattern. The visualized proximal subclavian arteries are widely patent. RIGHT CAROTID SYSTEM: Normal without aneurysm, dissection or stenosis. LEFT CAROTID SYSTEM: No dissection, occlusion or aneurysm. Mild atherosclerotic calcification at the carotid bifurcation without hemodynamically significant stenosis. VERTEBRAL ARTERIES: Left dominant configuration. Both origins are clearly patent. There is no dissection, occlusion or flow-limiting stenosis to the skull base (V1-V3 segments). CTA HEAD FINDINGS POSTERIOR CIRCULATION: --Vertebral arteries: Minimal atherosclerotic calcification in the left V4 segment. --Posterior inferior cerebellar arteries (PICA): Patent origins from the vertebral arteries. --Anterior inferior cerebellar arteries (AICA): Patent origins from the basilar artery. --Basilar artery: Normal. --Superior cerebellar arteries: Normal. --Posterior cerebral arteries: Normal. Both originate from the basilar artery. Posterior communicating arteries (p-comm) are diminutive or absent. ANTERIOR CIRCULATION: --Intracranial internal carotid arteries: Normal. --Anterior cerebral arteries (ACA): Normal. Both A1 segments are present. Patent anterior communicating artery (a-comm). --Middle cerebral arteries (MCA): Normal. VENOUS SINUSES: As permitted by contrast timing, patent. ANATOMIC VARIANTS: None Review of the MIP images confirms the above findings. IMPRESSION: 1. No emergent large vessel occlusion or hemodynamically significant stenosis of the head or  neck. 2. Mild left carotid bifurcation atherosclerotic calcification without hemodynamically significant stenosis. Electronically Signed   By: Ulyses Jarred  M.D.   On: 06/05/2019 01:44   DG Chest 2 View  Result Date: 06/05/2019 CLINICAL DATA:  Stroke EXAM: CHEST - 2 VIEW COMPARISON:  06/29/2016 FINDINGS: Normal heart size and stable mediastinal contours. Chronic interstitial coarsening at the bases with interstitial opacity seen since 2012 chest CT. There is no edema, consolidation, effusion, or pneumothorax. Cervical fusion hardware. Thoracic spondylosis with bridging osteophytes. IMPRESSION: Chronic lung disease without acute superimposed finding. Electronically Signed   By: Monte Fantasia M.D.   On: 06/05/2019 07:02   CT HEAD WO CONTRAST  Result Date: 06/04/2019 CLINICAL DATA:  Slurred speech, memory loss, lack of concentration, symptoms since 9 a.m. today EXAM: CT HEAD WITHOUT CONTRAST TECHNIQUE: Contiguous axial images were obtained from the base of the skull through the vertex without intravenous contrast. COMPARISON:  06/25/2016 FINDINGS: Brain: Dilated perivascular space versus chronic lacunar infarct left thalamus unchanged. No acute infarct or hemorrhage. Lateral ventricles and remaining midline structures are unremarkable. No acute extra-axial fluid collections. No mass effect. Vascular: No hyperdense vessel or unexpected calcification. Skull: Normal. Negative for fracture or focal lesion. Sinuses/Orbits: No acute finding. Other: None IMPRESSION: 1. No acute intracranial process.  The Electronically Signed   By: Randa Ngo M.D.   On: 06/04/2019 18:23   CT Angio Neck W and/or Wo Contrast  Result Date: 06/05/2019 CLINICAL DATA:  Stroke follow-up EXAM: CT ANGIOGRAPHY HEAD AND NECK TECHNIQUE: Multidetector CT imaging of the head and neck was performed using the standard protocol during bolus administration of intravenous contrast. Multiplanar CT image reconstructions and MIPs were obtained to evaluate the vascular anatomy. Carotid stenosis measurements (when applicable) are obtained utilizing NASCET criteria, using the distal internal carotid  diameter as the denominator. CONTRAST:  40mL OMNIPAQUE IOHEXOL 350 MG/ML SOLN COMPARISON:  Brain MRI 06/04/2019 FINDINGS: CTA NECK FINDINGS SKELETON: C4-7 anterior spinal fusion and C2-6 posterior instrumented fusion. OTHER NECK: Normal pharynx, larynx and major salivary glands. No cervical lymphadenopathy. Unremarkable thyroid gland. UPPER CHEST: No pneumothorax or pleural effusion. No nodules or masses. AORTIC ARCH: There is no calcific atherosclerosis of the aortic arch. There is no aneurysm, dissection or hemodynamically significant stenosis of the visualized portion of the aorta. Conventional 3 vessel aortic branching pattern. The visualized proximal subclavian arteries are widely patent. RIGHT CAROTID SYSTEM: Normal without aneurysm, dissection or stenosis. LEFT CAROTID SYSTEM: No dissection, occlusion or aneurysm. Mild atherosclerotic calcification at the carotid bifurcation without hemodynamically significant stenosis. VERTEBRAL ARTERIES: Left dominant configuration. Both origins are clearly patent. There is no dissection, occlusion or flow-limiting stenosis to the skull base (V1-V3 segments). CTA HEAD FINDINGS POSTERIOR CIRCULATION: --Vertebral arteries: Minimal atherosclerotic calcification in the left V4 segment. --Posterior inferior cerebellar arteries (PICA): Patent origins from the vertebral arteries. --Anterior inferior cerebellar arteries (AICA): Patent origins from the basilar artery. --Basilar artery: Normal. --Superior cerebellar arteries: Normal. --Posterior cerebral arteries: Normal. Both originate from the basilar artery. Posterior communicating arteries (p-comm) are diminutive or absent. ANTERIOR CIRCULATION: --Intracranial internal carotid arteries: Normal. --Anterior cerebral arteries (ACA): Normal. Both A1 segments are present. Patent anterior communicating artery (a-comm). --Middle cerebral arteries (MCA): Normal. VENOUS SINUSES: As permitted by contrast timing, patent. ANATOMIC VARIANTS:  None Review of the MIP images confirms the above findings. IMPRESSION: 1. No emergent large vessel occlusion or hemodynamically significant stenosis of the head or neck. 2. Mild left carotid bifurcation atherosclerotic calcification without hemodynamically significant stenosis. Electronically Signed  By: Ulyses Jarred M.D.   On: 06/05/2019 01:44   MR BRAIN WO CONTRAST  Addendum Date: 06/04/2019   ADDENDUM REPORT: 06/04/2019 23:41 ADDENDUM: I discussed the case with Dr. Rory Percy at 11:30 p.m. on 06/04/2019. There are actually a few punctate foci of hyperintensity on diffusion-weighted imaging within the subcortical white matter of both frontal lobes (series 3 images 36, 39 and 42). There is no clear correlate on the ADC map. The appearance is most compatible with recent, but subacute, punctate infarcts. Evaluation for possible embolic sources may be appropriate. Electronically Signed   By: Ulyses Jarred M.D.   On: 06/04/2019 23:41   Result Date: 06/04/2019 CLINICAL DATA:  Slurred speech and perioral numbness EXAM: MRI HEAD WITHOUT CONTRAST TECHNIQUE: Multiplanar, multiecho pulse sequences of the brain and surrounding structures were obtained without intravenous contrast. COMPARISON:  Brain MRI 06/25/2016 FINDINGS: Brain: No acute infarct, acute hemorrhage or extra-axial collection. Multifocal white matter hyperintensity, most commonly due to chronic ischemic microangiopathy. There is generalized atrophy without lobar predilection. No chronic microhemorrhage. Normal midline structures. Vascular: Normal flow voids. Skull and upper cervical spine: Normal marrow signal. Sinuses/Orbits: Negative. Other: None. IMPRESSION: 1. No acute intracranial abnormality. 2. Generalized atrophy and findings of chronic ischemic microangiopathy. Electronically Signed: By: Ulyses Jarred M.D. On: 06/04/2019 23:15   EP PPM/ICD IMPLANT  Result Date: 06/06/2019 SURGEON:  [name], MD   PREPROCEDURE DIAGNOSIS:  Cryptogenic Stroke    POSTPROCEDURE DIAGNOSIS:  Cryptogenic Stroke    PROCEDURES:  1. Implantable loop recorder implantation   INTRODUCTION:  Erick Aye is a 69 y.o. male with a history of unexplained stroke who presents today for implantable loop implantation.  The patient has had a cryptogenic stroke.  Despite an extensive workup by neurology, no reversible causes have been identified.  he has worn telemetry during which he did not have arrhythmias.  There is significant concern for possible atrial fibrillation as the cause for the patients stroke.  The patient therefore presents today for implantable loop implantation.   DESCRIPTION OF PROCEDURE:  Informed written consent was obtained.  The patient required no sedation for the procedure today.  The patients left chest was prepped and draped. Mapping over the patient's chest was performed to identify the appropriate ILR site.  This area was found to be the left parasternal region over the 3rd-4th intercostal space.  The skin overlying this region was infiltrated with lidocaine for local analgesia.  A 0.5-cm incision was made at the implant site.  A subcutaneous ILR pocket was fashioned using a combination of sharp and blunt dissection.  A Medtronic Reveal Linq model [id number] implantable loop recorder was then placed into the pocket R waves were very prominent and measured > 0.2 mV. EBL<1 ml.  Steri- Strips and a sterile dressing were then applied.  There were no early apparent complications.   CONCLUSIONS:  1. Successful implantation of a Medtronic Reveal LINQ implantable loop recorder for cryptogenic stroke  2. No early apparent complications. [name] MD, [facility] 06/06/2019 11:07 AM   ECHOCARDIOGRAM COMPLETE  Result Date: 06/05/2019    ECHOCARDIOGRAM REPORT   Patient Name:   Jordynn Santalucia Date of Exam: 06/05/2019 Medical Rec #:  [id number]     Height:       75.0 in Accession #:    [id number]    Weight:       267.0 lb Date of Birth:  [birth year]     BSA:          2.48 m  Patient Age:     55 years      BP:           149/100 mmHg Patient Gender: M             HR:           86 bpm. Exam Location:  Inpatient Procedure: 2D Echo, Cardiac Doppler and Color Doppler Indications:    Stroke  History:        Patient has prior history of Echocardiogram examinations, most                 recent 06/26/2016. Abnormal ECG, Stroke, Signs/Symptoms:Dyspnea                 and Dizziness/Lightheadedness; Risk Factors:Diabetes,                 Hypertension, Sleep Apnea, Current Smoker and Dyslipidemia.  Sonographer:    Roseanna Rainbow RDCS Referring Phys: [place]  1. Left ventricular ejection fraction, by estimation, is 65 to 70%. The left ventricle has normal function. The left ventrical has no regional wall motion abnormalities. There is moderately increased left ventricular hypertrophy of the basal-septal segment. Left ventricular diastolic parameters were normal.  2. Right ventricular systolic function is normal. The right ventricular size is normal. Tricuspid regurgitation signal is inadequate for assessing PA pressure.  3. The mitral valve is normal in structure and function. no evidence of mitral valve regurgitation. No evidence of mitral stenosis.  4. The aortic valve is normal in structure and function. Aortic valve regurgitation is not visualized. No aortic stenosis is present.  5. Aortic dilatation noted. 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  6. The inferior vena cava is normal in size with <50% respiratory variability, suggesting right atrial pressure of 8 mmHg. FINDINGS  Left Ventricle: Left ventricular ejection fraction, by estimation, is 65 to 70%. The left ventricle has normal function. The left ventricle has no regional wall motion abnormalities. The left ventricular internal cavity size was normal in size. There is  moderately increased. There is moderately increased left ventricular hypertrophy of the basal-septal segment. Left ventricular diastolic  parameters were normal. Right Ventricle: The right ventricular size is normal. No increase in right ventricular wall thickness. Right ventricular systolic function is normal. Tricuspid regurgitation signal is inadequate for assessing PA pressure. Left Atrium: Left atrial size was normal in size. Right Atrium: Right atrial size was normal in size. Pericardium: There is no evidence of pericardial effusion. Mitral Valve: The mitral valve is normal in structure and function. Normal mobility of the mitral valve leaflets. No evidence of mitral valve regurgitation. No evidence of mitral valve stenosis. Tricuspid Valve: The tricuspid valve is normal in structure. Tricuspid valve regurgitation is not demonstrated. No evidence of tricuspid stenosis. Aortic Valve: The aortic valve is normal in structure and function. Aortic valve regurgitation is not visualized. No aortic stenosis is present. Pulmonic Valve: The pulmonic valve was normal in structure. Pulmonic valve regurgitation is not visualized. No evidence of pulmonic stenosis. Aorta: The aortic root is normal in size and structure and aortic dilatation noted. 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 Venous: The inferior vena cava is normal in size with less than 50% respiratory variability, suggesting right atrial pressure of 8 mmHg. The inferior vena cava and the hepatic vein show a normal flow pattern. IAS/Shunts: The interatrial septum appears to be lipomatous. No atrial level shunt detected by color  flow Doppler.  LEFT VENTRICLE PLAX 2D LVIDd:         3.60 cm       Diastology LVIDs:         2.68 cm       LV e' lateral:   10.30 cm/s LV PW:         1.41 cm       LV E/e' lateral: 6.8 LV IVS:        1.43 cm       LV e' medial:    6.31 cm/s LVOT diam:     2.10 cm       LV E/e' medial:  11.1 LV SV:         55.07 ml LV SV Index:   10.93 LVOT Area:     3.46 cm  LV Volumes (MOD) LV area d, A2C:    22.00 cm LV area d, A4C:    22.50 cm LV area s, A2C:     9.95 cm LV area s, A4C:    11.50 cm LV major d, A2C:   7.59 cm LV major d, A4C:   6.67 cm LV major s, A2C:   5.96 cm LV major s, A4C:   5.21 cm LV vol d, MOD A2C: 54.1 ml LV vol d, MOD A4C: 62.1 ml LV vol s, MOD A2C: 14.2 ml LV vol s, MOD A4C: 21.7 ml LV SV MOD A2C:     39.9 ml LV SV MOD A4C:     62.1 ml LV SV MOD BP:      42.9 ml RIGHT VENTRICLE            IVC RV S prime:     9.25 cm/s  IVC diam: 1.81 cm TAPSE (M-mode): 1.9 cm LEFT ATRIUM           Index       RIGHT ATRIUM           Index LA diam:      2.80 cm 1.13 cm/m  RA Area:     12.10 cm LA Vol (A2C): 65.9 ml 26.56 ml/m RA Volume:   26.50 ml  10.68 ml/m LA Vol (A4C): 26.7 ml 10.76 ml/m  AORTIC VALVE LVOT Vmax:   93.10 cm/s LVOT Vmean:  56.100 cm/s LVOT VTI:    0.159 m  AORTA Ao Root diam: 3.70 cm Ao Asc diam:  4.10 cm MITRAL VALVE MV Area (PHT): 4.31 cm             SHUNTS MV Decel Time: 176 msec             Systemic VTI:  0.16 m MV E velocity: 69.80 cm/s 103 cm/s  Systemic Diam: 2.10 cm MV A velocity: 80.10 cm/s 70.3 cm/s MV E/A ratio:  0.87       1.5 Fransico Him MD Electronically signed by Fransico Him MD Signature Date/Time: 06/05/2019/10:52:29 AM    Final    VAS [place] LOWER EXTREMITY VENOUS (DVT)  Result Date: 06/06/2019  Lower Venous DVTStudy Indications: Stroke.  Comparison Study: 08/28/2016- negative lower extremity venous duplex Performing Technologist: Maudry Mayhew MHA, RDMS, RVT, RDCS  Examination Guidelines: A complete evaluation includes B-mode imaging, spectral Doppler, color Doppler, and power Doppler as needed of all accessible portions of each vessel. Bilateral testing is considered an integral part of a complete examination. Limited examinations for reoccurring indications may be performed as noted. The reflux portion of the exam is performed with the patient in reverse Trendelenburg.  +---------+---------------+---------+-----------+----------+--------------+  RIGHT    CompressibilityPhasicitySpontaneityPropertiesThrombus  Aging +---------+---------------+---------+-----------+----------+--------------+ CFV      Full           Yes      Yes                                 +---------+---------------+---------+-----------+----------+--------------+ SFJ      Full                                                        +---------+---------------+---------+-----------+----------+--------------+ FV Prox  Full                                                        +---------+---------------+---------+-----------+----------+--------------+ FV Mid   Full                                                        +---------+---------------+---------+-----------+----------+--------------+ FV DistalFull                                                        +---------+---------------+---------+-----------+----------+--------------+ PFV      Full                                                        +---------+---------------+---------+-----------+----------+--------------+ POP      Full           Yes      Yes                                 +---------+---------------+---------+-----------+----------+--------------+ PTV      Full                                                        +---------+---------------+---------+-----------+----------+--------------+ PERO     Full                                                        +---------+---------------+---------+-----------+----------+--------------+   +---------+---------------+---------+-----------+----------+--------------+ LEFT     CompressibilityPhasicitySpontaneityPropertiesThrombus Aging +---------+---------------+---------+-----------+----------+--------------+ CFV      Full           Yes      Yes                                 +---------+---------------+---------+-----------+----------+--------------+  SFJ      Full                                                         +---------+---------------+---------+-----------+----------+--------------+ FV Prox  Full                                                        +---------+---------------+---------+-----------+----------+--------------+ FV Mid   Full                                                        +---------+---------------+---------+-----------+----------+--------------+ FV DistalFull                                                        +---------+---------------+---------+-----------+----------+--------------+ PFV      Full                                                        +---------+---------------+---------+-----------+----------+--------------+ POP      Full           Yes      Yes                                 +---------+---------------+---------+-----------+----------+--------------+ PTV      Full                                                        +---------+---------------+---------+-----------+----------+--------------+ PERO     Full                                                        +---------+---------------+---------+-----------+----------+--------------+     Summary: RIGHT: - There is no evidence of deep vein thrombosis in the lower extremity.  - No cystic structure found in the popliteal fossa.  LEFT: - There is no evidence of deep vein thrombosis in the lower extremity.  - No cystic structure found in the popliteal fossa.  *See table(s) above for measurements and observations. Electronically signed by Harold Barban MD on 06/06/2019 at 7:28:59 AM.    Final     PHYSICAL EXAM  Temp:  [98 F (36.7 C)-98.4 F (36.9 C)] 98.3 F (36.8 C) (02/11 1223) Pulse Rate:  [71-80] 77 (02/11 1223) Resp:  [16-19] 18 (02/11 1223)  BP: (98-131)/(71-83) 131/79 (02/11 1223) SpO2:  [98 %-100 %] 98 % (02/11 1223)  General - Well nourished, well developed, in no apparent distress.  Ophthalmologic - fundi not visualized due to  noncooperation.  Cardiovascular - Regular rhythm and rate.  Mental Status -  Level of arousal and orientation to time, place, and person were intact. Language including expression, naming, repetition, comprehension was assessed and found intact. Fund of Knowledge was assessed and was intact.  Cranial Nerves II - XII - II - Visual field intact OU. III, IV, VI - Extraocular movements intact. V - Facial sensation intact bilaterally. VII - Facial movement intact bilaterally. VIII - Hearing & vestibular intact bilaterally. X - Palate elevates symmetrically. XI - Chin turning & shoulder shrug intact bilaterally. XII - Tongue protrusion intact.  Motor Strength - The patient's strength was normal in all extremities and pronator drift was absent.  Bulk was normal and fasciculations were absent.   Motor Tone - Muscle tone was assessed at the neck and appendages and was normal.  Reflexes - The patient's reflexes were symmetrical in all extremities and he had no pathological reflexes.  Sensory - Light touch, temperature/pinprick were assessed and were symmetrical.    Coordination - The patient had normal movements in the hands and feet with no ataxia or dysmetria.  Tremor was absent.  Gait and Station - deferred.   ASSESSMENT/PLAN Mr. Zahkai Mershon is a 69 y.o. 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     MRI  B frontal lobe subcortical white matter subacute infarcts.    CTA head & neck no ELVO. Mild L ICA bifurcation   LE Doppler  No DVT  2D Echo EF 65-70%. No source of embolus   Loop recorder placed  LDL 56  HgbA1c 7.4   SCDs for VTE prophylaxis  clopidogrel 75 mg daily prior to admission, now on clopidogrel 75 mg daily and Bayer trial study  meds. Continue at discharge  Therapy recommendations:  None  Disposition:  Return home  Follow up Dr. Star Age at [facility]  Hypertension  Stable . BP goal normotensive  Hyperlipidemia  Home meds:  lipitor 40, resumed in hospital  LDL 56, goal < 70  Continue statin at discharge  Diabetes type II  HgbA1c 7.4, goal < 7.0  CBGs  SSI  Close PCP follow up  Tobacco abuse  Pt admitted cigar smoking from time to time  Smoking cessation counseling provided  Pt is willing to quit  Other Stroke Risk Factors  Advanced age  Hx stroke/TIA  11/2014 - asymptomatic L thalamic infarct per MRI done at [facility] in [place])  Obstructive sleep apnea, on CPAP at home  Hx DVT in 06/2016 after cervical spine surgery  Other Active Problems  CKD stage II  BPH on flomax  H/o Bell's Palsy  ED  Hospital day # 1  Neurology will sign off. Please call with questions. Pt will follow up with stroke clinic for research follow up and Dr. Rexene Alberts at [facility] in about 4 weeks. Thanks for the consult.   Rosalin Hawking, MD PhD Stroke Neurology 06/06/2019 2:11 PM    To contact Stroke Continuity provider, please refer to [email or web address] After hours, contact General Neurology

## 2019-06-06 NOTE — Progress Notes (Signed)
Patient being discharged home, spouse providing transportation. Educated patient on discharge instructions, questions [email or web address] Belongings returned to patient

## 2019-06-06 NOTE — H&P (View-Only) (Signed)
ELECTROPHYSIOLOGY CONSULT NOTE  Patient ID: Rodney Crane MRN: [id number], DOB/AGE: 69-23-1952   Admit date: 06/04/2019 Date of Consult: 06/06/2019  Primary Physician: Marton Redwood, MD Reason for Consultation: Cryptogenic stroke; recommendations regarding Implantable Loop Recorder  History of Present Illness EP has been asked to evaluate Rodney Crane for placement of an implantable loop recorder to monitor for atrial fibrillation by Dr Erlinda Hong.  The patient was admitted on 06/04/2019 with word finding difficulties.  They first developed symptoms while at home the morning of admission.  Imaging demonstrated 3-4 punctate MCA infarcts felt to be embolic 2/2 unknown source.  he has undergone workup for stroke including echocardiogram and carotid imaging.  The patient has been monitored on telemetry which has demonstrated sinus rhythm with no arrhythmias.   He has history of remote DVT. LE dopplers negative this admission.   Echocardiogram this admission demonstrated EF 65-70%, no RWMA, LA 28.  Lab work is reviewed.  Prior to admission, the patient denies chest pain, shortness of breath, dizziness, palpitations, or syncope.  They are recovering from their stroke with plans to return home at discharge.    Past Medical History:  Diagnosis Date  . Abdominal aortic ectasia ([place])   . Arthritis   . Cervical spondylosis   . Colon polyps   . CVA (cerebral infarction) 2010  . Diabetes mellitus   . Dizziness   . DVT (deep venous thrombosis) ([facility])    bilateral  . ED (erectile dysfunction)   . Foot drop   . H/O Bell's palsy   . Hyperlipidemia   . Hypertension   . Obesity   . OSA on CPAP   . Raynauds syndrome   . Restrictive lung disease   . Spinal stenosis   . Stroke ([place])   . Unsteady gait      Surgical History:  Past Surgical History:  Procedure Laterality Date  . CARPAL TUNNEL RELEASE Bilateral   . CERVICAL DISCECTOMY  2004  . LUMBAR DISC SURGERY     x6  . POSTERIOR CERVICAL  FUSION/FORAMINOTOMY N/A 06/27/2016   Procedure: POSTERIOR CERVICAL FUSION C2-C5; C2-C4 Decompression;  Surgeon: Melina Schools, MD;  Location: [facility];  Service: Orthopedics;  Laterality: N/A;     Medications Prior to Admission  Medication Sig Dispense Refill Last Dose  . atorvastatin (LIPITOR) 40 MG tablet Take 0.5 tablets (20 mg total) by mouth at bedtime. (Patient taking differently: Take 40 mg by mouth at bedtime. ) 30 tablet 7 06/03/2019 at Unknown time  . clopidogrel (PLAVIX) 75 MG tablet Take 75 mg by mouth daily.    06/03/2019 at Unknown time  . cyclobenzaprine (FLEXERIL) 10 MG tablet Take 1 tablet (10 mg total) by mouth 2 (two) times daily as needed for muscle spasms. (Patient taking differently: Take 10 mg by mouth every 8 (eight) hours as needed for muscle spasms. ) 12 tablet 0 Past Month at Unknown time  . meclizine (ANTIVERT) 25 MG tablet Take 25 mg by mouth 3 (three) times daily as needed for dizziness.   Past Week at Unknown time  . metFORMIN (GLUCOPHAGE) 1000 MG tablet Take 1,000 mg by mouth 2 (two) times daily with a meal.   06/03/2019 at Unknown time  . omeprazole (PRILOSEC) 20 MG capsule Take 1 capsule (20 mg total) by mouth daily. 30 capsule 11 06/03/2019 at Unknown time  . Oxycodone HCl 10 MG TABS Take 10 mg by mouth every 4 (four) hours as needed (for pain).   Past Month at Unknown time  .  polyethylene glycol (MIRALAX / GLYCOLAX) packet Take 17 g by mouth 2 (two) times daily. (Patient taking differently: Take 17 g by mouth daily. )   06/03/2019 at Unknown time  . sildenafil (REVATIO) 20 MG tablet Take 20-100 mg by mouth as needed (for erectile dysfunction).    Past Month at Unknown time  . sildenafil (VIAGRA) 100 MG tablet Take 100 mg by mouth daily as needed for erectile dysfunction.   Past Month at Unknown time  . valsartan-hydrochlorothiazide (DIOVAN-HCT) 160-12.5 MG tablet Take 1 tablet by mouth daily.  11 06/03/2019 at Unknown time  . ACCU-CHEK GUIDE test strip USE TO TEST BLOOD SUGAR  DAILY     . blood glucose meter kit and supplies KIT Dispense based on patient and insurance preference. Use up to four times daily as directed. (FOR ICD-9 250.00, 250.01). 1 each 0     Inpatient Medications:  . atorvastatin  40 mg Oral QHS  . clopidogrel  75 mg Oral Daily  . insulin aspart  0-5 Units Subcutaneous QHS  . insulin aspart  0-9 Units Subcutaneous TID WC  . pantoprazole  40 mg Oral Daily  . sodium chloride flush  3 mL Intravenous Once  . BAY [id number] or placebo ([place])  1 tablet Oral Daily   And  . BAY [id number] or placebo ([place])  1 tablet Oral Daily    Allergies: No Known Allergies  Social History   Socioeconomic History  . Marital status: Married    Spouse name: Nicki Reaper  . Number of children: 2  . Years of education: 79  . Highest education level: Not on file  Occupational History  . Occupation: RETIRED    Comment: retired  Tobacco Use  . Smoking status: Former Smoker    Types: Cigarettes    Quit date: 01/21/2011    Years since quitting: 8.3  . Smokeless tobacco: Never Used  . Tobacco comment: still smokes occasional cigars  Substance and Sexual Activity  . Alcohol use: No  . Drug use: No  . Sexual activity: Not on file  Other Topics Concern  . Not on file  Social History Narrative   Patient is right handed and resides with wife   Social Determinants of Health   Financial Resource Strain:   . Difficulty of Paying Living Expenses: Not on file  Food Insecurity:   . Worried About Charity fundraiser in the Last Year: Not on file  . Ran Out of Food in the Last Year: Not on file  Transportation Needs:   . Lack of Transportation (Medical): Not on file  . Lack of Transportation (Non-Medical): Not on file  Physical Activity:   . Days of Exercise per Week: Not on file  . Minutes of Exercise per Session: Not on file  Stress:   . Feeling of Stress : Not on file  Social Connections:   . Frequency of Communication with Friends and Family: Not  on file  . Frequency of Social Gatherings with Friends and Family: Not on file  . Attends Religious Services: Not on file  . Active Member of Clubs or Organizations: Not on file  . Attends Archivist Meetings: Not on file  . Marital Status: Not on file  Intimate Partner Violence:   . Fear of Current or Ex-Partner: Not on file  . Emotionally Abused: Not on file  . Physically Abused: Not on file  . Sexually Abused: Not on file     Family History  Problem  Relation Age of Onset  . Multiple sclerosis Sister   . Dementia Mother   . Diabetes Mother   . Lung cancer Father   . Sleep apnea Brother   . Hypertension Brother   . Colon cancer Neg Hx   . Stomach cancer Neg Hx       Review of Systems: All other systems reviewed and are otherwise negative except as noted above.  Physical Exam: Vitals:   06/05/19 1949 06/05/19 2321 06/06/19 0414 06/06/19 0800  BP: 128/74 128/79 121/71 98/78  Pulse: 78 71 74 80  Resp: '19 18 18 17  '$ Temp: 98.2 F (36.8 C) 98.1 F (36.7 C) 98.1 F (36.7 C) 98 F (36.7 C)  TempSrc: Oral Oral Oral Oral  SpO2: 100% 100% 100% 100%    GEN- The patient is well appearing, alert and oriented x 3 today.   Head- normocephalic, atraumatic Eyes-  Sclera clear, conjunctiva pink Ears- hearing intact Oropharynx- clear Neck- supple Lungs- Clear to ausculation bilaterally, normal work of breathing Heart- Regular rate and rhythm, no murmurs, rubs or gallops  GI- soft, NT, ND, + BS Extremities- no clubbing, cyanosis, or edema MS- no significant deformity or atrophy Skin- no rash or lesion Psych- euthymic mood, full affect   Labs:   Lab Results  Component Value Date   WBC 6.2 06/04/2019   HGB 12.6 (L) 06/04/2019   HCT 37.0 (L) 06/04/2019   MCV 90.8 06/04/2019   PLT 350 06/04/2019    Recent Labs  Lab 06/04/19 1704 06/04/19 1704 06/04/19 1725  NA 138   < > 136  K 4.4   < > 5.5*  CL 101   < > 102  CO2 24  --   --   BUN 18   < > 27*    CREATININE 1.28*   < > 1.30*  CALCIUM 9.7  --   --   PROT 8.3*  --   --   BILITOT 0.7  --   --   ALKPHOS 318*  --   --   ALT 33  --   --   AST 42*  --   --   GLUCOSE 126*   < > 126*   < > = values in this interval not displayed.     Radiology/Studies: CT Angio Head W or Wo Contrast  Result Date: 06/05/2019 CLINICAL DATA:  Stroke follow-up EXAM: CT ANGIOGRAPHY HEAD AND NECK TECHNIQUE: Multidetector CT imaging of the head and neck was performed using the standard protocol during bolus administration of intravenous contrast. Multiplanar CT image reconstructions and MIPs were obtained to evaluate the vascular anatomy. Carotid stenosis measurements (when applicable) are obtained utilizing NASCET criteria, using the distal internal carotid diameter as the denominator. CONTRAST:  41m OMNIPAQUE IOHEXOL 350 MG/ML SOLN COMPARISON:  Brain MRI 06/04/2019 FINDINGS: CTA NECK FINDINGS SKELETON: C4-7 anterior spinal fusion and C2-6 posterior instrumented fusion. OTHER NECK: Normal pharynx, larynx and major salivary glands. No cervical lymphadenopathy. Unremarkable thyroid gland. UPPER CHEST: No pneumothorax or pleural effusion. No nodules or masses. AORTIC ARCH: There is no calcific atherosclerosis of the aortic arch. There is no aneurysm, dissection or hemodynamically significant stenosis of the visualized portion of the aorta. Conventional 3 vessel aortic branching pattern. The visualized proximal subclavian arteries are widely patent. RIGHT CAROTID SYSTEM: Normal without aneurysm, dissection or stenosis. LEFT CAROTID SYSTEM: No dissection, occlusion or aneurysm. Mild atherosclerotic calcification at the carotid bifurcation without hemodynamically significant stenosis. VERTEBRAL ARTERIES: Left dominant configuration. Both origins are clearly  patent. There is no dissection, occlusion or flow-limiting stenosis to the skull base (V1-V3 segments). CTA HEAD FINDINGS POSTERIOR CIRCULATION: --Vertebral arteries: Minimal  atherosclerotic calcification in the left V4 segment. --Posterior inferior cerebellar arteries (PICA): Patent origins from the vertebral arteries. --Anterior inferior cerebellar arteries (AICA): Patent origins from the basilar artery. --Basilar artery: Normal. --Superior cerebellar arteries: Normal. --Posterior cerebral arteries: Normal. Both originate from the basilar artery. Posterior communicating arteries (p-comm) are diminutive or absent. ANTERIOR CIRCULATION: --Intracranial internal carotid arteries: Normal. --Anterior cerebral arteries (ACA): Normal. Both A1 segments are present. Patent anterior communicating artery (a-comm). --Middle cerebral arteries (MCA): Normal. VENOUS SINUSES: As permitted by contrast timing, patent. ANATOMIC VARIANTS: None Review of the MIP images confirms the above findings. IMPRESSION: 1. No emergent large vessel occlusion or hemodynamically significant stenosis of the head or neck. 2. Mild left carotid bifurcation atherosclerotic calcification without hemodynamically significant stenosis. Electronically Signed   By: Ulyses Jarred M.D.   On: 06/05/2019 01:44   DG Chest 2 View  Result Date: 06/05/2019 CLINICAL DATA:  Stroke EXAM: CHEST - 2 VIEW COMPARISON:  06/29/2016 FINDINGS: Normal heart size and stable mediastinal contours. Chronic interstitial coarsening at the bases with interstitial opacity seen since 2012 chest CT. There is no edema, consolidation, effusion, or pneumothorax. Cervical fusion hardware. Thoracic spondylosis with bridging osteophytes. IMPRESSION: Chronic lung disease without acute superimposed finding. Electronically Signed   By: Monte Fantasia M.D.   On: 06/05/2019 07:02   CT HEAD WO CONTRAST  Result Date: 06/04/2019 CLINICAL DATA:  Slurred speech, memory loss, lack of concentration, symptoms since 9 a.m. today EXAM: CT HEAD WITHOUT CONTRAST TECHNIQUE: Contiguous axial images were obtained from the base of the skull through the vertex without intravenous  contrast. COMPARISON:  06/25/2016 FINDINGS: Brain: Dilated perivascular space versus chronic lacunar infarct left thalamus unchanged. No acute infarct or hemorrhage. Lateral ventricles and remaining midline structures are unremarkable. No acute extra-axial fluid collections. No mass effect. Vascular: No hyperdense vessel or unexpected calcification. Skull: Normal. Negative for fracture or focal lesion. Sinuses/Orbits: No acute finding. Other: None IMPRESSION: 1. No acute intracranial process.  The Electronically Signed   By: Randa Ngo M.D.   On: 06/04/2019 18:23   CT Angio Neck W and/or Wo Contrast  Result Date: 06/05/2019 CLINICAL DATA:  Stroke follow-up EXAM: CT ANGIOGRAPHY HEAD AND NECK TECHNIQUE: Multidetector CT imaging of the head and neck was performed using the standard protocol during bolus administration of intravenous contrast. Multiplanar CT image reconstructions and MIPs were obtained to evaluate the vascular anatomy. Carotid stenosis measurements (when applicable) are obtained utilizing NASCET criteria, using the distal internal carotid diameter as the denominator. CONTRAST:  43m OMNIPAQUE IOHEXOL 350 MG/ML SOLN COMPARISON:  Brain MRI 06/04/2019 FINDINGS: CTA NECK FINDINGS SKELETON: C4-7 anterior spinal fusion and C2-6 posterior instrumented fusion. OTHER NECK: Normal pharynx, larynx and major salivary glands. No cervical lymphadenopathy. Unremarkable thyroid gland. UPPER CHEST: No pneumothorax or pleural effusion. No nodules or masses. AORTIC ARCH: There is no calcific atherosclerosis of the aortic arch. There is no aneurysm, dissection or hemodynamically significant stenosis of the visualized portion of the aorta. Conventional 3 vessel aortic branching pattern. The visualized proximal subclavian arteries are widely patent. RIGHT CAROTID SYSTEM: Normal without aneurysm, dissection or stenosis. LEFT CAROTID SYSTEM: No dissection, occlusion or aneurysm. Mild atherosclerotic calcification at  the carotid bifurcation without hemodynamically significant stenosis. VERTEBRAL ARTERIES: Left dominant configuration. Both origins are clearly patent. There is no dissection, occlusion or flow-limiting stenosis to the skull base (V1-V3 segments).  CTA HEAD FINDINGS POSTERIOR CIRCULATION: --Vertebral arteries: Minimal atherosclerotic calcification in the left V4 segment. --Posterior inferior cerebellar arteries (PICA): Patent origins from the vertebral arteries. --Anterior inferior cerebellar arteries (AICA): Patent origins from the basilar artery. --Basilar artery: Normal. --Superior cerebellar arteries: Normal. --Posterior cerebral arteries: Normal. Both originate from the basilar artery. Posterior communicating arteries (p-comm) are diminutive or absent. ANTERIOR CIRCULATION: --Intracranial internal carotid arteries: Normal. --Anterior cerebral arteries (ACA): Normal. Both A1 segments are present. Patent anterior communicating artery (a-comm). --Middle cerebral arteries (MCA): Normal. VENOUS SINUSES: As permitted by contrast timing, patent. ANATOMIC VARIANTS: None Review of the MIP images confirms the above findings. IMPRESSION: 1. No emergent large vessel occlusion or hemodynamically significant stenosis of the head or neck. 2. Mild left carotid bifurcation atherosclerotic calcification without hemodynamically significant stenosis. Electronically Signed   By: Ulyses Jarred M.D.   On: 06/05/2019 01:44   MR BRAIN WO CONTRAST  Addendum Date: 06/04/2019   ADDENDUM REPORT: 06/04/2019 23:41 ADDENDUM: I discussed the case with Dr. Rory Percy at 11:30 p.m. on 06/04/2019. There are actually a few punctate foci of hyperintensity on diffusion-weighted imaging within the subcortical white matter of both frontal lobes (series 3 images 36, 39 and 42). There is no clear correlate on the ADC map. The appearance is most compatible with recent, but subacute, punctate infarcts. Evaluation for possible embolic sources may be  appropriate. Electronically Signed   By: Ulyses Jarred M.D.   On: 06/04/2019 23:41   Result Date: 06/04/2019 CLINICAL DATA:  Slurred speech and perioral numbness EXAM: MRI HEAD WITHOUT CONTRAST TECHNIQUE: Multiplanar, multiecho pulse sequences of the brain and surrounding structures were obtained without intravenous contrast. COMPARISON:  Brain MRI 06/25/2016 FINDINGS: Brain: No acute infarct, acute hemorrhage or extra-axial collection. Multifocal white matter hyperintensity, most commonly due to chronic ischemic microangiopathy. There is generalized atrophy without lobar predilection. No chronic microhemorrhage. Normal midline structures. Vascular: Normal flow voids. Skull and upper cervical spine: Normal marrow signal. Sinuses/Orbits: Negative. Other: None. IMPRESSION: 1. No acute intracranial abnormality. 2. Generalized atrophy and findings of chronic ischemic microangiopathy. Electronically Signed: By: Ulyses Jarred M.D. On: 06/04/2019 23:15   ECHOCARDIOGRAM COMPLETE  Result Date: 06/05/2019    ECHOCARDIOGRAM REPORT   Patient Name:   Kiaan Delosreyes Date of Exam: 06/05/2019 Medical Rec #:  [id number]     Height:       75.0 in Accession #:    [id number]    Weight:       267.0 lb Date of Birth:  05-Nov-1950     BSA:          2.48 m Patient Age:    26 years      BP:           149/100 mmHg Patient Gender: M             HR:           86 bpm. Exam Location:  Inpatient Procedure: 2D Echo, Cardiac Doppler and Color Doppler Indications:    Stroke  History:        Patient has prior history of Echocardiogram examinations, most                 recent 06/26/2016. Abnormal ECG, Stroke, Signs/Symptoms:Dyspnea                 and Dizziness/Lightheadedness; Risk Factors:Diabetes,                 Hypertension, Sleep Apnea, Current Smoker and Dyslipidemia.  Sonographer:  Roseanna Rainbow RDCS Referring Phys: [id number] [place]  1. Left ventricular ejection fraction, by estimation, is 65 to 70%. The left ventricle has  normal function. The left ventrical has no regional wall motion abnormalities. There is moderately increased left ventricular hypertrophy of the basal-septal segment. Left ventricular diastolic parameters were normal.  2. Right ventricular systolic function is normal. The right ventricular size is normal. Tricuspid regurgitation signal is inadequate for assessing PA pressure.  3. The mitral valve is normal in structure and function. no evidence of mitral valve regurgitation. No evidence of mitral stenosis.  4. The aortic valve is normal in structure and function. Aortic valve regurgitation is not visualized. No aortic stenosis is present.  5. Aortic dilatation noted. 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  6. The inferior vena cava is normal in size with <50% respiratory variability, suggesting right atrial pressure of 8 mmHg. FINDINGS  Left Ventricle: Left ventricular ejection fraction, by estimation, is 65 to 70%. The left ventricle has normal function. The left ventricle has no regional wall motion abnormalities. The left ventricular internal cavity size was normal in size. There is  moderately increased. There is moderately increased left ventricular hypertrophy of the basal-septal segment. Left ventricular diastolic parameters were normal. Right Ventricle: The right ventricular size is normal. No increase in right ventricular wall thickness. Right ventricular systolic function is normal. Tricuspid regurgitation signal is inadequate for assessing PA pressure. Left Atrium: Left atrial size was normal in size. Right Atrium: Right atrial size was normal in size. Pericardium: There is no evidence of pericardial effusion. Mitral Valve: The mitral valve is normal in structure and function. Normal mobility of the mitral valve leaflets. No evidence of mitral valve regurgitation. No evidence of mitral valve stenosis. Tricuspid Valve: The tricuspid valve is normal in structure. Tricuspid valve  regurgitation is not demonstrated. No evidence of tricuspid stenosis. Aortic Valve: The aortic valve is normal in structure and function. Aortic valve regurgitation is not visualized. No aortic stenosis is present. Pulmonic Valve: The pulmonic valve was normal in structure. Pulmonic valve regurgitation is not visualized. No evidence of pulmonic stenosis. Aorta: The aortic root is normal in size and structure and aortic dilatation noted. 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 Venous: The inferior vena cava is normal in size with less than 50% respiratory variability, suggesting right atrial pressure of 8 mmHg. The inferior vena cava and the hepatic vein show a normal flow pattern. IAS/Shunts: The interatrial septum appears to be lipomatous. No atrial level shunt detected by color flow Doppler.  LEFT VENTRICLE PLAX 2D LVIDd:         3.60 cm       Diastology LVIDs:         2.68 cm       LV e' lateral:   10.30 cm/s LV PW:         1.41 cm       LV E/e' lateral: 6.8 LV IVS:        1.43 cm       LV e' medial:    6.31 cm/s LVOT diam:     2.10 cm       LV E/e' medial:  11.1 LV SV:         55.07 ml LV SV Index:   10.93 LVOT Area:     3.46 cm  LV Volumes (MOD) LV area d, A2C:    22.00 cm LV area d,  A4C:    22.50 cm LV area s, A2C:    9.95 cm LV area s, A4C:    11.50 cm LV major d, A2C:   7.59 cm LV major d, A4C:   6.67 cm LV major s, A2C:   5.96 cm LV major s, A4C:   5.21 cm LV vol d, MOD A2C: 54.1 ml LV vol d, MOD A4C: 62.1 ml LV vol s, MOD A2C: 14.2 ml LV vol s, MOD A4C: 21.7 ml LV SV MOD A2C:     39.9 ml LV SV MOD A4C:     62.1 ml LV SV MOD BP:      42.9 ml RIGHT VENTRICLE            IVC RV S prime:     9.25 cm/s  IVC diam: 1.81 cm TAPSE (M-mode): 1.9 cm LEFT ATRIUM           Index       RIGHT ATRIUM           Index LA diam:      2.80 cm 1.13 cm/m  RA Area:     12.10 cm LA Vol (A2C): 65.9 ml 26.56 ml/m RA Volume:   26.50 ml  10.68 ml/m LA Vol (A4C): 26.7 ml 10.76 ml/m  AORTIC VALVE LVOT  Vmax:   93.10 cm/s LVOT Vmean:  56.100 cm/s LVOT VTI:    0.159 m  AORTA Ao Root diam: 3.70 cm Ao Asc diam:  4.10 cm MITRAL VALVE MV Area (PHT): 4.31 cm             SHUNTS MV Decel Time: 176 msec             Systemic VTI:  0.16 m MV E velocity: 69.80 cm/s 103 cm/s  Systemic Diam: 2.10 cm MV A velocity: 80.10 cm/s 70.3 cm/s MV E/A ratio:  0.87       1.5 Fransico Him MD Electronically signed by Fransico Him MD Signature Date/Time: 06/05/2019/10:52:29 AM    Final    VAS [place] LOWER EXTREMITY VENOUS (DVT)  Result Date: 06/06/2019  Lower Venous DVTStudy Indications: Stroke.  Comparison Study: 08/28/2016- negative lower extremity venous duplex Performing Technologist: Maudry Mayhew MHA, RDMS, RVT, RDCS  Examination Guidelines: A complete evaluation includes B-mode imaging, spectral Doppler, color Doppler, and power Doppler as needed of all accessible portions of each vessel. Bilateral testing is considered an integral part of a complete examination. Limited examinations for reoccurring indications may be performed as noted. The reflux portion of the exam is performed with the patient in reverse Trendelenburg.  +---------+---------------+---------+-----------+----------+--------------+ RIGHT    CompressibilityPhasicitySpontaneityPropertiesThrombus Aging +---------+---------------+---------+-----------+----------+--------------+ CFV      Full           Yes      Yes                                 +---------+---------------+---------+-----------+----------+--------------+ SFJ      Full                                                        +---------+---------------+---------+-----------+----------+--------------+ FV Prox  Full                                                        +---------+---------------+---------+-----------+----------+--------------+  FV Mid   Full                                                         +---------+---------------+---------+-----------+----------+--------------+ FV DistalFull                                                        +---------+---------------+---------+-----------+----------+--------------+ PFV      Full                                                        +---------+---------------+---------+-----------+----------+--------------+ POP      Full           Yes      Yes                                 +---------+---------------+---------+-----------+----------+--------------+ PTV      Full                                                        +---------+---------------+---------+-----------+----------+--------------+ PERO     Full                                                        +---------+---------------+---------+-----------+----------+--------------+   +---------+---------------+---------+-----------+----------+--------------+ LEFT     CompressibilityPhasicitySpontaneityPropertiesThrombus Aging +---------+---------------+---------+-----------+----------+--------------+ CFV      Full           Yes      Yes                                 +---------+---------------+---------+-----------+----------+--------------+ SFJ      Full                                                        +---------+---------------+---------+-----------+----------+--------------+ FV Prox  Full                                                        +---------+---------------+---------+-----------+----------+--------------+ FV Mid   Full                                                        +---------+---------------+---------+-----------+----------+--------------+  FV DistalFull                                                        +---------+---------------+---------+-----------+----------+--------------+ PFV      Full                                                         +---------+---------------+---------+-----------+----------+--------------+ POP      Full           Yes      Yes                                 +---------+---------------+---------+-----------+----------+--------------+ PTV      Full                                                        +---------+---------------+---------+-----------+----------+--------------+ PERO     Full                                                        +---------+---------------+---------+-----------+----------+--------------+     Summary: RIGHT: - There is no evidence of deep vein thrombosis in the lower extremity.  - No cystic structure found in the popliteal fossa.  LEFT: - There is no evidence of deep vein thrombosis in the lower extremity.  - No cystic structure found in the popliteal fossa.  *See table(s) above for measurements and observations. Electronically signed by Harold Barban MD on 06/06/2019 at 7:28:59 AM.    Final     12-lead ECG sinus rhythm (personally reviewed) All prior EKG's in EPIC reviewed with no documented atrial fibrillation  Telemetry SR/ST (personally reviewed)  Assessment and Plan:  1. Cryptogenic stroke The patient presents with cryptogenic stroke.  I spoke at length with the patient about monitoring for afib with an implantable loop recorder.  Risks, benefits, and alteratives to implantable loop recorder were discussed with the patient today.   At this time, the patient is very clear in their decision to proceed with implantable loop recorder.   Wound care was reviewed with the patient (keep incision clean and dry for 3 days).  Wound check scheduled and entered in AVS.  Please call with questions.   Chanetta Marshall, NP 06/06/2019 8:52 AM  I have seen, examined the patient, and reviewed the above assessment and plan.  Changes to above are made where necessary.  On exam, RRR.    The patient presents with cryptogenic stroke.  I spoke at length with the patient about  monitoring for afib with an implantable loop recorder.  Risks, benefits, and alteratives to implantable loop recorder were discussed with the patient today.   At this time, the patient is very clear in their decision to proceed with implantable loop recorder.   Please  call with questions.   Co Sign: [name], MD 06/06/2019 10:54 AM

## 2019-06-06 NOTE — Consult Note (Addendum)
ELECTROPHYSIOLOGY CONSULT NOTE  Patient ID: Rodney Crane MRN: [id number], DOB/AGE: 07/10/1950   Admit date: 06/04/2019 Date of Consult: 06/06/2019  Primary Physician: Marton Redwood, MD Reason for Consultation: Cryptogenic stroke; recommendations regarding Implantable Loop Recorder  History of Present Illness EP has been asked to evaluate Rodney Crane for placement of an implantable loop recorder to monitor for atrial fibrillation by Dr Erlinda Hong.  The patient was admitted on 06/04/2019 with word finding difficulties.  They first developed symptoms while at home the morning of admission.  Imaging demonstrated 3-4 punctate MCA infarcts felt to be embolic 2/2 unknown source.  he has undergone workup for stroke including echocardiogram and carotid imaging.  The patient has been monitored on telemetry which has demonstrated sinus rhythm with no arrhythmias.   He has history of remote DVT. LE dopplers negative this admission.   Echocardiogram this admission demonstrated EF 65-70%, no RWMA, LA 28.  Lab work is reviewed.  Prior to admission, the patient denies chest pain, shortness of breath, dizziness, palpitations, or syncope.  They are recovering from their stroke with plans to return home at discharge.    Past Medical History:  Diagnosis Date  . Abdominal aortic ectasia ([place])   . Arthritis   . Cervical spondylosis   . Colon polyps   . CVA (cerebral infarction) 2010  . Diabetes mellitus   . Dizziness   . DVT (deep venous thrombosis) ([facility])    bilateral  . ED (erectile dysfunction)   . Foot drop   . H/O Bell's palsy   . Hyperlipidemia   . Hypertension   . Obesity   . OSA on CPAP   . Raynauds syndrome   . Restrictive lung disease   . Spinal stenosis   . Stroke ([place])   . Unsteady gait      Surgical History:  Past Surgical History:  Procedure Laterality Date  . CARPAL TUNNEL RELEASE Bilateral   . CERVICAL DISCECTOMY  2004  . LUMBAR DISC SURGERY     x6  . POSTERIOR CERVICAL  FUSION/FORAMINOTOMY N/A 06/27/2016   Procedure: POSTERIOR CERVICAL FUSION C2-C5; C2-C4 Decompression;  Surgeon: Melina Schools, MD;  Location: [facility];  Service: Orthopedics;  Laterality: N/A;     Medications Prior to Admission  Medication Sig Dispense Refill Last Dose  . atorvastatin (LIPITOR) 40 MG tablet Take 0.5 tablets (20 mg total) by mouth at bedtime. (Patient taking differently: Take 40 mg by mouth at bedtime. ) 30 tablet 7 06/03/2019 at Unknown time  . clopidogrel (PLAVIX) 75 MG tablet Take 75 mg by mouth daily.    06/03/2019 at Unknown time  . cyclobenzaprine (FLEXERIL) 10 MG tablet Take 1 tablet (10 mg total) by mouth 2 (two) times daily as needed for muscle spasms. (Patient taking differently: Take 10 mg by mouth every 8 (eight) hours as needed for muscle spasms. ) 12 tablet 0 Past Month at Unknown time  . meclizine (ANTIVERT) 25 MG tablet Take 25 mg by mouth 3 (three) times daily as needed for dizziness.   Past Week at Unknown time  . metFORMIN (GLUCOPHAGE) 1000 MG tablet Take 1,000 mg by mouth 2 (two) times daily with a meal.   06/03/2019 at Unknown time  . omeprazole (PRILOSEC) 20 MG capsule Take 1 capsule (20 mg total) by mouth daily. 30 capsule 11 06/03/2019 at Unknown time  . Oxycodone HCl 10 MG TABS Take 10 mg by mouth every 4 (four) hours as needed (for pain).   Past Month at Unknown time  .  polyethylene glycol (MIRALAX / GLYCOLAX) packet Take 17 g by mouth 2 (two) times daily. (Patient taking differently: Take 17 g by mouth daily. )   06/03/2019 at Unknown time  . sildenafil (REVATIO) 20 MG tablet Take 20-100 mg by mouth as needed (for erectile dysfunction).    Past Month at Unknown time  . sildenafil (VIAGRA) 100 MG tablet Take 100 mg by mouth daily as needed for erectile dysfunction.   Past Month at Unknown time  . valsartan-hydrochlorothiazide (DIOVAN-HCT) 160-12.5 MG tablet Take 1 tablet by mouth daily.  11 06/03/2019 at Unknown time  . ACCU-CHEK GUIDE test strip USE TO TEST BLOOD SUGAR  DAILY     . blood glucose meter kit and supplies KIT Dispense based on patient and insurance preference. Use up to four times daily as directed. (FOR ICD-9 250.00, 250.01). 1 each 0     Inpatient Medications:  . atorvastatin  40 mg Oral QHS  . clopidogrel  75 mg Oral Daily  . insulin aspart  0-5 Units Subcutaneous QHS  . insulin aspart  0-9 Units Subcutaneous TID WC  . pantoprazole  40 mg Oral Daily  . sodium chloride flush  3 mL Intravenous Once  . BAY [id number] or placebo ([place])  1 tablet Oral Daily   And  . BAY [id number] or placebo ([place])  1 tablet Oral Daily    Allergies: No Known Allergies  Social History   Socioeconomic History  . Marital status: Married    Spouse name: Nicki Reaper  . Number of children: 2  . Years of education: 62  . Highest education level: Not on file  Occupational History  . Occupation: RETIRED    Comment: retired  Tobacco Use  . Smoking status: Former Smoker    Types: Cigarettes    Quit date: 01/21/2011    Years since quitting: 8.3  . Smokeless tobacco: Never Used  . Tobacco comment: still smokes occasional cigars  Substance and Sexual Activity  . Alcohol use: No  . Drug use: No  . Sexual activity: Not on file  Other Topics Concern  . Not on file  Social History Narrative   Patient is right handed and resides with wife   Social Determinants of Health   Financial Resource Strain:   . Difficulty of Paying Living Expenses: Not on file  Food Insecurity:   . Worried About Charity fundraiser in the Last Year: Not on file  . Ran Out of Food in the Last Year: Not on file  Transportation Needs:   . Lack of Transportation (Medical): Not on file  . Lack of Transportation (Non-Medical): Not on file  Physical Activity:   . Days of Exercise per Week: Not on file  . Minutes of Exercise per Session: Not on file  Stress:   . Feeling of Stress : Not on file  Social Connections:   . Frequency of Communication with Friends and Family: Not  on file  . Frequency of Social Gatherings with Friends and Family: Not on file  . Attends Religious Services: Not on file  . Active Member of Clubs or Organizations: Not on file  . Attends Archivist Meetings: Not on file  . Marital Status: Not on file  Intimate Partner Violence:   . Fear of Current or Ex-Partner: Not on file  . Emotionally Abused: Not on file  . Physically Abused: Not on file  . Sexually Abused: Not on file     Family History  Problem  Relation Age of Onset  . Multiple sclerosis Sister   . Dementia Mother   . Diabetes Mother   . Lung cancer Father   . Sleep apnea Brother   . Hypertension Brother   . Colon cancer Neg Hx   . Stomach cancer Neg Hx       Review of Systems: All other systems reviewed and are otherwise negative except as noted above.  Physical Exam: Vitals:   06/05/19 1949 06/05/19 2321 06/06/19 0414 06/06/19 0800  BP: 128/74 128/79 121/71 98/78  Pulse: 78 71 74 80  Resp: '19 18 18 17  '$ Temp: 98.2 F (36.8 C) 98.1 F (36.7 C) 98.1 F (36.7 C) 98 F (36.7 C)  TempSrc: Oral Oral Oral Oral  SpO2: 100% 100% 100% 100%    GEN- The patient is well appearing, alert and oriented x 3 today.   Head- normocephalic, atraumatic Eyes-  Sclera clear, conjunctiva pink Ears- hearing intact Oropharynx- clear Neck- supple Lungs- Clear to ausculation bilaterally, normal work of breathing Heart- Regular rate and rhythm, no murmurs, rubs or gallops  GI- soft, NT, ND, + BS Extremities- no clubbing, cyanosis, or edema MS- no significant deformity or atrophy Skin- no rash or lesion Psych- euthymic mood, full affect   Labs:   Lab Results  Component Value Date   WBC 6.2 06/04/2019   HGB 12.6 (L) 06/04/2019   HCT 37.0 (L) 06/04/2019   MCV 90.8 06/04/2019   PLT 350 06/04/2019    Recent Labs  Lab 06/04/19 1704 06/04/19 1704 06/04/19 1725  NA 138   < > 136  K 4.4   < > 5.5*  CL 101   < > 102  CO2 24  --   --   BUN 18   < > 27*    CREATININE 1.28*   < > 1.30*  CALCIUM 9.7  --   --   PROT 8.3*  --   --   BILITOT 0.7  --   --   ALKPHOS 318*  --   --   ALT 33  --   --   AST 42*  --   --   GLUCOSE 126*   < > 126*   < > = values in this interval not displayed.     Radiology/Studies: CT Angio Head W or Wo Contrast  Result Date: 06/05/2019 CLINICAL DATA:  Stroke follow-up EXAM: CT ANGIOGRAPHY HEAD AND NECK TECHNIQUE: Multidetector CT imaging of the head and neck was performed using the standard protocol during bolus administration of intravenous contrast. Multiplanar CT image reconstructions and MIPs were obtained to evaluate the vascular anatomy. Carotid stenosis measurements (when applicable) are obtained utilizing NASCET criteria, using the distal internal carotid diameter as the denominator. CONTRAST:  58m OMNIPAQUE IOHEXOL 350 MG/ML SOLN COMPARISON:  Brain MRI 06/04/2019 FINDINGS: CTA NECK FINDINGS SKELETON: C4-7 anterior spinal fusion and C2-6 posterior instrumented fusion. OTHER NECK: Normal pharynx, larynx and major salivary glands. No cervical lymphadenopathy. Unremarkable thyroid gland. UPPER CHEST: No pneumothorax or pleural effusion. No nodules or masses. AORTIC ARCH: There is no calcific atherosclerosis of the aortic arch. There is no aneurysm, dissection or hemodynamically significant stenosis of the visualized portion of the aorta. Conventional 3 vessel aortic branching pattern. The visualized proximal subclavian arteries are widely patent. RIGHT CAROTID SYSTEM: Normal without aneurysm, dissection or stenosis. LEFT CAROTID SYSTEM: No dissection, occlusion or aneurysm. Mild atherosclerotic calcification at the carotid bifurcation without hemodynamically significant stenosis. VERTEBRAL ARTERIES: Left dominant configuration. Both origins are clearly  patent. There is no dissection, occlusion or flow-limiting stenosis to the skull base (V1-V3 segments). CTA HEAD FINDINGS POSTERIOR CIRCULATION: --Vertebral arteries: Minimal  atherosclerotic calcification in the left V4 segment. --Posterior inferior cerebellar arteries (PICA): Patent origins from the vertebral arteries. --Anterior inferior cerebellar arteries (AICA): Patent origins from the basilar artery. --Basilar artery: Normal. --Superior cerebellar arteries: Normal. --Posterior cerebral arteries: Normal. Both originate from the basilar artery. Posterior communicating arteries (p-comm) are diminutive or absent. ANTERIOR CIRCULATION: --Intracranial internal carotid arteries: Normal. --Anterior cerebral arteries (ACA): Normal. Both A1 segments are present. Patent anterior communicating artery (a-comm). --Middle cerebral arteries (MCA): Normal. VENOUS SINUSES: As permitted by contrast timing, patent. ANATOMIC VARIANTS: None Review of the MIP images confirms the above findings. IMPRESSION: 1. No emergent large vessel occlusion or hemodynamically significant stenosis of the head or neck. 2. Mild left carotid bifurcation atherosclerotic calcification without hemodynamically significant stenosis. Electronically Signed   By: Ulyses Jarred M.D.   On: 06/05/2019 01:44   DG Chest 2 View  Result Date: 06/05/2019 CLINICAL DATA:  Stroke EXAM: CHEST - 2 VIEW COMPARISON:  06/29/2016 FINDINGS: Normal heart size and stable mediastinal contours. Chronic interstitial coarsening at the bases with interstitial opacity seen since 2012 chest CT. There is no edema, consolidation, effusion, or pneumothorax. Cervical fusion hardware. Thoracic spondylosis with bridging osteophytes. IMPRESSION: Chronic lung disease without acute superimposed finding. Electronically Signed   By: Monte Fantasia M.D.   On: 06/05/2019 07:02   CT HEAD WO CONTRAST  Result Date: 06/04/2019 CLINICAL DATA:  Slurred speech, memory loss, lack of concentration, symptoms since 9 a.m. today EXAM: CT HEAD WITHOUT CONTRAST TECHNIQUE: Contiguous axial images were obtained from the base of the skull through the vertex without intravenous  contrast. COMPARISON:  06/25/2016 FINDINGS: Brain: Dilated perivascular space versus chronic lacunar infarct left thalamus unchanged. No acute infarct or hemorrhage. Lateral ventricles and remaining midline structures are unremarkable. No acute extra-axial fluid collections. No mass effect. Vascular: No hyperdense vessel or unexpected calcification. Skull: Normal. Negative for fracture or focal lesion. Sinuses/Orbits: No acute finding. Other: None IMPRESSION: 1. No acute intracranial process.  The Electronically Signed   By: Randa Ngo M.D.   On: 06/04/2019 18:23   CT Angio Neck W and/or Wo Contrast  Result Date: 06/05/2019 CLINICAL DATA:  Stroke follow-up EXAM: CT ANGIOGRAPHY HEAD AND NECK TECHNIQUE: Multidetector CT imaging of the head and neck was performed using the standard protocol during bolus administration of intravenous contrast. Multiplanar CT image reconstructions and MIPs were obtained to evaluate the vascular anatomy. Carotid stenosis measurements (when applicable) are obtained utilizing NASCET criteria, using the distal internal carotid diameter as the denominator. CONTRAST:  66m OMNIPAQUE IOHEXOL 350 MG/ML SOLN COMPARISON:  Brain MRI 06/04/2019 FINDINGS: CTA NECK FINDINGS SKELETON: C4-7 anterior spinal fusion and C2-6 posterior instrumented fusion. OTHER NECK: Normal pharynx, larynx and major salivary glands. No cervical lymphadenopathy. Unremarkable thyroid gland. UPPER CHEST: No pneumothorax or pleural effusion. No nodules or masses. AORTIC ARCH: There is no calcific atherosclerosis of the aortic arch. There is no aneurysm, dissection or hemodynamically significant stenosis of the visualized portion of the aorta. Conventional 3 vessel aortic branching pattern. The visualized proximal subclavian arteries are widely patent. RIGHT CAROTID SYSTEM: Normal without aneurysm, dissection or stenosis. LEFT CAROTID SYSTEM: No dissection, occlusion or aneurysm. Mild atherosclerotic calcification at  the carotid bifurcation without hemodynamically significant stenosis. VERTEBRAL ARTERIES: Left dominant configuration. Both origins are clearly patent. There is no dissection, occlusion or flow-limiting stenosis to the skull base (V1-V3 segments).  CTA HEAD FINDINGS POSTERIOR CIRCULATION: --Vertebral arteries: Minimal atherosclerotic calcification in the left V4 segment. --Posterior inferior cerebellar arteries (PICA): Patent origins from the vertebral arteries. --Anterior inferior cerebellar arteries (AICA): Patent origins from the basilar artery. --Basilar artery: Normal. --Superior cerebellar arteries: Normal. --Posterior cerebral arteries: Normal. Both originate from the basilar artery. Posterior communicating arteries (p-comm) are diminutive or absent. ANTERIOR CIRCULATION: --Intracranial internal carotid arteries: Normal. --Anterior cerebral arteries (ACA): Normal. Both A1 segments are present. Patent anterior communicating artery (a-comm). --Middle cerebral arteries (MCA): Normal. VENOUS SINUSES: As permitted by contrast timing, patent. ANATOMIC VARIANTS: None Review of the MIP images confirms the above findings. IMPRESSION: 1. No emergent large vessel occlusion or hemodynamically significant stenosis of the head or neck. 2. Mild left carotid bifurcation atherosclerotic calcification without hemodynamically significant stenosis. Electronically Signed   By: Ulyses Jarred M.D.   On: 06/05/2019 01:44   MR BRAIN WO CONTRAST  Addendum Date: 06/04/2019   ADDENDUM REPORT: 06/04/2019 23:41 ADDENDUM: I discussed the case with Dr. Rory Percy at 11:30 p.m. on 06/04/2019. There are actually a few punctate foci of hyperintensity on diffusion-weighted imaging within the subcortical white matter of both frontal lobes (series 3 images 36, 39 and 42). There is no clear correlate on the ADC map. The appearance is most compatible with recent, but subacute, punctate infarcts. Evaluation for possible embolic sources may be  appropriate. Electronically Signed   By: Ulyses Jarred M.D.   On: 06/04/2019 23:41   Result Date: 06/04/2019 CLINICAL DATA:  Slurred speech and perioral numbness EXAM: MRI HEAD WITHOUT CONTRAST TECHNIQUE: Multiplanar, multiecho pulse sequences of the brain and surrounding structures were obtained without intravenous contrast. COMPARISON:  Brain MRI 06/25/2016 FINDINGS: Brain: No acute infarct, acute hemorrhage or extra-axial collection. Multifocal white matter hyperintensity, most commonly due to chronic ischemic microangiopathy. There is generalized atrophy without lobar predilection. No chronic microhemorrhage. Normal midline structures. Vascular: Normal flow voids. Skull and upper cervical spine: Normal marrow signal. Sinuses/Orbits: Negative. Other: None. IMPRESSION: 1. No acute intracranial abnormality. 2. Generalized atrophy and findings of chronic ischemic microangiopathy. Electronically Signed: By: Ulyses Jarred M.D. On: 06/04/2019 23:15   ECHOCARDIOGRAM COMPLETE  Result Date: 06/05/2019    ECHOCARDIOGRAM REPORT   Patient Name:   Jasin Boggan Date of Exam: 06/05/2019 Medical Rec #:  [id number]     Height:       75.0 in Accession #:    [id number]    Weight:       267.0 lb Date of Birth:  [birth year]     BSA:          2.48 m Patient Age:    89 years      BP:           149/100 mmHg Patient Gender: M             HR:           86 bpm. Exam Location:  Inpatient Procedure: 2D Echo, Cardiac Doppler and Color Doppler Indications:    Stroke  History:        Patient has prior history of Echocardiogram examinations, most                 recent 06/26/2016. Abnormal ECG, Stroke, Signs/Symptoms:Dyspnea                 and Dizziness/Lightheadedness; Risk Factors:Diabetes,                 Hypertension, Sleep Apnea, Current Smoker and Dyslipidemia.  Sonographer:  Roseanna Rainbow RDCS Referring Phys: [id number] [place]  1. Left ventricular ejection fraction, by estimation, is 65 to 70%. The left ventricle has  normal function. The left ventrical has no regional wall motion abnormalities. There is moderately increased left ventricular hypertrophy of the basal-septal segment. Left ventricular diastolic parameters were normal.  2. Right ventricular systolic function is normal. The right ventricular size is normal. Tricuspid regurgitation signal is inadequate for assessing PA pressure.  3. The mitral valve is normal in structure and function. no evidence of mitral valve regurgitation. No evidence of mitral stenosis.  4. The aortic valve is normal in structure and function. Aortic valve regurgitation is not visualized. No aortic stenosis is present.  5. Aortic dilatation noted. 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  6. The inferior vena cava is normal in size with <50% respiratory variability, suggesting right atrial pressure of 8 mmHg. FINDINGS  Left Ventricle: Left ventricular ejection fraction, by estimation, is 65 to 70%. The left ventricle has normal function. The left ventricle has no regional wall motion abnormalities. The left ventricular internal cavity size was normal in size. There is  moderately increased. There is moderately increased left ventricular hypertrophy of the basal-septal segment. Left ventricular diastolic parameters were normal. Right Ventricle: The right ventricular size is normal. No increase in right ventricular wall thickness. Right ventricular systolic function is normal. Tricuspid regurgitation signal is inadequate for assessing PA pressure. Left Atrium: Left atrial size was normal in size. Right Atrium: Right atrial size was normal in size. Pericardium: There is no evidence of pericardial effusion. Mitral Valve: The mitral valve is normal in structure and function. Normal mobility of the mitral valve leaflets. No evidence of mitral valve regurgitation. No evidence of mitral valve stenosis. Tricuspid Valve: The tricuspid valve is normal in structure. Tricuspid valve  regurgitation is not demonstrated. No evidence of tricuspid stenosis. Aortic Valve: The aortic valve is normal in structure and function. Aortic valve regurgitation is not visualized. No aortic stenosis is present. Pulmonic Valve: The pulmonic valve was normal in structure. Pulmonic valve regurgitation is not visualized. No evidence of pulmonic stenosis. Aorta: The aortic root is normal in size and structure and aortic dilatation noted. 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 Venous: The inferior vena cava is normal in size with less than 50% respiratory variability, suggesting right atrial pressure of 8 mmHg. The inferior vena cava and the hepatic vein show a normal flow pattern. IAS/Shunts: The interatrial septum appears to be lipomatous. No atrial level shunt detected by color flow Doppler.  LEFT VENTRICLE PLAX 2D LVIDd:         3.60 cm       Diastology LVIDs:         2.68 cm       LV e' lateral:   10.30 cm/s LV PW:         1.41 cm       LV E/e' lateral: 6.8 LV IVS:        1.43 cm       LV e' medial:    6.31 cm/s LVOT diam:     2.10 cm       LV E/e' medial:  11.1 LV SV:         55.07 ml LV SV Index:   10.93 LVOT Area:     3.46 cm  LV Volumes (MOD) LV area d, A2C:    22.00 cm LV area d,  A4C:    22.50 cm LV area s, A2C:    9.95 cm LV area s, A4C:    11.50 cm LV major d, A2C:   7.59 cm LV major d, A4C:   6.67 cm LV major s, A2C:   5.96 cm LV major s, A4C:   5.21 cm LV vol d, MOD A2C: 54.1 ml LV vol d, MOD A4C: 62.1 ml LV vol s, MOD A2C: 14.2 ml LV vol s, MOD A4C: 21.7 ml LV SV MOD A2C:     39.9 ml LV SV MOD A4C:     62.1 ml LV SV MOD BP:      42.9 ml RIGHT VENTRICLE            IVC RV S prime:     9.25 cm/s  IVC diam: 1.81 cm TAPSE (M-mode): 1.9 cm LEFT ATRIUM           Index       RIGHT ATRIUM           Index LA diam:      2.80 cm 1.13 cm/m  RA Area:     12.10 cm LA Vol (A2C): 65.9 ml 26.56 ml/m RA Volume:   26.50 ml  10.68 ml/m LA Vol (A4C): 26.7 ml 10.76 ml/m  AORTIC VALVE LVOT  Vmax:   93.10 cm/s LVOT Vmean:  56.100 cm/s LVOT VTI:    0.159 m  AORTA Ao Root diam: 3.70 cm Ao Asc diam:  4.10 cm MITRAL VALVE MV Area (PHT): 4.31 cm             SHUNTS MV Decel Time: 176 msec             Systemic VTI:  0.16 m MV E velocity: 69.80 cm/s 103 cm/s  Systemic Diam: 2.10 cm MV A velocity: 80.10 cm/s 70.3 cm/s MV E/A ratio:  0.87       1.5 Fransico Him MD Electronically signed by Fransico Him MD Signature Date/Time: 06/05/2019/10:52:29 AM    Final    VAS [place] LOWER EXTREMITY VENOUS (DVT)  Result Date: 06/06/2019  Lower Venous DVTStudy Indications: Stroke.  Comparison Study: 08/28/2016- negative lower extremity venous duplex Performing Technologist: Maudry Mayhew MHA, RDMS, RVT, RDCS  Examination Guidelines: A complete evaluation includes B-mode imaging, spectral Doppler, color Doppler, and power Doppler as needed of all accessible portions of each vessel. Bilateral testing is considered an integral part of a complete examination. Limited examinations for reoccurring indications may be performed as noted. The reflux portion of the exam is performed with the patient in reverse Trendelenburg.  +---------+---------------+---------+-----------+----------+--------------+ RIGHT    CompressibilityPhasicitySpontaneityPropertiesThrombus Aging +---------+---------------+---------+-----------+----------+--------------+ CFV      Full           Yes      Yes                                 +---------+---------------+---------+-----------+----------+--------------+ SFJ      Full                                                        +---------+---------------+---------+-----------+----------+--------------+ FV Prox  Full                                                        +---------+---------------+---------+-----------+----------+--------------+  FV Mid   Full                                                         +---------+---------------+---------+-----------+----------+--------------+ FV DistalFull                                                        +---------+---------------+---------+-----------+----------+--------------+ PFV      Full                                                        +---------+---------------+---------+-----------+----------+--------------+ POP      Full           Yes      Yes                                 +---------+---------------+---------+-----------+----------+--------------+ PTV      Full                                                        +---------+---------------+---------+-----------+----------+--------------+ PERO     Full                                                        +---------+---------------+---------+-----------+----------+--------------+   +---------+---------------+---------+-----------+----------+--------------+ LEFT     CompressibilityPhasicitySpontaneityPropertiesThrombus Aging +---------+---------------+---------+-----------+----------+--------------+ CFV      Full           Yes      Yes                                 +---------+---------------+---------+-----------+----------+--------------+ SFJ      Full                                                        +---------+---------------+---------+-----------+----------+--------------+ FV Prox  Full                                                        +---------+---------------+---------+-----------+----------+--------------+ FV Mid   Full                                                        +---------+---------------+---------+-----------+----------+--------------+  FV DistalFull                                                        +---------+---------------+---------+-----------+----------+--------------+ PFV      Full                                                         +---------+---------------+---------+-----------+----------+--------------+ POP      Full           Yes      Yes                                 +---------+---------------+---------+-----------+----------+--------------+ PTV      Full                                                        +---------+---------------+---------+-----------+----------+--------------+ PERO     Full                                                        +---------+---------------+---------+-----------+----------+--------------+     Summary: RIGHT: - There is no evidence of deep vein thrombosis in the lower extremity.  - No cystic structure found in the popliteal fossa.  LEFT: - There is no evidence of deep vein thrombosis in the lower extremity.  - No cystic structure found in the popliteal fossa.  *See table(s) above for measurements and observations. Electronically signed by Harold Barban MD on 06/06/2019 at 7:28:59 AM.    Final     12-lead ECG sinus rhythm (personally reviewed) All prior EKG's in EPIC reviewed with no documented atrial fibrillation  Telemetry SR/ST (personally reviewed)  Assessment and Plan:  1. Cryptogenic stroke The patient presents with cryptogenic stroke.  I spoke at length with the patient about monitoring for afib with an implantable loop recorder.  Risks, benefits, and alteratives to implantable loop recorder were discussed with the patient today.   At this time, the patient is very clear in their decision to proceed with implantable loop recorder.   Wound care was reviewed with the patient (keep incision clean and dry for 3 days).  Wound check scheduled and entered in AVS.  Please call with questions.   Chanetta Marshall, NP 06/06/2019 8:52 AM  I have seen, examined the patient, and reviewed the above assessment and plan.  Changes to above are made where necessary.  On exam, RRR.    The patient presents with cryptogenic stroke.  I spoke at length with the patient about  monitoring for afib with an implantable loop recorder.  Risks, benefits, and alteratives to implantable loop recorder were discussed with the patient today.   At this time, the patient is very clear in their decision to proceed with implantable loop recorder.   Please  call with questions.   Co Sign: [name], MD 06/06/2019 10:54 AM

## 2019-06-06 NOTE — Discharge Summary (Signed)
Physician Discharge Summary  Rodney Crane XFG:[id number] DOB: July 08, 1950 DOA: 06/04/2019  PCP: Marton Redwood, MD  Admit date: 06/04/2019 Discharge date: 06/06/2019  Admitted From: home Discharge disposition: home   Recommendations for Outpatient Follow-Up:   1. Follow up Dr Star Age at [facility] neurology 2. Follow up with cardiology as scheduled for evaluation of loop recorder data.  3. Wound check as scheduled 4. BMP 1 week   Discharge Diagnosis:   Principal Problem:   CVA (cerebrovascular accident) ([place]) Active Problems:   DM (diabetes mellitus), type 2 with renal complications ([place])   Essential hypertension   Spinal stenosis in cervical region   OSA on CPAP   Tobacco abuse   CVA (cerebral vascular accident) [facility])    Discharge Condition: Improved.  Diet recommendation: Low sodium, heart healthy.  Carbohydrate-modified  Wound care: keep incision clean and dry for 3 days. Wound check as scheduled  Code status: Full.   History of Present Illness:   Rodney Crane is a 69 y.o. male with medical history significant of HLD, OSA on CPAP, HTN  smoking, hyperlipidemia, type 2 diabetes, degenerative back disease, s/p 7 back surgeries, and stroke (left thalamic on MRI), history of bilateral DVT history of foot drop and Bell's palsy.  Presented 06/04/19 with slurred speech and numbness in the mouth that started at 9 AM.  He stated that he knew what he wanted to say but just could not get the words out Symptoms been coming and going throughout the day.He was trying to talk to his brother on the phone  He could not talk for about 5 minutes.  Reported residual foot drop weakness in the right leg but that morning both legs felt somewhat heavy.  No double vision.  No vertigo  Patient with known history of CVA and takes Plavix not on Asapirin Smokes cigars on occasion Walks with cane   Hospital Course by Problem:   #1.  Acute CVA.  MRI with subacute frontal lobe MCA  infarcts, embolic secondary to unknown source.  Echo with an EF of 65 to 70%, no source of embolus, LDL 56, lower extremity Doppler with no DVT.    CTA of the head and neck no E LVO.  Mild left ICA bifurcation.  Evaluated by PT and OT who recommend no further therapies needed.  Evaluated by neurology who recommended loop recorder to evaluate for A. fib as a source of stroke.  Neurology also recommends Plavix and Bayer trial study meds and patient agrees.  Follow-up with neurology as noted above.  #2.  Diabetes type 2.    Hemoglobin A1c 7.4.  Goal is less than 7.0. Serum glucose 126 on admission.  Home medications include Metformin.  Resume home regimen recommend improved control.  #3.  Spinal stenosis.  Appears stable at baseline.  #4.  Hypertension.  Fair control. Blood pressure on the high end of normal.  Home medications include hydrochlorothiazide/valsartan. Resume at discharge.  #5.  Hyperlipidemia.  Home meds include Lipitor.  Resume at discharge.  #6.  Tobacco use.  Cessation counseling offered    Medical Consultants:   [facility]   Discharge Exam:   Vitals:   06/06/19 1223 06/06/19 1618  BP: 131/79 (!) 130/92  Pulse: 77 74  Resp: 18 18  Temp: 98.3 F (36.8 C) 98.1 F (36.7 C)  SpO2: 98% 100%   Vitals:   06/06/19 0414 06/06/19 0800 06/06/19 1223 06/06/19 1618  BP: 121/71 98/78 131/79 (!) 130/92  Pulse: 74  80 77 74  Resp: '18 17 18 18  '$ Temp: 98.1 F (36.7 C) 98 F (36.7 C) 98.3 F (36.8 C) 98.1 F (36.7 C)  TempSrc: Oral Oral Oral Oral  SpO2: 100% 100% 98% 100%    General exam: Appears calm and comfortable.     The results of significant diagnostics from this hospitalization (including imaging, microbiology, ancillary and laboratory) are listed below for reference.     Procedures and Diagnostic Studies:   CT Angio Head W or Wo Contrast  Result Date: 06/05/2019 CLINICAL DATA:  Stroke follow-up EXAM: CT ANGIOGRAPHY HEAD AND NECK TECHNIQUE: Multidetector  CT imaging of the head and neck was performed using the standard protocol during bolus administration of intravenous contrast. Multiplanar CT image reconstructions and MIPs were obtained to evaluate the vascular anatomy. Carotid stenosis measurements (when applicable) are obtained utilizing NASCET criteria, using the distal internal carotid diameter as the denominator. CONTRAST:  49m OMNIPAQUE IOHEXOL 350 MG/ML SOLN COMPARISON:  Brain MRI 06/04/2019 FINDINGS: CTA NECK FINDINGS SKELETON: C4-7 anterior spinal fusion and C2-6 posterior instrumented fusion. OTHER NECK: Normal pharynx, larynx and major salivary glands. No cervical lymphadenopathy. Unremarkable thyroid gland. UPPER CHEST: No pneumothorax or pleural effusion. No nodules or masses. AORTIC ARCH: There is no calcific atherosclerosis of the aortic arch. There is no aneurysm, dissection or hemodynamically significant stenosis of the visualized portion of the aorta. Conventional 3 vessel aortic branching pattern. The visualized proximal subclavian arteries are widely patent. RIGHT CAROTID SYSTEM: Normal without aneurysm, dissection or stenosis. LEFT CAROTID SYSTEM: No dissection, occlusion or aneurysm. Mild atherosclerotic calcification at the carotid bifurcation without hemodynamically significant stenosis. VERTEBRAL ARTERIES: Left dominant configuration. Both origins are clearly patent. There is no dissection, occlusion or flow-limiting stenosis to the skull base (V1-V3 segments). CTA HEAD FINDINGS POSTERIOR CIRCULATION: --Vertebral arteries: Minimal atherosclerotic calcification in the left V4 segment. --Posterior inferior cerebellar arteries (PICA): Patent origins from the vertebral arteries. --Anterior inferior cerebellar arteries (AICA): Patent origins from the basilar artery. --Basilar artery: Normal. --Superior cerebellar arteries: Normal. --Posterior cerebral arteries: Normal. Both originate from the basilar artery. Posterior communicating arteries  (p-comm) are diminutive or absent. ANTERIOR CIRCULATION: --Intracranial internal carotid arteries: Normal. --Anterior cerebral arteries (ACA): Normal. Both A1 segments are present. Patent anterior communicating artery (a-comm). --Middle cerebral arteries (MCA): Normal. VENOUS SINUSES: As permitted by contrast timing, patent. ANATOMIC VARIANTS: None Review of the MIP images confirms the above findings. IMPRESSION: 1. No emergent large vessel occlusion or hemodynamically significant stenosis of the head or neck. 2. Mild left carotid bifurcation atherosclerotic calcification without hemodynamically significant stenosis. Electronically Signed   By: KUlyses JarredM.D.   On: 06/05/2019 01:44   DG Chest 2 View  Result Date: 06/05/2019 CLINICAL DATA:  Stroke EXAM: CHEST - 2 VIEW COMPARISON:  06/29/2016 FINDINGS: Normal heart size and stable mediastinal contours. Chronic interstitial coarsening at the bases with interstitial opacity seen since 2012 chest CT. There is no edema, consolidation, effusion, or pneumothorax. Cervical fusion hardware. Thoracic spondylosis with bridging osteophytes. IMPRESSION: Chronic lung disease without acute superimposed finding. Electronically Signed   By: JMonte FantasiaM.D.   On: 06/05/2019 07:02   CT HEAD WO CONTRAST  Result Date: 06/04/2019 CLINICAL DATA:  Slurred speech, memory loss, lack of concentration, symptoms since 9 a.m. today EXAM: CT HEAD WITHOUT CONTRAST TECHNIQUE: Contiguous axial images were obtained from the base of the skull through the vertex without intravenous contrast. COMPARISON:  06/25/2016 FINDINGS: Brain: Dilated perivascular space versus chronic lacunar infarct left thalamus unchanged. No  acute infarct or hemorrhage. Lateral ventricles and remaining midline structures are unremarkable. No acute extra-axial fluid collections. No mass effect. Vascular: No hyperdense vessel or unexpected calcification. Skull: Normal. Negative for fracture or focal lesion.  Sinuses/Orbits: No acute finding. Other: None IMPRESSION: 1. No acute intracranial process.  The Electronically Signed   By: Randa Ngo M.D.   On: 06/04/2019 18:23   CT Angio Neck W and/or Wo Contrast  Result Date: 06/05/2019 CLINICAL DATA:  Stroke follow-up EXAM: CT ANGIOGRAPHY HEAD AND NECK TECHNIQUE: Multidetector CT imaging of the head and neck was performed using the standard protocol during bolus administration of intravenous contrast. Multiplanar CT image reconstructions and MIPs were obtained to evaluate the vascular anatomy. Carotid stenosis measurements (when applicable) are obtained utilizing NASCET criteria, using the distal internal carotid diameter as the denominator. CONTRAST:  78m OMNIPAQUE IOHEXOL 350 MG/ML SOLN COMPARISON:  Brain MRI 06/04/2019 FINDINGS: CTA NECK FINDINGS SKELETON: C4-7 anterior spinal fusion and C2-6 posterior instrumented fusion. OTHER NECK: Normal pharynx, larynx and major salivary glands. No cervical lymphadenopathy. Unremarkable thyroid gland. UPPER CHEST: No pneumothorax or pleural effusion. No nodules or masses. AORTIC ARCH: There is no calcific atherosclerosis of the aortic arch. There is no aneurysm, dissection or hemodynamically significant stenosis of the visualized portion of the aorta. Conventional 3 vessel aortic branching pattern. The visualized proximal subclavian arteries are widely patent. RIGHT CAROTID SYSTEM: Normal without aneurysm, dissection or stenosis. LEFT CAROTID SYSTEM: No dissection, occlusion or aneurysm. Mild atherosclerotic calcification at the carotid bifurcation without hemodynamically significant stenosis. VERTEBRAL ARTERIES: Left dominant configuration. Both origins are clearly patent. There is no dissection, occlusion or flow-limiting stenosis to the skull base (V1-V3 segments). CTA HEAD FINDINGS POSTERIOR CIRCULATION: --Vertebral arteries: Minimal atherosclerotic calcification in the left V4 segment. --Posterior inferior cerebellar  arteries (PICA): Patent origins from the vertebral arteries. --Anterior inferior cerebellar arteries (AICA): Patent origins from the basilar artery. --Basilar artery: Normal. --Superior cerebellar arteries: Normal. --Posterior cerebral arteries: Normal. Both originate from the basilar artery. Posterior communicating arteries (p-comm) are diminutive or absent. ANTERIOR CIRCULATION: --Intracranial internal carotid arteries: Normal. --Anterior cerebral arteries (ACA): Normal. Both A1 segments are present. Patent anterior communicating artery (a-comm). --Middle cerebral arteries (MCA): Normal. VENOUS SINUSES: As permitted by contrast timing, patent. ANATOMIC VARIANTS: None Review of the MIP images confirms the above findings. IMPRESSION: 1. No emergent large vessel occlusion or hemodynamically significant stenosis of the head or neck. 2. Mild left carotid bifurcation atherosclerotic calcification without hemodynamically significant stenosis. Electronically Signed   By: KUlyses JarredM.D.   On: 06/05/2019 01:44   MR BRAIN WO CONTRAST  Addendum Date: 06/04/2019   ADDENDUM REPORT: 06/04/2019 23:41 ADDENDUM: I discussed the case with Dr. ARory Percyat 11:30 p.m. on 06/04/2019. There are actually a few punctate foci of hyperintensity on diffusion-weighted imaging within the subcortical white matter of both frontal lobes (series 3 images 36, 39 and 42). There is no clear correlate on the ADC map. The appearance is most compatible with recent, but subacute, punctate infarcts. Evaluation for possible embolic sources may be appropriate. Electronically Signed   By: KUlyses JarredM.D.   On: 06/04/2019 23:41   Result Date: 06/04/2019 CLINICAL DATA:  Slurred speech and perioral numbness EXAM: MRI HEAD WITHOUT CONTRAST TECHNIQUE: Multiplanar, multiecho pulse sequences of the brain and surrounding structures were obtained without intravenous contrast. COMPARISON:  Brain MRI 06/25/2016 FINDINGS: Brain: No acute infarct, acute hemorrhage  or extra-axial collection. Multifocal white matter hyperintensity, most commonly due to chronic ischemic microangiopathy. There is  generalized atrophy without lobar predilection. No chronic microhemorrhage. Normal midline structures. Vascular: Normal flow voids. Skull and upper cervical spine: Normal marrow signal. Sinuses/Orbits: Negative. Other: None. IMPRESSION: 1. No acute intracranial abnormality. 2. Generalized atrophy and findings of chronic ischemic microangiopathy. Electronically Signed: By: Ulyses Jarred M.D. On: 06/04/2019 23:15   ECHOCARDIOGRAM COMPLETE  Result Date: 06/05/2019    ECHOCARDIOGRAM REPORT   Patient Name:   Rodney Crane Date of Exam: 06/05/2019 Medical Rec #:  [id number]     Height:       75.0 in Accession #:    [id number]    Weight:       267.0 lb Date of Birth:  [birth year]     BSA:          2.48 m Patient Age:    70 years      BP:           149/100 mmHg Patient Gender: M             HR:           86 bpm. Exam Location:  Inpatient Procedure: 2D Echo, Cardiac Doppler and Color Doppler Indications:    Stroke  History:        Patient has prior history of Echocardiogram examinations, most                 recent 06/26/2016. Abnormal ECG, Stroke, Signs/Symptoms:Dyspnea                 and Dizziness/Lightheadedness; Risk Factors:Diabetes,                 Hypertension, Sleep Apnea, Current Smoker and Dyslipidemia.  Sonographer:    Roseanna Rainbow RDCS Referring Phys: [place]  1. Left ventricular ejection fraction, by estimation, is 65 to 70%. The left ventricle has normal function. The left ventrical has no regional wall motion abnormalities. There is moderately increased left ventricular hypertrophy of the basal-septal segment. Left ventricular diastolic parameters were normal.  2. Right ventricular systolic function is normal. The right ventricular size is normal. Tricuspid regurgitation signal is inadequate for assessing PA pressure.  3. The mitral valve is normal in  structure and function. no evidence of mitral valve regurgitation. No evidence of mitral stenosis.  4. The aortic valve is normal in structure and function. Aortic valve regurgitation is not visualized. No aortic stenosis is present.  5. Aortic dilatation noted. 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  6. The inferior vena cava is normal in size with <50% respiratory variability, suggesting right atrial pressure of 8 mmHg. FINDINGS  Left Ventricle: Left ventricular ejection fraction, by estimation, is 65 to 70%. The left ventricle has normal function. The left ventricle has no regional wall motion abnormalities. The left ventricular internal cavity size was normal in size. There is  moderately increased. There is moderately increased left ventricular hypertrophy of the basal-septal segment. Left ventricular diastolic parameters were normal. Right Ventricle: The right ventricular size is normal. No increase in right ventricular wall thickness. Right ventricular systolic function is normal. Tricuspid regurgitation signal is inadequate for assessing PA pressure. Left Atrium: Left atrial size was normal in size. Right Atrium: Right atrial size was normal in size. Pericardium: There is no evidence of pericardial effusion. Mitral Valve: The mitral valve is normal in structure and function. Normal mobility of the mitral valve leaflets. No evidence of mitral valve regurgitation. No evidence of mitral valve stenosis. Tricuspid Valve: The tricuspid  valve is normal in structure. Tricuspid valve regurgitation is not demonstrated. No evidence of tricuspid stenosis. Aortic Valve: The aortic valve is normal in structure and function. Aortic valve regurgitation is not visualized. No aortic stenosis is present. Pulmonic Valve: The pulmonic valve was normal in structure. Pulmonic valve regurgitation is not visualized. No evidence of pulmonic stenosis. Aorta: The aortic root is normal in size and structure and  aortic dilatation noted. 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 Venous: The inferior vena cava is normal in size with less than 50% respiratory variability, suggesting right atrial pressure of 8 mmHg. The inferior vena cava and the hepatic vein show a normal flow pattern. IAS/Shunts: The interatrial septum appears to be lipomatous. No atrial level shunt detected by color flow Doppler.  LEFT VENTRICLE PLAX 2D LVIDd:         3.60 cm       Diastology LVIDs:         2.68 cm       LV e' lateral:   10.30 cm/s LV PW:         1.41 cm       LV E/e' lateral: 6.8 LV IVS:        1.43 cm       LV e' medial:    6.31 cm/s LVOT diam:     2.10 cm       LV E/e' medial:  11.1 LV SV:         55.07 ml LV SV Index:   10.93 LVOT Area:     3.46 cm  LV Volumes (MOD) LV area d, A2C:    22.00 cm LV area d, A4C:    22.50 cm LV area s, A2C:    9.95 cm LV area s, A4C:    11.50 cm LV major d, A2C:   7.59 cm LV major d, A4C:   6.67 cm LV major s, A2C:   5.96 cm LV major s, A4C:   5.21 cm LV vol d, MOD A2C: 54.1 ml LV vol d, MOD A4C: 62.1 ml LV vol s, MOD A2C: 14.2 ml LV vol s, MOD A4C: 21.7 ml LV SV MOD A2C:     39.9 ml LV SV MOD A4C:     62.1 ml LV SV MOD BP:      42.9 ml RIGHT VENTRICLE            IVC RV S prime:     9.25 cm/s  IVC diam: 1.81 cm TAPSE (M-mode): 1.9 cm LEFT ATRIUM           Index       RIGHT ATRIUM           Index LA diam:      2.80 cm 1.13 cm/m  RA Area:     12.10 cm LA Vol (A2C): 65.9 ml 26.56 ml/m RA Volume:   26.50 ml  10.68 ml/m LA Vol (A4C): 26.7 ml 10.76 ml/m  AORTIC VALVE LVOT Vmax:   93.10 cm/s LVOT Vmean:  56.100 cm/s LVOT VTI:    0.159 m  AORTA Ao Root diam: 3.70 cm Ao Asc diam:  4.10 cm MITRAL VALVE MV Area (PHT): 4.31 cm             SHUNTS MV Decel Time: 176 msec             Systemic VTI:  0.16 m MV E velocity: 69.80 cm/s 103 cm/s  Systemic Diam: 2.10  cm MV A velocity: 80.10 cm/s 70.3 cm/s MV E/A ratio:  0.87       1.5 Fransico Him MD Electronically signed by Fransico Him MD  Signature Date/Time: 06/05/2019/10:52:29 AM    Final    VAS [place] LOWER EXTREMITY VENOUS (DVT)  Result Date: 06/06/2019  Lower Venous DVTStudy Indications: Stroke.  Comparison Study: 08/28/2016- negative lower extremity venous duplex Performing Technologist: Maudry Mayhew MHA, RDMS, RVT, RDCS  Examination Guidelines: A complete evaluation includes B-mode imaging, spectral Doppler, color Doppler, and power Doppler as needed of all accessible portions of each vessel. Bilateral testing is considered an integral part of a complete examination. Limited examinations for reoccurring indications may be performed as noted. The reflux portion of the exam is performed with the patient in reverse Trendelenburg.  +---------+---------------+---------+-----------+----------+--------------+ RIGHT    CompressibilityPhasicitySpontaneityPropertiesThrombus Aging +---------+---------------+---------+-----------+----------+--------------+ CFV      Full           Yes      Yes                                 +---------+---------------+---------+-----------+----------+--------------+ SFJ      Full                                                        +---------+---------------+---------+-----------+----------+--------------+ FV Prox  Full                                                        +---------+---------------+---------+-----------+----------+--------------+ FV Mid   Full                                                        +---------+---------------+---------+-----------+----------+--------------+ FV DistalFull                                                        +---------+---------------+---------+-----------+----------+--------------+ PFV      Full                                                        +---------+---------------+---------+-----------+----------+--------------+ POP      Full           Yes      Yes                                  +---------+---------------+---------+-----------+----------+--------------+ PTV      Full                                                        +---------+---------------+---------+-----------+----------+--------------+  PERO     Full                                                        +---------+---------------+---------+-----------+----------+--------------+   +---------+---------------+---------+-----------+----------+--------------+ LEFT     CompressibilityPhasicitySpontaneityPropertiesThrombus Aging +---------+---------------+---------+-----------+----------+--------------+ CFV      Full           Yes      Yes                                 +---------+---------------+---------+-----------+----------+--------------+ SFJ      Full                                                        +---------+---------------+---------+-----------+----------+--------------+ FV Prox  Full                                                        +---------+---------------+---------+-----------+----------+--------------+ FV Mid   Full                                                        +---------+---------------+---------+-----------+----------+--------------+ FV DistalFull                                                        +---------+---------------+---------+-----------+----------+--------------+ PFV      Full                                                        +---------+---------------+---------+-----------+----------+--------------+ POP      Full           Yes      Yes                                 +---------+---------------+---------+-----------+----------+--------------+ PTV      Full                                                        +---------+---------------+---------+-----------+----------+--------------+ PERO     Full                                                         +---------+---------------+---------+-----------+----------+--------------+  Summary: RIGHT: - There is no evidence of deep vein thrombosis in the lower extremity.  - No cystic structure found in the popliteal fossa.  LEFT: - There is no evidence of deep vein thrombosis in the lower extremity.  - No cystic structure found in the popliteal fossa.  *See table(s) above for measurements and observations. Electronically signed by Harold Barban MD on 06/06/2019 at 7:28:59 AM.    Final      Labs:   Basic Metabolic Panel: Recent Labs  Lab 06/04/19 1704 06/04/19 1725  NA 138 136  K 4.4 5.5*  CL 101 102  CO2 24  --   GLUCOSE 126* 126*  BUN 18 27*  CREATININE 1.28* 1.30*  CALCIUM 9.7  --    GFR CrCl cannot be calculated (Unknown ideal weight.). Liver Function Tests: Recent Labs  Lab 06/04/19 1704  AST 42*  ALT 33  ALKPHOS 318*  BILITOT 0.7  PROT 8.3*  ALBUMIN 3.8   No results for input(s): LIPASE, AMYLASE in the last 168 hours. No results for input(s): AMMONIA in the last 168 hours. Coagulation profile Recent Labs  Lab 06/04/19 1704  INR 1.0    CBC: Recent Labs  Lab 06/04/19 1704 06/04/19 1725  WBC 6.2  --   NEUTROABS 3.1  --   HGB 12.2* 12.6*  HCT 37.6* 37.0*  MCV 90.8  --   PLT 350  --    Cardiac Enzymes: No results for input(s): CKTOTAL, CKMB, CKMBINDEX, TROPONINI in the last 168 hours. BNP: Invalid input(s): POCBNP CBG: Recent Labs  Lab 06/05/19 1150 06/05/19 1613 06/05/19 2109 06/06/19 0609 06/06/19 1128  GLUCAP 163* 202* 153* 135* 166*   D-Dimer No results for input(s): DDIMER in the last 72 hours. Hgb A1c Recent Labs    06/05/19 0325  HGBA1C 7.4*   Lipid Profile Recent Labs    06/05/19 0325  CHOL 112  HDL 38*  LDLCALC 56  TRIG 92  CHOLHDL 2.9   Thyroid function studies No results for input(s): TSH, T4TOTAL, T3FREE, THYROIDAB in the last 72 hours.  Invalid input(s): FREET3 Anemia work up No results for input(s): VITAMINB12,  FOLATE, FERRITIN, TIBC, IRON, RETICCTPCT in the last 72 hours. Microbiology Recent Results (from the past 240 hour(s))  Respiratory Panel by RT PCR (Flu A&B, Covid) - Nasopharyngeal Swab     Status: None   Collection Time: 06/05/19 12:37 AM   Specimen: Nasopharyngeal Swab  Result Value Ref Range Status   SARS Coronavirus 2 by RT PCR NEGATIVE NEGATIVE Final    Comment: (NOTE) SARS-CoV-2 target nucleic acids are NOT DETECTED. The SARS-CoV-2 RNA is generally detectable in upper respiratoy specimens during the acute phase of infection. The lowest concentration of SARS-CoV-2 viral copies this assay can detect is 131 copies/mL. A negative result does not preclude SARS-Cov-2 infection and should not be used as the sole basis for treatment or other patient management decisions. A negative result may occur with  improper specimen collection/handling, submission of specimen other than nasopharyngeal swab, presence of viral mutation(s) within the areas targeted by this assay, and inadequate number of viral copies (<131 copies/mL). A negative result must be combined with clinical observations, patient history, and epidemiological information. The expected result is Negative. Fact Sheet for Patients:  PinkCheek.be Fact Sheet for Healthcare Providers:  GravelBags.it This test is not yet ap proved or cleared by the [place] FDA and  has been authorized for detection and/or diagnosis of SARS-CoV-2 by FDA under an Emergency Use Authorization (EUA).  This EUA will remain  in effect (meaning this test can be used) for the duration of the COVID-19 declaration under Section 564(b)(1) of the Act, 21 U.S.C. section 360bbb-3(b)(1), unless the authorization is terminated or revoked sooner.    Influenza A by PCR NEGATIVE NEGATIVE Final   Influenza B by PCR NEGATIVE NEGATIVE Final    Comment: (NOTE) The Xpert Xpress SARS-CoV-2/FLU/RSV assay is  intended as an aid in  the diagnosis of influenza from Nasopharyngeal swab specimens and  should not be used as a sole basis for treatment. Nasal washings and  aspirates are unacceptable for Xpert Xpress SARS-CoV-2/FLU/RSV  testing. Fact Sheet for Patients: PinkCheek.be Fact Sheet for Healthcare Providers: GravelBags.it This test is not yet approved or cleared by the [place] FDA and  has been authorized for detection and/or diagnosis of SARS-CoV-2 by  FDA under an Emergency Use Authorization (EUA). This EUA will remain  in effect (meaning this test can be used) for the duration of the  Covid-19 declaration under Section 564(b)(1) of the Act, 21  U.S.C. section 360bbb-3(b)(1), unless the authorization is  terminated or revoked. Performed at [facility], [place] [address], [place], [place] [address]      Discharge Instructions:   Discharge Instructions    Ambulatory referral to Neurology   Complete by: As directed    An appointment is requested in approximately: 4 weeks   Diet - low sodium heart healthy   Complete by: As directed    Diet Carb Modified   Complete by: As directed    Increase activity slowly   Complete by: As directed      Allergies as of 06/06/2019   No Known Allergies     Medication List    STOP taking these medications   omeprazole 20 MG capsule Commonly known as: PRILOSEC Replaced by: pantoprazole 40 MG tablet   sildenafil 100 MG tablet Commonly known as: VIAGRA     TAKE these medications   Accu-Chek Guide test strip Generic drug: glucose blood USE TO TEST BLOOD SUGAR DAILY   atorvastatin 40 MG tablet Commonly known as: LIPITOR Take 0.5 tablets (20 mg total) by mouth at bedtime. What changed: how much to take   BAY [id number] or placebo (Blue Bottle) Tabs tablet Take 1 tablet by mouth daily. For Investigational Use Only. Take with 1 tablet from Green Bottle for a total of 10,  20, 50 mg or placebo daily. Swallow whole with a glass of water; do not crush or chew. PACIFIC (STROKE) STUDY MEDICATION - BAY [id number] (Blue Bottle) 5, 15, 25 mg or placebo tablet (PI-Sethi) Start taking on: June 07, 2019   blood glucose meter kit and supplies Kit Dispense based on patient and insurance preference. Use up to four times daily as directed. (FOR ICD-9 250.00, 250.01).   clopidogrel 75 MG tablet Commonly known as: PLAVIX Take 75 mg by mouth daily.   cyclobenzaprine 10 MG tablet Commonly known as: FLEXERIL Take 1 tablet (10 mg total) by mouth every 8 (eight) hours as needed for muscle spasms.   Investigational - Study Medication Study name: [place] stroke Drug: BAY [phone] (Green bottle) 5, 15, 25 mg or placebo tablet Take one tablet from blue bottle and one tablet from green bottle by mouth daily. Additional study details:   meclizine 25 MG tablet Commonly known as: ANTIVERT Take 25 mg by mouth 3 (three) times daily as needed for dizziness.   metFORMIN 1000 MG tablet Commonly known as: GLUCOPHAGE Take 1,000  mg by mouth 2 (two) times daily with a meal.   Oxycodone HCl 10 MG Tabs Take 10 mg by mouth every 4 (four) hours as needed (for pain).   pantoprazole 40 MG tablet Commonly known as: PROTONIX Take 1 tablet (40 mg total) by mouth daily. Start taking on: June 07, 2019 Replaces: omeprazole 20 MG capsule   polyethylene glycol 17 g packet Commonly known as: MIRALAX / GLYCOLAX Take 17 g by mouth 2 (two) times daily. What changed: when to take this   senna-docusate 8.6-50 MG tablet Commonly known as: Senokot-S Take 1 tablet by mouth at bedtime as needed for mild constipation.   sildenafil 20 MG tablet Commonly known as: REVATIO Take 20-100 mg by mouth as needed (for erectile dysfunction).   valsartan-hydrochlorothiazide 160-12.5 MG tablet Commonly known as: DIOVAN-HCT Take 1 tablet by mouth daily.      Follow-up Information    Star Age, MD  Follow up in 4 week(s).   Specialties: Neurology, Radiology Contact information: [place] [id number] [phone]        [facility] Follow up on 06/20/2019.   Specialty: Cardiology Why: at 11:30AM Contact information: [address] [place]       Marton Redwood, MD Follow up in 1 week(s).   Specialty: Internal Medicine Contact information: [address] [place] [address] [phone]            Time coordinating discharge: 45 minutes  Signed:  Eulogio Bear DO [facility] 06/06/2019, 4:42 PM

## 2019-06-06 NOTE — Interval H&P Note (Signed)
History and Physical Interval Note:  06/06/2019 10:57 AM  Rodney Crane  has presented today for surgery, with the diagnosis of stroke.  The various methods of treatment have been discussed with the patient and family. After consideration of risks, benefits and other options for treatment, the patient has consented to  Procedure(s): LOOP RECORDER INSERTION (N/A) as a surgical intervention.  The patient's history has been reviewed, patient examined, no change in status, stable for surgery.  I have reviewed the patient's chart and labs.  Questions were answered to the patient's satisfaction.     [name]

## 2019-06-13 DIAGNOSIS — I10 Essential (primary) hypertension: Secondary | ICD-10-CM | POA: Diagnosis not present

## 2019-06-13 DIAGNOSIS — I69954 Hemiplegia and hemiparesis following unspecified cerebrovascular disease affecting left non-dominant side: Secondary | ICD-10-CM | POA: Diagnosis not present

## 2019-06-13 DIAGNOSIS — E1140 Type 2 diabetes mellitus with diabetic neuropathy, unspecified: Secondary | ICD-10-CM | POA: Diagnosis not present

## 2019-06-13 DIAGNOSIS — M21379 Foot drop, unspecified foot: Secondary | ICD-10-CM | POA: Diagnosis not present

## 2019-06-13 DIAGNOSIS — K219 Gastro-esophageal reflux disease without esophagitis: Secondary | ICD-10-CM | POA: Diagnosis not present

## 2019-06-13 DIAGNOSIS — E785 Hyperlipidemia, unspecified: Secondary | ICD-10-CM | POA: Diagnosis not present

## 2019-06-13 DIAGNOSIS — E1129 Type 2 diabetes mellitus with other diabetic kidney complication: Secondary | ICD-10-CM | POA: Diagnosis not present

## 2019-06-13 DIAGNOSIS — Z8673 Personal history of transient ischemic attack (TIA), and cerebral infarction without residual deficits: Secondary | ICD-10-CM | POA: Diagnosis not present

## 2019-06-17 ENCOUNTER — Ambulatory Visit: Payer: Medicare Other | Attending: Family

## 2019-06-17 DIAGNOSIS — Z23 Encounter for immunization: Secondary | ICD-10-CM

## 2019-06-17 NOTE — Progress Notes (Signed)
   Covid-19 Vaccination Clinic  Name:  Rodney Crane    MRN: [id number] DOB: 03-19-1951  06/17/2019  [name] was observed post Covid-19 immunization for 15 minutes without incidence. He was provided with Vaccine Information Sheet and instruction to access the V-Safe system.   [name] was instructed to call 911 with any severe reactions post vaccine: Marland Kitchen Difficulty breathing  . Swelling of your face and throat  . A fast heartbeat  . A bad rash all over your body  . Dizziness and weakness    Immunizations Administered    Name Date Dose VIS Date Route   Moderna COVID-19 Vaccine 06/17/2019  1:52 PM 0.5 mL 03/26/2019 Intramuscular   Manufacturer: Moderna   Lot: [id number]   [id number]

## 2019-06-19 ENCOUNTER — Other Ambulatory Visit: Payer: Self-pay | Admitting: Internal Medicine

## 2019-06-19 DIAGNOSIS — R748 Abnormal levels of other serum enzymes: Secondary | ICD-10-CM

## 2019-06-20 ENCOUNTER — Ambulatory Visit (INDEPENDENT_AMBULATORY_CARE_PROVIDER_SITE_OTHER): Payer: Medicare Other | Admitting: *Deleted

## 2019-06-20 ENCOUNTER — Other Ambulatory Visit: Payer: Self-pay

## 2019-06-20 DIAGNOSIS — I639 Cerebral infarction, unspecified: Secondary | ICD-10-CM

## 2019-06-20 LAB — CUP PACEART INCLINIC DEVICE CHECK
Date Time Interrogation Session: 20210225115315
Implantable Pulse Generator Implant Date: 20210211

## 2019-06-20 NOTE — Patient Instructions (Signed)
Call if incision site has drainage, redness or swelling. Device clinic= [phone]

## 2019-06-20 NOTE — Progress Notes (Signed)
ILR wound check in clinic. Steri strips removed. Wound well healed. R-waves 0.54 mV. Home monitor transmitting nightly. No episodes. Questions answered.

## 2019-06-24 ENCOUNTER — Ambulatory Visit
Admission: RE | Admit: 2019-06-24 | Discharge: 2019-06-24 | Disposition: A | Discharge status: Home / Self Care | Payer: Medicare Other | Source: Ambulatory Visit | Attending: Internal Medicine | Admitting: Internal Medicine

## 2019-06-24 DIAGNOSIS — K824 Cholesterolosis of gallbladder: Secondary | ICD-10-CM | POA: Diagnosis not present

## 2019-06-24 DIAGNOSIS — R748 Abnormal levels of other serum enzymes: Secondary | ICD-10-CM

## 2019-06-24 DIAGNOSIS — K8020 Calculus of gallbladder without cholecystitis without obstruction: Secondary | ICD-10-CM | POA: Diagnosis not present

## 2019-06-26 DIAGNOSIS — Z125 Encounter for screening for malignant neoplasm of prostate: Secondary | ICD-10-CM | POA: Diagnosis not present

## 2019-06-26 DIAGNOSIS — E7849 Other hyperlipidemia: Secondary | ICD-10-CM | POA: Diagnosis not present

## 2019-06-26 DIAGNOSIS — E1140 Type 2 diabetes mellitus with diabetic neuropathy, unspecified: Secondary | ICD-10-CM | POA: Diagnosis not present

## 2019-06-27 DIAGNOSIS — R748 Abnormal levels of other serum enzymes: Secondary | ICD-10-CM | POA: Diagnosis not present

## 2019-07-04 DIAGNOSIS — Z0000 Encounter for general adult medical examination without abnormal findings: Secondary | ICD-10-CM | POA: Diagnosis not present

## 2019-07-04 DIAGNOSIS — R42 Dizziness and giddiness: Secondary | ICD-10-CM | POA: Diagnosis not present

## 2019-07-04 DIAGNOSIS — K219 Gastro-esophageal reflux disease without esophagitis: Secondary | ICD-10-CM | POA: Diagnosis not present

## 2019-07-04 DIAGNOSIS — G4733 Obstructive sleep apnea (adult) (pediatric): Secondary | ICD-10-CM | POA: Diagnosis not present

## 2019-07-04 DIAGNOSIS — E1140 Type 2 diabetes mellitus with diabetic neuropathy, unspecified: Secondary | ICD-10-CM | POA: Diagnosis not present

## 2019-07-04 DIAGNOSIS — I10 Essential (primary) hypertension: Secondary | ICD-10-CM | POA: Diagnosis not present

## 2019-07-04 DIAGNOSIS — R748 Abnormal levels of other serum enzymes: Secondary | ICD-10-CM | POA: Diagnosis not present

## 2019-07-04 DIAGNOSIS — E1129 Type 2 diabetes mellitus with other diabetic kidney complication: Secondary | ICD-10-CM | POA: Diagnosis not present

## 2019-07-04 DIAGNOSIS — Z8673 Personal history of transient ischemic attack (TIA), and cerebral infarction without residual deficits: Secondary | ICD-10-CM | POA: Diagnosis not present

## 2019-07-04 DIAGNOSIS — I77811 Abdominal aortic ectasia: Secondary | ICD-10-CM | POA: Diagnosis not present

## 2019-07-04 DIAGNOSIS — M21379 Foot drop, unspecified foot: Secondary | ICD-10-CM | POA: Diagnosis not present

## 2019-07-04 DIAGNOSIS — E785 Hyperlipidemia, unspecified: Secondary | ICD-10-CM | POA: Diagnosis not present

## 2019-07-08 ENCOUNTER — Ambulatory Visit (INDEPENDENT_AMBULATORY_CARE_PROVIDER_SITE_OTHER): Payer: Medicare Other | Admitting: *Deleted

## 2019-07-08 DIAGNOSIS — I639 Cerebral infarction, unspecified: Secondary | ICD-10-CM

## 2019-07-09 ENCOUNTER — Ambulatory Visit: Payer: Medicare Other | Admitting: Neurology

## 2019-07-09 LAB — CUP PACEART REMOTE DEVICE CHECK
Date Time Interrogation Session: 20210315203242
Implantable Pulse Generator Implant Date: 20210211

## 2019-07-09 NOTE — Progress Notes (Signed)
ILR Remote 

## 2019-07-10 ENCOUNTER — Other Ambulatory Visit: Payer: Self-pay

## 2019-07-10 ENCOUNTER — Ambulatory Visit (INDEPENDENT_AMBULATORY_CARE_PROVIDER_SITE_OTHER): Payer: Medicare Other | Admitting: Neurology

## 2019-07-10 ENCOUNTER — Encounter: Payer: Self-pay | Admitting: Neurology

## 2019-07-10 VITALS — BP 158/88 | HR 104 | Ht 75.0 in | Wt 268.0 lb

## 2019-07-10 DIAGNOSIS — Z9989 Dependence on other enabling machines and devices: Secondary | ICD-10-CM | POA: Diagnosis not present

## 2019-07-10 DIAGNOSIS — I63133 Cerebral infarction due to embolism of bilateral carotid arteries: Secondary | ICD-10-CM | POA: Diagnosis not present

## 2019-07-10 DIAGNOSIS — G4733 Obstructive sleep apnea (adult) (pediatric): Secondary | ICD-10-CM | POA: Diagnosis not present

## 2019-07-10 DIAGNOSIS — I639 Cerebral infarction, unspecified: Secondary | ICD-10-CM | POA: Diagnosis not present

## 2019-07-10 NOTE — Progress Notes (Signed)
Subjective:    Patient ID: Rodney Crane is a 69 y.o. male.  HPI     Interim history:   Rodney Crane is a 69 year old right-handed gentleman with an underlying medical history of hypertension, smoking, hyperlipidemia, type 2 diabetes, degenerative back disease, s/p 7 back surgeries, stroke (left thalamic), who presents for follow-up consultation of his stroke. The patient is unaccompanied today and presents for a sooner than schedule appointment d/t recent admission for a stroke. I last saw him on 05/09/2019, at which time he was compliant with his CPAP.   Today, 07/10/2019: He is fully compliant with his CPAP; I reviewed compliance data from 2/14 to 07/08/19. He reports feeling well, at baseline, loop recorder bothers him some at times.  He presented on 06/04/2019 with slurring of speech and numbness around the mouth.  He was admitted on 06/04/2019 and discharged on 06/06/2019.  I reviewed the hospital records.  He reports feeling well, no new symptoms, feels at baseline.  Continues to use his CPAP faithfully, loop recorder bothers him from time to time as an a twinge but otherwise he has had no palpitations or symptoms.  MRI brain showed subacute frontal lobe MCA infarcts, embolic secondary to unknown source.  Echo with an EF of 65 to 70%, no source of embolus, LDL 56, lower extremity Doppler with no DVT.    CTA of the head and neck no E LVO.  Mild left ICA bifurcation.  Evaluated by PT and OT who recommend no further therapies needed.  Evaluated by neurology who recommended loop recorder to evaluate for A. fib as a source of stroke.  A1c was 7.4. He was started on Plavix. He was changed from Prilosec to Protonix due to the potential interaction with his stroke study drug.  He is enrolled in a stroke research study and had a follow-up research visit in our clinic and blood work also.  He has maintained on Plavix.  He feels well, he feels at baseline.  He feels that he needs a new AFO as his old AFO does not  fit well.  He was seen by physical therapy and speech therapy in the hospital and the speech therapist signed off, PT recommended that he seek a new AFO.  He had a CT angiogram head and neck with and without contrast on 06/04/2019 and I reviewed the results:IMPRESSION: 1. No emergent large vessel occlusion or hemodynamically significant stenosis of the head or neck. 2. Mild left carotid bifurcation atherosclerotic calcification without hemodynamically significant stenosis.   He had a repeat brain MRI wo contrast on 06/06/19 and I reviewed the results:  IMPRESSION: Several punctate acute/early subacute cortical and subcortical infarcts within the left greater than right frontal lobes are redemonstrated. No new acute infarct is identified.  Stable generalized parenchymal atrophy and chronic small vessel ischemic disease.  He has elevated liver enzymes, he had an [place] abd on 06/24/19: IMPRESSION: 1. 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 No findings for acute cholecystitis. 2. Normal caliber common bile duct. 3. Suspect fatty infiltration of the liver and calcified granulomas but no worrisome hepatic lesions or biliary dilatation.  He has an appt with GI next month.   The patient's allergies, current medications, family history, past medical history, past social history, past surgical history and problem list were reviewed and updated as appropriate.    Previously (copied from previous notes for reference):    I saw him on 05/07/2018, at which time he was compliant with his CPAP.  I wrote  for a new machine.  He felt stable from the neurological and stroke standpoint.  He was using a cane, he was still using an AFO on the right.    I reviewed his CPAP compliance data from 04/08/2019 through 05/07/2019 which is a total of 30 days, during which time he used his machine every night with percent use days greater than 4 hours at 100%, indicating excellent compliance  with an average usage of 8 hours and 20 minutes, residual AHI at goal at 0.9/h, leak acceptable with a 95th percentile at 10.2 L/min on a pressure of 7 cm with EPR of 2.  Set up date was July of last year.      I saw him on 05/01/2017, at which time he was compliant with his CPAP. I increased his pressure at the time because of residual sleep-disordered breathing noted. From the neurological standpoint he was stable. He was advised to follow-up in one year.   I reviewed his CPAP compliance data from 04/08/2018 through 05/07/2018 which is a total of 30 days, during which time he used his machine 29 days with percent used days greater than 4 hours at 97%, indicating excellent compliance with an average usage of 8 hours and 20 minutes, residual AHI borderline at 6.6 per hour, leak acceptable with the 95th percentile at 8.1 L/m on a pressure of 7 cm with EPR of 3.      Of note, he presented to the emergency room on 08/28/2016 with right thigh pain and swelling. He had no neurological new focal findings. He has a chronic foot drop. He had an interim hospital admission for central cord syndrome and had cervical spine surgery in March 2018. He had an EMG and nerve conduction test in February 2018 which I reviewed: Conclusion: There is electrodiagnostic evidence of chronic length-dependent sensorimotor axonal neuropathy. No suggestion of myopathy/myositis. 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 The Tibialis Anterior showed acute/ongoing denervation which could be due to radiculopathy or neuropathy. I would have expected more demyelinating-rage changes by now on NCV if the etiology of his weakness were due to demyelinating disease (such as Acute Inflammatory Demyelinating Polyneuropathy). Given history of worsening weakness after fall and neck injury, likely severe cervical stenosis as cause for his symptoms.      I saw him on 04/27/2016, at which time  he reported difficulty using his CPAP because of flare up of his back condition. He has had multiple back surgeries and was not able to sleep in his bed. He was using his pain medication sparingly. I encouraged him to try to use CPAP as much is possible. He had no new strokelike symptoms in the interim.   I reviewed his CPAP compliance data from 04/01/2017 through 04/30/2017 which is a total of 30 days, during which time he used his machine every night with percent used days greater than 4 hours at 100%, indicating superb compliance. Average usage of 8 hours and 5 minutes, which is great, residual AHI suboptimal at 10.4 per hour, mostly secondary to obstructive events, leak on the lower end with the 95th percentile at 7.6 L/m on a pressure of 7 cm with EPR of 3.  I saw him on 04/23/2015, at which time he reported doing well.he had a recent MRI with contrast on 01/01/15. He had seen Dr. Einar Gip in cardiology and a stress test, which was fine, per patient. His brain MRI w contrast did show evidence of  an evolving L thalamic infarct. I had suggested a cardiology referral because of chest pains reported.    I reviewed his CPAP compliance data from 03/20/2016 through 04/18/2016, which is a total of 30 days, during which time he used his machine only 8 days. His last 90 day compliance showed full compliance until 03/25/2016.   I saw him on 12/22/2014 at which time he reported a recent history of left facial numbness and tingling and subjective feeling of tongue swelling. On examination he had decreased sensation of his left midface and very slight right grip strength weakness. On MRI brain he had evidence of a recent left thalamic stroke. We talked about stroke prevention, he was advised to continue with Plavix and I suggested a repeat brain MRI with contrast as well as a stroke consult with Dr. Erlinda Hong. I also suggested a cardiology referral due to chest pains reported.   I reviewed his CPAP compliance data from 03/22/15  through 04/20/15, which is a total of 30 days during which time the patient used CPAP every night, with percent used days greater than 4 hours at 100%, indicating superb compliance with an average usage of 7 hours and 6 minutes, residual AHI low at 1 per hour, leak low with the 95th percentile at 5.5 L/m on a pressure of 7 cm with EPR of 3.   I saw him on 01/15/2014 for sleep apnea follow-up at which time the patient reported doing well with CPAP and he was using it and was fully compliant with treatment. He reported that he had residual back pain and may need a spinal cord stimulator. He reported a 20 pound weight loss overall. I suggested a one-year follow-up.   He had a brain MRI without contrast on 12/04/2014: Punctate subacute infarction in the left thalamus. Mild leukoarosis, unchanged. This is most likely due to chronic microvascular disease. Increased signal intensity in the pons that may represent a demyelinating process versus chronic microvascular disease, unchanged. He had a carotid Doppler study at [facility] on 12/10/2014 with impression: Minimal bilateral atherosclerosis without flow limiting stenosis, less than 50% stenosis bilaterally.   He reports that he started having some symptoms about a month ago. He started having numbness and tingling in his left face and his tongue felt swollen. He had changes in his taste and report some loss of appetite. He has lost weight. He had some grip strength weakness on the right which his wife noted that he tried to downplay it at the time. She became really concerned that she also noted slurring of speech intermittently, especially when he was tired and fatigued. He reports significant fatigue for the past month or 2. He has a history of foot drop and low back pain and these symptoms seem unchanged. Since his MRI earlier this month he was told to stop his baby aspirin and start Plavix. He has been taking Plavix regularly since then. He  does admit that he was not always fully compliant with his baby aspirin. He had some intermittent chest pain. He did not bring this up necessarily at his doctor's appointment. He tries to work out regularly. He has been exercising at the gym 3 to even 4 times a week. He has not seen a cardiologist in years. He does admit to smoking some cigarettes. His wife provides additional information. She states that he tries to drink water and avoid sodas. He has improved in that regard. He still may not drink enough water. He is fully  compliant with CPAP therapy. He had recent supplies delivered.   He had an MRI lumbar spine with and without contrast through Dr. Susa Day on 10/18/2006 (Indication: Left-sided back pain radiating to both legs. Weakness and numbness on the left side, ongoing for 60 months. History per lumbar surgery): 1. Multilevel central, foraminal, and subarticular lateral recess stenoses as detailed above, with the most striking findings at the L3-L4 level. 2. There is a suggestion of dural enhancement and potentially peripheral nerve root clumping in the lower portion of the thecal sac, suspicious for mild arachnoiditis.   I reviewed his CPAP compliance data from 11/21/2014 through 12/20/2014 which is a total of 30 days during which time he used his machine every night with percent used days greater than 4 hours at 100%, indicating superb compliance with an average usage of 7 hours and 16 minutes, residual AHI at 0.8 per hour, leaked low with the 95th percentile at 3.5 L/m on a pressure of 7 cm with EPR of 3.   I saw him on 07/15/2013, at which time we talked about his sleep study results as well as his compliance data. He reported having adjusted well to CPAP, but he had nasal congestion and some nasal dryness. He felt better and his wife endorsed that he slept better.   I reviewed his compliance data from 12/14/2013 through 01/12/2014 which is a total of 30 days during which time he used  CPAP every night. Percent used days greater than 4 hours was 100%, indicating superb compliance. Average usage was 8 hours and 12 minutes, residual AHI low at 1.2 per hour and leak very low at 1.2 L per minute at the 95th percentile. Pressure at 7 cm with EPR of 3.     I first met him on 04/12/2013, at which time he complained of snoring and excessive daytime somnolence. I suggested he have a sleep study. He had a split-night sleep study on 05/28/2013 and went over his test results with him in detail today. His baseline sleep efficiency was reduced at 68.1% with a latency to sleep of 33 minutes and wake after sleep onset of 46.5 minutes with moderate to severe sleep fragmentation noted. His arousal index was mildly elevated. He had an increased percentage of stage I and stage II sleep, absence of slow-wave sleep, and a reduced percentage of REM sleep with a mildly prolonged REM latency. He had no significant EKG changes or periodic leg movements of sleep. He had moderate to loud snoring. He had a total AHI of 15.2 per hour, rising to 38 per hour in REM sleep. His baseline oxygen saturation was 95% with a nadir of 78% during REM sleep. He was then titrated on CPAP. His sleep efficiency improved. His arousal index improved. His sleep architecture showed an increased percentage of stage II sleep, absence of slow-wave sleep and a reduced percentage of REM sleep. Average oxygen saturation was 96%, nadir was 92%, greatly improved. He was titrated on CPAP from 5-7 cm with a reduction of his AHI to 0 per hour on the final pressure. He had REM sleep but no supine sleep was achieved during this part of the study. He indicated that he does not sleep on his back. He is status post multiple back operations. Based on the test results I prescribed CPAP for him.   I reviewed the patient's CPAP compliance data from 06/09/2013 to 07/08/2013, which is a total of 30 days, during which time the patient used CPAP every day  except for  1 day. The average usage for all days was 7 hours and 44 minutes. The percent used days greater than 4 hours was 97 %, indicating excellent compliance. The residual AHI was 1.3 per hour, indicating an appropriate treatment pressure of 7 cwp with EPR of 3.     I reviewed his compliance data of with his machine: 06/10/2013 through 07/14/2013 which is a total of 35 days during which time use CPAP every night with a percent used days greater than 4 hours of 100%, indicating excellent compliance, average usage was 7 hours and 47 minutes, residual AHI low at 1.3 per hour and leak was low at 1.4. Pressure was 7 cm with EPR of 3.  His Past Medical History Is Significant For: Past Medical History:  Diagnosis Date  . Abdominal aortic ectasia ([place])   . Arthritis   . Cervical spondylosis   . Colon polyps   . CVA (cerebral infarction) 2010  . Diabetes mellitus   . Dizziness   . DVT (deep venous thrombosis) ([facility])    bilateral  . ED (erectile dysfunction)   . Foot drop   . H/O Bell's palsy   . Hyperlipidemia   . Hypertension   . Obesity   . OSA on CPAP   . Raynauds syndrome   . Restrictive lung disease   . Spinal stenosis   . Stroke ([place])   . Unsteady gait     His Past Surgical History Is Significant For: Past Surgical History:  Procedure Laterality Date  . CARPAL TUNNEL RELEASE Bilateral   . CERVICAL DISCECTOMY  2004  . LOOP RECORDER INSERTION N/A 06/06/2019   Procedure: LOOP RECORDER INSERTION;  Surgeon: [name], MD;  Location: [facility];  Service: Cardiovascular;  Laterality: N/A;  . LUMBAR DISC SURGERY     x6  . POSTERIOR CERVICAL FUSION/FORAMINOTOMY N/A 06/27/2016   Procedure: POSTERIOR CERVICAL FUSION C2-C5; C2-C4 Decompression;  Surgeon: Melina Schools, MD;  Location: [facility];  Service: Orthopedics;  Laterality: N/A;    His Family History Is Significant For: Family History  Problem Relation Age of Onset  . Multiple sclerosis Sister   . Dementia Mother   . Diabetes  Mother   . Lung cancer Father   . Sleep apnea Brother   . Hypertension Brother   . Colon cancer Neg Hx   . Stomach cancer Neg Hx     His Social History Is Significant For: Social History   Socioeconomic History  . Marital status: Married    Spouse name: Nicki Reaper  . Number of children: 2  . Years of education: 6  . Highest education level: Not on file  Occupational History  . Occupation: RETIRED    Comment: retired  Tobacco Use  . Smoking status: Former Smoker    Types: Cigarettes    Quit date: 01/21/2011    Years since quitting: 8.4  . Smokeless tobacco: Never Used  . Tobacco comment: still smokes occasional cigars  Substance and Sexual Activity  . Alcohol use: No  . Drug use: No  . Sexual activity: Not on file  Other Topics Concern  . Not on file  Social History Narrative   Patient is right handed and resides with wife   Social Determinants of Health   Financial Resource Strain:   . Difficulty of Paying Living Expenses:   Food Insecurity:   . Worried About Charity fundraiser in the Last Year:   . [place] in the  Last Year:   Transportation Needs:   . Film/video editor (Medical):   Marland Kitchen Lack of Transportation (Non-Medical):   Physical Activity:   . Days of Exercise per Week:   . Minutes of Exercise per Session:   Stress:   . Feeling of Stress :   Social Connections:   . Frequency of Communication with Friends and Family:   . Frequency of Social Gatherings with Friends and Family:   . Attends Religious Services:   . Active Member of Clubs or Organizations:   . Attends Archivist Meetings:   Marland Kitchen Marital Status:     His Allergies Are:  No Known Allergies:   His Current Medications Are:  Outpatient Encounter Medications as of 07/10/2019  Medication Sig  . ACCU-CHEK GUIDE test strip USE TO TEST BLOOD SUGAR DAILY  . atorvastatin (LIPITOR) 40 MG tablet Take 0.5 tablets (20 mg total) by mouth at bedtime. (Patient taking differently: Take 40  mg by mouth at bedtime. )  . blood glucose meter kit and supplies KIT Dispense based on patient and insurance preference. Use up to four times daily as directed. (FOR ICD-9 250.00, 250.01).  Marland Kitchen clopidogrel (PLAVIX) 75 MG tablet Take 75 mg by mouth daily.   . cyclobenzaprine (FLEXERIL) 10 MG tablet Take 1 tablet (10 mg total) by mouth every 8 (eight) hours as needed for muscle spasms.  . Investigational - Study Medication Study name: [place] stroke Drug: BAY [phone] (Green bottle) 5, 15, 25 mg or placebo tablet Take one tablet from blue bottle and one tablet from green bottle by mouth daily. Additional study details:  . meclizine (ANTIVERT) 25 MG tablet Take 25 mg by mouth 3 (three) times daily as needed for dizziness.  . metFORMIN (GLUCOPHAGE) 1000 MG tablet Take 1,000 mg by mouth 2 (two) times daily with a meal.  . Oxycodone HCl 10 MG TABS Take 10 mg by mouth every 4 (four) hours as needed (for pain).  . pantoprazole (PROTONIX) 40 MG tablet Take 1 tablet (40 mg total) by mouth daily.  . polyethylene glycol (MIRALAX / GLYCOLAX) packet Take 17 g by mouth 2 (two) times daily. (Patient taking differently: Take 17 g by mouth daily. )  . sildenafil (REVATIO) 20 MG tablet Take 20-100 mg by mouth as needed (for erectile dysfunction).   . Study - PACIFIC (STROKE) - BAY [id number] (Blue Bottle) 5, 15, 25 mg or placebo tablet (PI-Sethi) Take 1 tablet by mouth daily. For Investigational Use Only. Take with 1 tablet from Green Bottle for a total of 10, 20, 50 mg or placebo daily. Swallow whole with a glass of water; do not crush or chew. PACIFIC (STROKE) STUDY MEDICATION - BAY [id number] (Blue Bottle) 5, 15, 25 mg or placebo tablet (PI-Sethi)  . valsartan-hydrochlorothiazide (DIOVAN-HCT) 160-12.5 MG tablet Take 1 tablet by mouth daily.  . [DISCONTINUED] senna-docusate (SENOKOT-S) 8.6-50 MG tablet Take 1 tablet by mouth at bedtime as needed for mild constipation.   No facility-administered encounter medications on  file as of 07/10/2019.  :  Review of Systems:  Out of a complete 14 point review of systems, all are reviewed and negative with the exception of these symptoms as listed below: Review of Systems  Neurological:       Pt presents today to discuss his recent hospitalization for stroke. He reports that he has been doing well. His cpap is going well.     Objective:  Neurological Exam  Physical Exam Physical Examination:   Vitals:  07/10/19 1353  BP: (!) 158/88  Pulse: (!) 104    General Examination: The patient is a very pleasant 69 y.o. male in no acute distress. He appears well-developed and well-nourished and well groomed.   HEENT:Normocephalic, atraumatic, pupils are equal, round and reactive to light, extraocular tracking is good without limitation to gaze excursion or nystagmus noted. Normal smooth pursuit is noted.Corrective eyeglasses in place.Hearing is grossly intact. Face is symmetric with normal facial animation and slight decrease in sensation in the right midface. Speech is clear,no dysarthria noted and no hypophonia. There is no lip, neck/head, jaw or voice tremor. Neckwith mildly decreased ROM. Oropharynx exam reveals: no significant changes, but mild mouth dryness noted.  Chest:Clear to auscultation without wheezing, rhonchi or crackles noted.  Heart:S1+S2+0, regular and normal without murmurs, rubs or gallops noted.   Abdomen:Soft, non-tender and non-distended.  Extremities:There isnoedema in the distal lower extremities bilaterally.R AFO in place for chronic R foot drop.His right leg is smaller in caliber than the L, unchanged.   Skin: Warm and dry without trophic changes noted. There are no varicose veins.  Musculoskeletal: exam reveals no obvious joint deformities, tenderness or joint swelling or erythema.   Neurologically:  Mental status: The patient is awake, alert and oriented in all 4 spheres. His memory, attention, language and  knowledge are appropriate. There is no aphasia, agnosia, apraxia or anomia. Speech is clear with normal prosody and enunciation. Thought process is linear. Mood is congruent and affect is normal.  Cranial nerves are as described above under HEENT exam.  Motor exam:mild hand muscle wasting stable, global strength of 4 out of 5. Slightly weaker on the right side, Right foot drop, has a brace around the right ankle.Romberg is not tested for safety.Fine motor skills are mildly impaired. Cerebellar testing shows no dysmetria or intention tremor. There is no truncal or gait ataxia.  Sensory exam is intact to light touch. Gait, station and balance: His posture is fairly good Today, he walks with a single-point cane, no obvious limp, but does walk a little slowly and cautiously, stable overall.  Assessment and Plan:   In summary, Rodney Crane is a very pleasant2 year old male with an underlying medical history of hypertension, prior smoking, hyperlipidemia, type 2 diabetes, degenerative back disease, s/p multiple back surgeris, TIAs, L left thalamic stroke, and now b/l frontal lobe embolic strokes, who presents for FU consultation of his stroke, after recent hospitalization.  He had full stroke work-up in the hospital, he is now on Plavix and a study drug.  He is faithful with his sleep apnea treatment.  He feels at baseline, has a loop recorder in place.  He has a GI follow-up next month with Dr. because of elevated liver enzymes noted.  Ultrasound of the abdomen was done this month.  We talked about the importance of stroke secondary prevention. He is advised to continue to stay active mentally and physically.  He is advised to stay well-hydrated and follow-up routinely in about 6 months, and then hopefully once a year.  I answered all his questions today and he was in agreement. I spent 40 minutes in total face-to-face time and in reviewing records during pre-charting, more than 50% of which was  spent in counseling and coordination of care, reviewing test results, reviewing medications and treatment regimen and/or in discussing or reviewing the diagnosis of stroke, OSA, the prognosis and treatment options. Pertinent laboratory and imaging test results that were available during this visit with the patient were reviewed  by me and considered in my medical decision making (see chart for details).

## 2019-07-10 NOTE — Patient Instructions (Signed)
It's good to see you again and I am glad you are doing well.  You had a full stroke work-up in the hospital and now have a loop recorder which they will monitor as you know, looking for irregular heartbeat called A. fib primarily.  You are also on a stroke study drug.  Please continue follow-up with the research team for this.  Please follow-up with me in about 4 to 7 months, we will try to coordinate your visit for when you come back for one of your research visits here.  Hopefully, we will see you yearly after that in my clinic. Continue exercising regularly and take your medications as directed. As discussed, secondary prevention is key after a stroke. This means: taking care of blood sugar values or diabetes management (A1c goal of less than 7.0), good blood pressure (hypertension) control and optimizing cholesterol management (with LDL goal of less than 70), exercising daily or regularly within your own mobility limitations of course, and overall cardiovascular risk factor reduction, which includes screening for and treatment of obstructive sleep apnea (OSA) and weight management.  Please hydrate well with water, 6 to 8 cups/day are recommended generally speaking.  Please continue to work on weight loss.  Please also follow-up for your AFO, you may need a new brace as it is loose.

## 2019-07-12 DIAGNOSIS — I10 Essential (primary) hypertension: Secondary | ICD-10-CM | POA: Diagnosis not present

## 2019-07-12 DIAGNOSIS — R82998 Other abnormal findings in urine: Secondary | ICD-10-CM | POA: Diagnosis not present

## 2019-07-19 DIAGNOSIS — Z1212 Encounter for screening for malignant neoplasm of rectum: Secondary | ICD-10-CM | POA: Diagnosis not present

## 2019-07-23 ENCOUNTER — Ambulatory Visit: Payer: Medicare Other | Attending: Family

## 2019-07-23 DIAGNOSIS — Z23 Encounter for immunization: Secondary | ICD-10-CM

## 2019-07-23 NOTE — Progress Notes (Signed)
   Covid-19 Vaccination Clinic  Name:  Rodney Crane    MRN: [id number] DOB: 08/25/1950  07/23/2019  Rodney Crane was observed post Covid-19 immunization for 15 minutes without incident. He was provided with Vaccine Information Sheet and instruction to access the V-Safe system.   Rodney Crane was instructed to call 911 with any severe reactions post vaccine: Marland Kitchen Difficulty breathing  . Swelling of face and throat  . A fast heartbeat  . A bad rash all over body  . Dizziness and weakness   Immunizations Administered    Name Date Dose VIS Date Route   Moderna COVID-19 Vaccine 07/23/2019 12:48 PM 0.5 mL 03/26/2019 Intramuscular   Manufacturer: Moderna   LotFP:[id number]   [place]'OrBE:[id number]

## 2019-08-05 ENCOUNTER — Ambulatory Visit: Payer: Medicare Other | Admitting: Podiatry

## 2019-08-05 DIAGNOSIS — M961 Postlaminectomy syndrome, not elsewhere classified: Secondary | ICD-10-CM | POA: Diagnosis not present

## 2019-08-05 DIAGNOSIS — G894 Chronic pain syndrome: Secondary | ICD-10-CM | POA: Diagnosis not present

## 2019-08-05 DIAGNOSIS — M5136 Other intervertebral disc degeneration, lumbar region: Secondary | ICD-10-CM | POA: Diagnosis not present

## 2019-08-05 DIAGNOSIS — Z79891 Long term (current) use of opiate analgesic: Secondary | ICD-10-CM | POA: Diagnosis not present

## 2019-08-07 ENCOUNTER — Other Ambulatory Visit (HOSPITAL_COMMUNITY): Payer: Self-pay | Admitting: Neurology

## 2019-08-07 DIAGNOSIS — I63319 Cerebral infarction due to thrombosis of unspecified middle cerebral artery: Secondary | ICD-10-CM

## 2019-08-08 ENCOUNTER — Ambulatory Visit (INDEPENDENT_AMBULATORY_CARE_PROVIDER_SITE_OTHER): Payer: Medicare Other | Admitting: *Deleted

## 2019-08-08 ENCOUNTER — Ambulatory Visit (INDEPENDENT_AMBULATORY_CARE_PROVIDER_SITE_OTHER): Payer: Medicare Other | Admitting: Podiatry

## 2019-08-08 ENCOUNTER — Other Ambulatory Visit: Payer: Self-pay

## 2019-08-08 ENCOUNTER — Encounter: Payer: Self-pay | Admitting: Podiatry

## 2019-08-08 VITALS — Temp 97.2°F

## 2019-08-08 DIAGNOSIS — M79675 Pain in left toe(s): Secondary | ICD-10-CM

## 2019-08-08 DIAGNOSIS — L84 Corns and callosities: Secondary | ICD-10-CM | POA: Diagnosis not present

## 2019-08-08 DIAGNOSIS — B351 Tinea unguium: Secondary | ICD-10-CM | POA: Diagnosis not present

## 2019-08-08 DIAGNOSIS — M79674 Pain in right toe(s): Secondary | ICD-10-CM | POA: Diagnosis not present

## 2019-08-08 DIAGNOSIS — I639 Cerebral infarction, unspecified: Secondary | ICD-10-CM

## 2019-08-08 DIAGNOSIS — E1142 Type 2 diabetes mellitus with diabetic polyneuropathy: Secondary | ICD-10-CM

## 2019-08-08 DIAGNOSIS — M21371 Foot drop, right foot: Secondary | ICD-10-CM | POA: Diagnosis not present

## 2019-08-08 NOTE — Progress Notes (Signed)
This patient returns to my office for at risk foot care.  This patient requires this care by a professional since this patient will be at risk due to having type 2 diabetes and CVA.  This patient is unable to cut nails himself since the patient cannot reach his nails.These nails are painful walking and wearing shoes.  This patient presents for at risk foot care today.  General Appearance  Alert, conversant and in no acute stress.  Vascular  Dorsalis pedis and posterior tibial  pulses are palpable  bilaterally.  Capillary return is within normal limits  bilaterally. Temperature is within normal limits  bilaterally.  Neurologic  Senn-Weinstein monofilament wire test diminished   bilaterally. Muscle power within normal limits bilaterally.  Nails Thick disfigured discolored nails with subungual debris  from hallux to fifth toes bilaterally. No evidence of bacterial infection or drainage bilaterally.  Orthopedic  No limitations of motion  feet .  No crepitus or effusions noted.  No bony pathology or digital deformities noted.  Skin  normotropic skin with no porokeratosis noted bilaterally.  No signs of infections or ulcers noted.   Asymptomatic heel callus  Onychomycosis  Pain in right toes  Pain in left toes  Consent was obtained for treatment procedures.   Mechanical debridement of nails 1-5  bilaterally performed with a nail nipper.  Filed with dremel without incident.    Return office visit    3 months                 Told patient to return for periodic foot care and evaluation due to potential at risk complications.   Estefany Goebel DPM  

## 2019-08-09 LAB — CUP PACEART REMOTE DEVICE CHECK
Date Time Interrogation Session: 20210415203632
Implantable Pulse Generator Implant Date: 20210211

## 2019-08-09 NOTE — Progress Notes (Signed)
ILR Remote 

## 2019-08-19 ENCOUNTER — Ambulatory Visit (INDEPENDENT_AMBULATORY_CARE_PROVIDER_SITE_OTHER): Payer: Medicare Other | Admitting: Internal Medicine

## 2019-08-19 ENCOUNTER — Other Ambulatory Visit (INDEPENDENT_AMBULATORY_CARE_PROVIDER_SITE_OTHER): Payer: Medicare Other

## 2019-08-19 ENCOUNTER — Encounter: Payer: Self-pay | Admitting: Internal Medicine

## 2019-08-19 VITALS — BP 138/80 | HR 80 | Temp 98.5°F | Ht 74.0 in | Wt 267.0 lb

## 2019-08-19 DIAGNOSIS — I639 Cerebral infarction, unspecified: Secondary | ICD-10-CM

## 2019-08-19 DIAGNOSIS — R7989 Other specified abnormal findings of blood chemistry: Secondary | ICD-10-CM

## 2019-08-19 LAB — HEPATIC FUNCTION PANEL
ALT: 30 U/L (ref 0–53)
AST: 31 U/L (ref 0–37)
Albumin: 4.1 g/dL (ref 3.5–5.2)
Alkaline Phosphatase: 294 U/L — ABNORMAL HIGH (ref 39–117)
Bilirubin, Direct: 0.2 mg/dL (ref 0.0–0.3)
Total Bilirubin: 0.5 mg/dL (ref 0.2–1.2)
Total Protein: 8.2 g/dL (ref 6.0–8.3)

## 2019-08-19 LAB — IBC + FERRITIN
Ferritin: 31.1 ng/mL (ref 22.0–322.0)
Iron: 63 ug/dL (ref 42–165)
Saturation Ratios: 13.8 % — ABNORMAL LOW (ref 20.0–50.0)
Transferrin: 325 mg/dL (ref 212.0–360.0)

## 2019-08-19 LAB — PROTIME-INR
INR: 1.1 ratio — ABNORMAL HIGH (ref 0.8–1.0)
Prothrombin Time: 12 s (ref 9.6–13.1)

## 2019-08-19 LAB — GAMMA GT: GGT: 407 U/L — ABNORMAL HIGH (ref 7–51)

## 2019-08-19 NOTE — Patient Instructions (Addendum)
If you are age 69 or older, your body mass index should be between 23-30. Your Body mass index is 34.28 kg/m. If this is out of the aforementioned range listed, please consider follow up with your Primary Care Provider.  If you are age 72 or younger, your body mass index should be between 19-25. Your Body mass index is 34.28 kg/m. If this is out of the aformentioned range listed, please consider follow up with your Primary Care Provider.   Your provider has requested that you go to the basement level for lab work before leaving today. Press "B" on the elevator. The lab is located at the first door on the left as you exit the elevator.  Due to recent changes in healthcare laws, you may see the results of your imaging and laboratory studies on MyChart before your provider has had a chance to review them.  We understand that in some cases there may be results that are confusing or concerning to you. Not all laboratory results come back in the same time frame and the provider may be waiting for multiple results in order to interpret others.  Please give [place] 48 hours in order for your provider to thoroughly review all the results before contacting the office for clarification of your results.   Please follow up on _____________________  It was a pleasure to see you today!  Dr. Henrene Pastor

## 2019-08-19 NOTE — Progress Notes (Signed)
HISTORY OF PRESENT ILLNESS:  Rodney Crane is a 69 y.o. male, [place] negative in [place], who has been followed in this office for GERD and adenomatous colon polyps.  He has multiple significant problems including diabetes and cerebrovascular disease for which she is on Plavix.  Patient sent today by his primary care provider Dr. Brigitte Pulse regarding elevated alkaline phosphatase level.  Patient was hospitalized in February with a stroke.  He has recovered his deficits.  He was noted to have alkaline phosphatase elevation of 318 (126 upper limit of normal).  AST was mildly elevated at 42.  Other tests normal.  Normal MCV and platelets.  He was told by his PCP that his alkaline phosphatase has been slightly elevating in recent years.  It was normal 2 years ago.  Abdominal ultrasound was performed June 24, 2019.  He was noted to have cholelithiasis.  The common duct was not dilated.  Changes in the hepatic parenchyma were noted and said to be consistent with calcified granulomas.  Patient denies a personal or family history of liver disease.  He does not use alcohol.  No new medications.  His last surveillance colonoscopy was performed November 27, 2018.  REVIEW OF SYSTEMS:  All non-GI ROS negative unless otherwise stated in the HPI except for arthritis  Past Medical History:  Diagnosis Date  . Abdominal aortic ectasia ([place])   . Arthritis   . Cervical spondylosis   . Colon polyps   . CVA (cerebral infarction) 2010  . Diabetes mellitus   . Dizziness   . DVT (deep venous thrombosis) ([facility])    bilateral  . ED (erectile dysfunction)   . Foot drop   . H/O Bell's palsy   . Hyperlipidemia   . Hypertension   . Obesity   . OSA on CPAP   . Raynauds syndrome   . Restrictive lung disease   . Spinal stenosis   . Stroke ([place])   . Unsteady gait     Past Surgical History:  Procedure Laterality Date  . CARPAL TUNNEL RELEASE Bilateral   . CERVICAL DISCECTOMY  2004  . LOOP RECORDER INSERTION N/A 06/06/2019    Procedure: LOOP RECORDER INSERTION;  Surgeon: [name], MD;  Location: [facility];  Service: Cardiovascular;  Laterality: N/A;  . LUMBAR DISC SURGERY     x6  . POSTERIOR CERVICAL FUSION/FORAMINOTOMY N/A 06/27/2016   Procedure: POSTERIOR CERVICAL FUSION C2-C5; C2-C4 Decompression;  Surgeon: Melina Schools, MD;  Location: [facility];  Service: Orthopedics;  Laterality: N/A;    Social History Rodney Crane  reports that he quit smoking about 8 years ago. His smoking use included cigarettes. He has never used smokeless tobacco. He reports that he does not drink alcohol or use drugs.  family history includes Dementia in his mother; Diabetes in his mother; Hypertension in his brother; Lung cancer in his father; Multiple sclerosis in his sister; Sleep apnea in his brother.  No Known Allergies     PHYSICAL EXAMINATION: Vital signs: BP 138/80   Pulse 80   Temp 98.5 F (36.9 C)   Ht 6\' 2"  (1.88 m)   Wt 267 lb (121.1 kg)   BMI 34.28 kg/m   Constitutional: generally well-appearing, no acute distress Psychiatric: alert and oriented x3, cooperative Eyes: extraocular movements intact, anicteric, conjunctiva pink Mouth: oral pharynx moist, no lesions Neck: supple no lymphadenopathy Cardiovascular: heart regular rate and rhythm, no murmur Lungs: clear to auscultation bilaterally Abdomen: soft, nontender, nondistended, no obvious ascites, no peritoneal signs,  normal bowel sounds, no organomegaly Rectal: Omitted Extremities: no clubbing, cyanosis, or lower extremity edema bilaterally Skin: no lesions on visible extremities Neuro: No focal deficits.  Cranial nerves intact  ASSESSMENT:  1.  Elevated alkaline phosphatase less than 3 times upper limit of normal.  Ultrasound suggesting calcified granulomatous disease.  Incidental gallstones without biliary obstruction.  Hepatic synthetic function is intact without evidence of acute or chronic liver disease. 2.  History of adenomatous colon  polyps.  Surveillance up-to-date 3.  Multiple medical problems  PLAN:  1.  Check 5 prime nucleotidase and GGT to confirm liver origin 2.  Additional supplemental laboratories including ANA and other serologies as well as chronic hepatitis panel. 3.  Office follow-up 1 month. A total time of 30 minutes was spent preparing to see the patient, reviewing his recent hospitalization, laboratories, and x-rays, obtaining history and performing comprehensive physical examination.  Also counseling the patient regarding his above listed issues, ordering advanced testing and documenting clinical information in the health record

## 2019-08-26 LAB — ANTI-NUCLEAR AB-TITER (ANA TITER): ANA Titer 1: 1:80 {titer} — ABNORMAL HIGH

## 2019-08-26 LAB — ANTI-SMOOTH MUSCLE ANTIBODY, IGG: Actin (Smooth Muscle) Antibody (IGG): 27 U — ABNORMAL HIGH (ref <20)

## 2019-08-26 LAB — HEPATITIS B SURFACE ANTIBODY,QUALITATIVE: Hep B S Ab: NONREACTIVE

## 2019-08-26 LAB — HEPATITIS C ANTIBODY
Hepatitis C Ab: NONREACTIVE
SIGNAL TO CUT-OFF: 0.04 (ref <1.00)

## 2019-08-26 LAB — HEPATITIS B SURFACE ANTIGEN: Hepatitis B Surface Ag: NONREACTIVE

## 2019-08-26 LAB — MITOCHONDRIAL ANTIBODIES: Mitochondrial M2 Ab, IgG: <=20 U

## 2019-08-26 LAB — ANA: Anti Nuclear Antibody (ANA): POSITIVE — AB

## 2019-08-26 LAB — NUCLEOTIDASE, 5', BLOOD: 5-Nucleotidase: 22 U/L — ABNORMAL HIGH (ref 0–10)

## 2019-08-27 DIAGNOSIS — M65321 Trigger finger, right index finger: Secondary | ICD-10-CM | POA: Insufficient documentation

## 2019-08-27 DIAGNOSIS — M79642 Pain in left hand: Secondary | ICD-10-CM | POA: Diagnosis not present

## 2019-08-27 DIAGNOSIS — M79641 Pain in right hand: Secondary | ICD-10-CM | POA: Diagnosis not present

## 2019-09-02 ENCOUNTER — Other Ambulatory Visit (HOSPITAL_COMMUNITY): Payer: Medicare Other

## 2019-09-09 ENCOUNTER — Ambulatory Visit (INDEPENDENT_AMBULATORY_CARE_PROVIDER_SITE_OTHER): Payer: Medicare Other | Admitting: *Deleted

## 2019-09-09 DIAGNOSIS — I639 Cerebral infarction, unspecified: Secondary | ICD-10-CM

## 2019-09-09 LAB — CUP PACEART REMOTE DEVICE CHECK
Date Time Interrogation Session: 20210516203249
Implantable Pulse Generator Implant Date: 20210211

## 2019-09-10 NOTE — Progress Notes (Signed)
[facility] Summary Report / Loop Recorder 

## 2019-09-24 ENCOUNTER — Encounter: Payer: Self-pay | Admitting: Internal Medicine

## 2019-09-24 ENCOUNTER — Ambulatory Visit (INDEPENDENT_AMBULATORY_CARE_PROVIDER_SITE_OTHER): Payer: Medicare Other | Admitting: Internal Medicine

## 2019-09-24 VITALS — BP 126/80 | HR 101 | Ht 75.0 in | Wt 266.1 lb

## 2019-09-24 DIAGNOSIS — I639 Cerebral infarction, unspecified: Secondary | ICD-10-CM | POA: Diagnosis not present

## 2019-09-24 DIAGNOSIS — R7989 Other specified abnormal findings of blood chemistry: Secondary | ICD-10-CM

## 2019-09-24 DIAGNOSIS — R935 Abnormal findings on diagnostic imaging of other abdominal regions, including retroperitoneum: Secondary | ICD-10-CM | POA: Diagnosis not present

## 2019-09-24 NOTE — Progress Notes (Signed)
HISTORY OF PRESENT ILLNESS:  Rodney Crane is a 69 y.o. male, [place] native and [employer] fan, who has been followed in this office for GERD and adenomatous colon polyps.  He has multiple medical problems as listed below.  On Plavix for history of cerebrovascular disease.  Evaluated more recently on August 19, 2019 regarding elevated alkaline phosphatase level less than 3 times the upper limit of normal.  Ultrasound suggesting calcified granulomatous disease, incidental gallstones without biliary obstruction, hepatic synthetic function intact without evidence of acute or chronic liver disease.  5 prime nucleotidase and GGT were obtained.  Both are elevated confirming liver origin of alkaline phosphatase elevation.  Multiple other laboratories including viral studies and serologies were normal or negative except for mild elevation of ANA and ASMA.  Normal AMA.  Normal INR and platelets.  He presents today for follow-up.  No interval problems.  I reviewed with him his work-up to date in detail.  REVIEW OF SYSTEMS:  All non-GI ROS negative unless otherwise stated in the HPI except for urine leakage, back pain, arthritis, visual change  Past Medical History:  Diagnosis Date  . Abdominal aortic ectasia ([place])   . Arthritis   . Cervical spondylosis   . Colon polyps   . CVA (cerebral infarction) 2010  . Diabetes mellitus   . Dizziness   . DVT (deep venous thrombosis) ([facility])    bilateral  . ED (erectile dysfunction)   . Foot drop   . H/O Bell's palsy   . Hyperlipidemia   . Hypertension   . Obesity   . OSA on CPAP   . Raynauds syndrome   . Restrictive lung disease   . Spinal stenosis   . Stroke ([place])   . Unsteady gait     Past Surgical History:  Procedure Laterality Date  . CARPAL TUNNEL RELEASE Bilateral   . CERVICAL DISCECTOMY  2004  . LOOP RECORDER INSERTION N/A 06/06/2019   Procedure: LOOP RECORDER INSERTION;  Surgeon: [name], MD;  Location: [facility];  Service:  Cardiovascular;  Laterality: N/A;  . LUMBAR DISC SURGERY     x6  . POSTERIOR CERVICAL FUSION/FORAMINOTOMY N/A 06/27/2016   Procedure: POSTERIOR CERVICAL FUSION C2-C5; C2-C4 Decompression;  Surgeon: Melina Schools, MD;  Location: [facility];  Service: Orthopedics;  Laterality: N/A;    Social History Marcellus Pulliam  reports that he quit smoking about 8 years ago. His smoking use included cigarettes. He has never used smokeless tobacco. He reports that he does not drink alcohol or use drugs.  family history includes Dementia in his mother; Diabetes in his mother; Hypertension in his brother; Lung cancer in his father; Multiple sclerosis in his sister; Sleep apnea in his brother.  No Known Allergies     PHYSICAL EXAMINATION: Vital signs: BP 126/80   Pulse (!) 101   Ht _0  (1.905 m)   Wt 266 lb 2 oz (120.7 kg)   BMI 33.26 kg/m   Constitutional: generally well-appearing, no acute distress Psychiatric: alert and oriented x3, cooperative Eyes: Anicteric Abdomen:  not reexamined Neuro: No focal deficits apparent.    ASSESSMENT:  1.  Elevated alkaline phosphatase level less than 3 times the upper limit of normal.  Negative extensive work-up except for mild elevation of autoimmune markers.  Possible calcified granulomas of the liver on ultrasound.  Indeed confirmed liver origin of alkaline phosphatase elevation.  Possible etiologic considerations include granulomatous liver disease, autoimmune cholangiopathy, PSC, or other less common entities. 2.  GERD.  Well-controlled  on pantoprazole 3.  History of adenomatous colon polyps.  Surveillance up-to-date.  Last examination August 2020   PLAN:  1.  MRI of the liver and MRCP to rule out space-occupying lesions of the liver and evaluate for changes of PSC. 2.  Repeat liver test 6 months 3.  Routine office follow-up 1 year 4.  There exists a potential role for liver biopsy, but not felt necessary at this time.  We discussed this in detail. Total  time of 30 minutes was spent preparing to see the patient, reviewing a myriad of test, obtaining interval history, performing medically appropriate follow-up exam, counseling the patient regarding his above listed issues, ordering advanced imaging, laboratories, and follow-up.  Finally, documenting clinical information in the health record

## 2019-09-24 NOTE — Patient Instructions (Signed)
  You will receive a phone call from [facility] to schedule and appointment or you may call their office - [phone]

## 2019-09-26 DIAGNOSIS — G5603 Carpal tunnel syndrome, bilateral upper limbs: Secondary | ICD-10-CM | POA: Diagnosis not present

## 2019-09-26 DIAGNOSIS — M65321 Trigger finger, right index finger: Secondary | ICD-10-CM | POA: Diagnosis not present

## 2019-09-28 DIAGNOSIS — G5623 Lesion of ulnar nerve, bilateral upper limbs: Secondary | ICD-10-CM | POA: Diagnosis not present

## 2019-09-28 DIAGNOSIS — G5603 Carpal tunnel syndrome, bilateral upper limbs: Secondary | ICD-10-CM | POA: Diagnosis not present

## 2019-10-14 ENCOUNTER — Ambulatory Visit (INDEPENDENT_AMBULATORY_CARE_PROVIDER_SITE_OTHER): Payer: Medicare Other | Admitting: *Deleted

## 2019-10-14 DIAGNOSIS — I639 Cerebral infarction, unspecified: Secondary | ICD-10-CM

## 2019-10-14 LAB — CUP PACEART REMOTE DEVICE CHECK
Date Time Interrogation Session: 20210620230446
Implantable Pulse Generator Implant Date: 20210211

## 2019-10-15 NOTE — Progress Notes (Signed)
[facility] Summary Report / Loop Recorder 

## 2019-10-23 DIAGNOSIS — G5602 Carpal tunnel syndrome, left upper limb: Secondary | ICD-10-CM | POA: Insufficient documentation

## 2019-11-01 ENCOUNTER — Other Ambulatory Visit: Payer: Medicare Other

## 2019-11-07 ENCOUNTER — Encounter: Payer: Self-pay | Admitting: Podiatry

## 2019-11-07 ENCOUNTER — Other Ambulatory Visit: Payer: Self-pay

## 2019-11-07 ENCOUNTER — Ambulatory Visit (INDEPENDENT_AMBULATORY_CARE_PROVIDER_SITE_OTHER): Payer: Medicare Other | Admitting: Podiatry

## 2019-11-07 DIAGNOSIS — B351 Tinea unguium: Secondary | ICD-10-CM | POA: Diagnosis not present

## 2019-11-07 DIAGNOSIS — E1142 Type 2 diabetes mellitus with diabetic polyneuropathy: Secondary | ICD-10-CM | POA: Diagnosis not present

## 2019-11-07 DIAGNOSIS — M21371 Foot drop, right foot: Secondary | ICD-10-CM | POA: Diagnosis not present

## 2019-11-07 DIAGNOSIS — M79674 Pain in right toe(s): Secondary | ICD-10-CM | POA: Diagnosis not present

## 2019-11-07 DIAGNOSIS — M79675 Pain in left toe(s): Secondary | ICD-10-CM | POA: Diagnosis not present

## 2019-11-07 NOTE — Progress Notes (Signed)
This patient returns to my office for at risk foot care.  This patient requires this care by a professional since this patient will be at risk due to having type 2 diabetes and CVA.  This patient is unable to cut nails himself since the patient cannot reach his nails.These nails are painful walking and wearing shoes.  This patient presents for at risk foot care today.  General Appearance  Alert, conversant and in no acute stress.  Vascular  Dorsalis pedis and posterior tibial  pulses are palpable  bilaterally.  Capillary return is within normal limits  bilaterally. Temperature is within normal limits  bilaterally.  Neurologic  Senn-Weinstein monofilament wire test diminished   bilaterally. Muscle power within normal limits bilaterally.  Nails Thick disfigured discolored nails with subungual debris  from hallux to fifth toes bilaterally. No evidence of bacterial infection or drainage bilaterally.  Orthopedic  No limitations of motion  feet .  No crepitus or effusions noted.  No bony pathology or digital deformities noted.  Skin  normotropic skin with no porokeratosis noted bilaterally.  No signs of infections or ulcers noted.   Asymptomatic heel callus  Onychomycosis  Pain in right toes  Pain in left toes  Consent was obtained for treatment procedures.   Mechanical debridement of nails 1-5  bilaterally performed with a nail nipper.  Filed with dremel without incident.    Return office visit    3 months                 Told patient to return for periodic foot care and evaluation due to potential at risk complications.   Unique Searfoss DPM  

## 2019-11-18 ENCOUNTER — Ambulatory Visit (INDEPENDENT_AMBULATORY_CARE_PROVIDER_SITE_OTHER): Payer: Medicare Other | Admitting: *Deleted

## 2019-11-18 DIAGNOSIS — I63413 Cerebral infarction due to embolism of bilateral middle cerebral arteries: Secondary | ICD-10-CM | POA: Diagnosis not present

## 2019-11-19 LAB — CUP PACEART REMOTE DEVICE CHECK
Date Time Interrogation Session: 20210725230521
Implantable Pulse Generator Implant Date: 20210211

## 2019-11-21 NOTE — Progress Notes (Signed)
[facility] Summary Report / Loop Recorder 

## 2019-11-30 ENCOUNTER — Ambulatory Visit
Admission: RE | Admit: 2019-11-30 | Discharge: 2019-11-30 | Disposition: A | Discharge status: Home / Self Care | Payer: Medicare Other | Source: Ambulatory Visit | Attending: Internal Medicine | Admitting: Internal Medicine

## 2019-11-30 ENCOUNTER — Other Ambulatory Visit: Payer: Self-pay

## 2019-11-30 DIAGNOSIS — R935 Abnormal findings on diagnostic imaging of other abdominal regions, including retroperitoneum: Secondary | ICD-10-CM

## 2019-11-30 DIAGNOSIS — Q453 Other congenital malformations of pancreas and pancreatic duct: Secondary | ICD-10-CM | POA: Diagnosis not present

## 2019-11-30 DIAGNOSIS — R7989 Other specified abnormal findings of blood chemistry: Secondary | ICD-10-CM

## 2019-11-30 DIAGNOSIS — K8020 Calculus of gallbladder without cholecystitis without obstruction: Secondary | ICD-10-CM | POA: Diagnosis not present

## 2019-11-30 MED ORDER — GADOBENATE DIMEGLUMINE 529 MG/ML IV SOLN
20.0000 mL | Freq: Once | INTRAVENOUS | Status: AC | PRN
  Administered 2019-11-30: 20 mL via INTRAVENOUS

## 2019-12-02 ENCOUNTER — Other Ambulatory Visit: Payer: Self-pay

## 2019-12-02 ENCOUNTER — Ambulatory Visit (HOSPITAL_COMMUNITY)
Admission: RE | Admit: 2019-12-02 | Discharge: 2019-12-02 | Disposition: A | Discharge status: Home / Self Care | Payer: Medicare Other | Source: Ambulatory Visit | Attending: Neurology | Admitting: Neurology

## 2019-12-02 DIAGNOSIS — J3489 Other specified disorders of nose and nasal sinuses: Secondary | ICD-10-CM | POA: Diagnosis not present

## 2019-12-02 DIAGNOSIS — G319 Degenerative disease of nervous system, unspecified: Secondary | ICD-10-CM | POA: Diagnosis not present

## 2019-12-02 DIAGNOSIS — I6381 Other cerebral infarction due to occlusion or stenosis of small artery: Secondary | ICD-10-CM | POA: Diagnosis not present

## 2019-12-02 DIAGNOSIS — M542 Cervicalgia: Secondary | ICD-10-CM | POA: Diagnosis not present

## 2019-12-02 DIAGNOSIS — I6782 Cerebral ischemia: Secondary | ICD-10-CM | POA: Diagnosis not present

## 2019-12-02 DIAGNOSIS — I63319 Cerebral infarction due to thrombosis of unspecified middle cerebral artery: Secondary | ICD-10-CM | POA: Insufficient documentation

## 2019-12-05 DIAGNOSIS — G894 Chronic pain syndrome: Secondary | ICD-10-CM | POA: Diagnosis not present

## 2019-12-13 NOTE — Progress Notes (Signed)
Kindly inform the patient that [place] stroke study protocol MRI scan of the brain shows expected changes in his recent strokes without any new or worrisome findings

## 2019-12-23 ENCOUNTER — Ambulatory Visit (INDEPENDENT_AMBULATORY_CARE_PROVIDER_SITE_OTHER): Payer: Medicare Other | Admitting: *Deleted

## 2019-12-23 DIAGNOSIS — I639 Cerebral infarction, unspecified: Secondary | ICD-10-CM

## 2019-12-24 LAB — CUP PACEART REMOTE DEVICE CHECK
Date Time Interrogation Session: 20210827230542
Implantable Pulse Generator Implant Date: 20210211

## 2019-12-25 NOTE — Progress Notes (Signed)
[facility] Summary Report / Loop Recorder 

## 2020-01-22 DIAGNOSIS — E1140 Type 2 diabetes mellitus with diabetic neuropathy, unspecified: Secondary | ICD-10-CM | POA: Diagnosis not present

## 2020-01-22 DIAGNOSIS — I10 Essential (primary) hypertension: Secondary | ICD-10-CM | POA: Diagnosis not present

## 2020-01-22 DIAGNOSIS — E1129 Type 2 diabetes mellitus with other diabetic kidney complication: Secondary | ICD-10-CM | POA: Diagnosis not present

## 2020-01-22 DIAGNOSIS — G4733 Obstructive sleep apnea (adult) (pediatric): Secondary | ICD-10-CM | POA: Diagnosis not present

## 2020-01-22 DIAGNOSIS — E785 Hyperlipidemia, unspecified: Secondary | ICD-10-CM | POA: Diagnosis not present

## 2020-01-22 DIAGNOSIS — R748 Abnormal levels of other serum enzymes: Secondary | ICD-10-CM | POA: Diagnosis not present

## 2020-01-27 ENCOUNTER — Ambulatory Visit (INDEPENDENT_AMBULATORY_CARE_PROVIDER_SITE_OTHER): Payer: Medicare Other

## 2020-01-27 DIAGNOSIS — I639 Cerebral infarction, unspecified: Secondary | ICD-10-CM

## 2020-01-27 LAB — CUP PACEART REMOTE DEVICE CHECK
Date Time Interrogation Session: 20210929230428
Implantable Pulse Generator Implant Date: 20210211

## 2020-01-29 NOTE — Progress Notes (Signed)
[facility] Summary Report / Loop Recorder 

## 2020-02-05 DIAGNOSIS — Z23 Encounter for immunization: Secondary | ICD-10-CM | POA: Diagnosis not present

## 2020-02-13 ENCOUNTER — Ambulatory Visit (INDEPENDENT_AMBULATORY_CARE_PROVIDER_SITE_OTHER): Payer: Medicare Other | Admitting: Podiatry

## 2020-02-13 ENCOUNTER — Encounter: Payer: Self-pay | Admitting: Podiatry

## 2020-02-13 ENCOUNTER — Other Ambulatory Visit: Payer: Self-pay

## 2020-02-13 DIAGNOSIS — E1142 Type 2 diabetes mellitus with diabetic polyneuropathy: Secondary | ICD-10-CM | POA: Diagnosis not present

## 2020-02-13 DIAGNOSIS — M79674 Pain in right toe(s): Secondary | ICD-10-CM

## 2020-02-13 DIAGNOSIS — M79675 Pain in left toe(s): Secondary | ICD-10-CM | POA: Diagnosis not present

## 2020-02-13 DIAGNOSIS — B351 Tinea unguium: Secondary | ICD-10-CM

## 2020-02-13 NOTE — Progress Notes (Signed)
This patient returns to my office for at risk foot care.  This patient requires this care by a professional since this patient will be at risk due to having type 2 diabetes and CVA.  This patient is unable to cut nails himself since the patient cannot reach his nails.These nails are painful walking and wearing shoes.  This patient presents for at risk foot care today.  General Appearance  Alert, conversant and in no acute stress.  Vascular  Dorsalis pedis and posterior tibial  pulses are palpable  bilaterally.  Capillary return is within normal limits  bilaterally. Temperature is within normal limits  bilaterally.  Neurologic  Senn-Weinstein monofilament wire test diminished   bilaterally. Muscle power within normal limits bilaterally.  Nails Thick disfigured discolored nails with subungual debris  from hallux to fifth toes bilaterally. No evidence of bacterial infection or drainage bilaterally.  Orthopedic  No limitations of motion  feet .  No crepitus or effusions noted.  No bony pathology or digital deformities noted.  Skin  normotropic skin with no porokeratosis noted bilaterally.  No signs of infections or ulcers noted.   Asymptomatic heel callus  Onychomycosis  Pain in right toes  Pain in left toes  Consent was obtained for treatment procedures.   Mechanical debridement of nails 1-5  bilaterally performed with a nail nipper.  Filed with dremel without incident.    Return office visit    3 months                 Told patient to return for periodic foot care and evaluation due to potential at risk complications.   Gardiner Barefoot DPM

## 2020-02-28 LAB — CUP PACEART REMOTE DEVICE CHECK
Date Time Interrogation Session: 20211101230344
Implantable Pulse Generator Implant Date: 20210211

## 2020-02-29 DIAGNOSIS — Z23 Encounter for immunization: Secondary | ICD-10-CM | POA: Diagnosis not present

## 2020-03-02 ENCOUNTER — Ambulatory Visit (INDEPENDENT_AMBULATORY_CARE_PROVIDER_SITE_OTHER): Payer: Medicare Other

## 2020-03-02 DIAGNOSIS — I639 Cerebral infarction, unspecified: Secondary | ICD-10-CM

## 2020-03-03 NOTE — Progress Notes (Signed)
[facility] Summary Report / Loop Recorder 

## 2020-03-27 DIAGNOSIS — M961 Postlaminectomy syndrome, not elsewhere classified: Secondary | ICD-10-CM | POA: Diagnosis not present

## 2020-03-27 DIAGNOSIS — Z79899 Other long term (current) drug therapy: Secondary | ICD-10-CM | POA: Diagnosis not present

## 2020-03-27 DIAGNOSIS — Z5181 Encounter for therapeutic drug level monitoring: Secondary | ICD-10-CM | POA: Diagnosis not present

## 2020-03-27 DIAGNOSIS — G894 Chronic pain syndrome: Secondary | ICD-10-CM | POA: Diagnosis not present

## 2020-03-27 DIAGNOSIS — M5136 Other intervertebral disc degeneration, lumbar region: Secondary | ICD-10-CM | POA: Diagnosis not present

## 2020-04-05 LAB — CUP PACEART REMOTE DEVICE CHECK
Date Time Interrogation Session: 20211204230329
Implantable Pulse Generator Implant Date: 20210211

## 2020-04-06 ENCOUNTER — Ambulatory Visit (INDEPENDENT_AMBULATORY_CARE_PROVIDER_SITE_OTHER): Payer: Medicare Other

## 2020-04-06 DIAGNOSIS — I639 Cerebral infarction, unspecified: Secondary | ICD-10-CM

## 2020-04-21 NOTE — Progress Notes (Signed)
[facility] Summary Report / Loop Recorder 

## 2020-05-11 ENCOUNTER — Ambulatory Visit (INDEPENDENT_AMBULATORY_CARE_PROVIDER_SITE_OTHER): Payer: Medicare Other

## 2020-05-11 DIAGNOSIS — I639 Cerebral infarction, unspecified: Secondary | ICD-10-CM

## 2020-05-13 LAB — CUP PACEART REMOTE DEVICE CHECK
Date Time Interrogation Session: 20220115230508
Implantable Pulse Generator Implant Date: 20210211

## 2020-05-14 ENCOUNTER — Ambulatory Visit: Payer: Medicare Other | Admitting: Neurology

## 2020-05-19 DIAGNOSIS — E119 Type 2 diabetes mellitus without complications: Secondary | ICD-10-CM | POA: Diagnosis not present

## 2020-05-19 DIAGNOSIS — H2513 Age-related nuclear cataract, bilateral: Secondary | ICD-10-CM | POA: Diagnosis not present

## 2020-05-19 DIAGNOSIS — H25013 Cortical age-related cataract, bilateral: Secondary | ICD-10-CM | POA: Diagnosis not present

## 2020-05-19 DIAGNOSIS — H40013 Open angle with borderline findings, low risk, bilateral: Secondary | ICD-10-CM | POA: Diagnosis not present

## 2020-05-21 ENCOUNTER — Ambulatory Visit: Payer: Medicare Other | Admitting: Podiatry

## 2020-05-25 ENCOUNTER — Encounter: Payer: Self-pay | Admitting: Podiatry

## 2020-05-25 ENCOUNTER — Ambulatory Visit (INDEPENDENT_AMBULATORY_CARE_PROVIDER_SITE_OTHER): Payer: Medicare Other | Admitting: Podiatry

## 2020-05-25 ENCOUNTER — Other Ambulatory Visit: Payer: Self-pay

## 2020-05-25 DIAGNOSIS — L84 Corns and callosities: Secondary | ICD-10-CM | POA: Diagnosis not present

## 2020-05-25 DIAGNOSIS — E1142 Type 2 diabetes mellitus with diabetic polyneuropathy: Secondary | ICD-10-CM

## 2020-05-25 DIAGNOSIS — B351 Tinea unguium: Secondary | ICD-10-CM

## 2020-05-25 DIAGNOSIS — M21371 Foot drop, right foot: Secondary | ICD-10-CM | POA: Diagnosis not present

## 2020-05-25 DIAGNOSIS — M79675 Pain in left toe(s): Secondary | ICD-10-CM

## 2020-05-25 DIAGNOSIS — M79674 Pain in right toe(s): Secondary | ICD-10-CM | POA: Diagnosis not present

## 2020-05-25 NOTE — Progress Notes (Signed)
This patient returns to my office for at risk foot care.  This patient requires this care by a professional since this patient will be at risk due to having type 2 diabetes and CVA.  This patient is unable to cut nails himself since the patient cannot reach his nails.These nails are painful walking and wearing shoes.  This patient presents for at risk foot care today.  General Appearance  Alert, conversant and in no acute stress.  Vascular  Dorsalis pedis and posterior tibial  pulses are palpable  bilaterally.  Capillary return is within normal limits  bilaterally. Temperature is within normal limits  bilaterally.  Neurologic  Senn-Weinstein monofilament wire test diminished   bilaterally. Muscle power within normal limits bilaterally.  Nails Thick disfigured discolored nails with subungual debris  from hallux to fifth toes bilaterally. No evidence of bacterial infection or drainage bilaterally.  Orthopedic  No limitations of motion  feet .  No crepitus or effusions noted.  No bony pathology or digital deformities noted.  Skin  normotropic skin with no porokeratosis noted bilaterally.  No signs of infections or ulcers noted.   Asymptomatic heel callus  Onychomycosis  Pain in right toes  Pain in left toes  Consent was obtained for treatment procedures.   Mechanical debridement of nails 1-5  bilaterally performed with a nail nipper.  Filed with dremel without incident.   Filed heel callus with dremel.   Return office visit    3 months                 Told patient to return for periodic foot care and evaluation due to potential at risk complications.   Dwan Hemmelgarn DPM  

## 2020-05-26 NOTE — Progress Notes (Signed)
[facility] Summary Report / Loop Recorder 

## 2020-06-03 ENCOUNTER — Other Ambulatory Visit: Payer: Self-pay

## 2020-06-03 DIAGNOSIS — R7989 Other specified abnormal findings of blood chemistry: Secondary | ICD-10-CM

## 2020-06-12 ENCOUNTER — Ambulatory Visit (INDEPENDENT_AMBULATORY_CARE_PROVIDER_SITE_OTHER): Payer: Medicare Other

## 2020-06-12 DIAGNOSIS — I63413 Cerebral infarction due to embolism of bilateral middle cerebral arteries: Secondary | ICD-10-CM | POA: Diagnosis not present

## 2020-06-12 LAB — CUP PACEART REMOTE DEVICE CHECK
Date Time Interrogation Session: 20220217230720
Implantable Pulse Generator Implant Date: 20210211

## 2020-06-16 NOTE — Progress Notes (Signed)
[facility] Summary Report / Loop Recorder 

## 2020-06-29 ENCOUNTER — Other Ambulatory Visit (INDEPENDENT_AMBULATORY_CARE_PROVIDER_SITE_OTHER): Payer: Medicare Other

## 2020-06-29 DIAGNOSIS — H25013 Cortical age-related cataract, bilateral: Secondary | ICD-10-CM | POA: Diagnosis not present

## 2020-06-29 DIAGNOSIS — H25041 Posterior subcapsular polar age-related cataract, right eye: Secondary | ICD-10-CM | POA: Diagnosis not present

## 2020-06-29 DIAGNOSIS — R7989 Other specified abnormal findings of blood chemistry: Secondary | ICD-10-CM | POA: Diagnosis not present

## 2020-06-29 DIAGNOSIS — H2513 Age-related nuclear cataract, bilateral: Secondary | ICD-10-CM | POA: Diagnosis not present

## 2020-06-29 DIAGNOSIS — H2511 Age-related nuclear cataract, right eye: Secondary | ICD-10-CM | POA: Diagnosis not present

## 2020-06-29 LAB — HEPATIC FUNCTION PANEL
ALT: 22 U/L (ref 0–53)
AST: 32 U/L (ref 0–37)
Albumin: 3.9 g/dL (ref 3.5–5.2)
Alkaline Phosphatase: 211 U/L — ABNORMAL HIGH (ref 39–117)
Bilirubin, Direct: 0.1 mg/dL (ref 0.0–0.3)
Total Bilirubin: 0.5 mg/dL (ref 0.2–1.2)
Total Protein: 8 g/dL (ref 6.0–8.3)

## 2020-07-01 ENCOUNTER — Other Ambulatory Visit: Payer: Self-pay

## 2020-07-01 DIAGNOSIS — R7989 Other specified abnormal findings of blood chemistry: Secondary | ICD-10-CM

## 2020-07-02 DIAGNOSIS — Z125 Encounter for screening for malignant neoplasm of prostate: Secondary | ICD-10-CM | POA: Diagnosis not present

## 2020-07-02 DIAGNOSIS — E1129 Type 2 diabetes mellitus with other diabetic kidney complication: Secondary | ICD-10-CM | POA: Diagnosis not present

## 2020-07-02 DIAGNOSIS — E785 Hyperlipidemia, unspecified: Secondary | ICD-10-CM | POA: Diagnosis not present

## 2020-07-07 DIAGNOSIS — M2550 Pain in unspecified joint: Secondary | ICD-10-CM | POA: Diagnosis not present

## 2020-07-07 DIAGNOSIS — Z6833 Body mass index (BMI) 33.0-33.9, adult: Secondary | ICD-10-CM | POA: Diagnosis not present

## 2020-07-07 DIAGNOSIS — E669 Obesity, unspecified: Secondary | ICD-10-CM | POA: Diagnosis not present

## 2020-07-09 DIAGNOSIS — Z1389 Encounter for screening for other disorder: Secondary | ICD-10-CM | POA: Diagnosis not present

## 2020-07-09 DIAGNOSIS — E1129 Type 2 diabetes mellitus with other diabetic kidney complication: Secondary | ICD-10-CM | POA: Diagnosis not present

## 2020-07-09 DIAGNOSIS — Z23 Encounter for immunization: Secondary | ICD-10-CM | POA: Diagnosis not present

## 2020-07-09 DIAGNOSIS — R2689 Other abnormalities of gait and mobility: Secondary | ICD-10-CM | POA: Diagnosis not present

## 2020-07-09 DIAGNOSIS — E1140 Type 2 diabetes mellitus with diabetic neuropathy, unspecified: Secondary | ICD-10-CM | POA: Diagnosis not present

## 2020-07-09 DIAGNOSIS — D649 Anemia, unspecified: Secondary | ICD-10-CM | POA: Diagnosis not present

## 2020-07-09 DIAGNOSIS — M4712 Other spondylosis with myelopathy, cervical region: Secondary | ICD-10-CM | POA: Diagnosis not present

## 2020-07-09 DIAGNOSIS — G629 Polyneuropathy, unspecified: Secondary | ICD-10-CM | POA: Diagnosis not present

## 2020-07-09 DIAGNOSIS — Z1212 Encounter for screening for malignant neoplasm of rectum: Secondary | ICD-10-CM | POA: Diagnosis not present

## 2020-07-09 DIAGNOSIS — E785 Hyperlipidemia, unspecified: Secondary | ICD-10-CM | POA: Diagnosis not present

## 2020-07-09 DIAGNOSIS — I10 Essential (primary) hypertension: Secondary | ICD-10-CM | POA: Diagnosis not present

## 2020-07-09 DIAGNOSIS — G8191 Hemiplegia, unspecified affecting right dominant side: Secondary | ICD-10-CM | POA: Diagnosis not present

## 2020-07-09 DIAGNOSIS — Z0000 Encounter for general adult medical examination without abnormal findings: Secondary | ICD-10-CM | POA: Diagnosis not present

## 2020-07-09 DIAGNOSIS — R202 Paresthesia of skin: Secondary | ICD-10-CM | POA: Diagnosis not present

## 2020-07-09 DIAGNOSIS — Z1331 Encounter for screening for depression: Secondary | ICD-10-CM | POA: Diagnosis not present

## 2020-07-09 DIAGNOSIS — R82998 Other abnormal findings in urine: Secondary | ICD-10-CM | POA: Diagnosis not present

## 2020-07-09 DIAGNOSIS — I77811 Abdominal aortic ectasia: Secondary | ICD-10-CM | POA: Diagnosis not present

## 2020-07-15 ENCOUNTER — Ambulatory Visit (INDEPENDENT_AMBULATORY_CARE_PROVIDER_SITE_OTHER): Payer: Medicare Other

## 2020-07-15 DIAGNOSIS — I639 Cerebral infarction, unspecified: Secondary | ICD-10-CM | POA: Diagnosis not present

## 2020-07-15 DIAGNOSIS — H2511 Age-related nuclear cataract, right eye: Secondary | ICD-10-CM | POA: Diagnosis not present

## 2020-07-15 DIAGNOSIS — H25811 Combined forms of age-related cataract, right eye: Secondary | ICD-10-CM | POA: Diagnosis not present

## 2020-07-15 LAB — CUP PACEART REMOTE DEVICE CHECK
Date Time Interrogation Session: 20220322230251
Implantable Pulse Generator Implant Date: 20210211

## 2020-07-20 ENCOUNTER — Ambulatory Visit (INDEPENDENT_AMBULATORY_CARE_PROVIDER_SITE_OTHER): Payer: Medicare Other | Admitting: Neurology

## 2020-07-20 ENCOUNTER — Other Ambulatory Visit: Payer: Self-pay

## 2020-07-20 ENCOUNTER — Encounter: Payer: Self-pay | Admitting: Neurology

## 2020-07-20 VITALS — BP 149/94 | HR 73 | Ht 75.0 in | Wt 264.0 lb

## 2020-07-20 DIAGNOSIS — G4733 Obstructive sleep apnea (adult) (pediatric): Secondary | ICD-10-CM | POA: Diagnosis not present

## 2020-07-20 DIAGNOSIS — Z9989 Dependence on other enabling machines and devices: Secondary | ICD-10-CM | POA: Diagnosis not present

## 2020-07-20 DIAGNOSIS — I63413 Cerebral infarction due to embolism of bilateral middle cerebral arteries: Secondary | ICD-10-CM

## 2020-07-20 DIAGNOSIS — M21371 Foot drop, right foot: Secondary | ICD-10-CM | POA: Diagnosis not present

## 2020-07-20 DIAGNOSIS — Z8673 Personal history of transient ischemic attack (TIA), and cerebral infarction without residual deficits: Secondary | ICD-10-CM

## 2020-07-20 DIAGNOSIS — M24549 Contracture, unspecified hand: Secondary | ICD-10-CM | POA: Diagnosis not present

## 2020-07-20 NOTE — Progress Notes (Signed)
Subjective:    Patient ID: Rodney Crane is a 70 y.o. male.  HPI     Interim history:  Rodney Crane is a 70 year old right-handed gentleman with an underlying medical history of hypertension, smoking, hyperlipidemia, type 2 diabetes, degenerative back disease, s/p 7 back surgeries, stroke (left thalamic), who presents for follow-up consultation of his stroke and sleep apnea, on CPAP. The patient is accompanied by his wife today. I last saw him on 07/10/2019, at which time we talked about his stroke and hospitalization for this in Feb. 2021. He was compliant with his CPAP. He had a loop recorder placed. MRI brain showed subacute frontal lobe MCA infarcts, embolic secondary to unknown source.  Echo with an EF of 65 to 70%, no source of embolus, LDL 56, lower extremity Doppler with no DVT.  CTA of the head and neck showed no LVO.  Mild left ICA bifurcation. A1c was 7.4. He was started on Plavix. He was on a stroke study drug, enrolled in a stroke research study. He has elevated liver enzymes, he had an US abdomen, which showed cholelithiasis and gallbladder polyps.    Today, 07/20/2020: I reviewed his CPAP compliance data from 06/16/2020 through 07/15/2020, which is a total of 30 days, during which time he used his machine every night with percent use days greater than 4 hours at 100%, indicating superb compliance with an average usage of 8 hours and 42 minutes, residual AHI at goal at 1.1/h, leak on the higher side with a 95th percentile at 18 L/min on a pressure of 7 cm with EPR of 2.  He reports doing well with his CPAP.  He had no recent strokelike symptoms thankfully.  He did have about 2 falls in the past month, one time he fell on his driveway as he was walking to talk to his landscaper.  He braced his fall but did get some skin breakdown.  He fell about a week later at the grocery store when he was entering his car and may have just lost his footing, he fell backwards but again braced his fall by  falling sideways, he bumped his right hip and lower back.  He had bruising but did not hurt himself any more seriously, did not hit his head, no loss of consciousness.  He had cataract surgery less than a week ago and is still using a protective guard for the eye and using eyedrops.  He has noticed in the past few weeks or months a gradual contracture in his middle fingers and ring fingers, right more than left.  He has difficulty extending his fingers completely.  He has seen different doctors for this including Dr. Amil Amen and rheumatology.  He has seen his orthopedic doctors.  He had EMG and nerve conduction velocity testing under Dr. Nelva Bush, apparently there was some evidence of carpal tunnel. He was offered a referral to OT, but has previously had occupational therapy and still has the exercises he was given at the time of his stroke, including an exercise ball for the hand strength, also putty to improve dexterity, he did not pursue a formal referral to OT. He has an AFO for the R leg, but feels, he needs a more rigid kind that prevents his right foot from rolling.  He has a tendency to point it out as well. Both he and his wife are very pleased with her outcome after gamma knife surgery (she is a patient of mine as well).  The patient's allergies, current medications,  family history, past medical history, past social history, past surgical history and problem list were reviewed and updated as appropriate.    Previously (copied from previous notes for reference):    I saw him on 05/09/2019, at which time he was compliant with his CPAP.    I reviewed compliance data from 2/14 to 07/08/19. He reports feeling well, at baseline, loop recorder bothers him some at times.    I saw him on 05/07/2018, at which time he was compliant with his CPAP.  I wrote for a new machine.  He felt stable from the neurological and stroke standpoint.  He was using a cane, he was still using an AFO on the right.    I reviewed  his CPAP compliance data from 04/08/2019 through 05/07/2019 which is a total of 30 days, during which time he used his machine every night with percent use days greater than 4 hours at 100%, indicating excellent compliance with an average usage of 8 hours and 20 minutes, residual AHI at goal at 0.9/h, leak acceptable with a 95th percentile at 10.2 L/min on a pressure of 7 cm with EPR of 2.  Set up date was July of last year.      I saw him on 05/01/2017, at which time he was compliant with his CPAP. I increased his pressure at the time because of residual sleep-disordered breathing noted. From the neurological standpoint he was stable. He was advised to follow-up in one year.   I reviewed his CPAP compliance data from 04/08/2018 through 05/07/2018 which is a total of 30 days, during which time he used his machine 29 days with percent used days greater than 4 hours at 97%, indicating excellent compliance with an average usage of 8 hours and 20 minutes, residual AHI borderline at 6.6 per hour, leak acceptable with the 95th percentile at 8.1 L/m on a pressure of 7 cm with EPR of 3.      Of note, he presented to the emergency room on 08/28/2016 with right thigh pain and swelling. He had no neurological new focal findings. He has a chronic foot drop. He had an interim hospital admission for central cord syndrome and had cervical spine surgery in March 2018. He had an EMG and nerve conduction test in February 2018 which I reviewed: Conclusion: There is electrodiagnostic evidence of chronic length-dependent sensorimotor axonal neuropathy. No suggestion of myopathy/myositis. 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 The Tibialis Anterior showed acute/ongoing denervation which could be due to radiculopathy or neuropathy. I would have expected more demyelinating-rage changes by now on NCV if the etiology of his weakness were due to demyelinating disease (such  as Acute Inflammatory Demyelinating Polyneuropathy). Given history of worsening weakness after fall and neck injury, likely severe cervical stenosis as cause for his symptoms.      I saw him on 04/27/2016, at which time he reported difficulty using his CPAP because of flare up of his back condition. He has had multiple back surgeries and was not able to sleep in his bed. He was using his pain medication sparingly. I encouraged him to try to use CPAP as much is possible. He had no new strokelike symptoms in the interim.   I reviewed his CPAP compliance data from 04/01/2017 through 04/30/2017 which is a total of 30 days, during which time he used his machine every night with percent used days greater than 4 hours at 100%, indicating superb compliance.  Average usage of 8 hours and 5 minutes, which is great, residual AHI suboptimal at 10.4 per hour, mostly secondary to obstructive events, leak on the lower end with the 95th percentile at 7.6 L/m on a pressure of 7 cm with EPR of 3.  I saw him on 04/23/2015, at which time he reported doing well.he had a recent MRI with contrast on 01/01/15. He had seen Dr. Einar Gip in cardiology and a stress test, which was fine, per patient. His brain MRI w contrast did show evidence of an evolving L thalamic infarct. I had suggested a cardiology referral because of chest pains reported.    I reviewed his CPAP compliance data from 03/20/2016 through 04/18/2016, which is a total of 30 days, during which time he used his machine only 8 days. His last 90 day compliance showed full compliance until 03/25/2016.   I saw him on 12/22/2014 at which time he reported a recent history of left facial numbness and tingling and subjective feeling of tongue swelling. On examination he had decreased sensation of his left midface and very slight right grip strength weakness. On MRI brain he had evidence of a recent left thalamic stroke. We talked about stroke prevention, he was advised to continue  with Plavix and I suggested a repeat brain MRI with contrast as well as a stroke consult with Dr. Erlinda Hong. I also suggested a cardiology referral due to chest pains reported.   I reviewed his CPAP compliance data from 03/22/15 through 04/20/15, which is a total of 30 days during which time the patient used CPAP every night, with percent used days greater than 4 hours at 100%, indicating superb compliance with an average usage of 7 hours and 6 minutes, residual AHI low at 1 per hour, leak low with the 95th percentile at 5.5 L/m on a pressure of 7 cm with EPR of 3.   I saw him on 01/15/2014 for sleep apnea follow-up at which time the patient reported doing well with CPAP and he was using it and was fully compliant with treatment. He reported that he had residual back pain and may need a spinal cord stimulator. He reported a 20 pound weight loss overall. I suggested a one-year follow-up.   He had a brain MRI without contrast on 12/04/2014: Punctate subacute infarction in the left thalamus. Mild leukoarosis, unchanged. This is most likely due to chronic microvascular disease. Increased signal intensity in the pons that may represent a demyelinating process versus chronic microvascular disease, unchanged. He had a carotid Doppler study at [facility] on 12/10/2014 with impression: Minimal bilateral atherosclerosis without flow limiting stenosis, less than 50% stenosis bilaterally.   He reports that he started having some symptoms about a month ago. He started having numbness and tingling in his left face and his tongue felt swollen. He had changes in his taste and report some loss of appetite. He has lost weight. He had some grip strength weakness on the right which his wife noted that he tried to downplay it at the time. She became really concerned that she also noted slurring of speech intermittently, especially when he was tired and fatigued. He reports significant fatigue for the past month or 2.  He has a history of foot drop and low back pain and these symptoms seem unchanged. Since his MRI earlier this month he was told to stop his baby aspirin and start Plavix. He has been taking Plavix regularly since then. He does admit that he was not  always fully compliant with his baby aspirin. He had some intermittent chest pain. He did not bring this up necessarily at his doctor's appointment. He tries to work out regularly. He has been exercising at the gym 3 to even 4 times a week. He has not seen a cardiologist in years. He does admit to smoking some cigarettes. His wife provides additional information. She states that he tries to drink water and avoid sodas. He has improved in that regard. He still may not drink enough water. He is fully compliant with CPAP therapy. He had recent supplies delivered.   He had an MRI lumbar spine with and without contrast through Dr. Susa Day on 10/18/2006 (Indication: Left-sided back pain radiating to both legs. Weakness and numbness on the left side, ongoing for 60 months. History per lumbar surgery): 1. Multilevel central, foraminal, and subarticular lateral recess stenoses as detailed above, with the most striking findings at the L3-L4 level. 2. There is a suggestion of dural enhancement and potentially peripheral nerve root clumping in the lower portion of the thecal sac, suspicious for mild arachnoiditis.   I reviewed his CPAP compliance data from 11/21/2014 through 12/20/2014 which is a total of 30 days during which time he used his machine every night with percent used days greater than 4 hours at 100%, indicating superb compliance with an average usage of 7 hours and 16 minutes, residual AHI at 0.8 per hour, leaked low with the 95th percentile at 3.5 L/m on a pressure of 7 cm with EPR of 3.   I saw him on 07/15/2013, at which time we talked about his sleep study results as well as his compliance data. He reported having adjusted well to CPAP, but he had  nasal congestion and some nasal dryness. He felt better and his wife endorsed that he slept better.   I reviewed his compliance data from 12/14/2013 through 01/12/2014 which is a total of 30 days during which time he used CPAP every night. Percent used days greater than 4 hours was 100%, indicating superb compliance. Average usage was 8 hours and 12 minutes, residual AHI low at 1.2 per hour and leak very low at 1.2 L per minute at the 95th percentile. Pressure at 7 cm with EPR of 3.     I first met him on 04/12/2013, at which time he complained of snoring and excessive daytime somnolence. I suggested he have a sleep study. He had a split-night sleep study on 05/28/2013 and went over his test results with him in detail today. His baseline sleep efficiency was reduced at 68.1% with a latency to sleep of 33 minutes and wake after sleep onset of 46.5 minutes with moderate to severe sleep fragmentation noted. His arousal index was mildly elevated. He had an increased percentage of stage I and stage II sleep, absence of slow-wave sleep, and a reduced percentage of REM sleep with a mildly prolonged REM latency. He had no significant EKG changes or periodic leg movements of sleep. He had moderate to loud snoring. He had a total AHI of 15.2 per hour, rising to 38 per hour in REM sleep. His baseline oxygen saturation was 95% with a nadir of 78% during REM sleep. He was then titrated on CPAP. His sleep efficiency improved. His arousal index improved. His sleep architecture showed an increased percentage of stage II sleep, absence of slow-wave sleep and a reduced percentage of REM sleep. Average oxygen saturation was 96%, nadir was 92%, greatly improved. He was titrated  on CPAP from 5-7 cm with a reduction of his AHI to 0 per hour on the final pressure. He had REM sleep but no supine sleep was achieved during this part of the study. He indicated that he does not sleep on his back. He is status post multiple back  operations. Based on the test results I prescribed CPAP for him.   I reviewed the patient's CPAP compliance data from 06/09/2013 to 07/08/2013, which is a total of 30 days, during which time the patient used CPAP every day except for 1 day. The average usage for all days was 7 hours and 44 minutes. The percent used days greater than 4 hours was 97 %, indicating excellent compliance. The residual AHI was 1.3 per hour, indicating an appropriate treatment pressure of 7 cwp with EPR of 3.     I reviewed his compliance data of with his machine: 06/10/2013 through 07/14/2013 which is a total of 35 days during which time use CPAP every night with a percent used days greater than 4 hours of 100%, indicating excellent compliance, average usage was 7 hours and 47 minutes, residual AHI low at 1.3 per hour and leak was low at 1.4. Pressure was 7 cm with EPR of 3.  His Past Medical History Is Significant For: Past Medical History:  Diagnosis Date  . Abdominal aortic ectasia ([place])   . Arthritis   . Cervical spondylosis   . Colon polyps   . CVA (cerebral infarction) 2010  . Diabetes mellitus   . Dizziness   . DVT (deep venous thrombosis) ([facility])    bilateral  . ED (erectile dysfunction)   . Foot drop   . H/O Bell's palsy   . Hyperlipidemia   . Hypertension   . Obesity   . OSA on CPAP   . Raynauds syndrome   . Restrictive lung disease   . Spinal stenosis   . Stroke ([facility])   . Unsteady gait     His Past Surgical History Is Significant For: Past Surgical History:  Procedure Laterality Date  . CARPAL TUNNEL RELEASE Bilateral   . CERVICAL DISCECTOMY  2004  . LOOP RECORDER INSERTION N/A 06/06/2019   Procedure: LOOP RECORDER INSERTION;  Surgeon: [name], MD;  Location: [facility];  Service: Cardiovascular;  Laterality: N/A;  . LUMBAR DISC SURGERY     x6  . POSTERIOR CERVICAL FUSION/FORAMINOTOMY N/A 06/27/2016   Procedure: POSTERIOR CERVICAL FUSION C2-C5; C2-C4 Decompression;  Surgeon:  Melina Schools, MD;  Location: [facility];  Service: Orthopedics;  Laterality: N/A;    His Family History Is Significant For: Family History  Problem Relation Age of Onset  . Multiple sclerosis Sister   . Dementia Mother   . Diabetes Mother   . Lung cancer Father   . Sleep apnea Brother   . Hypertension Brother   . Colon cancer Neg Hx   . Stomach cancer Neg Hx     His Social History Is Significant For: Social History   Socioeconomic History  . Marital status: Married    Spouse name: Nicki Reaper  . Number of children: 2  . Years of education: 54  . Highest education level: Not on file  Occupational History  . Occupation: RETIRED    Comment: retired  Tobacco Use  . Smoking status: Former Smoker    Types: Cigarettes    Quit date: 01/21/2011    Years since quitting: 9.5  . Smokeless tobacco: Never Used  . Tobacco comment: still smokes occasional  cigars  Vaping Use  . Vaping Use: Never used  Substance and Sexual Activity  . Alcohol use: No  . Drug use: No  . Sexual activity: Not on file  Other Topics Concern  . Not on file  Social History Narrative   Patient is right handed and resides with wife   Social Determinants of Health   Financial Resource Strain: Not on file  Food Insecurity: Not on file  Transportation Needs: Not on file  Physical Activity: Not on file  Stress: Not on file  Social Connections: Not on file    His Allergies Are:  No Known Allergies:   His Current Medications Are:  Outpatient Encounter Medications as of 07/20/2020  Medication Sig  . ACCU-CHEK GUIDE test strip USE TO TEST BLOOD SUGAR DAILY  . atorvastatin (LIPITOR) 40 MG tablet Take 0.5 tablets (20 mg total) by mouth at bedtime. (Patient taking differently: Take 40 mg by mouth at bedtime.)  . blood glucose meter kit and supplies KIT Dispense based on patient and insurance preference. Use up to four times daily as directed. (FOR ICD-9 250.00, 250.01).  Marland Kitchen clopidogrel (PLAVIX) 75 MG tablet Take  75 mg by mouth daily.   . cyclobenzaprine (FLEXERIL) 10 MG tablet Take 1 tablet (10 mg total) by mouth every 8 (eight) hours as needed for muscle spasms.  . meclizine (ANTIVERT) 25 MG tablet Take 25 mg by mouth 3 (three) times daily as needed for dizziness.  . metFORMIN (GLUCOPHAGE) 1000 MG tablet Take 1,000 mg by mouth 2 (two) times daily with a meal.  . oxyCODONE-acetaminophen (PERCOCET/ROXICET) 5-325 MG tablet Take 1 tablet by mouth 3 (three) times daily as needed.  . pantoprazole (PROTONIX) 40 MG tablet Take 1 tablet (40 mg total) by mouth daily.  . polyethylene glycol (MIRALAX / GLYCOLAX) packet Take 17 g by mouth 2 (two) times daily. (Patient taking differently: Take 17 g by mouth daily.)  . sildenafil (REVATIO) 20 MG tablet Take 20-100 mg by mouth as needed (for erectile dysfunction).   . sildenafil (VIAGRA) 100 MG tablet sildenafil 100 mg tablet  TAKE 1 TABLET BY MOUTH ONCE DAILY AS NEEDED  . valsartan-hydrochlorothiazide (DIOVAN-HCT) 160-12.5 MG tablet Take 1 tablet by mouth daily.  . [DISCONTINUED] Investigational - Study Medication Study name: [place] stroke Drug: BAY [phone] (Green bottle) 5, 15, 25 mg or placebo tablet Take one tablet from blue bottle and one tablet from green bottle by mouth daily. Additional study details:  . [DISCONTINUED] Study - PACIFIC (STROKE) - BAY [id number] (Blue Bottle) 5, 15, 25 mg or placebo tablet (PI-Sethi) Take 1 tablet by mouth daily. For Investigational Use Only. Take with 1 tablet from Green Bottle for a total of 10, 20, 50 mg or placebo daily. Swallow whole with a glass of water; do not crush or chew. PACIFIC (STROKE) STUDY MEDICATION - BAY [id number] (Blue Bottle) 5, 15, 25 mg or placebo tablet (PI-Sethi)   No facility-administered encounter medications on file as of 07/20/2020.  :  Review of Systems:  Out of a complete 14 point review of systems, all are reviewed and negative with the exception of these symptoms as listed below:  Review of Systems   Neurological:       Here for 1 year f/u. Reports CPAP is going well. He had cataract surgery 1 week wed and had an underlying tear repair. Reports he has had trouble with the index and middle fingers swelling in the bilateral hands.     Objective:  Neurological Exam  Physical Exam Physical Examination:   Vitals:   07/20/20 1029  BP: (!) 149/94  Pulse: 73    General Examination: The patient is a very pleasant 70 y.o. male in no acute distress. He appears well-developed and well-nourished and well groomed.   HEENT:Normocephalic, atraumatic, L pupil is round and reactive to light, right eye has protective plastic cover, s/p recent surgery. Corrective eyeglasses in place.Hearing is grossly intact. Face is symmetric with normal facial animation. Speech is clear,no dysarthria noted and no hypophonia. There is no lip, neck/head, jaw or voice tremor. Neckwith mildly decreased ROM.  Surgical scar posteriorly. Oropharynx exam reveals: no significant changes, butmild mouth dryness noted.  Chest:Clear to auscultation without wheezing, rhonchi or crackles noted.  Heart:S1+S2+0, regular and normal without murmurs, rubs or gallops noted.   Abdomen:Soft, non-tender and non-distended.  Extremities:There isnoedema in the distal lower extremities bilaterally.R AFO in place for chronic R foot drop.His right leg is smaller in caliber than the L, unchanged. Mild flexion deformity, R>L of middle and ring fingers, no palpable cord as in Dupuytren's contracture.  Skin: Warm and dry without trophic changes noted.   Musculoskeletal: exam reveals no other obvious joint deformities. Some LBP on the R.   Neurologically:  Mental status: The patient is awake, alert and oriented in all 4 spheres. His memory, attention, language and knowledge are appropriate. There is no aphasia, agnosia, apraxia or anomia. Speech is clear with normal prosody and enunciation. Thought process is linear. Mood is  congruent and affect is normal.  Cranial nerves are as described above under HEENT exam.  Motor exam:mild hand muscle wasting stable, global strength of 4 out of 5. Slightly weaker on the right side,Right foot drop, has a brace around the right ankle.Romberg is not tested for safety.Fine motor skills are mildly impaired globally. Cerebellar testing shows no dysmetria or intention tremor. There is no truncal or gait ataxia.  Sensory exam is intact to light touch. Gait, station and balance:His posture is fairly good, he walks with a single-point cane, no obvious limp, but does walk a little slowly and cautiously, stable overall.  Assessment and Plan:   In summary, Dozier Berkovich is a very pleasant38 year old male with an underlying medical history of hypertension,priorsmoking, hyperlipidemia, type 2 diabetes, degenerative back disease, s/p multiple back surgeris, TIAs, L left thalamic stroke, and b/l frontal lobe embolic strokes in 7989, who presents for FU consultation of his stroke and sleep apnea. He had full stroke work-up in the hospital in February 2021, was started on Plavix and had been on a study drug as well, he finished this in February 2022. He has seen GI for elevated liver enzymes, currently, he reports his alk phos is a little elevated but otherwise his numbers have normalized.  He is compliant with his CPAP.  He had 2 recent falls.  We talked about the importance of fall prevention.  He has developed mild flexion deformities in his middle and ring fingers bilaterally, right a little more than left.  This may come from his neck I explained to him. Could also be sequelae from his b/l strokes. He has been told it could also come from carpal tunnel.  He is encouraged to continue with his OT exercises.  He is commended on his treatment adherence and advised to continue with all his medications. He is advised to follow-up routinely in 1 year.  I answered all the questions today and the  patient and his wife were in agreement.  I spent  30 minutes in total face-to-face time and in reviewing records during pre-charting, more than 50% of which was spent in counseling and coordination of care, reviewing test results, reviewing medications and treatment regimen and/or in discussing or reviewing the diagnosis of stroke, OSA, the prognosis and treatment options. Pertinent laboratory and imaging test results that were available during this visit with the patient were reviewed by me and considered in my medical decision making (see chart for details).

## 2020-07-20 NOTE — Patient Instructions (Signed)
It was good to see you again today.  It is always nice to see you both. You are fully compliant with your CPAP, keep up the good work!  I have renewed your CPAP supplies.  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 I am glad to hear that Meredith Mody is doing well as well. Please follow-up routinely in 1 year for checkup.

## 2020-07-27 NOTE — Progress Notes (Signed)
[facility] Summary Report / Loop Recorder 

## 2020-08-10 DIAGNOSIS — M5416 Radiculopathy, lumbar region: Secondary | ICD-10-CM | POA: Diagnosis not present

## 2020-08-17 ENCOUNTER — Ambulatory Visit (INDEPENDENT_AMBULATORY_CARE_PROVIDER_SITE_OTHER): Payer: Medicare Other

## 2020-08-17 DIAGNOSIS — I639 Cerebral infarction, unspecified: Secondary | ICD-10-CM

## 2020-08-18 LAB — CUP PACEART REMOTE DEVICE CHECK
Date Time Interrogation Session: 20220424230719
Implantable Pulse Generator Implant Date: 20210211

## 2020-08-20 ENCOUNTER — Ambulatory Visit (INDEPENDENT_AMBULATORY_CARE_PROVIDER_SITE_OTHER): Payer: Medicare Other | Admitting: Podiatry

## 2020-08-20 ENCOUNTER — Other Ambulatory Visit: Payer: Self-pay

## 2020-08-20 ENCOUNTER — Encounter: Payer: Self-pay | Admitting: Podiatry

## 2020-08-20 DIAGNOSIS — M79675 Pain in left toe(s): Secondary | ICD-10-CM

## 2020-08-20 DIAGNOSIS — E1142 Type 2 diabetes mellitus with diabetic polyneuropathy: Secondary | ICD-10-CM

## 2020-08-20 DIAGNOSIS — L84 Corns and callosities: Secondary | ICD-10-CM

## 2020-08-20 DIAGNOSIS — M79674 Pain in right toe(s): Secondary | ICD-10-CM

## 2020-08-20 DIAGNOSIS — M21371 Foot drop, right foot: Secondary | ICD-10-CM

## 2020-08-20 DIAGNOSIS — B351 Tinea unguium: Secondary | ICD-10-CM | POA: Diagnosis not present

## 2020-08-20 NOTE — Progress Notes (Signed)
This patient returns to my office for at risk foot care.  This patient requires this care by a professional since this patient will be at risk due to having type 2 diabetes and CVA.  This patient is unable to cut nails himself since the patient cannot reach his nails.These nails are painful walking and wearing shoes.  This patient presents for at risk foot care today.  General Appearance  Alert, conversant and in no acute stress.  Vascular  Dorsalis pedis and posterior tibial  pulses are palpable  bilaterally.  Capillary return is within normal limits  bilaterally. Temperature is within normal limits  bilaterally.  Neurologic  Senn-Weinstein monofilament wire test diminished   bilaterally. Muscle power within normal limits bilaterally.  Nails Thick disfigured discolored nails with subungual debris  from hallux to fifth toes bilaterally. No evidence of bacterial infection or drainage bilaterally.  Orthopedic  No limitations of motion  feet .  No crepitus or effusions noted.  No bony pathology or digital deformities noted.  Skin  normotropic skin with no porokeratosis noted bilaterally.  No signs of infections or ulcers noted.   Asymptomatic heel callus  Onychomycosis  Pain in right toes  Pain in left toes  Consent was obtained for treatment procedures.   Mechanical debridement of nails 1-5  bilaterally performed with a nail nipper.  Filed with dremel without incident.   Filed heel callus with dremel.   Return office visit    3 months                 Told patient to return for periodic foot care and evaluation due to potential at risk complications.   Savvas Roper DPM  

## 2020-08-24 ENCOUNTER — Ambulatory Visit: Payer: Medicare Other | Admitting: Podiatry

## 2020-09-04 NOTE — Progress Notes (Signed)
[facility] Summary Report / Loop Recorder 

## 2020-09-15 IMAGING — MR MR HEAD W/O CM
5 of 6 series · 29 of 48 positions shown · non-contrast
Comparison: Prior brain MRI examinations 06/06/2019 and earlier.

CLINICAL DATA: Stroke, follow-up cerebral infarction due to
thrombosis of middle cerebral, [place] stroke study protocol.

EXAM:
MRI HEAD WITHOUT CONTRAST
TECHNIQUE: Multiplanar, multiecho pulse sequences of the brain and surrounding
structures were obtained without intravenous contrast.

[Series 2: DWI · axial · 3.0mm · 0.94mm/px · z∈[-52,+94]mm · 9 of 100 slices shown]
[im 1/100]
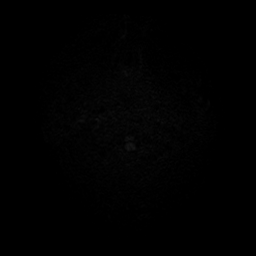
[im 17/100]
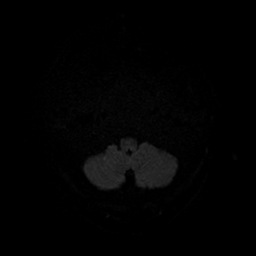
[im 34/100]
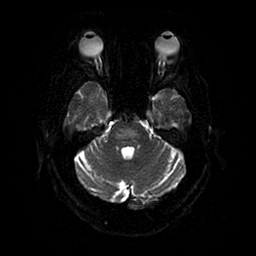
[im 42/100]
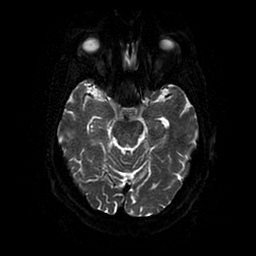
[im 50/100]
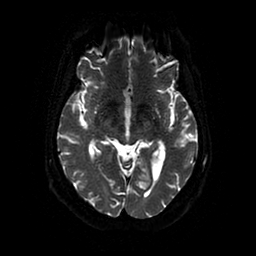
[im 58/100]
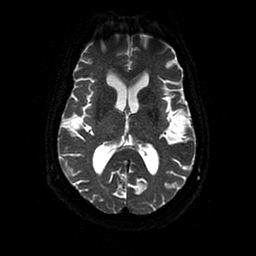
[im 67/100]
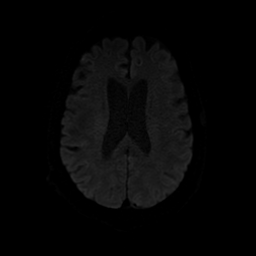
[im 83/100]
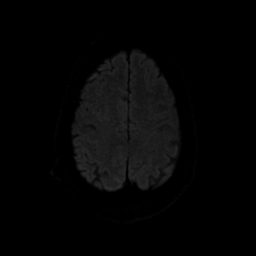
[im 100/100]
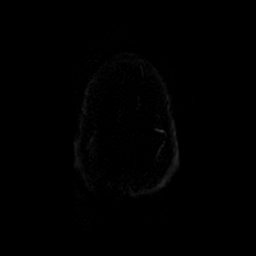

[Series 3: FLAIR · axial · 3.0mm · 0.45mm/px · z∈[-52,+94]mm · 6 of 50 slices shown]
[im 1/50]
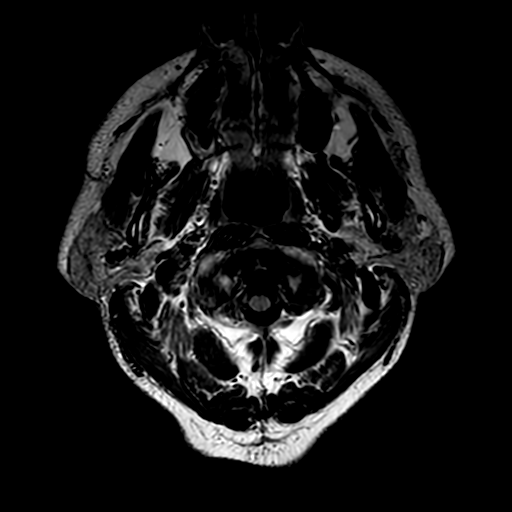
[im 10/50]
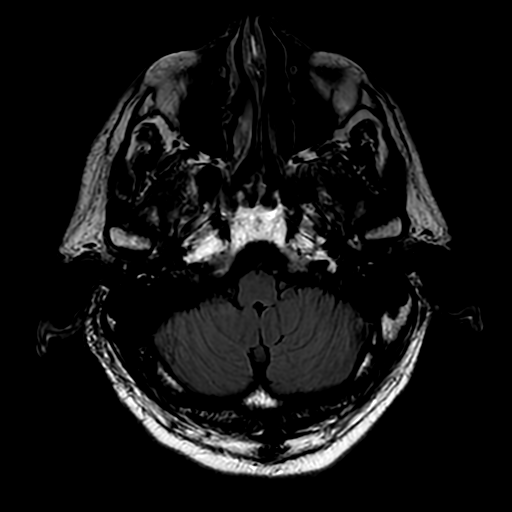
[im 20/50]
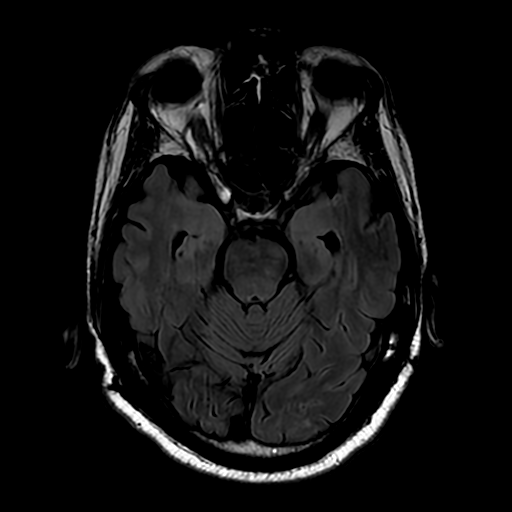
[im 30/50]
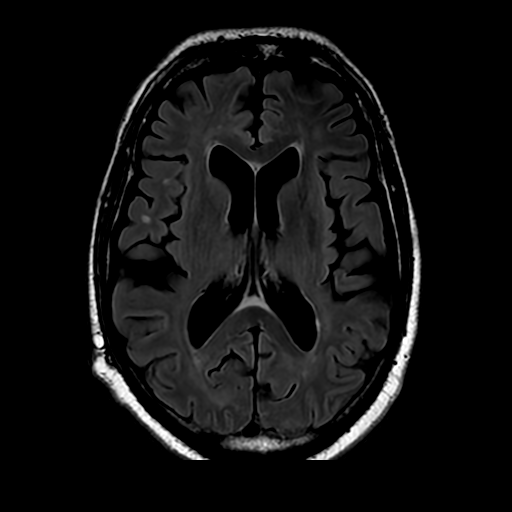
[im 40/50]
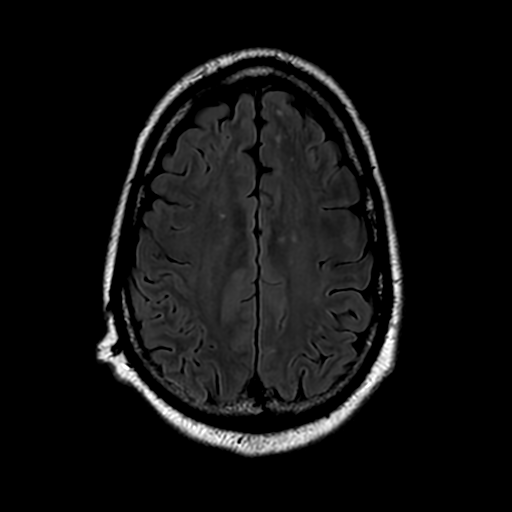
[im 50/50]
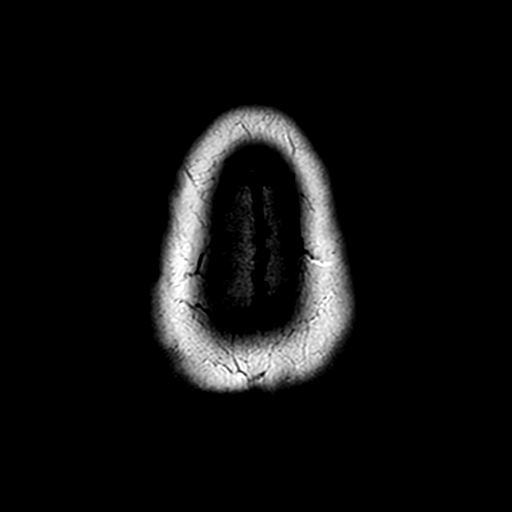

[Series 4: SWI · axial · 3.0mm · 0.47mm/px · z∈[-53,+8]mm · 4 of 100 slices shown]
[im 1/100]
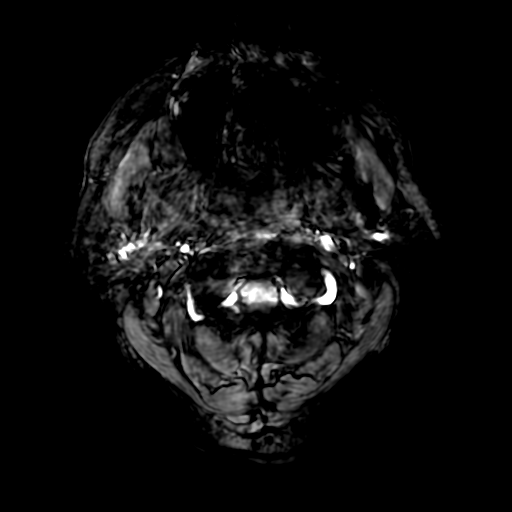
[im 17/100]
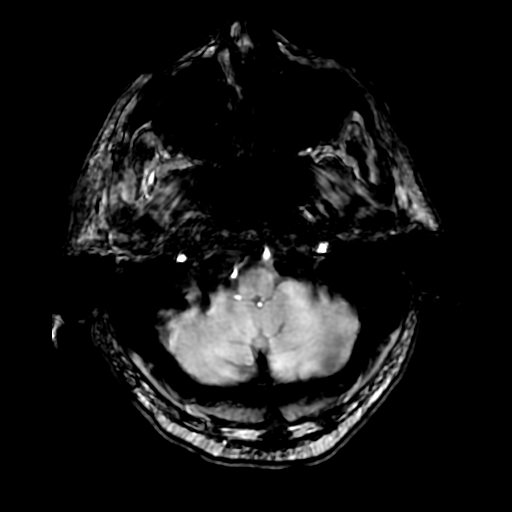
[im 34/100]
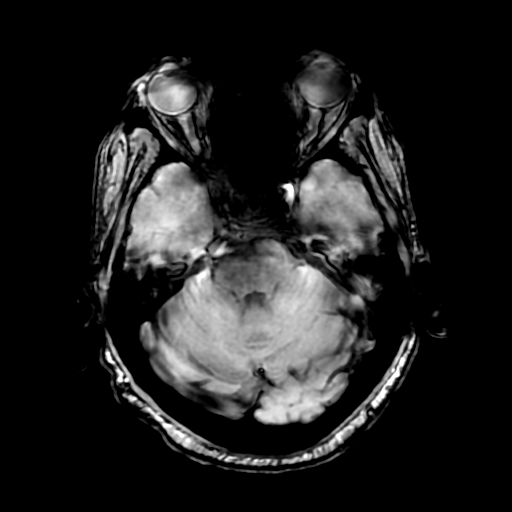
[im 42/100]
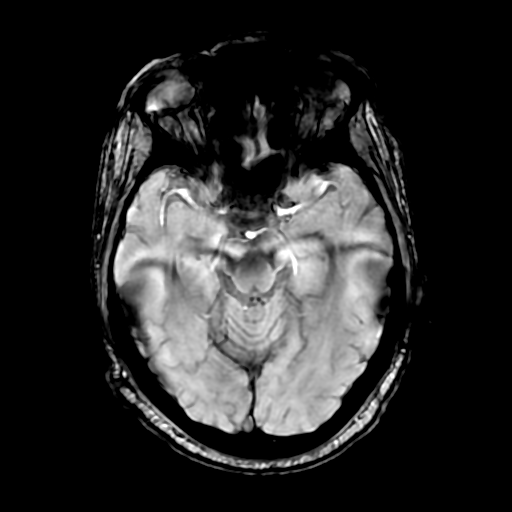

[Series 5: T2 · axial · 5.0mm · 0.47mm/px · z∈[-67,+100]mm · 4 of 29 slices shown]
[im 1/29]
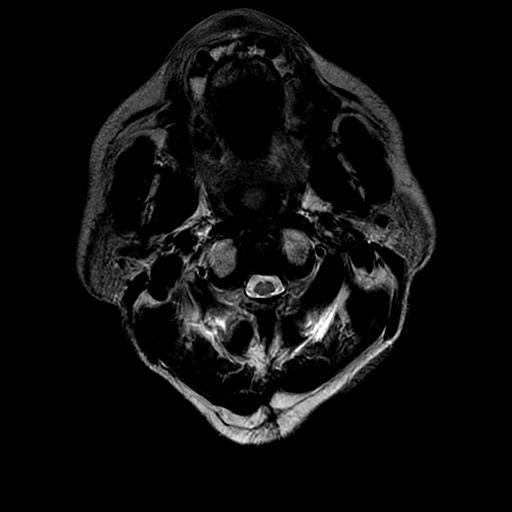
[im 10/29]
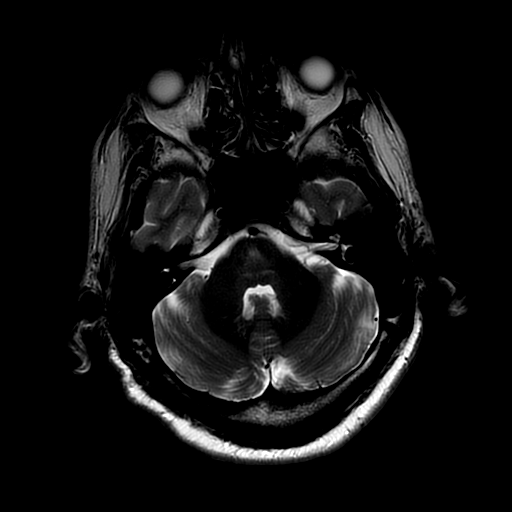
[im 19/29]
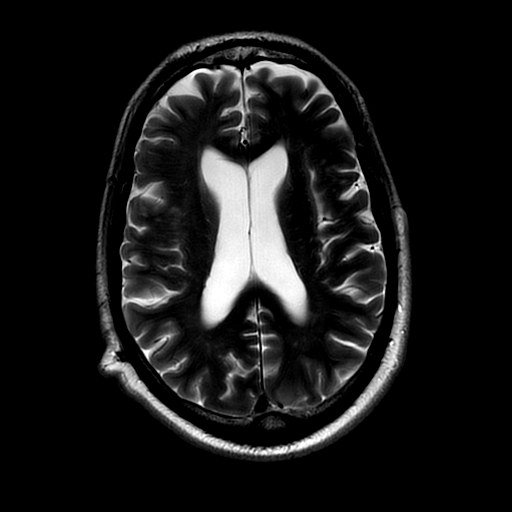
[im 29/29]
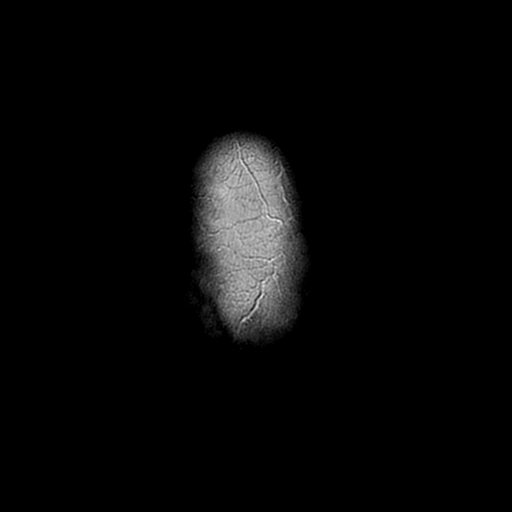

[Series 250: ADC · axial · 3.0mm · 0.94mm/px · z∈[-52,+94]mm · 6 of 50 slices shown]
[im 1/50]
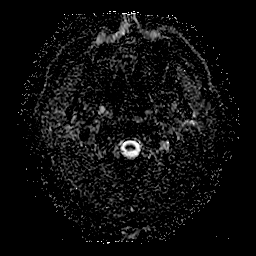
[im 10/50]
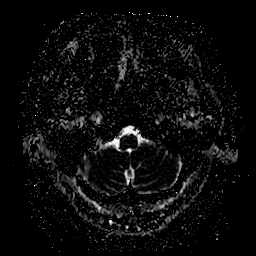
[im 20/50]
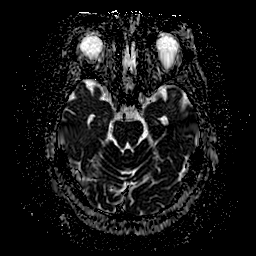
[im 30/50]
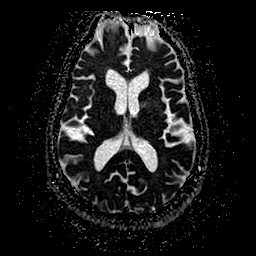
[im 40/50]
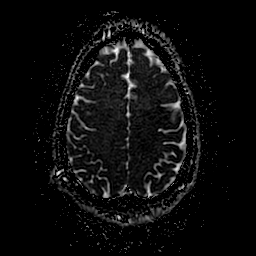
[im 50/50]
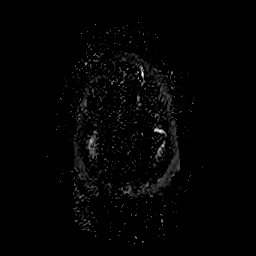

[29 of 48 positions shown; findings below may reference images not displayed]

FINDINGS: Brain:

A research protocol brain MRI was performed with axial
diffusion-weighted, axial T2/FLAIR, axial SWI, axial T2 weighted and
axial T1 weighted sequences acquired.

Stable, mild generalized parenchymal atrophy.

Restricted diffusion has resolved at sites of the punctate
cortical/subcortical frontal lobe infarcts demonstrated on the prior
brain MRI of 06/06/2019 (acute/early subacute at that time). No
interval infarct is identified.

Stable background chronic small vessel ischemic disease which is
mild in the cerebral white matter and moderate in the pons.
Redemonstrated chronic left thalamic lacunar infarct.

No evidence of intracranial mass.

No chronic intracranial blood products.

No extra-axial fluid collection.

No midline shift.

Vascular: Expected proximal arterial flow voids.

Skull and upper cervical spine: Focal marrow lesion is identified on
the acquired sequences.

Sinuses/Orbits: Visualized orbits show no acute finding. Mild
frontal and ethmoid sinus mucosal thickening. No significant mastoid
effusion.
IMPRESSION: No evidence of acute intracranial abnormality.

Restricted diffusion has resolved at sites of the punctate
cortical/subcortical frontal lobe infarcts demonstrated on the prior
brain MRI of 06/06/2019 (acute/early subacute at that time).

Stable background generalized parenchymal atrophy and chronic small
vessel ischemic disease. This includes a chronic left thalamic
lacunar infarct.

Mild frontal and ethmoid sinus mucosal thickening.

## 2020-09-21 LAB — CUP PACEART REMOTE DEVICE CHECK
Date Time Interrogation Session: 20220527230418
Implantable Pulse Generator Implant Date: 20210211

## 2020-09-22 ENCOUNTER — Ambulatory Visit (INDEPENDENT_AMBULATORY_CARE_PROVIDER_SITE_OTHER): Payer: Medicare Other

## 2020-09-22 DIAGNOSIS — I63413 Cerebral infarction due to embolism of bilateral middle cerebral arteries: Secondary | ICD-10-CM

## 2020-09-22 DIAGNOSIS — D649 Anemia, unspecified: Secondary | ICD-10-CM | POA: Diagnosis not present

## 2020-09-22 DIAGNOSIS — I10 Essential (primary) hypertension: Secondary | ICD-10-CM | POA: Diagnosis not present

## 2020-09-22 DIAGNOSIS — K219 Gastro-esophageal reflux disease without esophagitis: Secondary | ICD-10-CM | POA: Diagnosis not present

## 2020-09-22 DIAGNOSIS — E1129 Type 2 diabetes mellitus with other diabetic kidney complication: Secondary | ICD-10-CM | POA: Diagnosis not present

## 2020-09-22 DIAGNOSIS — E785 Hyperlipidemia, unspecified: Secondary | ICD-10-CM | POA: Diagnosis not present

## 2020-10-15 NOTE — Progress Notes (Signed)
[facility] Summary Report / Loop Recorder 

## 2020-10-22 DIAGNOSIS — I10 Essential (primary) hypertension: Secondary | ICD-10-CM | POA: Diagnosis not present

## 2020-10-22 DIAGNOSIS — E785 Hyperlipidemia, unspecified: Secondary | ICD-10-CM | POA: Diagnosis not present

## 2020-10-22 DIAGNOSIS — E1129 Type 2 diabetes mellitus with other diabetic kidney complication: Secondary | ICD-10-CM | POA: Diagnosis not present

## 2020-10-23 DIAGNOSIS — Z20822 Contact with and (suspected) exposure to covid-19: Secondary | ICD-10-CM | POA: Diagnosis not present

## 2020-10-27 ENCOUNTER — Ambulatory Visit (INDEPENDENT_AMBULATORY_CARE_PROVIDER_SITE_OTHER): Payer: Medicare Other

## 2020-10-27 DIAGNOSIS — I63413 Cerebral infarction due to embolism of bilateral middle cerebral arteries: Secondary | ICD-10-CM | POA: Diagnosis not present

## 2020-10-29 LAB — CUP PACEART REMOTE DEVICE CHECK
Date Time Interrogation Session: 20220629230713
Implantable Pulse Generator Implant Date: 20210211

## 2020-11-03 DIAGNOSIS — M5412 Radiculopathy, cervical region: Secondary | ICD-10-CM | POA: Diagnosis not present

## 2020-11-17 NOTE — Progress Notes (Signed)
[facility] Summary Report / Loop Recorder 

## 2020-11-19 ENCOUNTER — Ambulatory Visit: Payer: Medicare Other | Admitting: Podiatry

## 2020-11-19 DIAGNOSIS — M5416 Radiculopathy, lumbar region: Secondary | ICD-10-CM | POA: Diagnosis not present

## 2020-11-26 LAB — CUP PACEART REMOTE DEVICE CHECK
Date Time Interrogation Session: 20220801230212
Implantable Pulse Generator Implant Date: 20210211

## 2020-11-30 ENCOUNTER — Ambulatory Visit (INDEPENDENT_AMBULATORY_CARE_PROVIDER_SITE_OTHER): Payer: Medicare Other

## 2020-11-30 DIAGNOSIS — I63413 Cerebral infarction due to embolism of bilateral middle cerebral arteries: Secondary | ICD-10-CM

## 2020-12-15 DIAGNOSIS — H40013 Open angle with borderline findings, low risk, bilateral: Secondary | ICD-10-CM | POA: Diagnosis not present

## 2020-12-15 DIAGNOSIS — H2512 Age-related nuclear cataract, left eye: Secondary | ICD-10-CM | POA: Diagnosis not present

## 2020-12-15 DIAGNOSIS — E119 Type 2 diabetes mellitus without complications: Secondary | ICD-10-CM | POA: Diagnosis not present

## 2020-12-15 DIAGNOSIS — Z961 Presence of intraocular lens: Secondary | ICD-10-CM | POA: Diagnosis not present

## 2020-12-23 DIAGNOSIS — E1129 Type 2 diabetes mellitus with other diabetic kidney complication: Secondary | ICD-10-CM | POA: Diagnosis not present

## 2020-12-23 DIAGNOSIS — I10 Essential (primary) hypertension: Secondary | ICD-10-CM | POA: Diagnosis not present

## 2020-12-23 DIAGNOSIS — E785 Hyperlipidemia, unspecified: Secondary | ICD-10-CM | POA: Diagnosis not present

## 2020-12-24 NOTE — Progress Notes (Signed)
[facility] Summary Report / Loop Recorder 

## 2021-01-04 ENCOUNTER — Ambulatory Visit (INDEPENDENT_AMBULATORY_CARE_PROVIDER_SITE_OTHER): Payer: Medicare Other

## 2021-01-04 DIAGNOSIS — I63413 Cerebral infarction due to embolism of bilateral middle cerebral arteries: Secondary | ICD-10-CM | POA: Diagnosis not present

## 2021-01-07 ENCOUNTER — Telehealth: Payer: Self-pay

## 2021-01-07 LAB — CUP PACEART REMOTE DEVICE CHECK
Date Time Interrogation Session: 20220903230839
Implantable Pulse Generator Implant Date: 20210211

## 2021-01-07 NOTE — Telephone Encounter (Signed)
-----   Message from Algernon Huxley, RN sent at 07/01/2020 10:16 AM EST ----- Regarding: LFT's Pt needs LFT's in 6 mth

## 2021-01-07 NOTE — Telephone Encounter (Signed)
Left detailed vm to remind patient that he is due for repeat labs at this time. Advised that no appt is necessary and he can stop by at his convenience to have labs drawn. Advised patient to give [place] a call back if he has any questions. My Chart message sent to patient as well.

## 2021-01-13 ENCOUNTER — Telehealth: Payer: Self-pay | Admitting: Internal Medicine

## 2021-01-13 DIAGNOSIS — I10 Essential (primary) hypertension: Secondary | ICD-10-CM | POA: Diagnosis not present

## 2021-01-13 DIAGNOSIS — M4712 Other spondylosis with myelopathy, cervical region: Secondary | ICD-10-CM | POA: Diagnosis not present

## 2021-01-13 DIAGNOSIS — E1140 Type 2 diabetes mellitus with diabetic neuropathy, unspecified: Secondary | ICD-10-CM | POA: Diagnosis not present

## 2021-01-13 DIAGNOSIS — R7989 Other specified abnormal findings of blood chemistry: Secondary | ICD-10-CM | POA: Diagnosis not present

## 2021-01-13 DIAGNOSIS — R748 Abnormal levels of other serum enzymes: Secondary | ICD-10-CM | POA: Diagnosis not present

## 2021-01-13 DIAGNOSIS — I7300 Raynaud's syndrome without gangrene: Secondary | ICD-10-CM | POA: Diagnosis not present

## 2021-01-13 DIAGNOSIS — M21379 Foot drop, unspecified foot: Secondary | ICD-10-CM | POA: Diagnosis not present

## 2021-01-13 DIAGNOSIS — N400 Enlarged prostate without lower urinary tract symptoms: Secondary | ICD-10-CM | POA: Diagnosis not present

## 2021-01-13 DIAGNOSIS — E785 Hyperlipidemia, unspecified: Secondary | ICD-10-CM | POA: Diagnosis not present

## 2021-01-13 DIAGNOSIS — D649 Anemia, unspecified: Secondary | ICD-10-CM | POA: Diagnosis not present

## 2021-01-13 DIAGNOSIS — Z23 Encounter for immunization: Secondary | ICD-10-CM | POA: Diagnosis not present

## 2021-01-13 DIAGNOSIS — G8191 Hemiplegia, unspecified affecting right dominant side: Secondary | ICD-10-CM | POA: Diagnosis not present

## 2021-01-13 DIAGNOSIS — G629 Polyneuropathy, unspecified: Secondary | ICD-10-CM | POA: Diagnosis not present

## 2021-01-13 DIAGNOSIS — R2689 Other abnormalities of gait and mobility: Secondary | ICD-10-CM | POA: Diagnosis not present

## 2021-01-13 DIAGNOSIS — E1129 Type 2 diabetes mellitus with other diabetic kidney complication: Secondary | ICD-10-CM | POA: Diagnosis not present

## 2021-01-13 NOTE — Telephone Encounter (Signed)
Inbound call from pt stating that he had his labs done from Dr. Brigitte Pulse and he will send over a copy to Dr. Henrene Pastor. Thank you.

## 2021-01-13 NOTE — Telephone Encounter (Signed)
Noted  

## 2021-01-13 NOTE — Progress Notes (Signed)
[facility] Summary Report / Loop Recorder 

## 2021-01-22 DIAGNOSIS — I10 Essential (primary) hypertension: Secondary | ICD-10-CM | POA: Diagnosis not present

## 2021-01-22 DIAGNOSIS — E1129 Type 2 diabetes mellitus with other diabetic kidney complication: Secondary | ICD-10-CM | POA: Diagnosis not present

## 2021-01-22 DIAGNOSIS — E785 Hyperlipidemia, unspecified: Secondary | ICD-10-CM | POA: Diagnosis not present

## 2021-02-08 ENCOUNTER — Ambulatory Visit (INDEPENDENT_AMBULATORY_CARE_PROVIDER_SITE_OTHER): Payer: Medicare Other

## 2021-02-08 DIAGNOSIS — I63413 Cerebral infarction due to embolism of bilateral middle cerebral arteries: Secondary | ICD-10-CM

## 2021-02-10 LAB — CUP PACEART REMOTE DEVICE CHECK
Date Time Interrogation Session: 20221007000716
Implantable Pulse Generator Implant Date: 20210211

## 2021-02-17 DIAGNOSIS — E785 Hyperlipidemia, unspecified: Secondary | ICD-10-CM | POA: Diagnosis not present

## 2021-02-17 DIAGNOSIS — R6883 Chills (without fever): Secondary | ICD-10-CM | POA: Diagnosis not present

## 2021-02-17 DIAGNOSIS — Z1212 Encounter for screening for malignant neoplasm of rectum: Secondary | ICD-10-CM | POA: Diagnosis not present

## 2021-02-17 DIAGNOSIS — R310 Gross hematuria: Secondary | ICD-10-CM | POA: Diagnosis not present

## 2021-02-17 DIAGNOSIS — R634 Abnormal weight loss: Secondary | ICD-10-CM | POA: Diagnosis not present

## 2021-02-17 DIAGNOSIS — D649 Anemia, unspecified: Secondary | ICD-10-CM | POA: Diagnosis not present

## 2021-02-17 DIAGNOSIS — I10 Essential (primary) hypertension: Secondary | ICD-10-CM | POA: Diagnosis not present

## 2021-02-17 NOTE — Progress Notes (Signed)
[facility] Summary Report / Loop Recorder 

## 2021-02-18 DIAGNOSIS — H40013 Open angle with borderline findings, low risk, bilateral: Secondary | ICD-10-CM | POA: Diagnosis not present

## 2021-02-18 DIAGNOSIS — Z961 Presence of intraocular lens: Secondary | ICD-10-CM | POA: Diagnosis not present

## 2021-02-22 DIAGNOSIS — E1129 Type 2 diabetes mellitus with other diabetic kidney complication: Secondary | ICD-10-CM | POA: Diagnosis not present

## 2021-02-22 DIAGNOSIS — I10 Essential (primary) hypertension: Secondary | ICD-10-CM | POA: Diagnosis not present

## 2021-02-22 DIAGNOSIS — E785 Hyperlipidemia, unspecified: Secondary | ICD-10-CM | POA: Diagnosis not present

## 2021-02-23 DIAGNOSIS — K629 Disease of anus and rectum, unspecified: Secondary | ICD-10-CM | POA: Diagnosis not present

## 2021-02-23 DIAGNOSIS — N179 Acute kidney failure, unspecified: Secondary | ICD-10-CM | POA: Diagnosis not present

## 2021-02-23 DIAGNOSIS — D649 Anemia, unspecified: Secondary | ICD-10-CM | POA: Diagnosis not present

## 2021-02-23 DIAGNOSIS — E1140 Type 2 diabetes mellitus with diabetic neuropathy, unspecified: Secondary | ICD-10-CM | POA: Diagnosis not present

## 2021-02-23 DIAGNOSIS — K611 Rectal abscess: Secondary | ICD-10-CM | POA: Diagnosis not present

## 2021-02-23 DIAGNOSIS — D72825 Bandemia: Secondary | ICD-10-CM | POA: Diagnosis not present

## 2021-02-23 DIAGNOSIS — I10 Essential (primary) hypertension: Secondary | ICD-10-CM | POA: Diagnosis not present

## 2021-02-23 DIAGNOSIS — E1129 Type 2 diabetes mellitus with other diabetic kidney complication: Secondary | ICD-10-CM | POA: Diagnosis not present

## 2021-02-26 DIAGNOSIS — K610 Anal abscess: Secondary | ICD-10-CM | POA: Diagnosis not present

## 2021-03-03 ENCOUNTER — Other Ambulatory Visit (HOSPITAL_COMMUNITY): Payer: Self-pay | Admitting: *Deleted

## 2021-03-04 ENCOUNTER — Ambulatory Visit (HOSPITAL_COMMUNITY)
Admission: RE | Admit: 2021-03-04 | Discharge: 2021-03-04 | Disposition: A | Discharge status: Home / Self Care | Payer: Medicare Other | Source: Ambulatory Visit | Attending: Internal Medicine | Admitting: Internal Medicine

## 2021-03-04 ENCOUNTER — Other Ambulatory Visit: Payer: Self-pay

## 2021-03-04 DIAGNOSIS — D649 Anemia, unspecified: Secondary | ICD-10-CM | POA: Insufficient documentation

## 2021-03-04 MED ORDER — SODIUM CHLORIDE 0.9 % IV SOLN
510.0000 mg | INTRAVENOUS | Status: DC
  Administered 2021-03-04: 510 mg via INTRAVENOUS
  Filled 2021-03-04: qty 510

## 2021-03-05 DIAGNOSIS — D649 Anemia, unspecified: Secondary | ICD-10-CM | POA: Diagnosis not present

## 2021-03-05 DIAGNOSIS — E1129 Type 2 diabetes mellitus with other diabetic kidney complication: Secondary | ICD-10-CM | POA: Diagnosis not present

## 2021-03-05 DIAGNOSIS — I10 Essential (primary) hypertension: Secondary | ICD-10-CM | POA: Diagnosis not present

## 2021-03-05 DIAGNOSIS — K611 Rectal abscess: Secondary | ICD-10-CM | POA: Diagnosis not present

## 2021-03-05 DIAGNOSIS — N179 Acute kidney failure, unspecified: Secondary | ICD-10-CM | POA: Diagnosis not present

## 2021-03-05 DIAGNOSIS — E1140 Type 2 diabetes mellitus with diabetic neuropathy, unspecified: Secondary | ICD-10-CM | POA: Diagnosis not present

## 2021-03-05 DIAGNOSIS — D72825 Bandemia: Secondary | ICD-10-CM | POA: Diagnosis not present

## 2021-03-10 DIAGNOSIS — D649 Anemia, unspecified: Secondary | ICD-10-CM | POA: Diagnosis not present

## 2021-03-10 DIAGNOSIS — I10 Essential (primary) hypertension: Secondary | ICD-10-CM | POA: Diagnosis not present

## 2021-03-10 DIAGNOSIS — D72825 Bandemia: Secondary | ICD-10-CM | POA: Diagnosis not present

## 2021-03-10 DIAGNOSIS — E1129 Type 2 diabetes mellitus with other diabetic kidney complication: Secondary | ICD-10-CM | POA: Diagnosis not present

## 2021-03-10 DIAGNOSIS — K611 Rectal abscess: Secondary | ICD-10-CM | POA: Diagnosis not present

## 2021-03-10 DIAGNOSIS — N179 Acute kidney failure, unspecified: Secondary | ICD-10-CM | POA: Diagnosis not present

## 2021-03-11 ENCOUNTER — Ambulatory Visit (HOSPITAL_COMMUNITY)
Admission: RE | Admit: 2021-03-11 | Discharge: 2021-03-11 | Disposition: A | Discharge status: Home / Self Care | Payer: Medicare Other | Source: Ambulatory Visit | Attending: Internal Medicine | Admitting: Internal Medicine

## 2021-03-11 ENCOUNTER — Other Ambulatory Visit: Payer: Self-pay

## 2021-03-11 DIAGNOSIS — D649 Anemia, unspecified: Secondary | ICD-10-CM | POA: Insufficient documentation

## 2021-03-11 MED ORDER — SODIUM CHLORIDE 0.9 % IV SOLN
510.0000 mg | INTRAVENOUS | Status: DC
  Administered 2021-03-11: 10:00:00 510 mg via INTRAVENOUS
  Filled 2021-03-11: qty 510

## 2021-03-12 DIAGNOSIS — Z20822 Contact with and (suspected) exposure to covid-19: Secondary | ICD-10-CM | POA: Diagnosis not present

## 2021-03-15 ENCOUNTER — Ambulatory Visit (INDEPENDENT_AMBULATORY_CARE_PROVIDER_SITE_OTHER): Payer: Medicare Other

## 2021-03-15 DIAGNOSIS — I63413 Cerebral infarction due to embolism of bilateral middle cerebral arteries: Secondary | ICD-10-CM | POA: Diagnosis not present

## 2021-03-16 DIAGNOSIS — M5459 Other low back pain: Secondary | ICD-10-CM | POA: Diagnosis not present

## 2021-03-16 LAB — CUP PACEART REMOTE DEVICE CHECK
Date Time Interrogation Session: 20221120231714
Implantable Pulse Generator Implant Date: 20210211

## 2021-03-24 DIAGNOSIS — E785 Hyperlipidemia, unspecified: Secondary | ICD-10-CM | POA: Diagnosis not present

## 2021-03-24 DIAGNOSIS — E1129 Type 2 diabetes mellitus with other diabetic kidney complication: Secondary | ICD-10-CM | POA: Diagnosis not present

## 2021-03-24 DIAGNOSIS — I10 Essential (primary) hypertension: Secondary | ICD-10-CM | POA: Diagnosis not present

## 2021-03-25 NOTE — Progress Notes (Signed)
[facility] Summary Report / Loop Recorder 

## 2021-04-09 DIAGNOSIS — D649 Anemia, unspecified: Secondary | ICD-10-CM | POA: Diagnosis not present

## 2021-04-09 DIAGNOSIS — N179 Acute kidney failure, unspecified: Secondary | ICD-10-CM | POA: Diagnosis not present

## 2021-04-09 DIAGNOSIS — K611 Rectal abscess: Secondary | ICD-10-CM | POA: Diagnosis not present

## 2021-04-09 DIAGNOSIS — I10 Essential (primary) hypertension: Secondary | ICD-10-CM | POA: Diagnosis not present

## 2021-04-16 ENCOUNTER — Ambulatory Visit (INDEPENDENT_AMBULATORY_CARE_PROVIDER_SITE_OTHER): Payer: Medicare Other

## 2021-04-16 DIAGNOSIS — I63413 Cerebral infarction due to embolism of bilateral middle cerebral arteries: Secondary | ICD-10-CM | POA: Diagnosis not present

## 2021-04-19 LAB — CUP PACEART REMOTE DEVICE CHECK
Date Time Interrogation Session: 20221223230657
Implantable Pulse Generator Implant Date: 20210211

## 2021-04-21 DIAGNOSIS — Z20822 Contact with and (suspected) exposure to covid-19: Secondary | ICD-10-CM | POA: Diagnosis not present

## 2021-04-23 DIAGNOSIS — I10 Essential (primary) hypertension: Secondary | ICD-10-CM | POA: Diagnosis not present

## 2021-04-23 DIAGNOSIS — E785 Hyperlipidemia, unspecified: Secondary | ICD-10-CM | POA: Diagnosis not present

## 2021-04-23 DIAGNOSIS — E1129 Type 2 diabetes mellitus with other diabetic kidney complication: Secondary | ICD-10-CM | POA: Diagnosis not present

## 2021-04-27 NOTE — Progress Notes (Signed)
[facility] Summary Report / Loop Recorder 

## 2021-05-13 ENCOUNTER — Ambulatory Visit (INDEPENDENT_AMBULATORY_CARE_PROVIDER_SITE_OTHER): Payer: Medicare Other | Admitting: Podiatry

## 2021-05-13 ENCOUNTER — Encounter: Payer: Self-pay | Admitting: Podiatry

## 2021-05-13 ENCOUNTER — Other Ambulatory Visit: Payer: Self-pay

## 2021-05-13 DIAGNOSIS — G8191 Hemiplegia, unspecified affecting right dominant side: Secondary | ICD-10-CM | POA: Insufficient documentation

## 2021-05-13 DIAGNOSIS — M79674 Pain in right toe(s): Secondary | ICD-10-CM | POA: Diagnosis not present

## 2021-05-13 DIAGNOSIS — R748 Abnormal levels of other serum enzymes: Secondary | ICD-10-CM | POA: Insufficient documentation

## 2021-05-13 DIAGNOSIS — B351 Tinea unguium: Secondary | ICD-10-CM | POA: Diagnosis not present

## 2021-05-13 DIAGNOSIS — E1142 Type 2 diabetes mellitus with diabetic polyneuropathy: Secondary | ICD-10-CM

## 2021-05-13 DIAGNOSIS — D649 Anemia, unspecified: Secondary | ICD-10-CM | POA: Insufficient documentation

## 2021-05-13 DIAGNOSIS — M79675 Pain in left toe(s): Secondary | ICD-10-CM | POA: Diagnosis not present

## 2021-05-13 DIAGNOSIS — D72825 Bandemia: Secondary | ICD-10-CM | POA: Insufficient documentation

## 2021-05-13 DIAGNOSIS — L84 Corns and callosities: Secondary | ICD-10-CM | POA: Diagnosis not present

## 2021-05-13 NOTE — Progress Notes (Signed)
This patient returns to my office for at risk foot care.  This patient requires this care by a professional since this patient will be at risk due to having type 2 diabetes and CVA.  This patient is unable to cut nails himself since the patient cannot reach his nails.These nails are painful walking and wearing shoes.  This patient presents for at risk foot care today.  General Appearance  Alert, conversant and in no acute stress.  Vascular  Dorsalis pedis and posterior tibial  pulses are palpable  bilaterally.  Capillary return is within normal limits  bilaterally. Temperature is within normal limits  bilaterally.  Neurologic  Senn-Weinstein monofilament wire test diminished   bilaterally. Muscle power within normal limits bilaterally.  Nails Thick disfigured discolored nails with subungual debris  from hallux to fifth toes bilaterally. No evidence of bacterial infection or drainage bilaterally.  Orthopedic  No limitations of motion  feet .  No crepitus or effusions noted.  No bony pathology or digital deformities noted.  Skin  normotropic skin with no porokeratosis noted bilaterally.  No signs of infections or ulcers noted.   Asymptomatic heel callus  Onychomycosis  Pain in right toes  Pain in left toes  Consent was obtained for treatment procedures.   Mechanical debridement of nails 1-5  bilaterally performed with a nail nipper.  Filed with dremel without incident.   Filed heel callus with dremel.   Return office visit    3 months                 Told patient to return for periodic foot care and evaluation due to potential at risk complications.   Treasure Ingrum DPM  

## 2021-05-19 DIAGNOSIS — D649 Anemia, unspecified: Secondary | ICD-10-CM | POA: Diagnosis not present

## 2021-05-19 DIAGNOSIS — N179 Acute kidney failure, unspecified: Secondary | ICD-10-CM | POA: Diagnosis not present

## 2021-05-19 DIAGNOSIS — I10 Essential (primary) hypertension: Secondary | ICD-10-CM | POA: Diagnosis not present

## 2021-05-20 LAB — CUP PACEART REMOTE DEVICE CHECK
Date Time Interrogation Session: 20230125230843
Implantable Pulse Generator Implant Date: 20210211

## 2021-05-23 DIAGNOSIS — I10 Essential (primary) hypertension: Secondary | ICD-10-CM | POA: Diagnosis not present

## 2021-05-23 DIAGNOSIS — E785 Hyperlipidemia, unspecified: Secondary | ICD-10-CM | POA: Diagnosis not present

## 2021-05-23 DIAGNOSIS — E1129 Type 2 diabetes mellitus with other diabetic kidney complication: Secondary | ICD-10-CM | POA: Diagnosis not present

## 2021-05-24 ENCOUNTER — Ambulatory Visit (INDEPENDENT_AMBULATORY_CARE_PROVIDER_SITE_OTHER): Payer: Medicare Other

## 2021-05-24 DIAGNOSIS — I63413 Cerebral infarction due to embolism of bilateral middle cerebral arteries: Secondary | ICD-10-CM | POA: Diagnosis not present

## 2021-06-01 NOTE — Progress Notes (Signed)
[facility] Summary Report / Loop Recorder 

## 2021-06-22 ENCOUNTER — Ambulatory Visit (INDEPENDENT_AMBULATORY_CARE_PROVIDER_SITE_OTHER): Payer: Medicare Other

## 2021-06-22 DIAGNOSIS — E785 Hyperlipidemia, unspecified: Secondary | ICD-10-CM | POA: Diagnosis not present

## 2021-06-22 DIAGNOSIS — E1129 Type 2 diabetes mellitus with other diabetic kidney complication: Secondary | ICD-10-CM | POA: Diagnosis not present

## 2021-06-22 DIAGNOSIS — I10 Essential (primary) hypertension: Secondary | ICD-10-CM | POA: Diagnosis not present

## 2021-06-22 DIAGNOSIS — I63413 Cerebral infarction due to embolism of bilateral middle cerebral arteries: Secondary | ICD-10-CM | POA: Diagnosis not present

## 2021-06-22 LAB — CUP PACEART REMOTE DEVICE CHECK
Date Time Interrogation Session: 20230227230905
Implantable Pulse Generator Implant Date: 20210211

## 2021-06-30 NOTE — Progress Notes (Signed)
[facility] Summary Report / Loop Recorder 

## 2021-07-21 ENCOUNTER — Telehealth: Payer: Self-pay | Admitting: Neurology

## 2021-07-21 ENCOUNTER — Ambulatory Visit: Payer: Medicare Other | Admitting: Neurology

## 2021-07-21 NOTE — Telephone Encounter (Signed)
Pt cancelled appt due to wife having hip surgery. ?

## 2021-07-22 DIAGNOSIS — Z20822 Contact with and (suspected) exposure to covid-19: Secondary | ICD-10-CM | POA: Diagnosis not present

## 2021-07-23 DIAGNOSIS — E785 Hyperlipidemia, unspecified: Secondary | ICD-10-CM | POA: Diagnosis not present

## 2021-07-23 DIAGNOSIS — E1129 Type 2 diabetes mellitus with other diabetic kidney complication: Secondary | ICD-10-CM | POA: Diagnosis not present

## 2021-07-23 DIAGNOSIS — I10 Essential (primary) hypertension: Secondary | ICD-10-CM | POA: Diagnosis not present

## 2021-07-26 ENCOUNTER — Ambulatory Visit (INDEPENDENT_AMBULATORY_CARE_PROVIDER_SITE_OTHER): Payer: Medicare Other

## 2021-07-26 DIAGNOSIS — I63413 Cerebral infarction due to embolism of bilateral middle cerebral arteries: Secondary | ICD-10-CM

## 2021-07-27 LAB — CUP PACEART REMOTE DEVICE CHECK
Date Time Interrogation Session: 20230401230530
Implantable Pulse Generator Implant Date: 20210211

## 2021-08-10 NOTE — Progress Notes (Signed)
[facility] Summary Report / Loop Recorder 

## 2021-08-12 ENCOUNTER — Ambulatory Visit: Payer: Medicare Other | Admitting: Podiatry

## 2021-08-18 DIAGNOSIS — Z20822 Contact with and (suspected) exposure to covid-19: Secondary | ICD-10-CM | POA: Diagnosis not present

## 2021-08-22 DIAGNOSIS — E785 Hyperlipidemia, unspecified: Secondary | ICD-10-CM | POA: Diagnosis not present

## 2021-08-22 DIAGNOSIS — I10 Essential (primary) hypertension: Secondary | ICD-10-CM | POA: Diagnosis not present

## 2021-08-22 DIAGNOSIS — E1129 Type 2 diabetes mellitus with other diabetic kidney complication: Secondary | ICD-10-CM | POA: Diagnosis not present

## 2021-08-27 DIAGNOSIS — Z20822 Contact with and (suspected) exposure to covid-19: Secondary | ICD-10-CM | POA: Diagnosis not present

## 2021-08-30 ENCOUNTER — Ambulatory Visit (INDEPENDENT_AMBULATORY_CARE_PROVIDER_SITE_OTHER): Payer: Medicare Other

## 2021-08-30 DIAGNOSIS — I63413 Cerebral infarction due to embolism of bilateral middle cerebral arteries: Secondary | ICD-10-CM

## 2021-09-16 NOTE — Progress Notes (Signed)
[facility] Summary Report / Loop Recorder 

## 2021-09-22 DIAGNOSIS — I10 Essential (primary) hypertension: Secondary | ICD-10-CM | POA: Diagnosis not present

## 2021-09-22 DIAGNOSIS — E785 Hyperlipidemia, unspecified: Secondary | ICD-10-CM | POA: Diagnosis not present

## 2021-09-22 DIAGNOSIS — E1129 Type 2 diabetes mellitus with other diabetic kidney complication: Secondary | ICD-10-CM | POA: Diagnosis not present

## 2021-10-04 ENCOUNTER — Ambulatory Visit (INDEPENDENT_AMBULATORY_CARE_PROVIDER_SITE_OTHER): Payer: Medicare Other

## 2021-10-04 DIAGNOSIS — I63413 Cerebral infarction due to embolism of bilateral middle cerebral arteries: Secondary | ICD-10-CM | POA: Diagnosis not present

## 2021-10-06 LAB — CUP PACEART REMOTE DEVICE CHECK
Date Time Interrogation Session: 20230606230548
Implantable Pulse Generator Implant Date: 20210211

## 2021-10-07 ENCOUNTER — Ambulatory Visit (INDEPENDENT_AMBULATORY_CARE_PROVIDER_SITE_OTHER): Payer: Medicare Other | Admitting: Neurology

## 2021-10-07 ENCOUNTER — Encounter: Payer: Self-pay | Admitting: Neurology

## 2021-10-07 VITALS — BP 147/78 | HR 87 | Ht 75.0 in | Wt 262.0 lb

## 2021-10-07 DIAGNOSIS — Z9989 Dependence on other enabling machines and devices: Secondary | ICD-10-CM

## 2021-10-07 DIAGNOSIS — I63413 Cerebral infarction due to embolism of bilateral middle cerebral arteries: Secondary | ICD-10-CM

## 2021-10-07 DIAGNOSIS — G4733 Obstructive sleep apnea (adult) (pediatric): Secondary | ICD-10-CM | POA: Diagnosis not present

## 2021-10-07 NOTE — Progress Notes (Signed)
Subjective:    Patient ID: Rodney Crane is a 71 y.o. male.  HPI    Interim history:   Rodney Crane is a 71 year old right-handed gentleman with an underlying medical history of hypertension, smoking, hyperlipidemia, type 2 diabetes, degenerative back disease with s/p multiple back surgeries, L thalamic stroke and mild obesity, who presents for follow-up consultation of his stroke and sleep apnea, on CPAP therapy. The patient is unaccompanied today. He had to miss his appointment on 07/21/21 due to his wife's surgery. I last saw him on 07/20/2020, at which time he was fully compliant with his CPAP machine.  He was doing overall quite well.  He had no recent strokelike symptoms.  He did have a couple of falls with minor skin injuries and minor bruising thankfully only.  He had recent cataract surgery, he was diagnosed with carpal tunnel syndrome by orthopedics.  He was using an AFO for his right leg.  He was advised to follow-up routinely in 1 year.  Today, 10/07/2021: I reviewed his CPAP compliance data from 09/05/2021 through 10/04/2021, which is a total of 30 days, during which time he used his machine every night with percent use days greater than 4 hours at 100%, indicating superb compliance with an average usage of 7 hours and 59 minutes, residual AHI at goal at 0.6/h, leak on the high side with slight fluctuation with the 95th percentile at 23.6 L/min on a pressure of 7 cm with EPR of 2.  I reviewed his CPAP compliance data from 06/20/2021 through 07/19/2021, which is a total of 30 days, during which time he used his machine every night with percent used days greater than 4 hours at 100%, indicating superb compliance with an average usage of 8 hours and 17 minutes, residual AHI at goal at 0.7/h, leak on the higher side with the 95th percentile at 21.7 L/min on a pressure of 7 cm with EPR of 2.  He reports doing well from the sleep apnea standpoint.  Unfortunately, he had a series of complications late  last year when he was on Farxiga.  He had developed significant side effects including frequent urination, dehydration, hematuria, and eventually developed a anal/rectal abscess with evidence of sepsis.  He was treated as an outpatient.  He had lost weight in the process and also lost blood.  He needed iron infusions.  He is slowly recuperated and feels much better now.  His wife had hip replacement surgery and is recuperating.  He is supposed to schedule his yearly physical.  He saw [place] [facility] for his perianal abscess.   The patient's allergies, current medications, family history, past medical history, past social history, past surgical history and problem list were reviewed and updated as appropriate.    Previously (copied from previous notes for reference):   I saw him on 07/10/2019, at which time we talked about his stroke and hospitalization for this in Feb. 2021. He was compliant with his CPAP. He had a loop recorder placed. MRI brain showed subacute frontal lobe MCA infarcts, embolic secondary to unknown source.  Echo with an EF of 65 to 70%, no source of embolus, LDL 56, lower extremity Doppler with no DVT.  CTA of the head and neck showed no LVO.  Mild left ICA bifurcation. A1c was 7.4. He was started on Plavix. He was on a stroke study drug, enrolled in a stroke research study. He has elevated liver enzymes, he had an US abdomen, which showed cholelithiasis and gallbladder polyps.  I reviewed his CPAP compliance data from 06/16/2020 through 07/15/2020, which is a total of 30 days, during which time he used his machine every night with percent use days greater than 4 hours at 100%, indicating superb compliance with an average usage of 8 hours and 42 minutes, residual AHI at goal at 1.1/h, leak on the higher side with a 95th percentile at 18 L/min on a pressure of 7 cm with EPR of 2.     I saw him on 05/09/2019, at which time he was compliant with his CPAP.    I reviewed compliance  data from 2/14 to 07/08/19. He reports feeling well, at baseline, loop recorder bothers him some at times.    I saw him on 05/07/2018, at which time he was compliant with his CPAP.  I wrote for a new machine.  He felt stable from the neurological and stroke standpoint.  He was using a cane, he was still using an AFO on the right.    I reviewed his CPAP compliance data from 04/08/2019 through 05/07/2019 which is a total of 30 days, during which time he used his machine every night with percent use days greater than 4 hours at 100%, indicating excellent compliance with an average usage of 8 hours and 20 minutes, residual AHI at goal at 0.9/h, leak acceptable with a 95th percentile at 10.2 L/min on a pressure of 7 cm with EPR of 2.  Set up date was July of last year.      I saw him on 05/01/2017, at which time he was compliant with his CPAP. I increased his pressure at the time because of residual sleep-disordered breathing noted. From the neurological standpoint he was stable. He was advised to follow-up in one year.   I reviewed his CPAP compliance data from 04/08/2018 through 05/07/2018 which is a total of 30 days, during which time he used his machine 29 days with percent used days greater than 4 hours at 97%, indicating excellent compliance with an average usage of 8 hours and 20 minutes, residual AHI borderline at 6.6 per hour, leak acceptable with the 95th percentile at 8.1 L/m on a pressure of 7 cm with EPR of 3.      Of note, he presented to the emergency room on 08/28/2016 with right thigh pain and swelling. He had no neurological new focal findings. He has a chronic foot drop. He had an interim hospital admission for central cord syndrome and had cervical spine surgery in March 2018. He had an EMG and nerve conduction test in February 2018 which I reviewed: Conclusion: There is electrodiagnostic evidence of chronic length-dependent sensorimotor axonal neuropathy. No suggestion of myopathy/myositis.  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 The Tibialis Anterior showed acute/ongoing denervation which could be due to radiculopathy or neuropathy. I would have expected more demyelinating-rage changes by now on NCV if the etiology of his weakness were due to demyelinating disease (such as Acute Inflammatory Demyelinating Polyneuropathy). Given history of worsening weakness after fall and neck injury, likely severe cervical stenosis as cause for his symptoms.      I saw him on 04/27/2016, at which time he reported difficulty using his CPAP because of flare up of his back condition. He has had multiple back surgeries and was not able to sleep in his bed. He was using his pain medication sparingly. I encouraged him to try to use CPAP as much is possible. He had  no new strokelike symptoms in the interim.   I reviewed his CPAP compliance data from 04/01/2017 through 04/30/2017 which is a total of 30 days, during which time he used his machine every night with percent used days greater than 4 hours at 100%, indicating superb compliance. Average usage of 8 hours and 5 minutes, which is great, residual AHI suboptimal at 10.4 per hour, mostly secondary to obstructive events, leak on the lower end with the 95th percentile at 7.6 L/m on a pressure of 7 cm with EPR of 3.  I saw him on 04/23/2015, at which time he reported doing well.he had a recent MRI with contrast on 01/01/15. He had seen Dr. Einar Gip in cardiology and a stress test, which was fine, per patient. His brain MRI w contrast did show evidence of an evolving L thalamic infarct. I had suggested a cardiology referral because of chest pains reported.    I reviewed his CPAP compliance data from 03/20/2016 through 04/18/2016, which is a total of 30 days, during which time he used his machine only 8 days. His last 90 day compliance showed full compliance until 03/25/2016.   I saw him on 12/22/2014 at which  time he reported a recent history of left facial numbness and tingling and subjective feeling of tongue swelling. On examination he had decreased sensation of his left midface and very slight right grip strength weakness. On MRI brain he had evidence of a recent left thalamic stroke. We talked about stroke prevention, he was advised to continue with Plavix and I suggested a repeat brain MRI with contrast as well as a stroke consult with Dr. Erlinda Hong. I also suggested a cardiology referral due to chest pains reported.   I reviewed his CPAP compliance data from 03/22/15 through 04/20/15, which is a total of 30 days during which time the patient used CPAP every night, with percent used days greater than 4 hours at 100%, indicating superb compliance with an average usage of 7 hours and 6 minutes, residual AHI low at 1 per hour, leak low with the 95th percentile at 5.5 L/m on a pressure of 7 cm with EPR of 3.   I saw him on 01/15/2014 for sleep apnea follow-up at which time the patient reported doing well with CPAP and he was using it and was fully compliant with treatment. He reported that he had residual back pain and may need a spinal cord stimulator. He reported a 20 pound weight loss overall. I suggested a one-year follow-up.   He had a brain MRI without contrast on 12/04/2014: Punctate subacute infarction in the left thalamus. Mild leukoarosis, unchanged. This is most likely due to chronic microvascular disease. Increased signal intensity in the pons that may represent a demyelinating process versus chronic microvascular disease, unchanged. He had a carotid Doppler study at [facility] on 12/10/2014 with impression: Minimal bilateral atherosclerosis without flow limiting stenosis, less than 50% stenosis bilaterally.   He reports that he started having some symptoms about a month ago. He started having numbness and tingling in his left face and his tongue felt swollen. He had changes in his taste  and report some loss of appetite. He has lost weight. He had some grip strength weakness on the right which his wife noted that he tried to downplay it at the time. She became really concerned that she also noted slurring of speech intermittently, especially when he was tired and fatigued. He reports significant fatigue for the past month or 2.  He has a history of foot drop and low back pain and these symptoms seem unchanged. Since his MRI earlier this month he was told to stop his baby aspirin and start Plavix. He has been taking Plavix regularly since then. He does admit that he was not always fully compliant with his baby aspirin. He had some intermittent chest pain. He did not bring this up necessarily at his doctor's appointment. He tries to work out regularly. He has been exercising at the gym 3 to even 4 times a week. He has not seen a cardiologist in years. He does admit to smoking some cigarettes. His wife provides additional information. She states that he tries to drink water and avoid sodas. He has improved in that regard. He still may not drink enough water. He is fully compliant with CPAP therapy. He had recent supplies delivered.   He had an MRI lumbar spine with and without contrast through Dr. Susa Day on 10/18/2006 (Indication: Left-sided back pain radiating to both legs. Weakness and numbness on the left side, ongoing for 60 months. History per lumbar surgery): 1. Multilevel central, foraminal, and subarticular lateral recess stenoses as detailed above, with the most striking findings at the L3-L4 level. 2. There is a suggestion of dural enhancement and potentially peripheral nerve root clumping in the lower portion of the thecal sac, suspicious for mild arachnoiditis.   I reviewed his CPAP compliance data from 11/21/2014 through 12/20/2014 which is a total of 30 days during which time he used his machine every night with percent used days greater than 4 hours at 100%, indicating  superb compliance with an average usage of 7 hours and 16 minutes, residual AHI at 0.8 per hour, leaked low with the 95th percentile at 3.5 L/m on a pressure of 7 cm with EPR of 3.   I saw him on 07/15/2013, at which time we talked about his sleep study results as well as his compliance data. He reported having adjusted well to CPAP, but he had nasal congestion and some nasal dryness. He felt better and his wife endorsed that he slept better.   I reviewed his compliance data from 12/14/2013 through 01/12/2014 which is a total of 30 days during which time he used CPAP every night. Percent used days greater than 4 hours was 100%, indicating superb compliance. Average usage was 8 hours and 12 minutes, residual AHI low at 1.2 per hour and leak very low at 1.2 L per minute at the 95th percentile. Pressure at 7 cm with EPR of 3.     I first met him on 04/12/2013, at which time he complained of snoring and excessive daytime somnolence. I suggested he have a sleep study. He had a split-night sleep study on 05/28/2013 and went over his test results with him in detail today. His baseline sleep efficiency was reduced at 68.1% with a latency to sleep of 33 minutes and wake after sleep onset of 46.5 minutes with moderate to severe sleep fragmentation noted. His arousal index was mildly elevated. He had an increased percentage of stage I and stage II sleep, absence of slow-wave sleep, and a reduced percentage of REM sleep with a mildly prolonged REM latency. He had no significant EKG changes or periodic leg movements of sleep. He had moderate to loud snoring. He had a total AHI of 15.2 per hour, rising to 38 per hour in REM sleep. His baseline oxygen saturation was 95% with a nadir of 78% during REM sleep. He  was then titrated on CPAP. His sleep efficiency improved. His arousal index improved. His sleep architecture showed an increased percentage of stage II sleep, absence of slow-wave sleep and a reduced percentage of REM  sleep. Average oxygen saturation was 96%, nadir was 92%, greatly improved. He was titrated on CPAP from 5-7 cm with a reduction of his AHI to 0 per hour on the final pressure. He had REM sleep but no supine sleep was achieved during this part of the study. He indicated that he does not sleep on his back. He is status post multiple back operations. Based on the test results I prescribed CPAP for him.   I reviewed the patient's CPAP compliance data from 06/09/2013 to 07/08/2013, which is a total of 30 days, during which time the patient used CPAP every day except for 1 day. The average usage for all days was 7 hours and 44 minutes. The percent used days greater than 4 hours was 97 %, indicating excellent compliance. The residual AHI was 1.3 per hour, indicating an appropriate treatment pressure of 7 cwp with EPR of 3.     I reviewed his compliance data of with his machine: 06/10/2013 through 07/14/2013 which is a total of 35 days during which time use CPAP every night with a percent used days greater than 4 hours of 100%, indicating excellent compliance, average usage was 7 hours and 47 minutes, residual AHI low at 1.3 per hour and leak was low at 1.4. Pressure was 7 cm with EPR of 3.  His Past Medical History Is Significant For: Past Medical History:  Diagnosis Date   Abdominal aortic ectasia (HCC)    Arthritis    Cervical spondylosis    Colon polyps    CVA (cerebral infarction) 2010   Diabetes mellitus    Dizziness    DVT (deep venous thrombosis) (HCC)    bilateral   ED (erectile dysfunction)    Foot drop    H/O Bell's palsy    Hyperlipidemia    Hypertension    Obesity    OSA on CPAP    Raynauds syndrome    Restrictive lung disease    Spinal stenosis    Stroke ([place])    Unsteady gait     His Past Surgical History Is Significant For: Past Surgical History:  Procedure Laterality Date   CARPAL TUNNEL RELEASE Bilateral    CERVICAL DISCECTOMY  2004   LOOP RECORDER INSERTION N/A  06/06/2019   Procedure: LOOP RECORDER INSERTION;  Surgeon: [name], MD;  Location: [facility];  Service: Cardiovascular;  Laterality: N/A;   LUMBAR DISC SURGERY     x6   POSTERIOR CERVICAL FUSION/FORAMINOTOMY N/A 06/27/2016   Procedure: POSTERIOR CERVICAL FUSION C2-C5; C2-C4 Decompression;  Surgeon: Melina Schools, MD;  Location: [facility];  Service: Orthopedics;  Laterality: N/A;    His Family History Is Significant For: Family History  Problem Relation Age of Onset   Multiple sclerosis Sister    Dementia Mother    Diabetes Mother    Lung cancer Father    Sleep apnea Brother    Hypertension Brother    Colon cancer Neg Hx    Stomach cancer Neg Hx     His Social History Is Significant For: Social History   Socioeconomic History   Marital status: Married    Spouse name: Gwendolyn   Number of children: 2   Years of education: 14   Highest education level: Not on file  Occupational History  Occupation: RETIRED    Comment: retired  Tobacco Use   Smoking status: Former    Types: Cigarettes    Quit date: 01/21/2011    Years since quitting: 10.7   Smokeless tobacco: Never   Tobacco comments:    still smokes occasional cigars  Vaping Use   Vaping Use: Never used  Substance and Sexual Activity   Alcohol use: No   Drug use: No   Sexual activity: Not on file  Other Topics Concern   Not on file  Social History Narrative   Patient is right handed and resides with wife   Social Determinants of Health   Financial Resource Strain: Not on file  Food Insecurity: Not on file  Transportation Needs: Not on file  Physical Activity: Not on file  Stress: Not on file  Social Connections: Not on file    His Allergies Are:  Allergies  Allergen Reactions   Farxiga [Dapagliflozin] Other (See Comments)    Infection and kidney involvement   Losartan Potassium     Other reaction(s): sexual dysfunction/ED  :   His Current Medications Are:  Outpatient Encounter Medications  as of 10/07/2021  Medication Sig   ACCU-CHEK GUIDE test strip USE TO TEST BLOOD SUGAR DAILY   atorvastatin (LIPITOR) 40 MG tablet Take 0.5 tablets (20 mg total) by mouth at bedtime. (Patient taking differently: Take 40 mg by mouth at bedtime.)   blood glucose meter kit and supplies KIT Dispense based on patient and insurance preference. Use up to four times daily as directed. (FOR ICD-9 250.00, 250.01).   clopidogrel (PLAVIX) 75 MG tablet Take 75 mg by mouth daily.    cyclobenzaprine (FLEXERIL) 10 MG tablet Take 1 tablet (10 mg total) by mouth every 8 (eight) hours as needed for muscle spasms.   metFORMIN (GLUCOPHAGE) 1000 MG tablet Take 1,000 mg by mouth 2 (two) times daily with a meal.   oxyCODONE-acetaminophen (PERCOCET/ROXICET) 5-325 MG tablet Take 1 tablet by mouth 3 (three) times daily as needed.   pantoprazole (PROTONIX) 40 MG tablet Take 1 tablet (40 mg total) by mouth daily.   polyethylene glycol (MIRALAX / GLYCOLAX) packet Take 17 g by mouth 2 (two) times daily. (Patient taking differently: Take 17 g by mouth daily.)   sildenafil (REVATIO) 20 MG tablet Take 20-100 mg by mouth as needed (for erectile dysfunction).    sildenafil (VIAGRA) 100 MG tablet sildenafil 100 mg tablet  TAKE 1 TABLET BY MOUTH ONCE DAILY AS NEEDED   valsartan-hydrochlorothiazide (DIOVAN-HCT) 320-25 MG tablet Take 1 tablet by mouth daily.   [DISCONTINUED] meclizine (ANTIVERT) 25 MG tablet Take 25 mg by mouth 3 (three) times daily as needed for dizziness.   [DISCONTINUED] valsartan-hydrochlorothiazide (DIOVAN-HCT) 160-12.5 MG tablet Take 1 tablet by mouth daily.   No facility-administered encounter medications on file as of 10/07/2021.  :  Review of Systems:  Out of a complete 14 point review of systems, all are reviewed and negative with the exception of these symptoms as listed below:   Review of Systems  Neurological:        Here alone for follow-up. He has no complaints regarding his CPAP. He feels stable  stroke-wise. He is not currently doing any type of physical or occupational therapy.    Objective:  Neurological Exam  Physical Exam Physical Examination:   Vitals:   10/07/21 1313  BP: (!) 147/78  Pulse: 87    General Examination: The patient is a very pleasant 71 y.o. male in no acute distress.  He appears well-developed and well-nourished and well groomed.   HEENT: Normocephalic, atraumatic, pupils round and reactive to light, corrective eyeglasses in place. Hearing is grossly intact. Face is symmetric with normal facial animation. Speech is clear, no dysarthria noted and no hypophonia. There is no lip, neck/head, jaw or voice tremor. Neck with mildly decreased ROM.  Surgical scar posteriorly. Oropharynx exam reveals: no significant changes, but mild mouth dryness noted.    Chest: Clear to auscultation without wheezing, rhonchi or crackles noted.   Heart: S1+S2+0, regular and normal without murmurs, rubs or gallops noted.    Abdomen: Soft, non-tender and non-distended.   Extremities: There is no edema in the distal lower extremities bilaterally. R AFO in place for chronic R foot drop. His right leg is smaller in caliber than the L, unchanged. Mild flexion deformity, R>L of middle and ring fingers.   Skin: Warm and dry without trophic changes noted.    Musculoskeletal: exam reveals no other obvious joint deformities.   Neurologically:  Mental status: The patient is awake, alert and oriented in all 4 spheres. His memory, attention, language and knowledge are appropriate. There is no aphasia, agnosia, apraxia or anomia. Speech is clear with normal prosody and enunciation. Thought process is linear. Mood is congruent and affect is normal.  Cranial nerves are as described above under HEENT exam.  Motor exam: mild hand muscle wasting (seems stable), global strength of 4 out of 5. Slightly weaker on the right side, has right foot drop, has a brace around the right ankle (which I did not  remove for exam today). Romberg is not tested for safety reasons. Fine motor skills are mildly impaired globally.   Cerebellar testing shows no dysmetria or intention tremor. There is no truncal or gait ataxia.  Sensory exam is intact to light touch.  Gait, station and balance: His posture is fairly good, he walks with a single-point cane, no obvious limp, but does walk a little slowly and cautiously, stable overall.   Assessment and Plan:    In summary, Rodney Crane is a very pleasant 71 year old male with an underlying medical history of hypertension, prior smoking, hyperlipidemia, type 2 diabetes, degenerative back disease, s/p multiple back surgeris, TIAs, L left thalamic stroke, and b/l frontal lobe embolic strokes in 5093, and history of rectal abscess and sepsis in 2022, who presents for FU consultation of his Hx of stroke and sleep apnea. He had full stroke work-up in the hospital in February 2021, was started on Plavix and had been on a study drug as well, he finished this in February 2022.  He had significant side effects on Farxiga last year in 2022. He has seen GI for elevated liver enzymes in the past.  As far as his sleep apnea, he is compliant with CPAP and is highly commended for it.  We talked about the importance of secondary stroke prevention and maintaining a healthy lifestyle.  He is advised to follow-up routinely in sleep clinic in 1 year, sooner if needed.  I answered all his questions today and he was in agreement.  I spent 40 minutes in total face-to-face time and in reviewing records during pre-charting, more than 50% of which was spent in counseling and coordination of care, reviewing test results, reviewing medications and treatment regimen and/or in discussing or reviewing the diagnosis of OSA, the prognosis and treatment options. Pertinent laboratory and imaging test results that were available during this visit with the patient were reviewed by me and considered  in my medical  decision making (see chart for details).

## 2021-10-07 NOTE — Patient Instructions (Addendum)
It was nice to see you again today.  You are compliant with your CPAP as always.  Keep up the good work! We can see you in 1 year.  I am sorry to hear you had serious side effects from taking [place].  I am so glad to hear that things are better now!

## 2021-10-14 ENCOUNTER — Ambulatory Visit: Payer: Medicare Other | Admitting: Podiatry

## 2021-10-20 NOTE — Progress Notes (Signed)
[facility] Summary Report / Loop Recorder 

## 2021-10-22 DIAGNOSIS — I10 Essential (primary) hypertension: Secondary | ICD-10-CM | POA: Diagnosis not present

## 2021-10-22 DIAGNOSIS — E1129 Type 2 diabetes mellitus with other diabetic kidney complication: Secondary | ICD-10-CM | POA: Diagnosis not present

## 2021-10-22 DIAGNOSIS — E785 Hyperlipidemia, unspecified: Secondary | ICD-10-CM | POA: Diagnosis not present

## 2021-10-27 DIAGNOSIS — E1140 Type 2 diabetes mellitus with diabetic neuropathy, unspecified: Secondary | ICD-10-CM | POA: Diagnosis not present

## 2021-10-27 DIAGNOSIS — Z125 Encounter for screening for malignant neoplasm of prostate: Secondary | ICD-10-CM | POA: Diagnosis not present

## 2021-10-27 DIAGNOSIS — E785 Hyperlipidemia, unspecified: Secondary | ICD-10-CM | POA: Diagnosis not present

## 2021-10-27 DIAGNOSIS — I10 Essential (primary) hypertension: Secondary | ICD-10-CM | POA: Diagnosis not present

## 2021-10-27 DIAGNOSIS — D649 Anemia, unspecified: Secondary | ICD-10-CM | POA: Diagnosis not present

## 2021-11-03 DIAGNOSIS — I10 Essential (primary) hypertension: Secondary | ICD-10-CM | POA: Diagnosis not present

## 2021-11-03 DIAGNOSIS — Z0000 Encounter for general adult medical examination without abnormal findings: Secondary | ICD-10-CM | POA: Diagnosis not present

## 2021-11-03 DIAGNOSIS — D649 Anemia, unspecified: Secondary | ICD-10-CM | POA: Diagnosis not present

## 2021-11-03 DIAGNOSIS — E1129 Type 2 diabetes mellitus with other diabetic kidney complication: Secondary | ICD-10-CM | POA: Diagnosis not present

## 2021-11-03 DIAGNOSIS — Z1389 Encounter for screening for other disorder: Secondary | ICD-10-CM | POA: Diagnosis not present

## 2021-11-03 DIAGNOSIS — G4733 Obstructive sleep apnea (adult) (pediatric): Secondary | ICD-10-CM | POA: Diagnosis not present

## 2021-11-03 DIAGNOSIS — R748 Abnormal levels of other serum enzymes: Secondary | ICD-10-CM | POA: Diagnosis not present

## 2021-11-03 DIAGNOSIS — E1140 Type 2 diabetes mellitus with diabetic neuropathy, unspecified: Secondary | ICD-10-CM | POA: Diagnosis not present

## 2021-11-03 DIAGNOSIS — G8191 Hemiplegia, unspecified affecting right dominant side: Secondary | ICD-10-CM | POA: Diagnosis not present

## 2021-11-03 DIAGNOSIS — N400 Enlarged prostate without lower urinary tract symptoms: Secondary | ICD-10-CM | POA: Diagnosis not present

## 2021-11-03 DIAGNOSIS — M4712 Other spondylosis with myelopathy, cervical region: Secondary | ICD-10-CM | POA: Diagnosis not present

## 2021-11-03 DIAGNOSIS — E785 Hyperlipidemia, unspecified: Secondary | ICD-10-CM | POA: Diagnosis not present

## 2021-11-03 DIAGNOSIS — Z1331 Encounter for screening for depression: Secondary | ICD-10-CM | POA: Diagnosis not present

## 2021-11-03 DIAGNOSIS — R82998 Other abnormal findings in urine: Secondary | ICD-10-CM | POA: Diagnosis not present

## 2021-11-04 LAB — CUP PACEART REMOTE DEVICE CHECK
Date Time Interrogation Session: 20230709232054
Implantable Pulse Generator Implant Date: 20210211

## 2021-11-08 ENCOUNTER — Ambulatory Visit (INDEPENDENT_AMBULATORY_CARE_PROVIDER_SITE_OTHER): Payer: Medicare Other

## 2021-11-08 DIAGNOSIS — I63413 Cerebral infarction due to embolism of bilateral middle cerebral arteries: Secondary | ICD-10-CM

## 2021-11-11 ENCOUNTER — Ambulatory Visit (INDEPENDENT_AMBULATORY_CARE_PROVIDER_SITE_OTHER): Payer: Medicare Other | Admitting: Podiatry

## 2021-11-11 ENCOUNTER — Encounter: Payer: Self-pay | Admitting: Podiatry

## 2021-11-11 DIAGNOSIS — B351 Tinea unguium: Secondary | ICD-10-CM

## 2021-11-11 DIAGNOSIS — M79675 Pain in left toe(s): Secondary | ICD-10-CM | POA: Diagnosis not present

## 2021-11-11 DIAGNOSIS — E1142 Type 2 diabetes mellitus with diabetic polyneuropathy: Secondary | ICD-10-CM

## 2021-11-11 DIAGNOSIS — M79674 Pain in right toe(s): Secondary | ICD-10-CM | POA: Diagnosis not present

## 2021-11-11 NOTE — Progress Notes (Signed)
This patient returns to my office for at risk foot care.  This patient requires this care by a professional since this patient will be at risk due to having type 2 diabetes and CVA.  This patient is unable to cut nails himself since the patient cannot reach his nails.These nails are painful walking and wearing shoes.  This patient presents for at risk foot care today.  General Appearance  Alert, conversant and in no acute stress.  Vascular  Dorsalis pedis and posterior tibial  pulses are palpable  bilaterally.  Capillary return is within normal limits  bilaterally. Temperature is within normal limits  bilaterally.  Neurologic  Senn-Weinstein monofilament wire test diminished   bilaterally. Muscle power within normal limits bilaterally.  Nails Thick disfigured discolored nails with subungual debris  from hallux to fifth toes bilaterally. No evidence of bacterial infection or drainage bilaterally.  Orthopedic  No limitations of motion  feet .  No crepitus or effusions noted.  No bony pathology or digital deformities noted.  Skin  normotropic skin with no porokeratosis noted bilaterally.  No signs of infections or ulcers noted.   Asymptomatic heel callus  Onychomycosis  Pain in right toes  Pain in left toes  Consent was obtained for treatment procedures.   Mechanical debridement of nails 1-5  bilaterally performed with a nail nipper.  Filed with dremel without incident.   Filed heel callus with dremel.   Return office visit    3 months                 Told patient to return for periodic foot care and evaluation due to potential at risk complications.   Avrom Robarts DPM  

## 2021-11-22 DIAGNOSIS — I10 Essential (primary) hypertension: Secondary | ICD-10-CM | POA: Diagnosis not present

## 2021-11-22 DIAGNOSIS — E785 Hyperlipidemia, unspecified: Secondary | ICD-10-CM | POA: Diagnosis not present

## 2021-11-22 DIAGNOSIS — E1129 Type 2 diabetes mellitus with other diabetic kidney complication: Secondary | ICD-10-CM | POA: Diagnosis not present

## 2021-11-23 DIAGNOSIS — Z5181 Encounter for therapeutic drug level monitoring: Secondary | ICD-10-CM | POA: Diagnosis not present

## 2021-11-23 DIAGNOSIS — Z79899 Other long term (current) drug therapy: Secondary | ICD-10-CM | POA: Diagnosis not present

## 2021-11-23 DIAGNOSIS — Z79891 Long term (current) use of opiate analgesic: Secondary | ICD-10-CM | POA: Diagnosis not present

## 2021-12-01 DIAGNOSIS — L02215 Cutaneous abscess of perineum: Secondary | ICD-10-CM | POA: Diagnosis not present

## 2021-12-01 DIAGNOSIS — I10 Essential (primary) hypertension: Secondary | ICD-10-CM | POA: Diagnosis not present

## 2021-12-01 DIAGNOSIS — D649 Anemia, unspecified: Secondary | ICD-10-CM | POA: Diagnosis not present

## 2021-12-02 DIAGNOSIS — K610 Anal abscess: Secondary | ICD-10-CM | POA: Insufficient documentation

## 2021-12-07 ENCOUNTER — Other Ambulatory Visit (HOSPITAL_COMMUNITY): Payer: Self-pay | Admitting: *Deleted

## 2021-12-08 ENCOUNTER — Ambulatory Visit (HOSPITAL_COMMUNITY)
Admission: RE | Admit: 2021-12-08 | Discharge: 2021-12-08 | Disposition: A | Discharge status: Home / Self Care | Payer: Medicare Other | Source: Ambulatory Visit | Attending: Internal Medicine | Admitting: Internal Medicine

## 2021-12-08 DIAGNOSIS — D649 Anemia, unspecified: Secondary | ICD-10-CM | POA: Diagnosis not present

## 2021-12-08 MED ORDER — SODIUM CHLORIDE 0.9 % IV SOLN
510.0000 mg | INTRAVENOUS | Status: DC
  Administered 2021-12-08: 510 mg via INTRAVENOUS
  Filled 2021-12-08: qty 510

## 2021-12-08 NOTE — Progress Notes (Signed)
[facility] Summary Report / Loop Recorder 

## 2021-12-09 LAB — CUP PACEART REMOTE DEVICE CHECK
Date Time Interrogation Session: 20230811230850
Implantable Pulse Generator Implant Date: 20210211

## 2021-12-13 ENCOUNTER — Ambulatory Visit (INDEPENDENT_AMBULATORY_CARE_PROVIDER_SITE_OTHER): Payer: Medicare Other

## 2021-12-13 DIAGNOSIS — I63413 Cerebral infarction due to embolism of bilateral middle cerebral arteries: Secondary | ICD-10-CM | POA: Diagnosis not present

## 2021-12-14 ENCOUNTER — Other Ambulatory Visit (HOSPITAL_COMMUNITY): Payer: Self-pay

## 2021-12-15 ENCOUNTER — Encounter (HOSPITAL_COMMUNITY)
Admission: RE | Admit: 2021-12-15 | Discharge: 2021-12-15 | Disposition: A | Discharge status: Home / Self Care | Payer: Medicare Other | Source: Ambulatory Visit | Attending: Internal Medicine | Admitting: Internal Medicine

## 2021-12-15 DIAGNOSIS — D509 Iron deficiency anemia, unspecified: Secondary | ICD-10-CM | POA: Diagnosis present

## 2021-12-15 DIAGNOSIS — D649 Anemia, unspecified: Secondary | ICD-10-CM | POA: Insufficient documentation

## 2021-12-15 MED ORDER — SODIUM CHLORIDE 0.9 % IV SOLN
510.0000 mg | INTRAVENOUS | Status: DC
  Administered 2021-12-15: 510 mg via INTRAVENOUS
  Filled 2021-12-15: qty 510

## 2021-12-22 ENCOUNTER — Other Ambulatory Visit (INDEPENDENT_AMBULATORY_CARE_PROVIDER_SITE_OTHER): Payer: Medicare Other

## 2021-12-22 ENCOUNTER — Encounter: Payer: Self-pay | Admitting: Physician Assistant

## 2021-12-22 ENCOUNTER — Ambulatory Visit (INDEPENDENT_AMBULATORY_CARE_PROVIDER_SITE_OTHER): Payer: Medicare Other | Admitting: Physician Assistant

## 2021-12-22 VITALS — BP 110/70 | HR 74 | Ht 75.0 in | Wt 255.8 lb

## 2021-12-22 DIAGNOSIS — D509 Iron deficiency anemia, unspecified: Secondary | ICD-10-CM

## 2021-12-22 DIAGNOSIS — Z86010 Personal history of colonic polyps: Secondary | ICD-10-CM | POA: Diagnosis not present

## 2021-12-22 DIAGNOSIS — R195 Other fecal abnormalities: Secondary | ICD-10-CM

## 2021-12-22 DIAGNOSIS — R748 Abnormal levels of other serum enzymes: Secondary | ICD-10-CM | POA: Diagnosis not present

## 2021-12-22 LAB — CBC WITH DIFFERENTIAL/PLATELET
Basophils Absolute: 0 10*3/uL (ref 0.0–0.1)
Basophils Relative: 0.6 % (ref 0.0–3.0)
Eosinophils Absolute: 0.4 10*3/uL (ref 0.0–0.7)
Eosinophils Relative: 6 % — ABNORMAL HIGH (ref 0.0–5.0)
HCT: 28.1 % — ABNORMAL LOW (ref 39.0–52.0)
Hemoglobin: 9.3 g/dL — ABNORMAL LOW (ref 13.0–17.0)
Lymphocytes Relative: 23.7 % (ref 12.0–46.0)
Lymphs Abs: 1.6 10*3/uL (ref 0.7–4.0)
MCHC: 33 g/dL (ref 30.0–36.0)
MCV: 87.7 fl (ref 78.0–100.0)
Monocytes Absolute: 1 10*3/uL (ref 0.1–1.0)
Monocytes Relative: 13.9 % — ABNORMAL HIGH (ref 3.0–12.0)
Neutro Abs: 3.8 10*3/uL (ref 1.4–7.7)
Neutrophils Relative %: 55.8 % (ref 43.0–77.0)
Platelets: 260 10*3/uL (ref 150.0–400.0)
RBC: 3.2 Mil/uL — ABNORMAL LOW (ref 4.22–5.81)
RDW: 16.3 % — ABNORMAL HIGH (ref 11.5–15.5)
WBC: 6.9 10*3/uL (ref 4.0–10.5)

## 2021-12-22 LAB — HEPATIC FUNCTION PANEL
ALT: 23 U/L (ref 0–53)
AST: 27 U/L (ref 0–37)
Albumin: 4.1 g/dL (ref 3.5–5.2)
Alkaline Phosphatase: 121 U/L — ABNORMAL HIGH (ref 39–117)
Bilirubin, Direct: 0.1 mg/dL (ref 0.0–0.3)
Total Bilirubin: 0.4 mg/dL (ref 0.2–1.2)
Total Protein: 8.3 g/dL (ref 6.0–8.3)

## 2021-12-22 MED ORDER — NA SULFATE-K SULFATE-MG SULF 17.5-3.13-1.6 GM/177ML PO SOLN: 1.0000 | Freq: Once | ORAL | 0 refills | Status: AC

## 2021-12-22 NOTE — Progress Notes (Signed)
Noted  

## 2021-12-22 NOTE — Patient Instructions (Signed)
If you are age 71 or older, your body mass index should be between 23-30. Your Body mass index is 31.97 kg/m. If this is out of the aforementioned range listed, please consider follow up with your Primary Care Provider. ________________________________________________________  The [facility] providers would like to encourage you to use [facility] to communicate with providers for non-urgent requests or questions.  Due to long hold times on the telephone, sending your provider a message by [facility] may be a faster and more efficient way to get a response.  Please allow 48 business hours for a response.  Please remember that this is for non-urgent requests.  _______________________________________________________  Rodney Crane have been scheduled for an endoscopy and colonoscopy. Please follow the written instructions given to you at your visit today. Please pick up your prep supplies at the pharmacy within the next 1-3 days. If you use inhalers (even only as needed), please bring them with you on the day of your procedure.  Your provider has requested that you go to the basement level for lab work before leaving today. Press "B" on the elevator. The lab is located at the first door on the left as you exit the elevator.  You will be contacted by our office prior to your procedure for directions on holding your Plavix.  If you do not hear from our office 1 week prior to your scheduled procedure, please call [phone] to discuss.   Follow up pending the results of your Colonoscopy/Endoscopy  Thank you for entrusting me with your care and choosing [facility].  Amy Esterwood, PA-C

## 2021-12-22 NOTE — Progress Notes (Signed)
Subjective:    Patient ID: Rodney Crane, male    DOB: 02/09/1951, 71 y.o.   MRN: [id number]  HPI Rodney Crane is a pleasant 71 year old African-American male, established with Dr. Henrene Pastor, and known from prior colonoscopies with history of adenomatous colon polyps and GERD. He is referred back today by Dr. Marton Redwood with Hemoccult positive stool, and also has iron deficiency anemia. Patient had last undergone colonoscopy in August 2020 for follow-up of adenomatous polyps.  He had 5 polyps removed all 2 to 7 mm in size and noted internal hemorrhoids.  Path on the polyps-all were consistent with tubular adenomas and indicated for 5-year interval follow-up.  He has not had prior EGD.  Patient also had work-up for elevated alkaline phosphatase was determined to be of hepatic etiology.  He underwent MRI/MRCP in August 2021 with finding of multiple small gallstones, no ductal dilation noted, does have pancreas divisum and had a normal-appearing liver.  We were able to obtain copies of some recent labs from [place].  Patient relates that he was initially diagnosed with iron deficiency anemia last fall and had undergone 2 iron infusions. He and his wife state that he had a Hemoccult in March 2023 which was positive and then had a recent Hemoccult on 11/23/2021 positive x3. He says his iron deficiency has also worsened again recently and he had had a drop in hemoglobin. He is not noticed any hematochezia, has noticed some dark stools at times but no overt melena.  He feels as if his stool was darker after the iron infusions.  He says he was unable to tolerate oral iron due to severe constipation, does take MiraLAX daily.  Has just recently completed 2 more iron infusions. He has no current complaints of abdominal pain, no recent changes in bowel habits, no complaints of heartburn or indigestion, no dysphagia.  He is maintained on Protonix 40 mg p.o. daily.  He also had required an I&D of a perianal  abscess in the fall 2022. He had been started on Farxiga at some point after that interval and then developed another perianal abscess which was drained in the office per Dr. Georgette Dover on 12/02/2021.  He has been off [place].  He is maintained on chronic Plavix with prior history of CVA  Review of labs show hemoglobin from September 2022 of 8.6/hematocrit 29.0/MCV 79.2, at that time LFTs were normal/alk phos 126 , Ferritin 8.4/iron 20/iron saturation 4/TIBC 481. Labs November 2022 hemoglobin 8.1/hematocrit 25.7/MCV 77 January 2023 hemoglobin 11.8/hematocrit 35.6/MCV 84  July 2023 alk phos 205 other LFTs within normal limits WBC 6.26/hemoglobin 9.7/hematocrit 29.3/MCV of 90 Iron 26/TIBC 413/ferritin 16  Labs 12/01/2021 hemoglobin 8.5/hematocrit 26.1/MCV 87/platelets 322  Other medical issues include the prior CVA with residual right-sided weakness, prior history of DVT, sleep apnea with CPAP use no oxygen, adult onset diabetes mellitus, chronic kidney disease, polyneuropathy, obesity.  Review of Systems. Pertinent positive and negative review of systems were noted in the above HPI section.  All other review of systems was otherwise negative.   Outpatient Encounter Medications as of 12/22/2021  Medication Sig   ACCU-CHEK GUIDE test strip USE TO TEST BLOOD SUGAR DAILY   amLODipine (NORVASC) 5 MG tablet 1 tablet Orally Once a day for 30 days   atorvastatin (LIPITOR) 40 MG tablet Take 0.5 tablets (20 mg total) by mouth at bedtime. (Patient taking differently: Take 40 mg by mouth at bedtime.)   blood glucose meter kit and supplies KIT Dispense based on patient and  insurance preference. Use up to four times daily as directed. (FOR ICD-9 250.00, 250.01).   clopidogrel (PLAVIX) 75 MG tablet Take 75 mg by mouth daily.    cyclobenzaprine (FLEXERIL) 10 MG tablet Take 1 tablet (10 mg total) by mouth every 8 (eight) hours as needed for muscle spasms.   metFORMIN (GLUCOPHAGE) 1000 MG tablet Take 1,000 mg by  mouth 2 (two) times daily with a meal.   Na Sulfate-K Sulfate-Mg Sulf 17.5-3.13-1.6 GM/177ML SOLN Take 1 kit by mouth once for 1 dose.   oxyCODONE-acetaminophen (PERCOCET/ROXICET) 5-325 MG tablet Take 1 tablet by mouth 3 (three) times daily as needed.   pantoprazole (PROTONIX) 40 MG tablet Take 1 tablet (40 mg total) by mouth daily.   polyethylene glycol (MIRALAX / GLYCOLAX) packet Take 17 g by mouth 2 (two) times daily. (Patient taking differently: Take 17 g by mouth daily.)   Probiotic Product ([place]) Take by mouth.   sildenafil (VIAGRA) 100 MG tablet sildenafil 100 mg tablet  TAKE 1 TABLET BY MOUTH ONCE DAILY AS NEEDED   valsartan-hydrochlorothiazide (DIOVAN-HCT) 320-25 MG tablet Take 1 tablet by mouth daily.   [DISCONTINUED] sildenafil (REVATIO) 20 MG tablet Take 20-100 mg by mouth as needed (for erectile dysfunction).    No facility-administered encounter medications on file as of 12/22/2021.   Allergies  Allergen Reactions   Farxiga [Dapagliflozin] Other (See Comments)    Infection and kidney involvement   Losartan Potassium     Other reaction(s): sexual dysfunction/ED   Patient Active Problem List   Diagnosis Date Noted   Alkaline phosphatase raised 05/13/2021   Anemia 05/13/2021   Diabetic peripheral neuropathy associated with type 2 diabetes mellitus ([place]) 05/13/2021   Increased band cell count 05/13/2021   Morbid obesity ([place]) 05/13/2021   Right hemiparesis ([place]) 05/13/2021   Carpal tunnel syndrome of left wrist 10/23/2019   Acquired trigger finger of right index finger 08/27/2019   Gastro-esophageal reflux disease without esophagitis 06/13/2019   CVA (cerebral vascular accident) ([place]) 06/05/2019   Pain due to onychomycosis of toenails of both feet 10/25/2018   Raynaud's disease 06/27/2018   Cervical post-laminectomy syndrome 06/11/2018   Chronic pain syndrome 06/11/2018   Degeneration of lumbar intervertebral disc 06/11/2018   Abdominal aortic ectasia  ([place]) 02/21/2017   History of arthrodesis 08/02/2016   History of thromboembolism of vein 08/02/2016   Acute deep vein thrombosis (DVT) of distal vein of both lower extremities ([place]) 07/07/2016   Abdominal distention    Abdominal pain    S/P cervical spinal fusion    Type 2 diabetes mellitus with peripheral neuropathy (HCC)    Hyperlipidemia    Neurogenic bowel    Myelopathy (HCC)    OSA on CPAP    Surgery, elective    History of CVA (cerebrovascular accident)    Benign essential HTN    History of cervical spinal surgery    History of lumbar surgery    Tobacco abuse    Diastolic dysfunction    Acute blood loss anemia    Post-operative pain    Tachycardia    Leukocytosis    Spinal stenosis of lumbar region    Central cord syndrome ([place])    Acute hypoxemic respiratory failure ([place])    Spinal stenosis in cervical region 06/27/2016   Cervical stenosis of spinal canal 06/25/2016   Weakness of extremity 06/25/2016   OSA (obstructive sleep apnea)    Hypertension    Diabetes ([place])    Benign prostatic hyperplasia without lower  urinary tract symptoms 01/21/2016   Diabetic renal disease ([place]) 01/21/2016   Abnormal gait 12/16/2014   Foot-drop 12/16/2014   Low back pain 01/17/2011   DYSPNEA ON EXERTION 10/06/2009   Nonspecific (abnormal) findings on radiological and other examination of body structure 10/06/2009   CT, CHEST, ABNORMAL 10/06/2009   COLONIC POLYPS 10/05/2009   DM (diabetes mellitus), type 2 with renal complications ([place]) 18/84/1660   Type 2 diabetes mellitus with hyperlipidemia ([place]) 10/05/2009   BELL'S PALSY 10/05/2009   Essential hypertension 10/05/2009   CVA (cerebrovascular accident) ([place]) 10/05/2009   ERECTILE DYSFUNCTION, 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/2009   Polyneuropathy 07/06/2009   Social History   Socioeconomic History   Marital status: Married    Spouse name: Sales executive   Number of children: 2   Years of education: 14   Highest education level: Not on file   Occupational History   Occupation: RETIRED    Comment: retired  Tobacco Use   Smoking status: Former    Types: Cigarettes    Quit date: 01/21/2011    Years since quitting: 10.9   Smokeless tobacco: Never   Tobacco comments:    still smokes occasional cigars  Vaping Use   Vaping Use: Never used  Substance and Sexual Activity   Alcohol use: No   Drug use: No   Sexual activity: Not on file  Other Topics Concern   Not on file  Social History Narrative   Patient is right handed and resides with wife   Social Determinants of Health   Financial Resource Strain: Not on file  Food Insecurity: Not on file  Transportation Needs: Not on file  Physical Activity: Not on file  Stress: Not on file  Social Connections: Not on file  Intimate Partner Violence: Not on file    Rodney Crane's family history includes Dementia in his mother; Diabetes in his mother; Hypertension in his brother; Lung cancer in his father; Multiple sclerosis in his sister; Sleep apnea in his brother.      Objective:    Vitals:   12/22/21 1415  BP: 110/70  Pulse: 74    Physical Exam Well-developed well-nourished older African-American male no acute distress.  Patient ambulates with a cane.  Accompanied by his wife   height, Weight, 255 BMI 31.9  HEENT; nontraumatic normocephalic, EOMI, PE R LA, sclera anicteric. Oropharynx; not examined today Neck; supple, no JVD Cardiovascular; regular rate and rhythm with S1-S2, no murmur rub or gallop Pulmonary; Clear bilaterally Abdomen; soft, nontender, nondistended, no palpable mass or hepatosplenomegaly, bowel sounds are active Rectal; not done today Skin; benign exam, no jaundice rash or appreciable lesions Extremities; no clubbing cyanosis or edema skin warm and dry Neuro/Psych; alert and oriented x4, grossly nonfocal mood and affect appropriate        Assessment & Plan:   #11 71 year old African-American male with persistent iron deficiency anemia and  Hemoccult positive stool in the setting of chronic Plavix Initial diagnosis of iron deficiency anemia fall 2022, underwent iron infusions with improvement in parameters, then labs July 2023 revealed recurrence of the iron deficiency and anemia with drift in hemoglobin and Hemoccults were positive x3. Patient has not had any overt bleeding, has noticed some darker stools on occasion, no other current GI symptoms.  Etiology of the iron deficiency anemia and Hemoccult positive stool is not clear, suspect chronic occult GI blood loss, especially in setting of antiplatelet therapy. Rule out upper versus lower GI sources, chronic gastropathy, AVMs, Cameron erosions, occult neoplasm,  #2 history  of adenomatous colon polyps-last colonoscopy August 2020 with removal of 5 tubular adenomatous polyps and plan was for 5-year interval follow-up  #3 history of CVA with residual right-sided weakness, on chronic Plavix #4 antiplatelet therapy chronic on Plavix_0 #5.  GERD stable on Protonix #6 sleep apnea with CPAP use no oxygen #7.  Adult onset diabetes mellitus #8.  History of polyneuropathy #9.  Hypertension #10 history of elevated alkaline phosphatase, of hepatic etiology, prior work-up has been negative, patient did have mildly elevated ANA and ASMA 2021. MRI/MRCP 11/2019 showed a normal-appearing liver, no ductal dilation, and multiple very small gallstones. Subsequent alk phos had normalized but most recent again mildly elevated at 205  Plan; hepatic panel today, repeat CBC today Patient will be scheduled for colonoscopy and upper endoscopy with Dr. Henrene Pastor.  Both procedures were discussed in detail with the patient including indications risk and benefits and he is agreeable to proceed. He will need to hold Plavix for 5 days prior to procedure, we will communicate with his PCP Dr. Marton Redwood to assure that holding Plavix is reasonable for this patient.  We briefly discussed capsule endoscopy should EGD  and colonoscopy be unrevealing as to source of the iron deficiency anemia.  Further recommendations pending results of above.    Alfredia Ferguson PA-C 12/22/2021   Cc: Ginger Organ., MD

## 2022-01-10 NOTE — Progress Notes (Signed)
[facility] Summary Report / Loop Recorder 

## 2022-01-17 ENCOUNTER — Ambulatory Visit (INDEPENDENT_AMBULATORY_CARE_PROVIDER_SITE_OTHER): Payer: Medicare Other

## 2022-01-17 DIAGNOSIS — Z961 Presence of intraocular lens: Secondary | ICD-10-CM | POA: Diagnosis not present

## 2022-01-17 DIAGNOSIS — H25812 Combined forms of age-related cataract, left eye: Secondary | ICD-10-CM | POA: Diagnosis not present

## 2022-01-17 DIAGNOSIS — I639 Cerebral infarction, unspecified: Secondary | ICD-10-CM | POA: Diagnosis not present

## 2022-01-17 DIAGNOSIS — H40013 Open angle with borderline findings, low risk, bilateral: Secondary | ICD-10-CM | POA: Diagnosis not present

## 2022-01-17 DIAGNOSIS — E119 Type 2 diabetes mellitus without complications: Secondary | ICD-10-CM | POA: Diagnosis not present

## 2022-01-18 LAB — CUP PACEART REMOTE DEVICE CHECK
Date Time Interrogation Session: 20230924231337
Implantable Pulse Generator Implant Date: 20210211

## 2022-01-20 ENCOUNTER — Ambulatory Visit (AMBULATORY_SURGERY_CENTER): Payer: Medicare Other | Admitting: Internal Medicine

## 2022-01-20 ENCOUNTER — Encounter: Payer: Self-pay | Admitting: Internal Medicine

## 2022-01-20 VITALS — BP 110/65 | HR 68 | Temp 98.0°F | Resp 18 | Ht 75.0 in | Wt 255.0 lb

## 2022-01-20 DIAGNOSIS — K5520 Angiodysplasia of colon without hemorrhage: Secondary | ICD-10-CM | POA: Diagnosis not present

## 2022-01-20 DIAGNOSIS — K648 Other hemorrhoids: Secondary | ICD-10-CM | POA: Diagnosis not present

## 2022-01-20 DIAGNOSIS — R748 Abnormal levels of other serum enzymes: Secondary | ICD-10-CM

## 2022-01-20 DIAGNOSIS — D132 Benign neoplasm of duodenum: Secondary | ICD-10-CM

## 2022-01-20 DIAGNOSIS — R195 Other fecal abnormalities: Secondary | ICD-10-CM | POA: Diagnosis not present

## 2022-01-20 DIAGNOSIS — K31819 Angiodysplasia of stomach and duodenum without bleeding: Secondary | ICD-10-CM

## 2022-01-20 DIAGNOSIS — D509 Iron deficiency anemia, unspecified: Secondary | ICD-10-CM

## 2022-01-20 DIAGNOSIS — K31A0 Gastric intestinal metaplasia, unspecified: Secondary | ICD-10-CM | POA: Diagnosis not present

## 2022-01-20 DIAGNOSIS — D124 Benign neoplasm of descending colon: Secondary | ICD-10-CM | POA: Diagnosis not present

## 2022-01-20 DIAGNOSIS — D122 Benign neoplasm of ascending colon: Secondary | ICD-10-CM

## 2022-01-20 DIAGNOSIS — D123 Benign neoplasm of transverse colon: Secondary | ICD-10-CM | POA: Diagnosis not present

## 2022-01-20 DIAGNOSIS — Z86010 Personal history of colonic polyps: Secondary | ICD-10-CM

## 2022-01-20 MED ORDER — SODIUM CHLORIDE 0.9 % IV SOLN: 500.0000 mL | Freq: Once | INTRAVENOUS | Status: DC

## 2022-01-20 NOTE — Op Note (Signed)
[facility] Patient Name: Rodney Crane Procedure Date: 01/20/2022 3:07 PM MRN: [id number] Endoscopist: Docia Chuck. Henrene Pastor , MD Age: 71 Referring MD:  Date of Birth: [birth year] Gender: Male Account #: [id number] Procedure:                Upper GI endoscopy with biopsies Indications:              Iron deficiency anemia, Heme positive stool Medicines:                Monitored Anesthesia Care Procedure:                Pre-Anesthesia Assessment:                           - Prior to the procedure, a History and Physical                            was performed, and patient medications and                            allergies were reviewed. The patient's tolerance of                            previous anesthesia was also reviewed. The risks                            and benefits of the procedure and the sedation                            options and risks were discussed with the patient.                            All questions were answered, and informed consent                            was obtained. Prior Anticoagulants: The patient has                            taken Plavix (clopidogrel), last dose was 7 days                            prior to procedure. ASA Grade Assessment: III - A                            patient with severe systemic disease. After                            reviewing the risks and benefits, the patient was                            deemed in satisfactory condition to undergo the                            procedure.  After obtaining informed consent, the endoscope was                            passed under direct vision. Throughout the                            procedure, the patient's blood pressure, pulse, and                            oxygen saturations were monitored continuously. The                            GIF [id number] was introduced through the                            mouth, and advanced to the third part  of duodenum.                            The upper GI endoscopy was accomplished without                            difficulty. The patient tolerated the procedure                            well. Scope In: Scope Out: Findings:                 The esophagus was normal.                           The stomach revealed a small hiatal hernia but was                            otherwise normal.                           The examined duodenum revealed a small polyp in the                            bulb. Biopsies were taken with a cold forceps for                            histology. The third duodenum had a nonbleeding AVM                           The cardia and gastric fundus were normal on                            retroflexion. Complications:            No immediate complications. Estimated Blood Loss:     Estimated blood loss: none. Impression:               1. Small benign duodenal polyp, biopsy  2. Duodenal AVM. This is the most likely source for                            iron deficiency anemia and Hemoccult positive stool.                           3. Small hiatal hernia. Otherwise normal EGD. Recommendation:           1. Continue oral iron therapy daily. You will need                            iron indefinitely to maintain your hemoglobin                           2. Contact Dr. Brigitte Pulse as I suspect that you will need                            an IV iron infusion as well as have your blood                            counts and iron levels monitored.                           3. Await pathology results.                           4. Resume Plavix (clopidogrel) at prior dose today. Docia Chuck. Henrene Pastor, MD 01/20/2022 4:08:30 PM This report has been signed electronically.

## 2022-01-20 NOTE — Progress Notes (Signed)
Vss nad trans to pacu °

## 2022-01-20 NOTE — Patient Instructions (Addendum)
Handout on polyps and hemorrhoids given to patient. Await pathology results. Recommended to continue oral iron therapy daily - you'll need this indefinitely to maintain your hemoglobin levels Contact Dr. Brigitte Pulse - will most likely need IV iron infusion as well as having your blood counts and iron levels monitored  Resume plavix (clopidogrel) at prior dose today  Repeat colonoscopy in 5 years for surveillance.   YOU HAD AN ENDOSCOPIC PROCEDURE TODAY AT [facility]:   Refer to the procedure report that was given to you for any specific questions about what was found during the examination.  If the procedure report does not answer your questions, please call your gastroenterologist to clarify.  If you requested that your care partner not be given the details of your procedure findings, then the procedure report has been included in a sealed envelope for you to review at your convenience later.  YOU SHOULD EXPECT: Some feelings of bloating in the abdomen. Passage of more gas than usual.  Walking can help get rid of the air that was put into your GI tract during the procedure and reduce the bloating. If you had a lower endoscopy (such as a colonoscopy or flexible sigmoidoscopy) you may notice spotting of blood in your stool or on the toilet paper. If you underwent a bowel prep for your procedure, you may not have a normal bowel movement for a few days.  Please Note:  You might notice some irritation and congestion in your nose or some drainage.  This is from the oxygen used during your procedure.  There is no need for concern and it should clear up in a day or so.  SYMPTOMS TO REPORT IMMEDIATELY:  Following lower endoscopy (colonoscopy or flexible sigmoidoscopy):  Excessive amounts of blood in the stool  Significant tenderness or worsening of abdominal pains  Swelling of the abdomen that is new, acute  Fever of 100F or higher  Following upper endoscopy (EGD)  Vomiting of blood or  coffee ground material  New chest pain or pain under the shoulder blades  Painful or persistently difficult swallowing  New shortness of breath  Fever of 100F or higher  Black, tarry-looking stools  For urgent or emergent issues, a gastroenterologist can be reached at any hour by calling [phone]. Do not use MyChart messaging for urgent concerns.    DIET:  We do recommend a small meal at first, but then you may proceed to your regular diet.  Drink plenty of fluids but you should avoid alcoholic beverages for 24 hours.  ACTIVITY:  You should plan to take it easy for the rest of today and you should NOT DRIVE or use heavy machinery until tomorrow (because of the sedation medicines used during the test).    FOLLOW UP: Our staff will call the number listed on your records the next business day following your procedure.  We will call around 7:15- 8:00 am to check on you and address any questions or concerns that you may have regarding the information given to you following your procedure. If we do not reach you, we will leave a message.     If any biopsies were taken you will be contacted by phone or by letter within the next 1-3 weeks.  Please call us at [phone] if you have not heard about the biopsies in 3 weeks.    SIGNATURES/CONFIDENTIALITY: You and/or your care partner have signed paperwork which will be entered into your electronic medical record.  These  signatures attest to the fact that that the information above on your After Visit Summary has been reviewed and is understood.  Full responsibility of the confidentiality of this discharge information lies with you and/or your care-partner.

## 2022-01-20 NOTE — Progress Notes (Signed)
Expand All Collapse All    Subjective:      Subjective  Patient ID: Rodney Crane, male    DOB: 28-Aug-1950, 71 y.o.   MRN: [id number]   HPI Rodney Crane is a pleasant 71 year old African-American male, established with Dr. Henrene Pastor, and known from prior colonoscopies with history of adenomatous colon polyps and GERD. He is referred back today by Dr. Marton Redwood with Hemoccult positive stool, and also has iron deficiency anemia. Patient had last undergone colonoscopy in August 2020 for follow-up of adenomatous polyps.  He had 5 polyps removed all 2 to 7 mm in size and noted internal hemorrhoids.  Path on the polyps-all were consistent with tubular adenomas and indicated for 5-year interval follow-up.  He has not had prior EGD.   Patient also had work-up for elevated alkaline phosphatase was determined to be of hepatic etiology.  He underwent MRI/MRCP in August 2021 with finding of multiple small gallstones, no ductal dilation noted, does have pancreas divisum and had a normal-appearing liver.   We were able to obtain copies of some recent labs from [facility].  Patient relates that he was initially diagnosed with iron deficiency anemia last fall and had undergone 2 iron infusions. He and his wife state that he had a Hemoccult in March 2023 which was positive and then had a recent Hemoccult on 11/23/2021 positive x3. He says his iron deficiency has also worsened again recently and he had had a drop in hemoglobin. He is not noticed any hematochezia, has noticed some dark stools at times but no overt melena.  He feels as if his stool was darker after the iron infusions.  He says he was unable to tolerate oral iron due to severe constipation, does take MiraLAX daily.  Has just recently completed 2 more iron infusions. He has no current complaints of abdominal pain, no recent changes in bowel habits, no complaints of heartburn or indigestion, no dysphagia.  He is maintained on Protonix 40 mg p.o. daily.    He also had required an I&D of a perianal abscess in the fall 2022. He had been started on Farxiga at some point after that interval and then developed another perianal abscess which was drained in the office per Dr. Georgette Dover on 12/02/2021.  He has been off [place].   He is maintained on chronic Plavix with prior history of CVA   Review of labs show hemoglobin from September 2022 of 8.6/hematocrit 29.0/MCV 79.2, at that time LFTs were normal/alk phos 126 , Ferritin 8.4/iron 20/iron saturation 4/TIBC 481. Labs November 2022 hemoglobin 8.1/hematocrit 25.7/MCV 77 January 2023 hemoglobin 11.8/hematocrit 35.6/MCV 84   July 2023 alk phos 205 other LFTs within normal limits WBC 6.26/hemoglobin 9.7/hematocrit 29.3/MCV of 90 Iron 26/TIBC 413/ferritin 16   Labs 12/01/2021 hemoglobin 8.5/hematocrit 26.1/MCV 87/platelets 322   Other medical issues include the prior CVA with residual right-sided weakness, prior history of DVT, sleep apnea with CPAP use no oxygen, adult onset diabetes mellitus, chronic kidney disease, polyneuropathy, obesity.   Review of Systems. Pertinent positive and negative review of systems were noted in the above HPI section.  All other review of systems was otherwise negative.        Outpatient Encounter Medications as of 12/22/2021  Medication Sig   ACCU-CHEK GUIDE test strip USE TO TEST BLOOD SUGAR DAILY   amLODipine (NORVASC) 5 MG tablet 1 tablet Orally Once a day for 30 days   atorvastatin (LIPITOR) 40 MG tablet Take 0.5 tablets (20 mg total) by mouth at  bedtime. (Patient taking differently: Take 40 mg by mouth at bedtime.)   blood glucose meter kit and supplies KIT Dispense based on patient and insurance preference. Use up to four times daily as directed. (FOR ICD-9 250.00, 250.01).   clopidogrel (PLAVIX) 75 MG tablet Take 75 mg by mouth daily.    cyclobenzaprine (FLEXERIL) 10 MG tablet Take 1 tablet (10 mg total) by mouth every 8 (eight) hours as needed for muscle spasms.    metFORMIN (GLUCOPHAGE) 1000 MG tablet Take 1,000 mg by mouth 2 (two) times daily with a meal.   Na Sulfate-K Sulfate-Mg Sulf 17.5-3.13-1.6 GM/177ML SOLN Take 1 kit by mouth once for 1 dose.   oxyCODONE-acetaminophen (PERCOCET/ROXICET) 5-325 MG tablet Take 1 tablet by mouth 3 (three) times daily as needed.   pantoprazole (PROTONIX) 40 MG tablet Take 1 tablet (40 mg total) by mouth daily.   polyethylene glycol (MIRALAX / GLYCOLAX) packet Take 17 g by mouth 2 (two) times daily. (Patient taking differently: Take 17 g by mouth daily.)   Probiotic Product ([place]) Take by mouth.   sildenafil (VIAGRA) 100 MG tablet sildenafil 100 mg tablet  TAKE 1 TABLET BY MOUTH ONCE DAILY AS NEEDED   valsartan-hydrochlorothiazide (DIOVAN-HCT) 320-25 MG tablet Take 1 tablet by mouth daily.   [DISCONTINUED] sildenafil (REVATIO) 20 MG tablet Take 20-100 mg by mouth as needed (for erectile dysfunction).     No facility-administered encounter medications on file as of 12/22/2021.         Allergies  Allergen Reactions   Farxiga [Dapagliflozin] Other (See Comments)      Infection and kidney involvement   Losartan Potassium        Other reaction(s): sexual dysfunction/ED        Patient Active Problem List    Diagnosis Date Noted   Alkaline phosphatase raised 05/13/2021   Anemia 05/13/2021   Diabetic peripheral neuropathy associated with type 2 diabetes mellitus ([place]) 05/13/2021   Increased band cell count 05/13/2021   Morbid obesity ([place]) 05/13/2021   Right hemiparesis ([place]) 05/13/2021   Carpal tunnel syndrome of left wrist 10/23/2019   Acquired trigger finger of right index finger 08/27/2019   Gastro-esophageal reflux disease without esophagitis 06/13/2019   CVA (cerebral vascular accident) ([place]) 06/05/2019   Pain due to onychomycosis of toenails of both feet 10/25/2018   Raynaud's disease 06/27/2018   Cervical post-laminectomy syndrome 06/11/2018   Chronic pain syndrome 06/11/2018    Degeneration of lumbar intervertebral disc 06/11/2018   Abdominal aortic ectasia ([place]) 02/21/2017   History of arthrodesis 08/02/2016   History of thromboembolism of vein 08/02/2016   Acute deep vein thrombosis (DVT) of distal vein of both lower extremities ([place]) 07/07/2016   Abdominal distention     Abdominal pain     S/P cervical spinal fusion     Type 2 diabetes mellitus with peripheral neuropathy (HCC)     Hyperlipidemia     Neurogenic bowel     Myelopathy (HCC)     OSA on CPAP     Surgery, elective     History of CVA (cerebrovascular accident)     Benign essential HTN     History of cervical spinal surgery     History of lumbar surgery     Tobacco abuse     Diastolic dysfunction     Acute blood loss anemia     Post-operative pain     Tachycardia     Leukocytosis     Spinal stenosis of lumbar  region     Central cord syndrome ([place])     Acute hypoxemic respiratory failure ([place])     Spinal stenosis in cervical region 06/27/2016   Cervical stenosis of spinal canal 06/25/2016   Weakness of extremity 06/25/2016   OSA (obstructive sleep apnea)     Hypertension     Diabetes ([place])     Benign prostatic hyperplasia without lower urinary tract symptoms 01/21/2016   Diabetic renal disease ([place]) 01/21/2016   Abnormal gait 12/16/2014   Foot-drop 12/16/2014   Low back pain 01/17/2011   DYSPNEA ON EXERTION 10/06/2009   Nonspecific (abnormal) findings on radiological and other examination of body structure 10/06/2009   CT, CHEST, ABNORMAL 10/06/2009   COLONIC POLYPS 10/05/2009   DM (diabetes mellitus), type 2 with renal complications ([place]) 28/76/8115   Type 2 diabetes mellitus with hyperlipidemia ([place]) 10/05/2009   BELL'S PALSY 10/05/2009   Essential hypertension 10/05/2009   CVA (cerebrovascular accident) ([place]) 10/05/2009   ERECTILE DYSFUNCTION, 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/2009   Polyneuropathy 07/06/2009    Social History         Socioeconomic History   Marital status: Married       Spouse name: Sales executive   Number of children: 2   Years of education: 14   Highest education level: Not on file  Occupational History   Occupation: RETIRED      Comment: retired  Tobacco Use   Smoking status: Former      Types: Cigarettes      Quit date: 01/21/2011      Years since quitting: 10.9   Smokeless tobacco: Never   Tobacco comments:      still smokes occasional cigars  Vaping Use   Vaping Use: Never used  Substance and Sexual Activity   Alcohol use: No   Drug use: No   Sexual activity: Not on file  Other Topics Concern   Not on file  Social History Narrative    Patient is right handed and resides with wife    Social Determinants of Health    Financial Resource Strain: Not on file  Food Insecurity: Not on file  Transportation Needs: Not on file  Physical Activity: Not on file  Stress: Not on file  Social Connections: Not on file  Intimate Partner Violence: Not on file      Mr. Lacey's family history includes Dementia in his mother; Diabetes in his mother; Hypertension in his brother; Lung cancer in his father; Multiple sclerosis in his sister; Sleep apnea in his brother.         Objective:    Objective     Vitals:    12/22/21 1415  BP: 110/70  Pulse: 74      Physical Exam Well-developed well-nourished older African-American male no acute distress.  Patient ambulates with a cane.  Accompanied by his wife   height, Weight, 255 BMI 31.9   HEENT; nontraumatic normocephalic, EOMI, PE R LA, sclera anicteric. Oropharynx; not examined today Neck; supple, no JVD Cardiovascular; regular rate and rhythm with S1-S2, no murmur rub or gallop Pulmonary; Clear bilaterally Abdomen; soft, nontender, nondistended, no palpable mass or hepatosplenomegaly, bowel sounds are active Rectal; not done today Skin; benign exam, no jaundice rash or appreciable lesions Extremities; no clubbing cyanosis or edema skin warm and dry Neuro/Psych; alert and oriented x4, grossly  nonfocal mood and affect appropriate            Assessment & Plan:    #2 71 year old African-American male with persistent iron deficiency  anemia and Hemoccult positive stool in the setting of chronic Plavix Initial diagnosis of iron deficiency anemia fall 2022, underwent iron infusions with improvement in parameters, then labs July 2023 revealed recurrence of the iron deficiency and anemia with drift in hemoglobin and Hemoccults were positive x3. Patient has not had any overt bleeding, has noticed some darker stools on occasion, no other current GI symptoms.   Etiology of the iron deficiency anemia and Hemoccult positive stool is not clear, suspect chronic occult GI blood loss, especially in setting of antiplatelet therapy. Rule out upper versus lower GI sources, chronic gastropathy, AVMs, Cameron erosions, occult neoplasm,   #2 history of adenomatous colon polyps-last colonoscopy August 2020 with removal of 5 tubular adenomatous polyps and plan was for 5-year interval follow-up   #3 history of CVA with residual right-sided weakness, on chronic Plavix #4 antiplatelet therapy chronic on Plavix\ #5.  GERD stable on Protonix #6 sleep apnea with CPAP use no oxygen #7.  Adult onset diabetes mellitus #8.  History of polyneuropathy #9.  Hypertension #10 history of elevated alkaline phosphatase, of hepatic etiology, prior work-up has been negative, patient did have mildly elevated ANA and ASMA 2021. MRI/MRCP 11/2019 showed a normal-appearing liver, no ductal dilation, and multiple very small gallstones. Subsequent alk phos had normalized but most recent again mildly elevated at 205   Plan; hepatic panel today, repeat CBC today Patient will be scheduled for colonoscopy and upper endoscopy with Dr. Henrene Pastor.  Both procedures were discussed in detail with the patient including indications risk and benefits and he is agreeable to proceed. He will need to hold Plavix for 5 days prior to procedure, we  will communicate with his PCP Dr. Marton Redwood to assure that holding Plavix is reasonable for this patient.   We briefly discussed capsule endoscopy should EGD and colonoscopy be unrevealing as to source of the iron deficiency anemia.   Further recommendations pending results of above.

## 2022-01-20 NOTE — Progress Notes (Signed)
VS by [place]  Pt's states no medical or surgical changes since previsit or office visit.  

## 2022-01-20 NOTE — Op Note (Signed)
[facility] Patient Name: Rodney Crane Procedure Date: 01/20/2022 3:08 PM MRN: [id number] Endoscopist: Docia Chuck. Henrene Pastor , MD Age: 71 Referring MD:  Date of Birth: 1950/08/03 Gender: Male Account #: [id number] Procedure:                Colonoscopy with cold snare polypectomy x 3 Indications:              Iron deficiency anemia. Patient has a history of                            multiple adenomatous colon polyps. Previous                            examinations 2006 ([place]), 2013, 2020 Medicines:                Monitored Anesthesia Care Procedure:                Pre-Anesthesia Assessment:                           - Prior to the procedure, a History and Physical                            was performed, and patient medications and                            allergies were reviewed. The patient's tolerance of                            previous anesthesia was also reviewed. The risks                            and benefits of the procedure and the sedation                            options and risks were discussed with the patient.                            All questions were answered, and informed consent                            was obtained. Prior Anticoagulants: The patient has                            taken Plavix (clopidogrel), last dose was 7 days                            prior to procedure. ASA Grade Assessment: III - A                            patient with severe systemic disease. After                            reviewing the risks and benefits, the patient was  deemed in satisfactory condition to undergo the                            procedure.                           After obtaining informed consent, the colonoscope                            was passed under direct vision. Throughout the                            procedure, the patient's blood pressure, pulse, and                            oxygen saturations were monitored  continuously. The                            Colonoscope was introduced through the anus and                            advanced to the the cecum, identified by                            appendiceal orifice and ileocecal valve. The                            terminal ileum, ileocecal valve, appendiceal                            orifice, and rectum were photographed. The quality                            of the bowel preparation was excellent. The                            colonoscopy was performed without difficulty. The                            patient tolerated the procedure well. The bowel                            preparation used was SUPREP via split dose                            instruction. Scope In: 3:20:48 PM Scope Out: 3:45:29 PM Scope Withdrawal Time: 0 hours 18 minutes 42 seconds  Total Procedure Duration: 0 hours 24 minutes 41 seconds  Findings:                 The terminal ileum appeared normal.                           Three polyps were found in the descending colon,  transverse colon and ascending colon. The polyps                            were 2 to 4 mm in size. These polyps were removed                            with a cold snare. Resection and retrieval were                            complete.                           Internal hemorrhoids were found during retroflexion.                           The exam was otherwise without abnormality on                            direct and retroflexion views. Complications:            No immediate complications. Estimated blood loss:                            None. Estimated Blood Loss:     Estimated blood loss: none. Impression:               - The examined portion of the ileum was normal.                           - Three 2 to 4 mm polyps in the descending colon,                            in the transverse colon and in the ascending colon,                            removed with a cold  snare. Resected and retrieved.                           - Internal hemorrhoids.                           - The examination was otherwise normal on direct                            and retroflexion views. Recommendation:           - Repeat colonoscopy in 5 years for surveillance.                           - Resume Plavix (clopidogrel) today at prior dose.                           - Patient has a contact number available for  emergencies. The signs and symptoms of potential                            delayed complications were discussed with the                            patient. Return to normal activities tomorrow.                            Written discharge instructions were provided to the                            patient.                           - Resume previous diet.                           - Continue present medications.                           - Await pathology results.                           - EGD today. Please see report regarding findings                            and final recommendations Nicolaos Mitrano N. Henrene Pastor, MD 01/20/2022 3:52:32 PM This report has been signed electronically.

## 2022-01-20 NOTE — Progress Notes (Signed)
Called to room to assist during endoscopic procedure.  Patient ID and intended procedure confirmed with present staff. Received instructions for my participation in the procedure from the performing physician.  

## 2022-01-21 ENCOUNTER — Telehealth: Payer: Self-pay

## 2022-01-21 NOTE — Telephone Encounter (Signed)
  Follow up Call-     01/20/2022    2:23 PM  Call back number  Post procedure Call Back phone  # [phone]  Permission to leave phone message Yes     Patient questions:  Do you have a fever, pain , or abdominal swelling? No. Pain Score  0 *  Have you tolerated food without any problems? Yes.    Have you been able to return to your normal activities? Yes.    Do you have any questions about your discharge instructions: Diet   No. Medications  No. Follow up visit  No.  Do you have questions or concerns about your Care? No.  Actions: * If pain score is 4 or above: No action needed, pain <4.

## 2022-01-25 ENCOUNTER — Encounter: Payer: Self-pay | Admitting: Internal Medicine

## 2022-01-27 DIAGNOSIS — Z23 Encounter for immunization: Secondary | ICD-10-CM | POA: Diagnosis not present

## 2022-02-02 NOTE — Progress Notes (Signed)
[facility] Summary Report / Loop Recorder 

## 2022-02-04 DIAGNOSIS — D649 Anemia, unspecified: Secondary | ICD-10-CM | POA: Diagnosis not present

## 2022-02-04 DIAGNOSIS — E785 Hyperlipidemia, unspecified: Secondary | ICD-10-CM | POA: Diagnosis not present

## 2022-02-04 DIAGNOSIS — D509 Iron deficiency anemia, unspecified: Secondary | ICD-10-CM | POA: Diagnosis not present

## 2022-02-04 DIAGNOSIS — R7989 Other specified abnormal findings of blood chemistry: Secondary | ICD-10-CM | POA: Diagnosis not present

## 2022-02-04 DIAGNOSIS — I10 Essential (primary) hypertension: Secondary | ICD-10-CM | POA: Diagnosis not present

## 2022-02-17 ENCOUNTER — Encounter: Payer: Self-pay | Admitting: Podiatry

## 2022-02-17 ENCOUNTER — Ambulatory Visit (INDEPENDENT_AMBULATORY_CARE_PROVIDER_SITE_OTHER): Payer: Medicare Other | Admitting: Podiatry

## 2022-02-17 DIAGNOSIS — E1142 Type 2 diabetes mellitus with diabetic polyneuropathy: Secondary | ICD-10-CM | POA: Diagnosis not present

## 2022-02-17 DIAGNOSIS — B351 Tinea unguium: Secondary | ICD-10-CM

## 2022-02-17 DIAGNOSIS — M79675 Pain in left toe(s): Secondary | ICD-10-CM | POA: Diagnosis not present

## 2022-02-17 DIAGNOSIS — M79674 Pain in right toe(s): Secondary | ICD-10-CM

## 2022-02-17 NOTE — Progress Notes (Signed)
This patient returns to my office for at risk foot care.  This patient requires this care by a professional since this patient will be at risk due to having type 2 diabetes and CVA.  This patient is unable to cut nails himself since the patient cannot reach his nails.These nails are painful walking and wearing shoes.  This patient presents for at risk foot care today.  General Appearance  Alert, conversant and in no acute stress.  Vascular  Dorsalis pedis and posterior tibial  pulses are palpable  bilaterally.  Capillary return is within normal limits  bilaterally. Temperature is within normal limits  bilaterally.  Neurologic  Senn-Weinstein monofilament wire test diminished   bilaterally. Muscle power within normal limits bilaterally.  Nails Thick disfigured discolored nails with subungual debris  from hallux to fifth toes bilaterally. No evidence of bacterial infection or drainage bilaterally.  Orthopedic  No limitations of motion  feet .  No crepitus or effusions noted.  No bony pathology or digital deformities noted.  Skin  normotropic skin with no porokeratosis noted bilaterally.  No signs of infections or ulcers noted.   Asymptomatic heel callus  Onychomycosis  Pain in right toes  Pain in left toes  Consent was obtained for treatment procedures.   Mechanical debridement of nails 1-5  bilaterally performed with a nail nipper.  Filed with dremel without incident.   Filed heel callus with dremel.   Return office visit    3 months                 Told patient to return for periodic foot care and evaluation due to potential at risk complications.   Gardiner Barefoot DPM

## 2022-02-21 ENCOUNTER — Ambulatory Visit: Payer: Medicare Other | Attending: Cardiovascular Disease

## 2022-02-21 DIAGNOSIS — I639 Cerebral infarction, unspecified: Secondary | ICD-10-CM

## 2022-02-22 LAB — CUP PACEART REMOTE DEVICE CHECK
Date Time Interrogation Session: 20231027230543
Implantable Pulse Generator Implant Date: 20210211

## 2022-03-26 NOTE — Progress Notes (Signed)
[facility] Summary Report / Loop Recorder 

## 2022-03-28 ENCOUNTER — Ambulatory Visit (INDEPENDENT_AMBULATORY_CARE_PROVIDER_SITE_OTHER): Payer: Medicare Other

## 2022-03-28 DIAGNOSIS — I639 Cerebral infarction, unspecified: Secondary | ICD-10-CM

## 2022-03-28 LAB — CUP PACEART REMOTE DEVICE CHECK
Date Time Interrogation Session: 20231203231828
Implantable Pulse Generator Implant Date: 20210211

## 2022-04-13 DIAGNOSIS — Z79891 Long term (current) use of opiate analgesic: Secondary | ICD-10-CM | POA: Diagnosis not present

## 2022-04-13 DIAGNOSIS — M5412 Radiculopathy, cervical region: Secondary | ICD-10-CM | POA: Diagnosis not present

## 2022-04-13 DIAGNOSIS — M5136 Other intervertebral disc degeneration, lumbar region: Secondary | ICD-10-CM | POA: Diagnosis not present

## 2022-04-13 DIAGNOSIS — K5900 Constipation, unspecified: Secondary | ICD-10-CM | POA: Diagnosis not present

## 2022-04-13 DIAGNOSIS — M961 Postlaminectomy syndrome, not elsewhere classified: Secondary | ICD-10-CM | POA: Diagnosis not present

## 2022-04-13 DIAGNOSIS — G894 Chronic pain syndrome: Secondary | ICD-10-CM | POA: Diagnosis not present

## 2022-04-13 DIAGNOSIS — Z5181 Encounter for therapeutic drug level monitoring: Secondary | ICD-10-CM | POA: Diagnosis not present

## 2022-04-13 DIAGNOSIS — T402X5A Adverse effect of other opioids, initial encounter: Secondary | ICD-10-CM | POA: Insufficient documentation

## 2022-05-02 ENCOUNTER — Ambulatory Visit (INDEPENDENT_AMBULATORY_CARE_PROVIDER_SITE_OTHER): Payer: No Typology Code available for payment source

## 2022-05-02 DIAGNOSIS — I639 Cerebral infarction, unspecified: Secondary | ICD-10-CM | POA: Diagnosis not present

## 2022-05-03 LAB — CUP PACEART REMOTE DEVICE CHECK
Date Time Interrogation Session: 20240107232117
Implantable Pulse Generator Implant Date: 20210211

## 2022-05-05 NOTE — Progress Notes (Signed)
[facility] Summary Report / Loop Recorder 

## 2022-05-26 ENCOUNTER — Ambulatory Visit: Payer: Medicare Other | Admitting: Podiatry

## 2022-06-06 ENCOUNTER — Ambulatory Visit: Payer: Medicare Other

## 2022-06-06 DIAGNOSIS — I639 Cerebral infarction, unspecified: Secondary | ICD-10-CM | POA: Diagnosis not present

## 2022-06-07 LAB — CUP PACEART REMOTE DEVICE CHECK
Date Time Interrogation Session: 20240209231229
Implantable Pulse Generator Implant Date: 20210211

## 2022-06-09 NOTE — Progress Notes (Signed)
[facility] Summary Report / Loop Recorder 

## 2022-07-11 ENCOUNTER — Ambulatory Visit (INDEPENDENT_AMBULATORY_CARE_PROVIDER_SITE_OTHER): Payer: Medicare Other

## 2022-07-11 DIAGNOSIS — I639 Cerebral infarction, unspecified: Secondary | ICD-10-CM

## 2022-07-12 LAB — CUP PACEART REMOTE DEVICE CHECK
Date Time Interrogation Session: 20240317231944
Implantable Pulse Generator Implant Date: 20210211

## 2022-07-21 NOTE — Progress Notes (Signed)
[facility] Summary Report / Loop Recorder 

## 2022-08-15 ENCOUNTER — Ambulatory Visit (INDEPENDENT_AMBULATORY_CARE_PROVIDER_SITE_OTHER): Payer: Medicare Other

## 2022-08-15 DIAGNOSIS — I639 Cerebral infarction, unspecified: Secondary | ICD-10-CM

## 2022-08-16 LAB — CUP PACEART REMOTE DEVICE CHECK
Date Time Interrogation Session: 20240421231602
Implantable Pulse Generator Implant Date: 20210211

## 2022-08-18 ENCOUNTER — Encounter: Payer: Self-pay | Admitting: Podiatry

## 2022-08-18 ENCOUNTER — Ambulatory Visit: Payer: Medicare Other | Admitting: Podiatry

## 2022-08-18 DIAGNOSIS — E1142 Type 2 diabetes mellitus with diabetic polyneuropathy: Secondary | ICD-10-CM

## 2022-08-18 DIAGNOSIS — M79674 Pain in right toe(s): Secondary | ICD-10-CM | POA: Diagnosis not present

## 2022-08-18 DIAGNOSIS — B351 Tinea unguium: Secondary | ICD-10-CM

## 2022-08-18 DIAGNOSIS — M79675 Pain in left toe(s): Secondary | ICD-10-CM | POA: Diagnosis not present

## 2022-08-18 NOTE — Progress Notes (Signed)
This patient returns to my office for at risk foot care.  This patient requires this care by a professional since this patient will be at risk due to having type 2 diabetes and CVA.  This patient is unable to cut nails himself since the patient cannot reach his nails.These nails are painful walking and wearing shoes.  This patient presents for at risk foot care today.  General Appearance  Alert, conversant and in no acute stress.  Vascular  Dorsalis pedis and posterior tibial  pulses are palpable  bilaterally.  Capillary return is within normal limits  bilaterally. Temperature is within normal limits  bilaterally.  Neurologic  Senn-Weinstein monofilament wire test diminished   bilaterally. Muscle power within normal limits bilaterally.  Nails Thick disfigured discolored nails with subungual debris  from hallux to fifth toes bilaterally. No evidence of bacterial infection or drainage bilaterally.  Orthopedic  No limitations of motion  feet .  No crepitus or effusions noted.  No bony pathology or digital deformities noted.  Skin  normotropic skin with no porokeratosis noted bilaterally.  No signs of infections or ulcers noted.   Asymptomatic heel callus  Onychomycosis  Pain in right toes  Pain in left toes  Consent was obtained for treatment procedures.   Mechanical debridement of nails 1-5  bilaterally performed with a nail nipper.  Filed with dremel without incident.   Filed heel callus with dremel.   Return office visit    3 months                 Told patient to return for periodic foot care and evaluation due to potential at risk complications.   March Steyer DPM  

## 2022-08-23 NOTE — Progress Notes (Signed)
[facility] Summary Report / Loop Recorder 

## 2022-09-20 ENCOUNTER — Ambulatory Visit (INDEPENDENT_AMBULATORY_CARE_PROVIDER_SITE_OTHER): Payer: Medicare Other

## 2022-09-20 DIAGNOSIS — I639 Cerebral infarction, unspecified: Secondary | ICD-10-CM | POA: Diagnosis not present

## 2022-09-20 LAB — CUP PACEART REMOTE DEVICE CHECK
Date Time Interrogation Session: 20240524230716
Implantable Pulse Generator Implant Date: 20210211

## 2022-09-21 NOTE — Progress Notes (Signed)
[facility] Summary Report / Loop Recorder 

## 2022-10-13 ENCOUNTER — Encounter: Payer: Self-pay | Admitting: Neurology

## 2022-10-13 ENCOUNTER — Ambulatory Visit: Payer: Medicare Other | Admitting: Neurology

## 2022-10-13 VITALS — BP 146/78 | HR 97 | Ht 75.0 in | Wt 273.2 lb

## 2022-10-13 DIAGNOSIS — G4733 Obstructive sleep apnea (adult) (pediatric): Secondary | ICD-10-CM

## 2022-10-13 NOTE — Patient Instructions (Signed)
Continue superb CPAP compliance with nightly use for minimum of 4 hours.  Will continue current settings.  I will see him in 1 year for virtual visit.  Let me know if you need anything sooner.  Take care!

## 2022-10-13 NOTE — Progress Notes (Signed)
Patient: Rodney Crane Date of Birth: [birth year]  Reason for Visit: Follow up History from: Patient Primary Neurologist: [name]    ASSESSMENT AND PLAN 72 y.o. year old male   1.  OSA on CPAP 2.  History of stroke 2021  -Doing excellent on CPAP, has superb compliance -Continue current settings, send supplies as needed -He should be eligible for a new CPAP machine in July 2025 -Send an order to DME to continue current settings and send supplies as needed -We will plan to follow-up virtually in 1 year or sooner if needed  HISTORY OF PRESENT ILLNESS: Today 10/13/22 Here today for CPAP follow-up.  Continues to have superb compliance at 100%.  Set pressure 7 cm water.  EPR level 2.  AHI 0.9, leak 20.3. using nasal pillow mask. He likes this. The mask may reposition during his sleep, his wife wakes him up. Machine is doing well. Looks like his machine was setup July 2020. No major health issues. Using cane. Needs more exercise. Has right AFO. He doesn't drive due to foot drop.  ESS 3.  HISTORY  10/07/21 Dr. [name]: Mr. Rodney Crane is a 72 year old right-handed gentleman with an underlying medical history of hypertension, smoking, hyperlipidemia, type 2 diabetes, degenerative back disease with s/p multiple back surgeries, L thalamic stroke and mild obesity, who presents for follow-up consultation of his stroke and sleep apnea, on CPAP therapy. The patient is unaccompanied today. He had to miss his appointment on 07/21/21 due to his wife's surgery. I last saw him on 07/20/2020, at which time he was fully compliant with his CPAP machine.  He was doing overall quite well.  He had no recent strokelike symptoms.  He did have a couple of falls with minor skin injuries and minor bruising thankfully only.  He had recent cataract surgery, he was diagnosed with carpal tunnel syndrome by orthopedics.  He was using an AFO for his right leg.  He was advised to follow-up routinely in 1 year.   Today, 10/07/2021: I  reviewed his CPAP compliance data from 09/05/2021 through 10/04/2021, which is a total of 30 days, during which time he used his machine every night with percent use days greater than 4 hours at 100%, indicating superb compliance with an average usage of 7 hours and 59 minutes, residual AHI at goal at 0.6/h, leak on the high side with slight fluctuation with the 95th percentile at 23.6 L/min on a pressure of 7 cm with EPR of 2.   I reviewed his CPAP compliance data from 06/20/2021 through 07/19/2021, which is a total of 30 days, during which time he used his machine every night with percent used days greater than 4 hours at 100%, indicating superb compliance with an average usage of 8 hours and 17 minutes, residual AHI at goal at 0.7/h, leak on the higher side with the 95th percentile at 21.7 L/min on a pressure of 7 cm with EPR of 2.  He reports doing well from the sleep apnea standpoint.  Unfortunately, he had a series of complications late last year when he was on Farxiga.  He had developed significant side effects including frequent urination, dehydration, hematuria, and eventually developed a anal/rectal abscess with evidence of sepsis.  He was treated as an outpatient.  He had lost weight in the process and also lost blood.  He needed iron infusions.  He is slowly recuperated and feels much better now.  His wife had hip replacement surgery and is recuperating.  He is  supposed to schedule his yearly physical.  He saw [facility] for his perianal abscess.  REVIEW OF SYSTEMS: Out of a complete 14 system review of symptoms, the patient complains only of the following symptoms, and all other reviewed systems are negative.  See HPI  ALLERGIES: Allergies  Allergen Reactions   Farxiga [Dapagliflozin] Other (See Comments)    Infection and kidney involvement   Losartan Potassium     Other reaction(s): sexual dysfunction/ED    HOME MEDICATIONS: Outpatient Medications Prior to Visit  Medication  Sig Dispense Refill   Accu-Chek FastClix Lancets MISC Check sugar daily     ACCU-CHEK GUIDE test strip USE TO TEST BLOOD SUGAR DAILY     amLODipine (NORVASC) 5 MG tablet 1 tablet Orally Once a day for 30 days     atorvastatin (LIPITOR) 40 MG tablet Take 0.5 tablets (20 mg total) by mouth at bedtime. (Patient taking differently: Take 40 mg by mouth at bedtime.) 30 tablet 7   blood glucose meter kit and supplies KIT Dispense based on patient and insurance preference. Use up to four times daily as directed. (FOR ICD-9 250.00, 250.01). 1 each 0   clopidogrel (PLAVIX) 75 MG tablet Take 75 mg by mouth daily.      cyclobenzaprine (FLEXERIL) 10 MG tablet Take 1 tablet (10 mg total) by mouth every 8 (eight) hours as needed for muscle spasms. 30 tablet 0   metFORMIN (GLUCOPHAGE) 1000 MG tablet Take 1,000 mg by mouth 2 (two) times daily with a meal.     oxyCODONE-acetaminophen (PERCOCET/ROXICET) 5-325 MG tablet Take 1 tablet by mouth 3 (three) times daily as needed.     pantoprazole (PROTONIX) 40 MG tablet Take 1 tablet (40 mg total) by mouth daily. 30 tablet 1   polyethylene glycol (MIRALAX / GLYCOLAX) packet Take 17 g by mouth 2 (two) times daily. (Patient taking differently: Take 17 g by mouth daily.)     Probiotic Product (PHILLIPS COLON HEALTH PO) Take by mouth.     sildenafil (VIAGRA) 100 MG tablet sildenafil 100 mg tablet  TAKE 1 TABLET BY MOUTH ONCE DAILY AS NEEDED     valsartan-hydrochlorothiazide (DIOVAN-HCT) 320-25 MG tablet Take 1 tablet by mouth daily.     No facility-administered medications prior to visit.    PAST MEDICAL HISTORY: Past Medical History:  Diagnosis Date   Abdominal aortic ectasia (HCC)    Arthritis    Cervical spondylosis    Colon polyps    CVA (cerebral infarction) 2010   Diabetes mellitus    Dizziness    DVT (deep venous thrombosis) (HCC)    bilateral   ED (erectile dysfunction)    Foot drop    H/O Bell's palsy    Hyperlipidemia    Hypertension    Obesity     OSA on CPAP    Raynauds syndrome    Restrictive lung disease    Spinal stenosis    Stroke (HCC)    Unsteady gait     PAST SURGICAL HISTORY: Past Surgical History:  Procedure Laterality Date   CARPAL TUNNEL RELEASE Bilateral    CERVICAL DISCECTOMY  2004   LOOP RECORDER INSERTION N/A 06/06/2019   Procedure: LOOP RECORDER INSERTION;  Surgeon: Hillis Range, MD;  Location: [facility];  Service: Cardiovascular;  Laterality: N/A;   LUMBAR DISC SURGERY     x6   POSTERIOR CERVICAL FUSION/FORAMINOTOMY N/A 06/27/2016   Procedure: POSTERIOR CERVICAL FUSION C2-C5; C2-C4 Decompression;  Surgeon: Venita Lick, MD;  Location: [facility]  OR;  Service: Orthopedics;  Laterality: N/A;    FAMILY HISTORY: Family History  Problem Relation Age of Onset   Dementia Mother    Diabetes Mother    Lung cancer Father    Multiple sclerosis Sister    Sleep apnea Brother    Hypertension Brother    Colon cancer Neg Hx    Stomach cancer Neg Hx    Esophageal cancer Neg Hx    Rectal cancer Neg Hx     SOCIAL HISTORY: Social History   Socioeconomic History   Marital status: Married    Spouse name: Proofreader   Number of children: 2   Years of education: 14   Highest education level: Not on file  Occupational History   Occupation: RETIRED    Comment: retired  Tobacco Use   Smoking status: Former    Types: Cigarettes    Quit date: 01/21/2011    Years since quitting: 11.7   Smokeless tobacco: Never   Tobacco comments:    still smokes occasional cigars  Vaping Use   Vaping Use: Never used  Substance and Sexual Activity   Alcohol use: No   Drug use: No   Sexual activity: Yes    Birth control/protection: None  Other Topics Concern   Not on file  Social History Narrative   Patient is right handed and resides with wife   Social Determinants of Corporate investment banker Strain: Not on file  Food Insecurity: Not on file  Transportation Needs: Not on file  Physical Activity: Not on file   Stress: Not on file  Social Connections: Not on file  Intimate Partner Violence: Not on file   PHYSICAL EXAM  Vitals:   10/13/22 1318  BP: (!) 146/78  Pulse: 97  Weight: 273 lb 3.2 oz (123.9 kg)  Height: 6\' 3"  (1.905 m)   Body mass index is 34.15 kg/m.  Generalized: Well developed, in no acute distress  Neurological examination  Mentation: Alert oriented to time, place, history taking. Follows all commands speech and language fluent Cranial nerve II-XII: Pupils were equal round reactive to light. Extraocular movements were full, visual field were full on confrontational test. Facial sensation and strength were normal. Head turning and shoulder shrug  were normal and symmetric. Motor: Good strength overall, exception right foot drop Sensory: Sensory testing is intact to soft touch on all 4 extremities. No evidence of extinction is noted.  Coordination: Cerebellar testing reveals good finger-nose-finger and heel-to-shin bilaterally.  Gait and station: Gait is cautious, right foot drop, has a cane   DIAGNOSTIC DATA (LABS, IMAGING, TESTING) - I reviewed patient records, labs, notes, testing and imaging myself where available.  Lab Results  Component Value Date   WBC 6.9 12/22/2021   HGB 9.3 (L) 12/22/2021   HCT 28.1 (L) 12/22/2021   MCV 87.7 12/22/2021   PLT 260.0 12/22/2021      Component Value Date/Time   NA 136 06/04/2019 1725   K 5.5 (H) 06/04/2019 1725   CL 102 06/04/2019 1725   CO2 24 06/04/2019 1704   GLUCOSE 126 (H) 06/04/2019 1725   BUN 27 (H) 06/04/2019 1725   CREATININE 1.30 (H) 06/04/2019 1725   CALCIUM 9.7 06/04/2019 1704   PROT 8.3 12/22/2021 1537   ALBUMIN 4.1 12/22/2021 1537   AST 27 12/22/2021 1537   ALT 23 12/22/2021 1537   ALKPHOS 121 (H) 12/22/2021 1537   BILITOT 0.4 12/22/2021 1537   GFRNONAA 57 (L) 06/04/2019 1704   GFRAA >60  06/04/2019 1704   Lab Results  Component Value Date   CHOL 112 06/05/2019   HDL 38 (L) 06/05/2019   LDLCALC 56  06/05/2019   TRIG 92 06/05/2019   CHOLHDL 2.9 06/05/2019   Lab Results  Component Value Date   HGBA1C 7.4 (H) 06/05/2019   No results found for: "VITAMINB12" No results found for: "TSH"  Margie Ege, AGNP-C, DNP 10/13/2022, 1:56 PM [facility] [address], [address] [place], [place] [address] [phone]

## 2022-10-14 NOTE — Progress Notes (Signed)
[facility] Summary Report / Loop Recorder 

## 2022-10-18 ENCOUNTER — Telehealth: Payer: Self-pay

## 2022-10-18 NOTE — Telephone Encounter (Signed)
Msg sent to [facility] regarding cpap order

## 2022-10-20 LAB — CUP PACEART REMOTE DEVICE CHECK
Date Time Interrogation Session: 20240626231220
Implantable Pulse Generator Implant Date: 20210211

## 2022-10-24 ENCOUNTER — Ambulatory Visit (INDEPENDENT_AMBULATORY_CARE_PROVIDER_SITE_OTHER): Payer: Medicare Other

## 2022-10-24 DIAGNOSIS — I639 Cerebral infarction, unspecified: Secondary | ICD-10-CM

## 2022-11-14 NOTE — Progress Notes (Signed)
[facility] Summary Report / Loop Recorder 

## 2022-11-17 ENCOUNTER — Ambulatory Visit: Payer: Medicare Other | Admitting: Podiatry

## 2022-11-25 ENCOUNTER — Ambulatory Visit: Payer: Medicare Other | Admitting: Podiatry

## 2022-11-25 ENCOUNTER — Encounter: Payer: Self-pay | Admitting: Podiatry

## 2022-11-25 VITALS — BP 146/74

## 2022-11-25 DIAGNOSIS — B351 Tinea unguium: Secondary | ICD-10-CM

## 2022-11-25 DIAGNOSIS — M21371 Foot drop, right foot: Secondary | ICD-10-CM | POA: Diagnosis not present

## 2022-11-25 DIAGNOSIS — E119 Type 2 diabetes mellitus without complications: Secondary | ICD-10-CM

## 2022-11-25 DIAGNOSIS — B353 Tinea pedis: Secondary | ICD-10-CM

## 2022-11-25 DIAGNOSIS — L84 Corns and callosities: Secondary | ICD-10-CM | POA: Diagnosis not present

## 2022-11-25 DIAGNOSIS — M79675 Pain in left toe(s): Secondary | ICD-10-CM

## 2022-11-25 DIAGNOSIS — E1142 Type 2 diabetes mellitus with diabetic polyneuropathy: Secondary | ICD-10-CM | POA: Diagnosis not present

## 2022-11-25 DIAGNOSIS — M79674 Pain in right toe(s): Secondary | ICD-10-CM | POA: Diagnosis not present

## 2022-11-25 MED ORDER — KETOCONAZOLE 2 % EX CREA
TOPICAL_CREAM | CUTANEOUS | 1 refills | Status: AC
Start: 2022-11-25 — End: ?

## 2022-11-25 NOTE — Patient Instructions (Signed)
 To prevent reinfection, spray shoes with lysol every evening.  Clean tub or shower with bleach based cleanser.  Athlete's Foot Athlete's foot (tinea pedis) is a fungal infection of the skin on your feet. It often occurs on the skin that is between or underneath the toes. It can also occur on the soles of your feet. The infection can spread from person to person (is contagious). It can also spread when a person's bare feet come in contact with the fungus on shower floors or on items such as shoes. What are the causes? This condition is caused by a fungus that grows in warm, moist places. You can get athlete's foot by sharing shoes, shower stalls, towels, and wet floors with someone who is infected. Not washing your feet or changing your socks often enough can also lead to athlete's foot. What increases the risk? This condition is more likely to develop in: Men. People who have a weak body defense system (immune system). People who have diabetes. People who use public showers, such as at a gym. People who wear heavy-duty shoes, such as industrial or military shoes. Seasons with warm, humid weather. What are the signs or symptoms? Symptoms of this condition include: Itchy areas between your toes or on the soles of your feet. White, flaky, or scaly areas between your toes or on the soles of your feet. Very itchy small blisters between your toes or on the soles of your feet. Small cuts in your skin. These cuts can become infected. Thick or discolored toenails. How is this diagnosed? This condition may be diagnosed with a physical exam and a review of your medical history. Your health care provider may also take a skin or toenail sample to examine under a microscope. How is this treated? This condition is treated with antifungal medicines. These may be applied as powders, ointments, or creams. In severe cases, an oral antifungal medicine may be given. Follow these instructions at  home: Medicines Apply or take over-the-counter and prescription medicines only as told by your health care provider. Apply your antifungal medicine as told by your health care provider. Do not stop using the antifungal even if your condition improves. Foot care Do not scratch your feet. Keep your feet dry: Wear cotton or wool socks. Change your socks every day or if they become wet. Wear shoes that allow air to flow, such as sandals or canvas tennis shoes. Wash and dry your feet, including the area between your toes. Also, wash and dry your feet: Every day or as told by your health care provider. After exercising. General instructions Do not let others use towels, shoes, nail clippers, or other personal items that touch your feet. Protect your feet by wearing sandals in wet areas, such as locker rooms and shared showers. Keep all follow-up visits. This is important. If you have diabetes, keep your blood sugar under control. Contact a health care provider if: You have a fever. You have swelling, soreness, warmth, or redness in your foot. Your feet are not getting better with treatment. Your symptoms get worse. You have new symptoms. You have severe pain. Summary Athlete's foot (tinea pedis) is a fungal infection of the skin on your feet. It often occurs on skin that is between or underneath the toes. This condition is caused by a fungus that grows in warm, moist places. Symptoms include white, flaky, or scaly areas between your toes or on the soles of your feet. This condition is treated with antifungal medicines.   Keep your feet clean. Always dry them thoroughly. This information is not intended to replace advice given to you by your health care provider. Make sure you discuss any questions you have with your health care provider. Document Revised: 08/02/2020 Document Reviewed: 08/02/2020 Elsevier Patient Education  2024 Elsevier Inc.  

## 2022-11-25 NOTE — Progress Notes (Signed)
    Subjective:  Patient ID: Rodney Crane, male    DOB: [birth year],  MRN: [id number]  Rodney Crane presents to clinic today for for annual diabetic foot examination  Chief Complaint  Patient presents with   Nail Problem    DFC,Referring Provider Cleatis Polka., MD,lov:07/24,A1C:7.4    Callouses    B/ L feet   New problem(s): None.   PCP is Cleatis Polka., MD.  Allergies  Allergen Reactions   Marcelline Deist [Dapagliflozin] Other (See Comments)    Infection and kidney involvement   Losartan Potassium     Other reaction(s): sexual dysfunction/ED    Review of Systems: Negative except as noted in the HPI.  Objective: No changes noted in today's physical examination. Vitals:   11/25/22 1117  BP: (!) 146/74   Rodney Crane is a pleasant 72 y.o. male morbidly obese in NAD. AAO x 3.  Vascular Examination: Capillary refill time immediate b/l. Vascular status intact b/l with palpable pedal pulses. Pedal hair absent b/l. No pain with calf compression b/l. Skin temperature gradient WNL b/l. No cyanosis or clubbing b/l. No ischemia or gangrene noted b/l.   Neurological Examination: Sensation grossly intact b/l with 10 gram monofilament. Vibratory sensation intact b/l.   Dermatological Examination: Pedal skin with normal turgor, texture and tone b/l.  No open wounds. No interdigital macerations.   Toenails 1-5 b/l thick, discolored, elongated with subungual debris and pain on dorsal palpation.   Hyperkeratotic lesion(s) right heel and submet head 5 b/l.  No erythema, no edema, no drainage, no fluctuance.  Musculoskeletal Examination: Dropfoot right lower extremity. Wearing AFO on right lower extremity. Muscle strength 5/5 to all LE muscle groups of left lower extremity.  Radiographs: None  Assessment/Plan: 1. Pain due to onychomycosis of toenails of both feet   2. Tinea pedis of both feet   3. Right foot drop   4. Callus of heel   5. Diabetic polyneuropathy associated  with type 2 diabetes mellitus (HCC)   6. Encounter for diabetic foot exam (HCC)     Meds ordered this encounter  Medications   ketoconazole (NIZORAL) 2 % cream    Sig: Apply to both feet and between toes once daily for 6 weeks.    Dispense:  60 g    Refill:  1    -Patient was evaluated and treated. All patient's and/or POA's questions/concerns answered on today's visit. -Diabetic foot examination performed today. -Continue diabetic foot care principles: inspect feet daily, monitor glucose as recommended by PCP and/or Endocrinologist, and follow prescribed diet per PCP, Endocrinologist and/or dietician. -Patient to continue soft, supportive shoe gear daily. -Toenails 1-5 b/l were debrided in length and girth with sterile nail nippers and dremel without iatrogenic bleeding.  -Callus(es) right heel and submet head 5 b/l pared utilizing sterile scalpel blade without complication or incident. Total number debrided =3. -Discussed tinea pedis infection. To prevent re-infection of tinea pedis, patient/POA/caregiver instructed to spray shoes with Lysol every evening and clean tub/shower with bleach based cleanser. -For tinea pedis, Rx sent to pharmacy for Ketoconazole Cream 2% to be applied once daily for six weeks. -Patient/POA to call should there be question/concern in the interim.   Return in about 3 months (around 02/25/2023).  Freddie Breech, DPM

## 2022-11-28 ENCOUNTER — Ambulatory Visit (INDEPENDENT_AMBULATORY_CARE_PROVIDER_SITE_OTHER): Payer: Medicare Other

## 2022-11-28 DIAGNOSIS — I639 Cerebral infarction, unspecified: Secondary | ICD-10-CM | POA: Diagnosis not present

## 2022-12-13 NOTE — Progress Notes (Signed)
[facility] Summary Report / Loop Recorder 

## 2022-12-27 ENCOUNTER — Telehealth: Payer: Self-pay

## 2022-12-27 ENCOUNTER — Other Ambulatory Visit: Payer: Self-pay

## 2022-12-27 DIAGNOSIS — R748 Abnormal levels of other serum enzymes: Secondary | ICD-10-CM

## 2022-12-27 NOTE — Telephone Encounter (Signed)
Spoke with the patient. Asked he come to the lab at his convenience for the recheck of his alkaline phosphatase.

## 2023-01-02 ENCOUNTER — Ambulatory Visit: Payer: Medicare Other

## 2023-01-02 DIAGNOSIS — I639 Cerebral infarction, unspecified: Secondary | ICD-10-CM | POA: Diagnosis not present

## 2023-01-02 LAB — CUP PACEART REMOTE DEVICE CHECK
Date Time Interrogation Session: 20240906230747
Implantable Pulse Generator Implant Date: 20210211

## 2023-01-19 NOTE — Progress Notes (Signed)
[facility] Summary Report / Loop Recorder 

## 2023-01-23 ENCOUNTER — Ambulatory Visit (INDEPENDENT_AMBULATORY_CARE_PROVIDER_SITE_OTHER): Payer: Medicare Other

## 2023-01-23 DIAGNOSIS — R748 Abnormal levels of other serum enzymes: Secondary | ICD-10-CM | POA: Diagnosis not present

## 2023-01-24 LAB — ALKALINE PHOSPHATASE: Alkaline Phosphatase: 101 U/L (ref 39–117)

## 2023-02-06 ENCOUNTER — Ambulatory Visit (INDEPENDENT_AMBULATORY_CARE_PROVIDER_SITE_OTHER): Payer: Medicare Other

## 2023-02-06 DIAGNOSIS — I639 Cerebral infarction, unspecified: Secondary | ICD-10-CM | POA: Diagnosis not present

## 2023-02-06 LAB — CUP PACEART REMOTE DEVICE CHECK
Date Time Interrogation Session: 20241013231546
Implantable Pulse Generator Implant Date: 20210211

## 2023-02-09 ENCOUNTER — Encounter: Payer: Self-pay | Admitting: Podiatry

## 2023-02-09 ENCOUNTER — Ambulatory Visit: Payer: Medicare Other | Admitting: Podiatry

## 2023-02-09 DIAGNOSIS — E1142 Type 2 diabetes mellitus with diabetic polyneuropathy: Secondary | ICD-10-CM | POA: Diagnosis not present

## 2023-02-09 DIAGNOSIS — L84 Corns and callosities: Secondary | ICD-10-CM

## 2023-02-09 DIAGNOSIS — B351 Tinea unguium: Secondary | ICD-10-CM

## 2023-02-09 DIAGNOSIS — M79675 Pain in left toe(s): Secondary | ICD-10-CM | POA: Diagnosis not present

## 2023-02-09 DIAGNOSIS — M79674 Pain in right toe(s): Secondary | ICD-10-CM

## 2023-02-18 NOTE — Progress Notes (Signed)
Subjective:  Patient ID: Rodney Crane, male    DOB: Sep 15, 1950,  MRN: [id number]  Rodney Crane presents to clinic today for: at risk foot care with history of diabetic neuropathy and callus(es) of both feet and painful thick toenails that are difficult to trim. Painful toenails interfere with ambulation. Aggravating factors include wearing enclosed shoe gear. Pain is relieved with periodic professional debridement. Painful calluses are aggravated when weightbearing with and without shoegear. Pain is relieved with periodic professional debridement.   PCP is Cleatis Polka., MD. Rodney Crane 01/10/2023.  Allergies  Allergen Reactions   Farxiga [Dapagliflozin] Other (See Comments)    Infection and kidney involvement   Losartan Potassium     Other reaction(s): sexual dysfunction/ED    Review of Systems: Negative except as noted in the HPI.  Objective: No changes noted in today's physical examination. There were no vitals filed for this visit.  Rodney Crane is a pleasant 72 y.o. male in NAD. AAO x 3.  Vascular Examination: Capillary refill time <3 seconds b/l LE. Palpable pedal pulses b/l LE. Digital hair present b/l. No pedal edema b/l. Skin temperature gradient WNL b/l. No varicosities b/l. No cyanosis or clubbing noted b/l LE.Marland Kitchen  Dermatological Examination: Pedal skin with normal turgor, texture and tone b/l. No open wounds. No interdigital macerations b/l. Toenails 1-5 b/l thickened, discolored, dystrophic with subungual debris. There is pain on palpation to dorsal aspect of nailplates. Hyperkeratotic lesion(s) submet head 5 b/l.  No erythema, no edema, no drainage, no fluctuance..  Neurological Examination: Protective sensation intact with 10 gram monofilament b/l LE. Vibratory sensation intact b/l LE. Pt has subjective symptoms of neuropathy.  Musculoskeletal Examination: Right sided hemiparesis. Dropfoot right foot. Wearing AFO on right lower extremity. Muscle strength 5/5 to all LE  muscle groups of left lower extremity.  Assessment/Plan: 1. Pain due to onychomycosis of toenails of both feet   2. Callus   3. Diabetic polyneuropathy associated with type 2 diabetes mellitus (HCC)     -Consent given for treatment as described below: -Examined patient. -Continue foot and shoe inspections daily. Monitor blood glucose per PCP/Endocrinologist's recommendations. -Mycotic toenails 1-5 bilaterally were debrided in length and girth with sterile nail nippers and dremel without incident. -Callus(es) submet head 5 b/l pared utilizing sterile scalpel blade without complication or incident. Total number debrided =2. -Patient/POA to call should there be question/concern in the interim.   Return in about 3 months (around 05/12/2023).  Rodney Crane, DPM

## 2023-02-22 NOTE — Progress Notes (Signed)
[facility] Summary Report / Loop Recorder 

## 2023-03-13 ENCOUNTER — Ambulatory Visit (INDEPENDENT_AMBULATORY_CARE_PROVIDER_SITE_OTHER): Payer: Medicare Other

## 2023-03-13 DIAGNOSIS — I639 Cerebral infarction, unspecified: Secondary | ICD-10-CM | POA: Diagnosis not present

## 2023-03-13 LAB — CUP PACEART REMOTE DEVICE CHECK
Date Time Interrogation Session: 20241117230257
Implantable Pulse Generator Implant Date: 20210211

## 2023-04-07 NOTE — Progress Notes (Signed)
[facility] Summary Report / Loop Recorder 

## 2023-04-17 ENCOUNTER — Ambulatory Visit (INDEPENDENT_AMBULATORY_CARE_PROVIDER_SITE_OTHER): Payer: Medicare Other

## 2023-04-17 DIAGNOSIS — I639 Cerebral infarction, unspecified: Secondary | ICD-10-CM | POA: Diagnosis not present

## 2023-04-17 LAB — CUP PACEART REMOTE DEVICE CHECK
Date Time Interrogation Session: 20241222230109
Implantable Pulse Generator Implant Date: 20210211

## 2023-05-22 ENCOUNTER — Ambulatory Visit: Payer: Medicare Other

## 2023-05-22 ENCOUNTER — Ambulatory Visit: Payer: Medicare Other | Admitting: Podiatry

## 2023-05-22 ENCOUNTER — Encounter: Payer: Self-pay | Admitting: Podiatry

## 2023-05-22 VITALS — Ht 75.0 in | Wt 273.2 lb

## 2023-05-22 DIAGNOSIS — B351 Tinea unguium: Secondary | ICD-10-CM

## 2023-05-22 DIAGNOSIS — E1142 Type 2 diabetes mellitus with diabetic polyneuropathy: Secondary | ICD-10-CM | POA: Diagnosis not present

## 2023-05-22 DIAGNOSIS — L84 Corns and callosities: Secondary | ICD-10-CM | POA: Diagnosis not present

## 2023-05-22 DIAGNOSIS — M79675 Pain in left toe(s): Secondary | ICD-10-CM

## 2023-05-22 DIAGNOSIS — M79674 Pain in right toe(s): Secondary | ICD-10-CM

## 2023-05-22 DIAGNOSIS — I639 Cerebral infarction, unspecified: Secondary | ICD-10-CM | POA: Diagnosis not present

## 2023-05-22 LAB — CUP PACEART REMOTE DEVICE CHECK
Date Time Interrogation Session: 20250126230237
Implantable Pulse Generator Implant Date: 20210211

## 2023-05-22 NOTE — Progress Notes (Signed)
  Subjective:  Patient ID: Rodney Crane, male    DOB: 1950-08-19,  MRN: [id number]  73 y.o. male presents to clinic with  callus(es) b/l feet and painful mycotic toenails that are difficult to trim. Painful toenails interfere with ambulation. Aggravating factors include wearing enclosed shoe gear. Pain is relieved with periodic professional debridement. Painful calluses are aggravated when weightbearing with and without shoegear. Pain is relieved with periodic professional debridement.   New problem(s): None   PCP is Cleatis Polka., MD.  Allergies  Allergen Reactions   Marcelline Deist [Dapagliflozin] Other (See Comments)    Infection and kidney involvement   Losartan Potassium     Other reaction(s): sexual dysfunction/ED   Review of Systems: Negative except as noted in the HPI.   Objective:  Rodney Crane is a pleasant 73 y.o. male morbidly obese in NAD. AAO x 3.  Vascular Examination: Vascular status intact b/l with palpable pedal pulses. CFT immediate b/l. No edema. No pain with calf compression b/l. Skin temperature gradient WNL b/l. No cyanosis or clubbing noted b/l LE.  Neurological Examination: Sensation grossly intact b/l with 10 gram monofilament. Vibratory sensation intact b/l.   Dermatological Examination: Pedal skin with normal turgor, texture and tone b/l. Toenails 1-5 b/l thick, discolored, elongated with subungual debris and pain on dorsal palpation. No hyperkeratotic lesions noted b/l. Hyperkeratotic lesion(s) right heel and 5th metatarsal head b/l lower extremities.  No erythema, no edema, no drainage, no fluctuance.  Musculoskeletal Examination: Muscle strength 5/5 to b/l LE. Dropfoot right lower extremity. Wearing AFO on right lower extremity.  Radiographs: None  Last A1c:       No data to display          Assessment:   1. Pain due to onychomycosis of toenails of both feet   2. Callus   3. Diabetic polyneuropathy associated with type 2 diabetes mellitus  (HCC)    Plan:  -Consent given for treatment as described below: -Examined patient. -Continue foot and shoe inspections daily. Monitor blood glucose per PCP/Endocrinologist's recommendations. -Patient to continue soft, supportive shoe gear daily. -Mycotic toenails 1-5 bilaterally were debrided in length and girth with sterile nail nippers and dremel without incident. -Callus(es) right heel and 5th metatarsal head b/l lower extremities pared utilizing sterile scalpel blade without complication or incident. Total number debrided =3. -Discussed tinea pedis infection. To prevent re-infection of tinea pedis, patient/POA/caregiver instructed to spray shoes with Lysol every evening and clean tub/shower with bleach based cleanser. -Patient/POA to call should there be question/concern in the interim.  Return in about 3 months (around 08/20/2023).  Rodney Crane, DPM      [place] LOCATION: [address] [place] [address]                   Office [phone]   [facility] LOCATION: [address] [place], [place] [address] Office [phone]

## 2023-05-26 ENCOUNTER — Encounter: Payer: Self-pay | Admitting: Podiatry

## 2023-05-26 ENCOUNTER — Encounter: Payer: Self-pay | Admitting: Cardiovascular Disease

## 2023-05-26 NOTE — Progress Notes (Signed)
 [facility] Summary Report / Loop Recorder

## 2023-06-26 ENCOUNTER — Ambulatory Visit: Payer: Medicare Other

## 2023-06-26 DIAGNOSIS — I639 Cerebral infarction, unspecified: Secondary | ICD-10-CM | POA: Diagnosis not present

## 2023-06-28 ENCOUNTER — Encounter: Payer: Self-pay | Admitting: Cardiovascular Disease

## 2023-06-28 LAB — CUP PACEART REMOTE DEVICE CHECK
Date Time Interrogation Session: 20250302230027
Implantable Pulse Generator Implant Date: 20210211

## 2023-06-30 NOTE — Progress Notes (Signed)
 [facility] Summary Report / Loop Recorder

## 2023-07-28 NOTE — Addendum Note (Signed)
 Addended by: Geralyn Flash D on: 07/28/2023 11:56 AM   Modules accepted: Orders

## 2023-07-28 NOTE — Progress Notes (Signed)
 [facility] Summary Report / Loop Recorder

## 2023-07-31 ENCOUNTER — Ambulatory Visit (INDEPENDENT_AMBULATORY_CARE_PROVIDER_SITE_OTHER): Payer: Medicare Other

## 2023-07-31 DIAGNOSIS — I639 Cerebral infarction, unspecified: Secondary | ICD-10-CM

## 2023-08-01 LAB — CUP PACEART REMOTE DEVICE CHECK
Date Time Interrogation Session: 20250406230248
Implantable Pulse Generator Implant Date: 20210211

## 2023-08-06 ENCOUNTER — Encounter: Payer: Self-pay | Admitting: Cardiovascular Disease

## 2023-08-24 ENCOUNTER — Encounter: Payer: Self-pay | Admitting: Podiatry

## 2023-08-24 ENCOUNTER — Ambulatory Visit: Payer: Medicare Other | Admitting: Podiatry

## 2023-08-24 DIAGNOSIS — M79675 Pain in left toe(s): Secondary | ICD-10-CM | POA: Diagnosis not present

## 2023-08-24 DIAGNOSIS — M79674 Pain in right toe(s): Secondary | ICD-10-CM | POA: Diagnosis not present

## 2023-08-24 DIAGNOSIS — L84 Corns and callosities: Secondary | ICD-10-CM | POA: Diagnosis not present

## 2023-08-24 DIAGNOSIS — B351 Tinea unguium: Secondary | ICD-10-CM | POA: Diagnosis not present

## 2023-08-24 DIAGNOSIS — E1142 Type 2 diabetes mellitus with diabetic polyneuropathy: Secondary | ICD-10-CM

## 2023-08-31 NOTE — Progress Notes (Signed)
  Subjective:  Patient ID: Rodney Crane, male    DOB: April 28, 1950,  MRN: [id number]  Rodney Crane presents to clinic today for at risk foot care with history of diabetic neuropathy and callus(es) of both feet and painful thick toenails that are difficult to trim. Painful toenails interfere with ambulation. Aggravating factors include wearing enclosed shoe gear. Pain is relieved with periodic professional debridement. Painful calluses are aggravated when weightbearing with and without shoegear. Pain is relieved with periodic professional debridement.  Chief Complaint  Patient presents with   Diabetes    "Trim my toenails."  Dr. Pecolia Bourbon - 07/10/2023; Alc - 6.1   New problem(s): None.   PCP is Jeannine Milroy., MD.  Allergies  Allergen Reactions   Elvina Hammers [Dapagliflozin] Other (See Comments)    Infection and kidney involvement   Losartan Potassium     Other reaction(s): sexual dysfunction/ED    Review of Systems: Negative except as noted in the HPI.  Objective: No changes noted in today's physical examination. There were no vitals filed for this visit. Rodney Crane is a pleasant 73 y.o. male morbidly obese in NAD. AAO x 3.  Vascular Examination: Capillary refill time immediate b/l. Vascular status intact b/l with palpable pedal pulses. Pedal hair present b/l. No pain with calf compression b/l. Skin temperature gradient WNL b/l. No cyanosis or clubbing b/l. No ischemia or gangrene noted b/l.   Neurological Examination: Sensation grossly intact b/l with 10 gram monofilament. Vibratory sensation intact b/l.   Dermatological Examination: Pedal skin with normal turgor, texture and tone b/l.  No open wounds. No interdigital macerations.   Toenails 1-5 b/l thick, discolored, elongated with subungual debris and pain on dorsal palpation.   Hyperkeratotic lesion(s) right heel.  No erythema, no edema, no drainage, no fluctuance.  Musculoskeletal Examination: Dropfoot right lower  extremity. Wearing AFO on right lower extremity. Muscle strength 5/5 to all LE muscle groups of left lower extremity.  Radiographs: None  Assessment/Plan: 1. Pain due to onychomycosis of toenails of both feet   2. Callus   3. Diabetic polyneuropathy associated with type 2 diabetes mellitus [facility])    Consent given for treatment. Patient examined.All patient's and/or POA's questions/concerns addressed on today's visit. Toenails 1-5 debrided in length and girth without incident. Callus(es) right heel pared with sharp debridement without incident. Continue foot and shoe inspections daily. Monitor blood glucose per PCP/Endocrinologist's recommendations.Continue soft, supportive shoe gear daily. Report any pedal injuries to medical professional. Call office if there are any questions/concerns. -Patient/POA to call should there be question/concern in the interim.   Return in about 3 months (around 11/24/2023).  Luella Sager, DPM      [place] LOCATION: [address], [place] [address]                   Office [phone]   [facility] LOCATION: [address] [place], [place] [address] Office [phone]

## 2023-09-04 ENCOUNTER — Ambulatory Visit (INDEPENDENT_AMBULATORY_CARE_PROVIDER_SITE_OTHER): Payer: Medicare Other

## 2023-09-04 DIAGNOSIS — I639 Cerebral infarction, unspecified: Secondary | ICD-10-CM

## 2023-09-05 LAB — CUP PACEART REMOTE DEVICE CHECK
Date Time Interrogation Session: 20250511234032
Implantable Pulse Generator Implant Date: 20210211

## 2023-09-08 ENCOUNTER — Ambulatory Visit: Payer: Self-pay | Admitting: Cardiovascular Disease

## 2023-09-19 NOTE — Progress Notes (Signed)
 [facility] Summary Report / Loop Recorder

## 2023-09-19 NOTE — Addendum Note (Signed)
 Addended by: Edra Govern D on: 09/19/2023 01:44 PM   Modules accepted: Orders

## 2023-10-05 ENCOUNTER — Ambulatory Visit

## 2023-10-05 DIAGNOSIS — I639 Cerebral infarction, unspecified: Secondary | ICD-10-CM | POA: Diagnosis not present

## 2023-10-05 LAB — CUP PACEART REMOTE DEVICE CHECK
Date Time Interrogation Session: 20250611230232
Implantable Pulse Generator Implant Date: 20210211

## 2023-10-07 ENCOUNTER — Ambulatory Visit: Payer: Self-pay | Admitting: Cardiovascular Disease

## 2023-10-11 ENCOUNTER — Telehealth: Admitting: Neurology

## 2023-10-11 DIAGNOSIS — I639 Cerebral infarction, unspecified: Secondary | ICD-10-CM | POA: Diagnosis not present

## 2023-10-11 DIAGNOSIS — G4733 Obstructive sleep apnea (adult) (pediatric): Secondary | ICD-10-CM | POA: Diagnosis not present

## 2023-10-11 NOTE — Patient Instructions (Addendum)
 I will order a home sleep study to be done without CPAP.  Once the home sleep study results I will order a new CPAP if indicated.  Once you start a new CPAP machine, you will have to be seen within 30 to 90 days of starting.

## 2023-10-11 NOTE — Progress Notes (Signed)
 Patient: Rodney Crane Date of Birth: [birth year]  Reason for Visit: Follow up History from: Patient Primary Neurologist: Omar Bibber   Virtual Visit via Video Note  I connected with Randal Bury on 10/11/23 at  3:45 PM EDT by a video enabled telemedicine application and verified that I am speaking with the correct person using two identifiers.  Location: Patient: at his home  Provider: in the office    I discussed the limitations of evaluation and management by telemedicine and the availability of in person appointments. The patient expressed understanding and agreed to proceed.  ASSESSMENT AND PLAN 73 y.o. year old male   1.  OSA on CPAP (split night sleep study in 2015 He had a total AHI of 15.2 per hour, rising to 38 per hour in REM sleep)  2.  History of stroke 2021  -Has superb CPAP compliance and benefit.  He is interested in a new CPAP machine.  His was set up in July 2020.  I will order an updated HST.  Once this returns we will order a new CPAP.  Once he starts his new CPAP machine he will need follow-up within 30 to 90 days.  HISTORY OF PRESENT ILLNESS: Today 10/11/23 10/11/23 SS: Via VV.  Here for CPAP revisit.  100% CPAP compliance.  AHI 0.7, leak 22.9.  Set pressure 7 cm water. Setup July 2020. No issues with CPAP. Still using cane with mobility issues. He has right AFO. Uses nasal pillow mask.  Would like a new CPAP.  10/13/22 SS: Here today for CPAP follow-up.  Continues to have superb compliance at 100%.  Set pressure 7 cm water.  EPR level 2.  AHI 0.9, leak 20.3. using nasal pillow mask. He likes this. The mask may reposition during his sleep, his wife wakes him up. Machine is doing well. Looks like his machine was setup July 2020. No major health issues. Using cane. Needs more exercise. Has right AFO. He doesn't drive due to foot drop.  ESS 3.  HISTORY  10/07/21 Dr. Omar Bibber: Mr. Pursifull is a 73 year old right-handed gentleman with an underlying medical history of  hypertension, smoking, hyperlipidemia, type 2 diabetes, degenerative back disease with s/p multiple back surgeries, L thalamic stroke and mild obesity, who presents for follow-up consultation of his stroke and sleep apnea, on CPAP therapy. The patient is unaccompanied today. He had to miss his appointment on 07/21/21 due to his wife's surgery. I last saw him on 07/20/2020, at which time he was fully compliant with his CPAP machine.  He was doing overall quite well.  He had no recent strokelike symptoms.  He did have a couple of falls with minor skin injuries and minor bruising thankfully only.  He had recent cataract surgery, he was diagnosed with carpal tunnel syndrome by orthopedics.  He was using an AFO for his right leg.  He was advised to follow-up routinely in 1 year.   Today, 10/07/2021: I reviewed his CPAP compliance data from 09/05/2021 through 10/04/2021, which is a total of 30 days, during which time he used his machine every night with percent use days greater than 4 hours at 100%, indicating superb compliance with an average usage of 7 hours and 59 minutes, residual AHI at goal at 0.6/h, leak on the high side with slight fluctuation with the 95th percentile at 23.6 L/min on a pressure of 7 cm with EPR of 2.   I reviewed his CPAP compliance data from 06/20/2021 through 07/19/2021, which is a total of  30 days, during which time he used his machine every night with percent used days greater than 4 hours at 100%, indicating superb compliance with an average usage of 8 hours and 17 minutes, residual AHI at goal at 0.7/h, leak on the higher side with the 95th percentile at 21.7 L/min on a pressure of 7 cm with EPR of 2.  He reports doing well from the sleep apnea standpoint.  Unfortunately, he had a series of complications late last year when he was on Farxiga.  He had developed significant side effects including frequent urination, dehydration, hematuria, and eventually developed a anal/rectal abscess with  evidence of sepsis.  He was treated as an outpatient.  He had lost weight in the process and also lost blood.  He needed iron infusions.  He is slowly recuperated and feels much better now.  His wife had hip replacement surgery and is recuperating.  He is supposed to schedule his yearly physical.  He saw [facility] surgery for his perianal abscess.  REVIEW OF SYSTEMS: Out of a complete 14 system review of symptoms, the patient complains only of the following symptoms, and all other reviewed systems are negative.  See HPI  ALLERGIES: Allergies  Allergen Reactions   Farxiga [Dapagliflozin] Other (See Comments)    Infection and kidney involvement   Losartan Potassium     Other reaction(s): sexual dysfunction/ED    HOME MEDICATIONS: Outpatient Medications Prior to Visit  Medication Sig Dispense Refill   Accu-Chek FastClix Lancets MISC Check sugar daily     ACCU-CHEK GUIDE test strip USE TO TEST BLOOD SUGAR DAILY     amLODipine (NORVASC) 5 MG tablet 1 tablet Orally Once a day for 30 days     atorvastatin  (LIPITOR) 40 MG tablet Take 0.5 tablets (20 mg total) by mouth at bedtime. (Patient taking differently: Take 40 mg by mouth at bedtime.) 30 tablet 7   blood glucose meter kit and supplies KIT Dispense based on patient and insurance preference. Use up to four times daily as directed. (FOR ICD-9 250.00, 250.01). 1 each 0   clopidogrel  (PLAVIX ) 75 MG tablet Take 75 mg by mouth daily.      cyclobenzaprine  (FLEXERIL ) 10 MG tablet Take 1 tablet (10 mg total) by mouth every 8 (eight) hours as needed for muscle spasms. 30 tablet 0   ketoconazole  (NIZORAL ) 2 % cream Apply to both feet and between toes once daily for 6 weeks. 60 g 1   oxyCODONE -acetaminophen  (PERCOCET/ROXICET) 5-325 MG tablet Take 1 tablet by mouth 3 (three) times daily as needed.     pantoprazole  (PROTONIX ) 40 MG tablet Take 1 tablet (40 mg total) by mouth daily. 30 tablet 1   polyethylene glycol (MIRALAX  / GLYCOLAX ) packet Take  17 g by mouth 2 (two) times daily. (Patient taking differently: Take 17 g by mouth daily.)     Probiotic Product (PHILLIPS COLON HEALTH PO) Take by mouth.     sildenafil (VIAGRA) 100 MG tablet sildenafil 100 mg tablet  TAKE 1 TABLET BY MOUTH ONCE DAILY AS NEEDED     valsartan -hydrochlorothiazide  (DIOVAN -HCT) 320-25 MG tablet Take 1 tablet by mouth daily.     No facility-administered medications prior to visit.    PAST MEDICAL HISTORY: Past Medical History:  Diagnosis Date   Abdominal aortic ectasia (HCC)    Arthritis    Cervical spondylosis    Colon polyps    CVA (cerebral infarction) 2010   Diabetes mellitus    Dizziness    DVT (deep  venous thrombosis) (HCC)    bilateral   ED (erectile dysfunction)    Foot drop    H/O Bell's palsy    Hyperlipidemia    Hypertension    Obesity    OSA on CPAP    Raynauds syndrome    Restrictive lung disease    Spinal stenosis    Stroke (HCC)    Unsteady gait     PAST SURGICAL HISTORY: Past Surgical History:  Procedure Laterality Date   CARPAL TUNNEL RELEASE Bilateral    CERVICAL DISCECTOMY  2004   LOOP RECORDER INSERTION N/A 06/06/2019   Procedure: LOOP RECORDER INSERTION;  Surgeon: Jolly Needle, MD;  Location: [facility] INVASIVE [facility] LAB;  Service: Cardiovascular;  Laterality: N/A;   LUMBAR DISC SURGERY     x6   POSTERIOR CERVICAL FUSION/FORAMINOTOMY N/A 06/27/2016   Procedure: POSTERIOR CERVICAL FUSION C2-C5; C2-C4 Decompression;  Surgeon: Mort Ards, MD;  Location: [facility] OR;  Service: Orthopedics;  Laterality: N/A;    FAMILY HISTORY: Family History  Problem Relation Age of Onset   Dementia Mother    Diabetes Mother    Lung cancer Father    Multiple sclerosis Sister    Sleep apnea Brother    Hypertension Brother    Colon cancer Neg Hx    Stomach cancer Neg Hx    Esophageal cancer Neg Hx    Rectal cancer Neg Hx     SOCIAL HISTORY: Social History   Socioeconomic History   Marital status: Married    Spouse name: Proofreader    Number of children: 2   Years of education: 14   Highest education level: Not on file  Occupational History   Occupation: RETIRED    Comment: retired  Tobacco Use   Smoking status: Some Days    Current packs/day: 0.00    Types: Cigarettes, Cigars    Last attempt to quit: 01/21/2011    Years since quitting: 12.7   Smokeless tobacco: Never   Tobacco comments:    still smokes occasional cigars  Vaping Use   Vaping status: Never Used  Substance and Sexual Activity   Alcohol  use: No   Drug use: No   Sexual activity: Yes    Birth control/protection: None  Other Topics Concern   Not on file  Social History Narrative   Patient is right handed and resides with wife   Social Drivers of Corporate investment banker Strain: Not on file  Food Insecurity: Not on file  Transportation Needs: Not on file  Physical Activity: Not on file  Stress: Not on file  Social Connections: Not on file  Intimate Partner Violence: Not on file   PHYSICAL EXAM  There were no vitals filed for this visit.  There is no height or weight on file to calculate BMI.  Generalized: Well developed, in no acute distress  Via video visit, patient is alert and oriented, speech is clear and concise, noted to be seated for visit  DIAGNOSTIC DATA (LABS, IMAGING, TESTING) - I reviewed patient records, labs, notes, testing and imaging myself where available.  Lab Results  Component Value Date   WBC 6.9 12/22/2021   HGB 9.3 (L) 12/22/2021   HCT 28.1 (L) 12/22/2021   MCV 87.7 12/22/2021   PLT 260.0 12/22/2021      Component Value Date/Time   NA 136 06/04/2019 1725   K 5.5 (H) 06/04/2019 1725   CL 102 06/04/2019 1725   CO2 24 06/04/2019 1704   GLUCOSE 126 (H) 06/04/2019 1725  BUN 27 (H) 06/04/2019 1725   CREATININE 1.30 (H) 06/04/2019 1725   CALCIUM  9.7 06/04/2019 1704   PROT 8.3 12/22/2021 1537   ALBUMIN  4.1 12/22/2021 1537   AST 27 12/22/2021 1537   ALT 23 12/22/2021 1537   ALKPHOS 101 01/23/2023  1641   BILITOT 0.4 12/22/2021 1537   GFRNONAA 57 (L) 06/04/2019 1704   GFRAA >60 06/04/2019 1704   Lab Results  Component Value Date   CHOL 112 06/05/2019   HDL 38 (L) 06/05/2019   LDLCALC 56 06/05/2019   TRIG 92 06/05/2019   CHOLHDL 2.9 06/05/2019   Lab Results  Component Value Date   HGBA1C 7.4 (H) 06/05/2019   No results found for: VITAMINB12 No results found for: TSH  Jeanmarie Millet, AGNP-C, DNP 10/11/2023, 6:03 AM [facility] [address] [place], [place] [address] [phone]

## 2023-10-12 ENCOUNTER — Telehealth: Payer: Medicare Other | Admitting: Neurology

## 2023-10-20 ENCOUNTER — Ambulatory Visit (INDEPENDENT_AMBULATORY_CARE_PROVIDER_SITE_OTHER): Admitting: Neurology

## 2023-10-20 DIAGNOSIS — G4734 Idiopathic sleep related nonobstructive alveolar hypoventilation: Secondary | ICD-10-CM

## 2023-10-20 DIAGNOSIS — G4733 Obstructive sleep apnea (adult) (pediatric): Secondary | ICD-10-CM | POA: Diagnosis not present

## 2023-10-24 NOTE — Progress Notes (Signed)
 [facility] Summary Report / Loop Recorder

## 2023-10-24 NOTE — Addendum Note (Signed)
 Addended by: TAWNI DRILLING D on: 10/24/2023 09:16 AM   Modules accepted: Orders

## 2023-11-01 NOTE — Progress Notes (Signed)
 See procedure note.

## 2023-11-03 NOTE — Procedures (Signed)
 [facility]  HOME SLEEP TEST (SANSA) REPORT (Mail-Out Device):   STUDY DATE: 10/25/2023  DOB: 11-06-1950  MRN: [id number]  ORDERING CLINICIAN: True Mar, MD, PhD   REFERRING CLINICIAN: Lauraine Born, NP  CLINICAL INFORMATION/HISTORY: 73 year old male with an underlying medical history of stroke, hypertension, hyperlipidemia, diabetes, degenerative back disease, sleep apnea, and obesity, who presents for reevaluation of his obstructive sleep apnea.  He has been on CPAP of 7 cm with good tolerance, ongoing benefit and good apnea control.  He should be eligible for a new machine.  PATIENT'S LAST REPORTED EPWORTH SLEEPINESS SCORE (ESS): 0/24.  BMI (at the time of sleep clinic visit and/or test date): 34.2 kg/m  FINDINGS:   Study Protocol:    The SANSA single-point-of-skin-contact chest-worn sensor - an FDA cleared and DOT approved type 4 home sleep test device - measures eight physiological channels,  including blood oxygen saturation (measured via PPG [photoplethysmography]), EKG-derived heart rate, respiratory effort, chest movement (measured via accelerometer), snoring, body position, and actigraphy. The device is designed to be worn for up to 10 hours per study.   Sleep Summary:   Total Recording Time (hours, min): 6 hours, 30 min  Total Effective Sleep Time (hours, min):  3 hours, 15 min  Sleep Efficiency (%):    65%   Respiratory Indices:   Calculated sAHI (per hour):  7.1/hour         Oxygen Saturation Statistics:    Oxygen Saturation (%) Mean: 96.5%   Minimum oxygen saturation (%):                 51.5%   O2 Saturation Range (%): 51.5- 100%   Time below or at 88% saturation: 9 min   Pulse Rate Statistics:   Pulse Mean (bpm):    78/min    Pulse Range (67-102/min)   Snoring: Mild to loud  IMPRESSION/DIAGNOSES:   OSA (obstructive sleep apnea) Nocturnal hypoxemia   RECOMMENDATIONS:   This home sleep test confirms the patient's  diagnosis of obstructive sleep apnea with an overall AHI in the mild range at 7.1/h.  Keep in mind that the patient did not sleep very much during this test and there were some issues with oxygen sensor, however, there is evidence of nocturnal hypoxemia.  Ongoing treatment with a positive airway pressure device is recommended.  The patient has been compliant with his CPAP of 7 cm with good apnea control and good tolerance of treatment.  I recommend prescribing a new machine as he should be eligible.  I would keep the settings the same, mask of choice, sized to fit. A full night, in-lab PAP titration study may aid in improving proper treatment settings and with mask fit, if needed, down the road. Alternative treatments may include weight loss (where appropriate) along with avoidance of the supine sleep position (if possible), or an oral appliance in appropriate candidates.   Please note that untreated obstructive sleep apnea may carry additional perioperative morbidity. Patients with significant obstructive sleep apnea should receive perioperative PAP therapy and the surgeons and particularly the anesthesiologist should be informed of the diagnosis and the severity of the sleep disordered breathing. The patient should be cautioned not to drive, work at heights, or operate dangerous or heavy equipment when tired or sleepy. Review and reiteration of good sleep hygiene measures should be pursued with any patient. Other causes of the patient's symptoms, including circadian rhythm disturbances, an underlying mood disorder, medication effect and/or an underlying medical problem cannot be ruled  out based on this test. Clinical correlation is recommended.  The patient and his referring provider will be notified of the test results. The patient will be seen in follow up in sleep clinic at [facility], as necessary.  I certify that I have reviewed the raw data recording prior to the issuance of this report in accordance with the  standards of the American Academy of Sleep Medicine (AASM).    INTERPRETING PHYSICIAN:   True Mar, MD, PhD Medical Director, [facility]) Diplomat, ABPN (Neurology and Sleep)   [facility] [address] [place], [place] [address] [phone]

## 2023-11-04 ENCOUNTER — Ambulatory Visit: Payer: Self-pay | Admitting: Neurology

## 2023-11-04 DIAGNOSIS — G4733 Obstructive sleep apnea (adult) (pediatric): Secondary | ICD-10-CM

## 2023-11-04 NOTE — Telephone Encounter (Signed)
 Please call. Repeat HST to qualify for a new machine 10/25/23 showed continued OSA in the mild range at 7.1/h. Keep in mind that the patient did not sleep very much during this test and there were some issues with oxygen sensor, however, there is evidence of nocturnal hypoxemia. Ongoing treatment with a positive airway pressure device is recommended   I will order a new CPAP machine.  Orders Placed This Encounter  Procedures   For home use only DME continuous positive airway pressure (CPAP)

## 2023-11-06 ENCOUNTER — Ambulatory Visit (INDEPENDENT_AMBULATORY_CARE_PROVIDER_SITE_OTHER)

## 2023-11-06 DIAGNOSIS — I639 Cerebral infarction, unspecified: Secondary | ICD-10-CM

## 2023-11-06 LAB — CUP PACEART REMOTE DEVICE CHECK
Date Time Interrogation Session: 20250713230135
Implantable Pulse Generator Implant Date: 20210211

## 2023-11-06 NOTE — Telephone Encounter (Signed)
-----   Message from Lauraine JINNY Born, NP sent at 11/04/2023  7:37 AM EDT -----  Will need revisit 30-90 days of new CPAP machine. Thanks  ----- Message ----- From: Buck Saucer, MD Sent: 11/03/2023   1:40 PM EDT To: Lauraine JINNY Born, NP

## 2023-11-06 NOTE — Telephone Encounter (Signed)
 Cpap order sent to [facility]  [phone]

## 2023-11-09 DIAGNOSIS — M79604 Pain in right leg: Secondary | ICD-10-CM | POA: Insufficient documentation

## 2023-11-10 ENCOUNTER — Ambulatory Visit (HOSPITAL_COMMUNITY)
Admission: RE | Admit: 2023-11-10 | Discharge: 2023-11-10 | Disposition: A | Discharge status: Home / Self Care | Source: Ambulatory Visit | Attending: Vascular Surgery | Admitting: Vascular Surgery

## 2023-11-10 ENCOUNTER — Other Ambulatory Visit (HOSPITAL_COMMUNITY): Payer: Self-pay | Admitting: Physical Medicine and Rehabilitation

## 2023-11-10 DIAGNOSIS — R52 Pain, unspecified: Secondary | ICD-10-CM

## 2023-11-10 DIAGNOSIS — M79661 Pain in right lower leg: Secondary | ICD-10-CM | POA: Insufficient documentation

## 2023-11-10 DIAGNOSIS — M79662 Pain in left lower leg: Secondary | ICD-10-CM | POA: Insufficient documentation

## 2023-11-10 LAB — VAS US ABI WITH/WO TBI
Left ABI: 1.09
Right ABI: 1

## 2023-11-15 ENCOUNTER — Ambulatory Visit: Payer: Self-pay | Admitting: Cardiovascular Disease

## 2023-11-23 ENCOUNTER — Encounter: Payer: Self-pay | Admitting: Podiatry

## 2023-11-23 ENCOUNTER — Ambulatory Visit: Admitting: Podiatry

## 2023-11-23 DIAGNOSIS — E1142 Type 2 diabetes mellitus with diabetic polyneuropathy: Secondary | ICD-10-CM

## 2023-11-23 DIAGNOSIS — M79675 Pain in left toe(s): Secondary | ICD-10-CM | POA: Diagnosis not present

## 2023-11-23 DIAGNOSIS — L84 Corns and callosities: Secondary | ICD-10-CM

## 2023-11-23 DIAGNOSIS — M79674 Pain in right toe(s): Secondary | ICD-10-CM | POA: Diagnosis not present

## 2023-11-23 DIAGNOSIS — B351 Tinea unguium: Secondary | ICD-10-CM | POA: Diagnosis not present

## 2023-11-24 NOTE — Progress Notes (Signed)
 [facility] Summary Report / Loop Recorder

## 2023-11-25 NOTE — Progress Notes (Signed)
  Subjective:  Patient ID: Rodney Crane, male    DOB: 02-26-1951,  MRN: [id number]  [name] presents to clinic today for at risk foot care with history of diabetic neuropathy and callus(es) right foot and painful mycotic toenails that are difficult to trim. Painful toenails interfere with ambulation. Aggravating factors include wearing enclosed shoe gear. Pain is relieved with periodic professional debridement. Painful calluses are aggravated when weightbearing with and without shoegear. Pain is relieved with periodic professional debridement.  Chief Complaint  Patient presents with   [facility]    Rm2 Diabetic foot care/ Dr. Loreli last visit March 2025/ A1c 6.1 bloodsugar 125   New problem(s): None.   PCP is [name]., MD.  Allergies  Allergen Reactions   Doreen [Dapagliflozin] Other (See Comments)    Infection and kidney involvement   Losartan Potassium     Other reaction(s): sexual dysfunction/ED    Review of Systems: Negative except as noted in the HPI.  Objective: No changes noted in today's physical examination. There were no vitals filed for this visit. Londen Crane is a pleasant 73 y.o. male morbidly obese in NAD. AAO x 3.  Vascular Examination: Capillary refill time immediate b/l. Vascular status intact b/l with palpable pedal pulses. Pedal hair present b/l. No pain with calf compression b/l. Skin temperature gradient WNL b/l. No cyanosis or clubbing b/l. No ischemia or gangrene noted b/l. No edema noted b/l LE.  Neurological Examination: Sensation grossly intact b/l with 10 gram monofilament. Vibratory sensation intact b/l.   Dermatological Examination: Pedal skin with normal turgor, texture and tone b/l.  No open wounds. No interdigital macerations.   Toenails 1-5 b/l thick, discolored, elongated with subungual debris and pain on dorsal palpation.   Hyperkeratotic lesion(s) right heel.  No erythema, no edema, no drainage, no fluctuance.  Musculoskeletal  Examination: Dropfoot right lower extremity. Wearing AFO on right lower extremity. Muscle strength 5/5 to all lower extremity muscle groups bilaterally.  Radiographs: None  Assessment/Plan: 1. Pain due to onychomycosis of toenails of both feet   2. Callus   3. Diabetic polyneuropathy associated with type 2 diabetes mellitus (HCC)   Patient was evaluated and treated. All patient's and/or POA's questions/concerns addressed on today's visit. Mycotic toenails 1-5 debrided in length and girth without incident. Callus(es) right heel pared with sharp debridement without incident. Continue daily foot inspections and monitor blood glucose per PCP/Endocrinologist's recommendations. Continue soft, supportive shoe gear daily. Report any pedal injuries to medical professional. Call office if there are any questions/concerns.  Return in about 3 months (around 02/23/2024).  Delon LITTIE Merlin, DPM      [place] LOCATION: [address] [place] [address]                   Office [phone]   [facility] LOCATION: [address] [place], [place] [address] Office [phone]

## 2023-12-07 ENCOUNTER — Ambulatory Visit (INDEPENDENT_AMBULATORY_CARE_PROVIDER_SITE_OTHER)

## 2023-12-07 DIAGNOSIS — I639 Cerebral infarction, unspecified: Secondary | ICD-10-CM | POA: Diagnosis not present

## 2023-12-07 LAB — CUP PACEART REMOTE DEVICE CHECK
Date Time Interrogation Session: 20250813230933
Implantable Pulse Generator Implant Date: 20210211

## 2023-12-14 ENCOUNTER — Ambulatory Visit: Payer: Self-pay | Admitting: Cardiovascular Disease

## 2024-01-08 ENCOUNTER — Ambulatory Visit (INDEPENDENT_AMBULATORY_CARE_PROVIDER_SITE_OTHER)

## 2024-01-08 DIAGNOSIS — I639 Cerebral infarction, unspecified: Secondary | ICD-10-CM

## 2024-01-09 LAB — CUP PACEART REMOTE DEVICE CHECK
Date Time Interrogation Session: 20250913230550
Implantable Pulse Generator Implant Date: 20210211

## 2024-01-13 ENCOUNTER — Ambulatory Visit: Payer: Self-pay | Admitting: Cardiovascular Disease

## 2024-01-15 NOTE — Progress Notes (Signed)
 Remote Loop Recorder Transmission

## 2024-01-17 NOTE — Progress Notes (Signed)
 Remote Loop Recorder Transmission

## 2024-02-01 NOTE — Progress Notes (Signed)
 Remote Loop Recorder Transmission

## 2024-02-07 NOTE — Progress Notes (Unsigned)
 Patient: Rodney Crane Date of Birth: [birth year]  Reason for Visit: Follow up History from: Patient Primary Neurologist: Rodney Crane    ASSESSMENT AND PLAN 73 y.o. year old male   1.  OSA on CPAP (split night sleep study in 2015 He had a total AHI of 15.2 per hour, rising to 38 per hour in REM sleep)  2.  History of stroke 2021  -Has superb CPAP compliance and benefit.  He is interested in a new CPAP machine.  His was set up in July 2020.  I will order an updated HST.  Once this returns we will order a new CPAP.  Once he starts his new CPAP machine he will need follow-up within 30 to 90 days.  HISTORY OF PRESENT ILLNESS: Today 02/07/24 02/08/24 SS: Repeat HST to qualify for a new machine 10/25/23 showed continued OSA in the mild range at 7.1/h. Keep in mind that the patient did not sleep very much during this test and there were some issues with oxygen sensor, however, there is evidence of nocturnal hypoxemia. Ongoing treatment with a positive airway pressure device is recommended. CPAP setup new machine 11/28/23.   10/11/23 SS: Via [facility]V.  Here for CPAP revisit.  100% CPAP compliance.  AHI 0.7, leak 22.9.  Set pressure 7 cm water. Setup July 2020. No issues with CPAP. Still using cane with mobility issues. He has right AFO. Uses nasal pillow mask.  Would like a new CPAP.  10/13/22 SS: Here today for CPAP follow-up.  Continues to have superb compliance at 100%.  Set pressure 7 cm water.  EPR level 2.  AHI 0.9, leak 20.3. using nasal pillow mask. He likes this. The mask may reposition during his sleep, his wife wakes him up. Machine is doing well. Looks like his machine was setup July 2020. No major health issues. Using cane. Needs more exercise. Has right AFO. He doesn't drive due to foot drop.  ESS 3.  HISTORY  10/07/21 Dr. Buck: Rodney Crane is a 73 year old right-handed gentleman with an underlying medical history of hypertension, smoking, hyperlipidemia, type 2 diabetes, degenerative back disease  with s/p multiple back surgeries, L thalamic stroke and mild obesity, who presents for follow-up consultation of his stroke and sleep apnea, on CPAP therapy. The patient is unaccompanied today. He had to miss his appointment on 07/21/21 due to his wife's surgery. I last saw him on 07/20/2020, at which time he was fully compliant with his CPAP machine.  He was doing overall quite well.  He had no recent strokelike symptoms.  He did have a couple of falls with minor skin injuries and minor bruising thankfully only.  He had recent cataract surgery, he was diagnosed with carpal tunnel syndrome by orthopedics.  He was using an AFO for his right leg.  He was advised to follow-up routinely in 1 year.   Today, 10/07/2021: I reviewed his CPAP compliance data from 09/05/2021 through 10/04/2021, which is a total of 30 days, during which time he used his machine every night with percent use days greater than 4 hours at 100%, indicating superb compliance with an average usage of 7 hours and 59 minutes, residual AHI at goal at 0.6/h, leak on the high side with slight fluctuation with the 95th percentile at 23.6 L/min on a pressure of 7 cm with EPR of 2.   I reviewed his CPAP compliance data from 06/20/2021 through 07/19/2021, which is a total of 30 days, during which time he used his machine every  night with percent used days greater than 4 hours at 100%, indicating superb compliance with an average usage of 8 hours and 17 minutes, residual AHI at goal at 0.7/h, leak on the higher side with the 95th percentile at 21.7 L/min on a pressure of 7 cm with EPR of 2.  He reports doing well from the sleep apnea standpoint.  Unfortunately, he had a series of complications late last year when he was on Farxiga.  He had developed significant side effects including frequent urination, dehydration, hematuria, and eventually developed a anal/rectal abscess with evidence of sepsis.  He was treated as an outpatient.  He had lost weight in the  process and also lost blood.  He needed iron infusions.  He is slowly recuperated and feels much better now.  His wife had hip replacement surgery and is recuperating.  He is supposed to schedule his yearly physical.  He saw [facility] for his perianal abscess.  REVIEW OF SYSTEMS: Out of a complete 14 system review of symptoms, the patient complains only of the following symptoms, and all other reviewed systems are negative.  See HPI  ALLERGIES: Allergies  Allergen Reactions   Farxiga [Dapagliflozin] Other (See Comments)    Infection and kidney involvement   Losartan Potassium     Other reaction(s): sexual dysfunction/ED    HOME MEDICATIONS: Outpatient Medications Prior to Visit  Medication Sig Dispense Refill   Accu-Chek FastClix Lancets MISC Check sugar daily     ACCU-CHEK GUIDE test strip USE TO TEST BLOOD SUGAR DAILY     amLODipine (NORVASC) 5 MG tablet 1 tablet Orally Once a day for 30 days     atorvastatin  (LIPITOR) 40 MG tablet Take 0.5 tablets (20 mg total) by mouth at bedtime. (Patient taking differently: Take 40 mg by mouth at bedtime.) 30 tablet 7   blood glucose meter kit and supplies KIT Dispense based on patient and insurance preference. Use up to four times daily as directed. (FOR ICD-9 250.00, 250.01). 1 each 0   clopidogrel  (PLAVIX ) 75 MG tablet Take 75 mg by mouth daily.      cyclobenzaprine  (FLEXERIL ) 10 MG tablet Take 1 tablet (10 mg total) by mouth every 8 (eight) hours as needed for muscle spasms. 30 tablet 0   ketoconazole  (NIZORAL ) 2 % cream Apply to both feet and between toes once daily for 6 weeks. 60 g 1   oxyCODONE -acetaminophen  (PERCOCET/ROXICET) 5-325 MG tablet Take 1 tablet by mouth 3 (three) times daily as needed.     pantoprazole  (PROTONIX ) 40 MG tablet Take 1 tablet (40 mg total) by mouth daily. 30 tablet 1   polyethylene glycol (MIRALAX  / GLYCOLAX ) packet Take 17 g by mouth 2 (two) times daily. (Patient taking differently: Take 17 g by  mouth daily.)     Probiotic Product (PHILLIPS COLON HEALTH PO) Take by mouth.     sildenafil (VIAGRA) 100 MG tablet sildenafil 100 mg tablet  TAKE 1 TABLET BY MOUTH ONCE DAILY AS NEEDED     valsartan -hydrochlorothiazide  (DIOVAN -HCT) 320-25 MG tablet Take 1 tablet by mouth daily.     No facility-administered medications prior to visit.    PAST MEDICAL HISTORY: Past Medical History:  Diagnosis Date   Abdominal aortic ectasia    Arthritis    Cervical spondylosis    Colon polyps    CVA (cerebral infarction) 2010   Diabetes mellitus    Dizziness    DVT (deep venous thrombosis) (HCC)    bilateral   ED (erectile  dysfunction)    Foot drop    H/O Bell's palsy    Hyperlipidemia    Hypertension    Obesity    OSA on CPAP    Raynauds syndrome    Restrictive lung disease    Spinal stenosis    Stroke (HCC)    Unsteady gait     PAST SURGICAL HISTORY: Past Surgical History:  Procedure Laterality Date   CARPAL TUNNEL RELEASE Bilateral    CERVICAL DISCECTOMY  2004   LOOP RECORDER INSERTION N/A 06/06/2019   Procedure: LOOP RECORDER INSERTION;  Surgeon: Kelsie Agent, MD;  Location: [facility];  Service: Cardiovascular;  Laterality: N/A;   LUMBAR DISC SURGERY     x6   POSTERIOR CERVICAL FUSION/FORAMINOTOMY N/A 06/27/2016   Procedure: POSTERIOR CERVICAL FUSION C2-C5; C2-C4 Decompression;  Surgeon: Burnetta Aures, MD;  Location: [facility] OR;  Service: Orthopedics;  Laterality: N/A;    FAMILY HISTORY: Family History  Problem Relation Age of Onset   Dementia Mother    Diabetes Mother    Lung cancer Father    Multiple sclerosis Sister    Sleep apnea Brother    Hypertension Brother    Colon cancer Neg Hx    Stomach cancer Neg Hx    Esophageal cancer Neg Hx    Rectal cancer Neg Hx     SOCIAL HISTORY: Social History   Socioeconomic History   Marital status: Married    Spouse name: Proofreader   Number of children: 2   Years of education: 14   Highest education level: Not on  file  Occupational History   Occupation: RETIRED    Comment: retired  Tobacco Use   Smoking status: Some Days    Current packs/day: 0.00    Types: Cigarettes, Cigars    Last attempt to quit: 01/21/2011    Years since quitting: 13.0   Smokeless tobacco: Never   Tobacco comments:    still smokes occasional cigars  Vaping Use   Vaping status: Never Used  Substance and Sexual Activity   Alcohol  use: No   Drug use: No   Sexual activity: Yes    Birth control/protection: None  Other Topics Concern   Not on file  Social History Narrative   Patient is right handed and resides with wife   Social Drivers of Corporate investment banker Strain: Not on file  Food Insecurity: Not on file  Transportation Needs: Not on file  Physical Activity: Not on file  Stress: Not on file  Social Connections: Not on file  Intimate Partner Violence: Not on file   PHYSICAL EXAM  There were no vitals filed for this visit.  There is no height or weight on file to calculate BMI.  Generalized: Well developed, in no acute distress  Via video visit, patient is alert and oriented, speech is clear and concise, noted to be seated for visit  DIAGNOSTIC DATA (LABS, IMAGING, TESTING) - I reviewed patient records, labs, notes, testing and imaging myself where available.  Lab Results  Component Value Date   WBC 6.9 12/22/2021   HGB 9.3 (L) 12/22/2021   HCT 28.1 (L) 12/22/2021   MCV 87.7 12/22/2021   PLT 260.0 12/22/2021      Component Value Date/Time   NA 136 06/04/2019 1725   K 5.5 (H) 06/04/2019 1725   CL 102 06/04/2019 1725   CO2 24 06/04/2019 1704   GLUCOSE 126 (H) 06/04/2019 1725   BUN 27 (H) 06/04/2019 1725   CREATININE 1.30 (  H) 06/04/2019 1725   CALCIUM  9.7 06/04/2019 1704   PROT 8.3 12/22/2021 1537   ALBUMIN  4.1 12/22/2021 1537   AST 27 12/22/2021 1537   ALT 23 12/22/2021 1537   ALKPHOS 101 01/23/2023 1641   BILITOT 0.4 12/22/2021 1537   GFRNONAA 57 (L) 06/04/2019 1704   GFRAA >60  06/04/2019 1704   Lab Results  Component Value Date   CHOL 112 06/05/2019   HDL 38 (L) 06/05/2019   LDLCALC 56 06/05/2019   TRIG 92 06/05/2019   CHOLHDL 2.9 06/05/2019   Lab Results  Component Value Date   HGBA1C 7.4 (H) 06/05/2019   No results found for: VITAMINB12 No results found for: TSH  Lauraine Born, AGNP-C, DNP 02/07/2024, 2:46 PM [facility] [address] [place], [place] [address] [phone]

## 2024-02-08 ENCOUNTER — Ambulatory Visit: Admitting: Neurology

## 2024-02-08 ENCOUNTER — Encounter

## 2024-02-08 ENCOUNTER — Encounter: Payer: Self-pay | Admitting: Neurology

## 2024-02-08 VITALS — BP 123/84 | HR 88 | Ht 75.0 in | Wt 258.5 lb

## 2024-02-08 DIAGNOSIS — G4733 Obstructive sleep apnea (adult) (pediatric): Secondary | ICD-10-CM

## 2024-02-08 NOTE — Patient Instructions (Signed)
 Great to see you today! Continue CPAP usage minimum 4 hours nightly Continue current settings Continue to replace supplies routinely through DME Follow-up in 1 year or sooner if needed. Thanks!!

## 2024-02-09 ENCOUNTER — Ambulatory Visit (INDEPENDENT_AMBULATORY_CARE_PROVIDER_SITE_OTHER)

## 2024-02-09 DIAGNOSIS — I639 Cerebral infarction, unspecified: Secondary | ICD-10-CM

## 2024-02-11 LAB — CUP PACEART REMOTE DEVICE CHECK
Date Time Interrogation Session: 20251016230704
Implantable Pulse Generator Implant Date: 20210211

## 2024-02-15 NOTE — Progress Notes (Signed)
 Remote Loop Recorder Transmission

## 2024-02-19 ENCOUNTER — Ambulatory Visit: Payer: Self-pay | Admitting: Cardiovascular Disease

## 2024-02-22 ENCOUNTER — Ambulatory Visit: Admitting: Podiatry

## 2024-02-22 ENCOUNTER — Encounter: Payer: Self-pay | Admitting: Podiatry

## 2024-02-22 DIAGNOSIS — Z0189 Encounter for other specified special examinations: Secondary | ICD-10-CM

## 2024-02-22 DIAGNOSIS — B351 Tinea unguium: Secondary | ICD-10-CM | POA: Diagnosis not present

## 2024-02-22 DIAGNOSIS — L84 Corns and callosities: Secondary | ICD-10-CM

## 2024-02-22 DIAGNOSIS — L98499 Non-pressure chronic ulcer of skin of other sites with unspecified severity: Secondary | ICD-10-CM | POA: Insufficient documentation

## 2024-02-22 DIAGNOSIS — E1142 Type 2 diabetes mellitus with diabetic polyneuropathy: Secondary | ICD-10-CM

## 2024-02-22 DIAGNOSIS — M21371 Foot drop, right foot: Secondary | ICD-10-CM | POA: Diagnosis not present

## 2024-02-22 DIAGNOSIS — M79675 Pain in left toe(s): Secondary | ICD-10-CM | POA: Diagnosis not present

## 2024-02-22 DIAGNOSIS — N1832 Chronic kidney disease, stage 3b: Secondary | ICD-10-CM | POA: Insufficient documentation

## 2024-02-22 DIAGNOSIS — M79674 Pain in right toe(s): Secondary | ICD-10-CM | POA: Diagnosis not present

## 2024-02-22 DIAGNOSIS — E66811 Obesity, class 1: Secondary | ICD-10-CM | POA: Insufficient documentation

## 2024-02-22 DIAGNOSIS — E119 Type 2 diabetes mellitus without complications: Secondary | ICD-10-CM

## 2024-02-22 DIAGNOSIS — M2550 Pain in unspecified joint: Secondary | ICD-10-CM | POA: Insufficient documentation

## 2024-02-25 NOTE — Progress Notes (Signed)
  Subjective:  Patient ID: Rodney Crane, male    DOB: [birth year],  MRN: [id number]  Rodney Crane presents to clinic today for for annual diabetic foot examination, at risk foot care with history of diabetic neuropathy, and callus(es) right heel and painful thick toenails that are difficult to trim. Painful toenails interfere with ambulation. Aggravating factors include wearing enclosed shoe gear. Pain is relieved with periodic professional debridement. Painful calluses are aggravated when weightbearing with and without shoegear. Pain is relieved with periodic professional debridement.  Chief Complaint  Patient presents with   Toe Pain    Dr. Loreli is his PCP.  Was there in Sept. A1c 6.1   New problem(s): None.   PCP is Rodney Crane., MD.  Allergies  Allergen Reactions   Rodney Crane [Dapagliflozin] Other (See Comments)    Infection and kidney involvement   Losartan Potassium     Other reaction(s): sexual dysfunction/ED    Review of Systems: Negative except as noted in the HPI.  Objective: No changes noted in today's physical examination. There were no vitals filed for this visit. Rodney Crane is a pleasant 73 y.o. male in NAD. AAO x 3.  Vascular Examination: Capillary refill time immediate b/l. Palpable pedal pulses. Pedal hair present b/l. Pedal edema absent. No pain with calf compression b/l. Skin temperature gradient WNL b/l. No cyanosis or clubbing b/l. No ischemia or gangrene noted b/l.   Neurological Examination: Sensation grossly intact b/l with 10 gram monofilament. Vibratory sensation intact b/l.   Dermatological Examination: Pedal skin with normal turgor, texture and tone b/l.  No open wounds. No interdigital macerations.   Toenails 1-5 b/l thick, discolored, elongated with subungual debris and pain on dorsal palpation.   Hyperkeratotic lesion(s) posteromedial aspect of right heel and 5th metatarsal head right foot.  No erythema, no edema, no drainage, no  fluctuance.  Musculoskeletal Examination: Dropfoot right lower extremity. Wearing AFO on right foot. Muscle strength 5/5 to all LE muscle groups of left lower extremity.  Radiographs: None  Assessment/Plan: 1. Pain due to onychomycosis of toenails of both feet   2. Callus   3. Right foot drop   4. Diabetic polyneuropathy associated with type 2 diabetes mellitus (HCC)   5. Encounter for diabetic foot exam (HCC)   Diabetic foot examination performed. Consent given for treatment. Patient examined.All patient's and/or POA's questions/concerns addressed on today's visit. Toenails 1-5 b/l debrided in length and girth without incident. Callus(es) right heel and 5th metatarsal head right foot pared with sharp debridement without incident. Continue foot and shoe inspections daily. Monitor blood glucose per PCP/Endocrinologist's recommendations.Continue soft, supportive shoe gear daily. Report any pedal injuries to medical professional. Call office if there are any questions/concerns.  Return in about 3 months (around 05/24/2024).  Rodney Crane, DPM      [facility] LOCATION: [address], [place] [address]                   Office [phone]   [facility] LOCATION: [address] [place], [place] [address] Office [phone]

## 2024-03-11 ENCOUNTER — Ambulatory Visit (INDEPENDENT_AMBULATORY_CARE_PROVIDER_SITE_OTHER)

## 2024-03-11 DIAGNOSIS — I639 Cerebral infarction, unspecified: Secondary | ICD-10-CM

## 2024-03-11 LAB — CUP PACEART REMOTE DEVICE CHECK
Date Time Interrogation Session: 20251116231013
Implantable Pulse Generator Implant Date: 20210211

## 2024-03-13 NOTE — Progress Notes (Signed)
 Remote Loop Recorder Transmission

## 2024-03-25 ENCOUNTER — Ambulatory Visit: Payer: Self-pay | Admitting: Cardiovascular Disease

## 2024-04-11 ENCOUNTER — Encounter

## 2024-04-11 DIAGNOSIS — I639 Cerebral infarction, unspecified: Secondary | ICD-10-CM | POA: Diagnosis not present

## 2024-04-11 LAB — CUP PACEART REMOTE DEVICE CHECK
Date Time Interrogation Session: 20251217230317
Implantable Pulse Generator Implant Date: 20210211

## 2024-04-15 NOTE — Progress Notes (Signed)
 Remote Loop Recorder Transmission

## 2024-04-24 ENCOUNTER — Ambulatory Visit: Payer: Self-pay | Admitting: Cardiovascular Disease

## 2024-05-12 ENCOUNTER — Ambulatory Visit

## 2024-05-12 DIAGNOSIS — I639 Cerebral infarction, unspecified: Secondary | ICD-10-CM | POA: Diagnosis not present

## 2024-05-13 LAB — CUP PACEART REMOTE DEVICE CHECK
Date Time Interrogation Session: 20260117230038
Implantable Pulse Generator Implant Date: 20210211

## 2024-05-17 NOTE — Progress Notes (Signed)
 Remote Loop Recorder Transmission

## 2024-05-18 ENCOUNTER — Ambulatory Visit: Payer: Self-pay | Admitting: Cardiovascular Disease

## 2024-06-06 ENCOUNTER — Ambulatory Visit: Admitting: Podiatry

## 2024-06-12 ENCOUNTER — Encounter

## 2024-07-13 ENCOUNTER — Encounter

## 2025-02-13 ENCOUNTER — Ambulatory Visit: Admitting: Neurology
